# Patient Record
Sex: Female | Born: 1955 | Race: White | Hispanic: No | Marital: Married | Smoking: Never smoker
Health system: Southern US, Community
[De-identification: ages and names within clinical notes are randomized; demographics above are authoritative.]

## PROBLEM LIST (undated history)

## (undated) DIAGNOSIS — R51 Headache: Secondary | ICD-10-CM

## (undated) DIAGNOSIS — Z9889 Other specified postprocedural states: Secondary | ICD-10-CM

## (undated) DIAGNOSIS — F329 Major depressive disorder, single episode, unspecified: Secondary | ICD-10-CM

## (undated) DIAGNOSIS — F419 Anxiety disorder, unspecified: Secondary | ICD-10-CM

## (undated) DIAGNOSIS — J45909 Unspecified asthma, uncomplicated: Secondary | ICD-10-CM

## (undated) DIAGNOSIS — R112 Nausea with vomiting, unspecified: Secondary | ICD-10-CM

## (undated) DIAGNOSIS — G43909 Migraine, unspecified, not intractable, without status migrainosus: Secondary | ICD-10-CM

## (undated) DIAGNOSIS — F32A Depression, unspecified: Secondary | ICD-10-CM

## (undated) DIAGNOSIS — T7840XA Allergy, unspecified, initial encounter: Secondary | ICD-10-CM

## (undated) DIAGNOSIS — K219 Gastro-esophageal reflux disease without esophagitis: Secondary | ICD-10-CM

## (undated) DIAGNOSIS — M1990 Unspecified osteoarthritis, unspecified site: Secondary | ICD-10-CM

## (undated) DIAGNOSIS — R519 Headache, unspecified: Secondary | ICD-10-CM

## (undated) DIAGNOSIS — I10 Essential (primary) hypertension: Secondary | ICD-10-CM

## (undated) DIAGNOSIS — L4050 Arthropathic psoriasis, unspecified: Secondary | ICD-10-CM

## (undated) HISTORY — PX: TENDON REPAIR: SHX5111

## (undated) HISTORY — DX: Migraine, unspecified, not intractable, without status migrainosus: G43.909

## (undated) HISTORY — DX: Arthropathic psoriasis, unspecified: L40.50

## (undated) HISTORY — PX: EYE SURGERY: SHX253

## (undated) HISTORY — PX: JOINT REPLACEMENT: SHX530

## (undated) HISTORY — DX: Allergy, unspecified, initial encounter: T78.40XA

## (undated) HISTORY — DX: Headache, unspecified: R51.9

## (undated) HISTORY — DX: Unspecified osteoarthritis, unspecified site: M19.90

## (undated) HISTORY — DX: Anxiety disorder, unspecified: F41.9

## (undated) HISTORY — PX: TUBAL LIGATION: SHX77

## (undated) HISTORY — DX: Depression, unspecified: F32.A

## (undated) HISTORY — DX: Headache: R51

## (undated) HISTORY — DX: Major depressive disorder, single episode, unspecified: F32.9

## (undated) HISTORY — PX: BRAIN SURGERY: SHX531

---

## 1983-05-07 HISTORY — PX: TONSILLECTOMY AND ADENOIDECTOMY: SUR1326

## 1993-05-06 DIAGNOSIS — G919 Hydrocephalus, unspecified: Secondary | ICD-10-CM

## 1993-05-06 HISTORY — PX: VENTRICULAR ATRIAL SHUNT: SHX2657

## 1993-05-06 HISTORY — DX: Hydrocephalus, unspecified: G91.9

## 1996-05-06 HISTORY — PX: TYMPANOSTOMY TUBE PLACEMENT: SHX32

## 1996-05-06 HISTORY — PX: NASAL SINUS SURGERY: SHX719

## 1998-05-06 HISTORY — PX: BREAST SURGERY: SHX581

## 2011-07-01 DIAGNOSIS — Z87891 Personal history of nicotine dependence: Secondary | ICD-10-CM | POA: Diagnosis not present

## 2011-07-01 DIAGNOSIS — M25549 Pain in joints of unspecified hand: Secondary | ICD-10-CM | POA: Diagnosis not present

## 2011-07-01 DIAGNOSIS — R229 Localized swelling, mass and lump, unspecified: Secondary | ICD-10-CM | POA: Diagnosis not present

## 2011-07-01 DIAGNOSIS — M19049 Primary osteoarthritis, unspecified hand: Secondary | ICD-10-CM | POA: Diagnosis not present

## 2011-07-10 DIAGNOSIS — R229 Localized swelling, mass and lump, unspecified: Secondary | ICD-10-CM | POA: Diagnosis not present

## 2011-07-12 DIAGNOSIS — D481 Neoplasm of uncertain behavior of connective and other soft tissue: Secondary | ICD-10-CM | POA: Diagnosis not present

## 2011-07-12 DIAGNOSIS — R229 Localized swelling, mass and lump, unspecified: Secondary | ICD-10-CM | POA: Diagnosis not present

## 2011-07-12 DIAGNOSIS — M659 Synovitis and tenosynovitis, unspecified: Secondary | ICD-10-CM | POA: Diagnosis not present

## 2011-07-12 DIAGNOSIS — M65849 Other synovitis and tenosynovitis, unspecified hand: Secondary | ICD-10-CM | POA: Diagnosis not present

## 2011-07-12 DIAGNOSIS — Z79899 Other long term (current) drug therapy: Secondary | ICD-10-CM | POA: Diagnosis not present

## 2011-07-12 DIAGNOSIS — M65839 Other synovitis and tenosynovitis, unspecified forearm: Secondary | ICD-10-CM | POA: Diagnosis not present

## 2011-09-25 DIAGNOSIS — Z1231 Encounter for screening mammogram for malignant neoplasm of breast: Secondary | ICD-10-CM | POA: Diagnosis not present

## 2011-09-25 DIAGNOSIS — J309 Allergic rhinitis, unspecified: Secondary | ICD-10-CM | POA: Diagnosis not present

## 2011-09-25 DIAGNOSIS — Z79899 Other long term (current) drug therapy: Secondary | ICD-10-CM | POA: Diagnosis not present

## 2011-09-25 DIAGNOSIS — K219 Gastro-esophageal reflux disease without esophagitis: Secondary | ICD-10-CM | POA: Diagnosis not present

## 2011-09-25 DIAGNOSIS — F341 Dysthymic disorder: Secondary | ICD-10-CM | POA: Diagnosis not present

## 2011-09-25 DIAGNOSIS — E785 Hyperlipidemia, unspecified: Secondary | ICD-10-CM | POA: Diagnosis not present

## 2012-05-19 DIAGNOSIS — L4050 Arthropathic psoriasis, unspecified: Secondary | ICD-10-CM | POA: Diagnosis not present

## 2012-05-19 DIAGNOSIS — J328 Other chronic sinusitis: Secondary | ICD-10-CM | POA: Diagnosis not present

## 2012-08-12 DIAGNOSIS — S61209A Unspecified open wound of unspecified finger without damage to nail, initial encounter: Secondary | ICD-10-CM | POA: Diagnosis not present

## 2012-08-26 DIAGNOSIS — C753 Malignant neoplasm of pineal gland: Secondary | ICD-10-CM | POA: Diagnosis not present

## 2012-09-03 DIAGNOSIS — J019 Acute sinusitis, unspecified: Secondary | ICD-10-CM | POA: Diagnosis not present

## 2012-09-03 DIAGNOSIS — H7290 Unspecified perforation of tympanic membrane, unspecified ear: Secondary | ICD-10-CM | POA: Diagnosis not present

## 2012-09-16 DIAGNOSIS — J019 Acute sinusitis, unspecified: Secondary | ICD-10-CM | POA: Diagnosis not present

## 2012-09-16 DIAGNOSIS — H7290 Unspecified perforation of tympanic membrane, unspecified ear: Secondary | ICD-10-CM | POA: Diagnosis not present

## 2012-09-24 DIAGNOSIS — H7290 Unspecified perforation of tympanic membrane, unspecified ear: Secondary | ICD-10-CM | POA: Diagnosis not present

## 2012-09-24 DIAGNOSIS — J019 Acute sinusitis, unspecified: Secondary | ICD-10-CM | POA: Diagnosis not present

## 2012-10-06 DIAGNOSIS — Z0000 Encounter for general adult medical examination without abnormal findings: Secondary | ICD-10-CM | POA: Diagnosis not present

## 2012-10-06 DIAGNOSIS — J322 Chronic ethmoidal sinusitis: Secondary | ICD-10-CM | POA: Diagnosis not present

## 2012-10-06 DIAGNOSIS — H7200 Central perforation of tympanic membrane, unspecified ear: Secondary | ICD-10-CM | POA: Diagnosis not present

## 2012-10-12 DIAGNOSIS — F341 Dysthymic disorder: Secondary | ICD-10-CM | POA: Diagnosis not present

## 2012-11-05 DIAGNOSIS — S8010XA Contusion of unspecified lower leg, initial encounter: Secondary | ICD-10-CM | POA: Diagnosis not present

## 2012-11-05 DIAGNOSIS — W2203XA Walked into furniture, initial encounter: Secondary | ICD-10-CM | POA: Diagnosis not present

## 2012-11-30 DIAGNOSIS — L821 Other seborrheic keratosis: Secondary | ICD-10-CM | POA: Diagnosis not present

## 2012-11-30 DIAGNOSIS — L819 Disorder of pigmentation, unspecified: Secondary | ICD-10-CM | POA: Diagnosis not present

## 2013-02-11 ENCOUNTER — Ambulatory Visit (INDEPENDENT_AMBULATORY_CARE_PROVIDER_SITE_OTHER): Payer: Medicare Other | Admitting: Adult Health

## 2013-02-11 ENCOUNTER — Encounter (INDEPENDENT_AMBULATORY_CARE_PROVIDER_SITE_OTHER): Payer: Self-pay

## 2013-02-11 ENCOUNTER — Encounter: Payer: Self-pay | Admitting: Adult Health

## 2013-02-11 VITALS — BP 120/72 | HR 78 | Temp 98.4°F | Resp 12 | Ht 65.5 in | Wt 179.5 lb

## 2013-02-11 DIAGNOSIS — J329 Chronic sinusitis, unspecified: Secondary | ICD-10-CM | POA: Diagnosis not present

## 2013-02-11 DIAGNOSIS — R635 Abnormal weight gain: Secondary | ICD-10-CM | POA: Insufficient documentation

## 2013-02-11 DIAGNOSIS — Z1239 Encounter for other screening for malignant neoplasm of breast: Secondary | ICD-10-CM | POA: Insufficient documentation

## 2013-02-11 MED ORDER — PHENTERMINE HCL 37.5 MG PO CAPS: 37.5000 mg | ORAL_CAPSULE | ORAL | Status: DC

## 2013-02-11 MED ORDER — VALACYCLOVIR HCL 1 G PO TABS: 1000.0000 mg | ORAL_TABLET | Freq: Three times a day (TID) | ORAL | Status: DC | PRN

## 2013-02-11 NOTE — Assessment & Plan Note (Signed)
Mammogram ordered

## 2013-02-11 NOTE — Assessment & Plan Note (Signed)
Reports seasonal allergy. Not currently taking any antihistamines. She will try an over-the-counter medication such as Claritin, Allegra or Zyrtec.

## 2013-02-11 NOTE — Patient Instructions (Signed)
   Thank you for choosing [place] at [facility] for your health care needs.  I will request your medical records from your previous provider.  For weight loss assistance I have prescribed phentermine 37.5 mg tablet. Take this daily in the morning.  Return in 1 month prior to running out of medication.

## 2013-02-11 NOTE — Assessment & Plan Note (Signed)
Patient will like assistance with weight loss. Will start phentermine 37.5 mg. Return to clinic in one month for followup.

## 2013-02-11 NOTE — Progress Notes (Signed)
Subjective:    Patient ID: Michele Newton, female    DOB: [birth year], 57 y.o.   MRN: [id number]  HPI  Patient is a pleasant 57 y/o female who presents to clinic to establish care. Previously followed by Lazarus Gowda, PA. Will request records from previous provider. She reports recent weight gain and would like to start working towards getting it off. She also reports having problems with her sinuses. Feels that some of her symptoms are triggered by allergies. She is feeling well today.     Past Medical History  Diagnosis Date  . Arthritis   . Depression   . Frequent headaches   . Migraine      Past Surgical History  Procedure Laterality Date  . Breast surgery  2000    biopsy  . Tonsillectomy and adenoidectomy  1985  . Ventricular atrial shunt Right 1995  . Tympanostomy tube placement  1998  . Nasal sinus surgery  1998     Family History  Problem Relation Age of Onset  . Arthritis Mother   . Hyperlipidemia Mother   . Heart disease Mother 106    Died of heart failure  . Hypertension Mother   . Diabetes Mother   . Arthritis Father   . Cancer Father     prostate cancer  . Diabetes Brother      History   Social History  . Marital Status: Married    Spouse Name: Michele Newton    Number of Children: 2  . Years of Education: 14   Occupational History  .  Other   Social History Main Topics  . Smoking status: Never Smoker   . Smokeless tobacco: Never Used  . Alcohol Use: No  . Drug Use: No  . Sexual Activity: Yes   Other Topics Concern  . Not on file   Social History Narrative   Cristie lives at home with her husband, Michele Newton, of 5 months and a cat named, Michele Newton. She has 2 sons from a previous marriage. She has 4 grandchildren.      Review of Systems  Constitutional: Negative.   HENT: Positive for postnasal drip, rhinorrhea and sinus pressure.   Eyes: Negative.   Respiratory: Negative.   Cardiovascular: Negative.   Gastrointestinal: Negative.   Endocrine: Negative.    Genitourinary: Negative.   Musculoskeletal: Negative.   Skin: Negative.   Allergic/Immunologic: Negative.   Neurological: Positive for headaches.  Hematological: Negative.   Psychiatric/Behavioral: Negative.        Objective:   Physical Exam  Constitutional: She is oriented to person, place, and time. She appears well-developed and well-nourished. No distress.  HENT:  Head: Normocephalic and atraumatic.  Right Ear: External ear normal.  Left Ear: External ear normal.  Nose: Nose normal.  Mouth/Throat: Oropharynx is clear and moist.  Eyes: Conjunctivae and EOM are normal. Pupils are equal, round, and reactive to light.  Neck: Normal range of motion. Neck supple. No tracheal deviation present. No thyromegaly present.  Cardiovascular: Normal rate, regular rhythm, normal heart sounds and intact distal pulses.  Exam reveals no gallop and no friction rub.   No murmur heard. Pulmonary/Chest: Effort normal and breath sounds normal. No respiratory distress. She has no wheezes. She has no rales.  Abdominal: Soft. Bowel sounds are normal. She exhibits no distension and no mass. There is no tenderness. There is no rebound and no guarding.  Musculoskeletal: Normal range of motion. She exhibits no edema and no tenderness.  Lymphadenopathy:    She has no  cervical adenopathy.  Neurological: She is alert and oriented to person, place, and time. She has normal reflexes. No cranial nerve deficit. Coordination normal.  Skin: Skin is warm and dry.  Psychiatric: She has a normal mood and affect. Her behavior is normal. Judgment and thought content normal.    BP 120/72  Pulse 78  Temp(Src) 98.4 F (36.9 C) (Oral)  Resp 12  Ht 5' 5.5" (1.664 m)  Wt 179 lb 8 oz (81.421 kg)  BMI 29.41 kg/m2  SpO2 96%       Assessment & Plan:

## 2013-03-11 ENCOUNTER — Other Ambulatory Visit: Payer: Self-pay

## 2013-03-12 ENCOUNTER — Other Ambulatory Visit: Payer: Self-pay | Admitting: *Deleted

## 2013-03-12 MED ORDER — DESVENLAFAXINE SUCCINATE ER 100 MG PO TB24: 100.0000 mg | ORAL_TABLET | Freq: Every day | ORAL | Status: DC

## 2013-03-16 ENCOUNTER — Ambulatory Visit: Payer: Medicare Other | Admitting: Adult Health

## 2013-04-16 ENCOUNTER — Ambulatory Visit (INDEPENDENT_AMBULATORY_CARE_PROVIDER_SITE_OTHER): Payer: Medicare Other | Admitting: Internal Medicine

## 2013-04-16 ENCOUNTER — Encounter: Payer: Self-pay | Admitting: Internal Medicine

## 2013-04-16 VITALS — BP 126/80 | HR 112 | Temp 98.4°F | Wt 182.0 lb

## 2013-04-16 DIAGNOSIS — H6691 Otitis media, unspecified, right ear: Secondary | ICD-10-CM | POA: Insufficient documentation

## 2013-04-16 DIAGNOSIS — H6690 Otitis media, unspecified, unspecified ear: Secondary | ICD-10-CM

## 2013-04-16 DIAGNOSIS — J069 Acute upper respiratory infection, unspecified: Secondary | ICD-10-CM | POA: Diagnosis not present

## 2013-04-16 MED ORDER — BENZONATATE 200 MG PO CAPS: 200.0000 mg | ORAL_CAPSULE | Freq: Two times a day (BID) | ORAL | Status: DC | PRN

## 2013-04-16 MED ORDER — AMOXICILLIN-POT CLAVULANATE 875-125 MG PO TABS: 1.0000 | ORAL_TABLET | Freq: Two times a day (BID) | ORAL | Status: DC

## 2013-04-16 NOTE — Assessment & Plan Note (Signed)
Symptoms consistent with viral upper respiratory infection with cough. Encouraged rest and adequate fluid intake. Encouraged use Tessalon as needed for cough. Followup if symptoms are not improving.

## 2013-04-16 NOTE — Assessment & Plan Note (Signed)
Exam consistent with right otitis media with a small anterior perforation. Will treat with Augmentin. Patient will call if persistent symptoms of ear pain or fever.

## 2013-04-16 NOTE — Progress Notes (Signed)
Pre visit review using our clinic review tool, if applicable. No additional management support is needed unless otherwise documented below in the visit note. 

## 2013-04-16 NOTE — Progress Notes (Signed)
Subjective:    Patient ID: Michele Newton, female    DOB: January 04, 1956, 57 y.o.   MRN: [id number]  HPI 57 year old female presents for acute visit complaining of one-week history of cough, nasal congestion. Symptoms began last week with clear nasal drainage, general malaise, subjective fever and chills, and nonproductive cough. Symptoms have gradually improved over the last week however she does have some residual dry cough. She denies shortness of breath or recurrent fever. She denies chest pain. She notes some right ear pain. In the past, she was told she has a perforation in her right eardrum. She denies drainage from her ear. She is not currently taking any medications for symptoms.  Outpatient Encounter Prescriptions as of 04/16/2013  Medication Sig  . desvenlafaxine (PRISTIQ) 100 MG 24 hr tablet Take 1 tablet (100 mg total) by mouth daily.  . Multiple Vitamins-Minerals (WOMENS MULTIVITAMIN PLUS PO) Take by mouth.  . phentermine 37.5 MG capsule Take 1 capsule (37.5 mg total) by mouth every morning.  Marland Kitchen QUEtiapine (SEROQUEL) 200 MG tablet Take 200 mg by mouth at bedtime.  . valACYclovir (VALTREX) 1000 MG tablet Take 1 tablet (1,000 mg total) by mouth 3 (three) times daily as needed. X 7 days prn   BP 126/80  Pulse 112  Temp(Src) 98.4 F (36.9 C) (Oral)  Wt 182 lb (82.555 kg)  SpO2 97%  Review of Systems  Constitutional: Positive for fatigue. Negative for fever, chills, appetite change and unexpected weight change.  HENT: Positive for congestion, ear pain, postnasal drip and rhinorrhea. Negative for ear discharge, sinus pressure, sore throat, trouble swallowing and voice change.   Eyes: Negative for visual disturbance.  Respiratory: Positive for cough. Negative for shortness of breath, wheezing and stridor.   Cardiovascular: Negative for chest pain, palpitations and leg swelling.  Gastrointestinal: Negative for nausea, vomiting, abdominal pain, diarrhea, constipation, blood in stool,  abdominal distention and anal bleeding.  Genitourinary: Negative for dysuria and flank pain.  Musculoskeletal: Negative for arthralgias, gait problem, myalgias and neck pain.  Skin: Negative for color change and rash.  Neurological: Negative for dizziness and headaches.  Hematological: Negative for adenopathy. Does not bruise/bleed easily.  Psychiatric/Behavioral: Negative for suicidal ideas, sleep disturbance and dysphoric mood. The patient is not nervous/anxious.        Objective:   Physical Exam  Constitutional: She is oriented to person, place, and time. She appears well-developed and well-nourished. No distress.  HENT:  Head: Normocephalic and atraumatic.  Right Ear: External ear normal. A middle ear effusion is present.  Left Ear: Tympanic membrane, external ear and ear canal normal.  Ears:  Nose: Nose normal.  Mouth/Throat: Oropharynx is clear and moist. No oropharyngeal exudate.  Eyes: Conjunctivae are normal. Pupils are equal, round, and reactive to light. Right eye exhibits no discharge. Left eye exhibits no discharge. No scleral icterus.  Neck: Normal range of motion. Neck supple. No tracheal deviation present. No thyromegaly present.  Cardiovascular: Normal rate, regular rhythm, normal heart sounds and intact distal pulses.  Exam reveals no gallop and no friction rub.   No murmur heard. Pulmonary/Chest: Effort normal and breath sounds normal. No accessory muscle usage. Not tachypneic. No respiratory distress. She has no decreased breath sounds. She has no wheezes. She has no rhonchi. She has no rales. She exhibits no tenderness.  Musculoskeletal: Normal range of motion. She exhibits no edema and no tenderness.  Lymphadenopathy:    She has no cervical adenopathy.  Neurological: She is alert and oriented to person, place,  and time. No cranial nerve deficit. She exhibits normal muscle tone. Coordination normal.  Skin: Skin is warm and dry. No rash noted. She is not diaphoretic.  No erythema. No pallor.  Psychiatric: She has a normal mood and affect. Her behavior is normal. Judgment and thought content normal.          Assessment & Plan:

## 2013-04-22 ENCOUNTER — Telehealth: Payer: Self-pay | Admitting: Adult Health

## 2013-04-22 NOTE — Telephone Encounter (Signed)
QUEtiapine (SEROQUEL) 200 MG tablet  The patient is needing the prescription before the weekend.

## 2013-04-23 NOTE — Telephone Encounter (Signed)
Patient should contact her pharmacy

## 2013-04-26 MED ORDER — QUETIAPINE FUMARATE 200 MG PO TABS: 200.0000 mg | ORAL_TABLET | Freq: Every day | ORAL | Status: DC

## 2013-04-26 NOTE — Telephone Encounter (Signed)
Rx sent to the pharmacy.

## 2013-04-27 ENCOUNTER — Ambulatory Visit (INDEPENDENT_AMBULATORY_CARE_PROVIDER_SITE_OTHER): Payer: Medicare Other | Admitting: Internal Medicine

## 2013-04-27 ENCOUNTER — Encounter: Payer: Self-pay | Admitting: Internal Medicine

## 2013-04-27 VITALS — BP 120/86 | HR 97 | Temp 98.4°F | Wt 181.0 lb

## 2013-04-27 DIAGNOSIS — H6691 Otitis media, unspecified, right ear: Secondary | ICD-10-CM

## 2013-04-27 DIAGNOSIS — H6690 Otitis media, unspecified, unspecified ear: Secondary | ICD-10-CM

## 2013-04-27 DIAGNOSIS — J209 Acute bronchitis, unspecified: Secondary | ICD-10-CM | POA: Diagnosis not present

## 2013-04-27 DIAGNOSIS — J312 Chronic pharyngitis: Secondary | ICD-10-CM

## 2013-04-27 MED ORDER — DOXYCYCLINE HYCLATE 100 MG PO TABS: 100.0000 mg | ORAL_TABLET | Freq: Two times a day (BID) | ORAL | Status: DC

## 2013-04-27 MED ORDER — PREDNISONE (PAK) 10 MG PO TABS: ORAL_TABLET | ORAL | Status: DC

## 2013-04-27 NOTE — Assessment & Plan Note (Addendum)
Symptoms and exam are consistent with bronchitis. No improvement after recent course of augmentin. Will start Doxycycline. Start prednisone taper. Follow up for recheck in 2 weeks or sooner if needed. Continue Tessalon as needed for cough.

## 2013-04-27 NOTE — Assessment & Plan Note (Signed)
Right ear exam slightly improved after course of Augmentin, however some persistent erythema/inflammation. Will start course of prednisone and doxycycine. If no improvement, would favor ENT evaluation.

## 2013-04-27 NOTE — Progress Notes (Signed)
Pre-visit discussion using our clinic review tool. No additional management support is needed unless otherwise documented below in the visit note.  

## 2013-04-27 NOTE — Progress Notes (Signed)
Subjective:    Patient ID: Michele Newton, female    DOB: 1955/08/13, 57 y.o.   MRN: [id number]  HPI 57YO female with h/o sinusitis presents for acute visit. Seen initially 12/12 diagnosed with right OM. Treated with augmentin. Symptoms of ear pain improved, however over last few days, she has developed recurrent ear pain, sinus congestion with purulent drainage, sinus pressure and cough. Occasional mild dyspnea. No chest pain. Chills, but no fever noted.  Outpatient Encounter Prescriptions as of 04/27/2013  Medication Sig  . benzonatate (TESSALON) 200 MG capsule Take 1 capsule (200 mg total) by mouth 2 (two) times daily as needed for cough.  . desvenlafaxine (PRISTIQ) 100 MG 24 hr tablet Take 1 tablet (100 mg total) by mouth daily.  Marland Kitchen DEXILANT 60 MG capsule   . Multiple Vitamins-Minerals (WOMENS MULTIVITAMIN PLUS PO) Take by mouth.  . QUEtiapine (SEROQUEL) 200 MG tablet Take 1 tablet (200 mg total) by mouth at bedtime.  . valACYclovir (VALTREX) 1000 MG tablet Take 1 tablet (1,000 mg total) by mouth 3 (three) times daily as needed. X 7 days prn  . [DISCONTINUED] amoxicillin-clavulanate (AUGMENTIN) 875-125 MG per tablet Take 1 tablet by mouth 2 (two) times daily.  . phentermine 37.5 MG capsule Take 1 capsule (37.5 mg total) by mouth every morning.   BP 120/86  Pulse 97  Temp(Src) 98.4 F (36.9 C) (Oral)  Wt 181 lb (82.101 kg)  SpO2 97%  Review of Systems  Constitutional: Positive for chills and fatigue. Negative for fever and unexpected weight change.  HENT: Positive for congestion, ear pain, postnasal drip, rhinorrhea and sinus pressure. Negative for ear discharge, facial swelling, hearing loss, mouth sores, nosebleeds, sneezing, sore throat, tinnitus, trouble swallowing and voice change.   Eyes: Negative for pain, discharge, redness and visual disturbance.  Respiratory: Positive for cough and shortness of breath. Negative for chest tightness, wheezing and stridor.   Cardiovascular:  Negative for chest pain, palpitations and leg swelling.  Musculoskeletal: Negative for arthralgias, myalgias, neck pain and neck stiffness.  Skin: Negative for color change and rash.  Neurological: Negative for dizziness, weakness, light-headedness and headaches.  Hematological: Negative for adenopathy.       Objective:   Physical Exam  Constitutional: She is oriented to person, place, and time. She appears well-developed and well-nourished. No distress.  HENT:  Head: Normocephalic and atraumatic.  Right Ear: External ear normal. Tympanic membrane is perforated and erythematous.  Left Ear: External ear normal. Tympanic membrane is erythematous and bulging. A middle ear effusion is present.  Nose: Nose normal.  Mouth/Throat: Oropharynx is clear and moist. No oropharyngeal exudate.  Eyes: Conjunctivae are normal. Pupils are equal, round, and reactive to light. Right eye exhibits no discharge. Left eye exhibits no discharge. No scleral icterus.  Neck: Normal range of motion. Neck supple. No tracheal deviation present. No thyromegaly present.  Cardiovascular: Normal rate, regular rhythm, normal heart sounds and intact distal pulses.  Exam reveals no gallop and no friction rub.   No murmur heard. Pulmonary/Chest: Effort normal. No accessory muscle usage. Not tachypneic. No respiratory distress. She has no decreased breath sounds. She has no wheezes. She has rhonchi (scattered). She has no rales. She exhibits no tenderness.  Musculoskeletal: Normal range of motion. She exhibits no edema and no tenderness.  Lymphadenopathy:    She has no cervical adenopathy.  Neurological: She is alert and oriented to person, place, and time. No cranial nerve deficit. She exhibits normal muscle tone. Coordination normal.  Skin: Skin is  warm and dry. No rash noted. She is not diaphoretic. No erythema. No pallor.  Psychiatric: She has a normal mood and affect. Her behavior is normal. Judgment and thought content  normal.          Assessment & Plan:

## 2013-05-05 ENCOUNTER — Ambulatory Visit (INDEPENDENT_AMBULATORY_CARE_PROVIDER_SITE_OTHER): Payer: Medicare Other | Admitting: Adult Health

## 2013-05-05 ENCOUNTER — Encounter: Payer: Self-pay | Admitting: Adult Health

## 2013-05-05 ENCOUNTER — Ambulatory Visit (INDEPENDENT_AMBULATORY_CARE_PROVIDER_SITE_OTHER)
Admission: RE | Admit: 2013-05-05 | Discharge: 2013-05-05 | Disposition: A | Discharge status: Home / Self Care | Payer: Medicare Other | Source: Ambulatory Visit | Attending: Adult Health | Admitting: Adult Health

## 2013-05-05 VITALS — BP 110/80 | HR 99 | Temp 98.3°F | Resp 12 | Wt 183.0 lb

## 2013-05-05 DIAGNOSIS — J209 Acute bronchitis, unspecified: Secondary | ICD-10-CM

## 2013-05-05 DIAGNOSIS — R05 Cough: Secondary | ICD-10-CM | POA: Diagnosis not present

## 2013-05-05 DIAGNOSIS — R0602 Shortness of breath: Secondary | ICD-10-CM

## 2013-05-05 MED ORDER — MOMETASONE FUROATE 50 MCG/ACT NA SUSP: 2.0000 | Freq: Every day | NASAL | Status: DC

## 2013-05-05 NOTE — Patient Instructions (Signed)
  Please go to our [facility] office for your xray.  Continue Doxycycline.  I will call you once results of xray are available.

## 2013-05-05 NOTE — Progress Notes (Signed)
Pre visit review using our clinic review tool, if applicable. No additional management support is needed unless otherwise documented below in the visit note. 

## 2013-05-05 NOTE — Assessment & Plan Note (Signed)
Some improvement in cough. Concerned about her reports of shortness of breath and night sweats. Send for chest xray. Continue doxy.

## 2013-05-05 NOTE — Progress Notes (Signed)
   Subjective:    Patient ID: Michele Newton, female    DOB: Nov 23, 1955, 57 y.o.   MRN: [id number]  HPI  Patient presents to clinic with cough, fatigue, yellow mucus from nose, ear pressure, night sweats, chills. She was last seen on 04/27/13 and started on doxy and prednisone taper. Completed prednisone taper. Still taking antibiotic. She reports shortness of breath, wheezing.   Current Outpatient Prescriptions on File Prior to Visit  Medication Sig Dispense Refill  . benzonatate (TESSALON) 200 MG capsule Take 1 capsule (200 mg total) by mouth 2 (two) times daily as needed for cough.  40 capsule  0  . desvenlafaxine (PRISTIQ) 100 MG 24 hr tablet Take 1 tablet (100 mg total) by mouth daily.  30 tablet  2  . DEXILANT 60 MG capsule       . doxycycline (VIBRA-TABS) 100 MG tablet Take 1 tablet (100 mg total) by mouth 2 (two) times daily.  20 tablet  0  . Multiple Vitamins-Minerals (WOMENS MULTIVITAMIN PLUS PO) Take by mouth.      . phentermine 37.5 MG capsule Take 1 capsule (37.5 mg total) by mouth every morning.  30 capsule  0  . QUEtiapine (SEROQUEL) 200 MG tablet Take 1 tablet (200 mg total) by mouth at bedtime.  30 tablet  2  . valACYclovir (VALTREX) 1000 MG tablet Take 1 tablet (1,000 mg total) by mouth 3 (three) times daily as needed. X 7 days prn  30 tablet  6   No current facility-administered medications on file prior to visit.     Review of Systems  Constitutional: Positive for chills and fatigue.  HENT: Positive for congestion, postnasal drip and sinus pressure.        Ear pressure, sinus drainage  Respiratory: Positive for cough, shortness of breath and wheezing.   Cardiovascular: Positive for palpitations.       Objective:   Physical Exam  Constitutional: She is oriented to person, place, and time. She appears well-developed and well-nourished. No distress.  HENT:  Head: Normocephalic and atraumatic.  Left Ear: External ear normal.  Mouth/Throat: No oropharyngeal exudate.    Right ear TM erythematous. Scar noted from previous perforation  Cardiovascular: Normal rate, regular rhythm and normal heart sounds.  Exam reveals no gallop and no friction rub.   No murmur heard. Pulmonary/Chest: Effort normal and breath sounds normal. No respiratory distress. She has no wheezes. She has no rales.  Lymphadenopathy:    She has no cervical adenopathy.  Neurological: She is alert and oriented to person, place, and time.  Skin: Skin is warm and dry.  Psychiatric: She has a normal mood and affect. Her behavior is normal. Judgment and thought content normal.    BP 110/80  Pulse 99  Temp(Src) 98.3 F (36.8 C) (Oral)  Resp 12  Wt 183 lb (83.008 kg)  SpO2 97%       Assessment & Plan:

## 2013-05-07 NOTE — Telephone Encounter (Signed)
Mailed unread message to pt  

## 2013-05-10 ENCOUNTER — Ambulatory Visit: Payer: Medicare Other | Admitting: Internal Medicine

## 2013-05-13 ENCOUNTER — Other Ambulatory Visit: Payer: Self-pay | Admitting: *Deleted

## 2013-05-13 MED ORDER — DESVENLAFAXINE SUCCINATE ER 100 MG PO TB24: 100.0000 mg | ORAL_TABLET | Freq: Every day | ORAL | Status: DC

## 2013-06-11 ENCOUNTER — Telehealth: Payer: Self-pay | Admitting: Adult Health

## 2013-06-11 ENCOUNTER — Encounter: Payer: Self-pay | Admitting: Adult Health

## 2013-06-11 ENCOUNTER — Ambulatory Visit (INDEPENDENT_AMBULATORY_CARE_PROVIDER_SITE_OTHER): Payer: Medicare Other | Admitting: Adult Health

## 2013-06-11 VITALS — BP 122/76 | HR 84 | Temp 98.1°F | Resp 12 | Wt 184.5 lb

## 2013-06-11 DIAGNOSIS — H6690 Otitis media, unspecified, unspecified ear: Secondary | ICD-10-CM

## 2013-06-11 DIAGNOSIS — H6691 Otitis media, unspecified, right ear: Secondary | ICD-10-CM

## 2013-06-11 DIAGNOSIS — F32A Depression, unspecified: Secondary | ICD-10-CM | POA: Insufficient documentation

## 2013-06-11 DIAGNOSIS — F411 Generalized anxiety disorder: Secondary | ICD-10-CM

## 2013-06-11 DIAGNOSIS — F329 Major depressive disorder, single episode, unspecified: Secondary | ICD-10-CM | POA: Insufficient documentation

## 2013-06-11 DIAGNOSIS — F419 Anxiety disorder, unspecified: Secondary | ICD-10-CM | POA: Insufficient documentation

## 2013-06-11 MED ORDER — QUETIAPINE FUMARATE 200 MG PO TABS: 200.0000 mg | ORAL_TABLET | Freq: Every day | ORAL | Status: DC

## 2013-06-11 MED ORDER — AMOXICILLIN-POT CLAVULANATE 875-125 MG PO TABS: 1.0000 | ORAL_TABLET | Freq: Two times a day (BID) | ORAL | Status: DC

## 2013-06-11 MED ORDER — DESVENLAFAXINE SUCCINATE ER 100 MG PO TB24: 100.0000 mg | ORAL_TABLET | Freq: Every day | ORAL | Status: DC

## 2013-06-11 MED ORDER — MOMETASONE FUROATE 50 MCG/ACT NA SUSP: 2.0000 | Freq: Every day | NASAL | Status: DC

## 2013-06-11 MED ORDER — BUSPIRONE HCL 10 MG PO TABS: 10.0000 mg | ORAL_TABLET | Freq: Two times a day (BID) | ORAL | Status: DC

## 2013-06-11 MED ORDER — PAROXETINE HCL 10 MG PO TABS: 10.0000 mg | ORAL_TABLET | Freq: Every day | ORAL | Status: DC

## 2013-06-11 NOTE — Telephone Encounter (Signed)
The patient called to see if she is to continue to take the desvenlafaxine (PRISTIQ) 100 MG 24 hr tablet   or should she discontinue the medication . If she is to continue this medication she will need a prescription called to the pharmacy. She is wanting an answer today.

## 2013-06-11 NOTE — Progress Notes (Signed)
Pre visit review using our clinic review tool, if applicable. No additional management support is needed unless otherwise documented below in the visit note. 

## 2013-06-11 NOTE — Progress Notes (Signed)
Patient ID: Michele Newton, female   DOB: Nov 23, 1955, 58 y.o.   MRN: [id number]    Subjective:    Patient ID: Michele Newton, female    DOB: [birth year], 58 y.o.   MRN: [id number]  HPI  Pt is a pleasant 58 year old female who presents to clinic with ongoing sinus issues and right ear pain. She has been on antibiotics previously but her symptoms never fully resolved. She reports ear pain switching between the left and the right ear. She also feels pressure within her ears and sometimes a sensation of fluid. She has not been using her Nasonex. Reports postnasal drip which causes irritation and a cough. Denies fever, shortness of breath.  In addition, patient reports feeling constant anxiety. She describes herself as "always being on the edge". She finds herself getting very irritated and lashing out at people, mainly her family. She has been on Seroquel and Pristic for years. She does not feel depressed.    Past Medical History  Diagnosis Date  . Arthritis   . Depression   . Frequent headaches   . Migraine     Current Outpatient Prescriptions on File Prior to Visit  Medication Sig Dispense Refill  . benzonatate (TESSALON) 200 MG capsule Take 1 capsule (200 mg total) by mouth 2 (two) times daily as needed for cough.  40 capsule  0  . desvenlafaxine (PRISTIQ) 100 MG 24 hr tablet Take 1 tablet (100 mg total) by mouth daily.  30 tablet  2  . DEXILANT 60 MG capsule       . mometasone (NASONEX) 50 MCG/ACT nasal spray Place 2 sprays into the nose daily.  17 g  0  . Multiple Vitamins-Minerals (WOMENS MULTIVITAMIN PLUS PO) Take by mouth.      . QUEtiapine (SEROQUEL) 200 MG tablet Take 1 tablet (200 mg total) by mouth at bedtime.  30 tablet  2  . valACYclovir (VALTREX) 1000 MG tablet Take 1 tablet (1,000 mg total) by mouth 3 (three) times daily as needed. X 7 days prn  30 tablet  6   No current facility-administered medications on file prior to visit.     Review of Systems  Constitutional: Negative  for fever and chills.  HENT: Positive for congestion and postnasal drip. Negative for sore throat.        Ear pain changes from left to right   Respiratory: Positive for cough.   Musculoskeletal:       Swollen and painful right hand s/p injury  Psychiatric/Behavioral: Positive for behavioral problems and agitation. Negative for hallucinations, confusion, sleep disturbance, self-injury, dysphoric mood and decreased concentration. The patient is nervous/anxious.   All other systems reviewed and are negative.       Objective:  BP 122/76  Pulse 84  Temp(Src) 98.1 F (36.7 C) (Oral)  Resp 12  Wt 184 lb 8 oz (83.689 kg)  SpO2 98%   Physical Exam  Constitutional: She is oriented to person, place, and time. She appears well-developed and well-nourished. No distress.  HENT:  Head: Normocephalic and atraumatic.  Left Ear: External ear normal.  Right ear with perforated eardrum. Erythema. No drainage. Pharyngeal erythema  Cardiovascular: Normal rate, regular rhythm and normal heart sounds.   Pulmonary/Chest: Effort normal and breath sounds normal. No respiratory distress. She has no wheezes. She has no rales.  Musculoskeletal: Normal range of motion.  Neurological: She is alert and oriented to person, place, and time.  Skin: Skin is warm and dry.  Psychiatric: She has a normal  mood and affect. Her behavior is normal. Judgment and thought content normal.        Assessment & Plan:   1. Anxiety Start Buspar. Will change pristiq to SSRI that will help with her anxiety. Contacted pharmacist, Arbie Cookey with [facility], for weaning and crossover with medication. We will start Paxil 10 mg while weaning Pristiq QOD, then Q3days, Q 4days, etc. See below  Pristiq 100 ER QOD x 2 Pristiq 100 ER Q3D x 2 Pristiq 100 ER Q4D x 2 Then 1 tablet in 5 days and increase Paxil to 20 mg daily Stop Pristiq Spoke with patient and explained details. She verbalized understanding   F/U 1 month.  2. Right  otitis media Augmentin bid x 14 days. Continue with nasonex for her sinus pressure.

## 2013-06-11 NOTE — Patient Instructions (Signed)
  Start Augmentin twice a day for 14 days.  Please use the nasonex daily - 2 sprays into each nostril.  I am also sending in a prescription for buspar to help with anxiety.

## 2013-06-29 ENCOUNTER — Ambulatory Visit: Payer: Medicare Other | Admitting: Adult Health

## 2013-07-01 ENCOUNTER — Ambulatory Visit: Payer: Medicare Other | Admitting: Adult Health

## 2013-07-05 ENCOUNTER — Ambulatory Visit: Payer: Medicare Other | Admitting: Adult Health

## 2013-07-07 ENCOUNTER — Ambulatory Visit: Payer: Medicare Other | Admitting: Adult Health

## 2013-07-09 ENCOUNTER — Ambulatory Visit: Payer: Medicare Other | Admitting: Adult Health

## 2013-07-26 ENCOUNTER — Ambulatory Visit: Payer: Medicare Other | Admitting: Adult Health

## 2013-07-27 ENCOUNTER — Ambulatory Visit (INDEPENDENT_AMBULATORY_CARE_PROVIDER_SITE_OTHER): Payer: Medicare Other | Admitting: Adult Health

## 2013-07-27 ENCOUNTER — Encounter: Payer: Self-pay | Admitting: Adult Health

## 2013-07-27 ENCOUNTER — Ambulatory Visit (INDEPENDENT_AMBULATORY_CARE_PROVIDER_SITE_OTHER)
Admission: RE | Admit: 2013-07-27 | Discharge: 2013-07-27 | Disposition: A | Discharge status: Home / Self Care | Payer: Medicare Other | Source: Ambulatory Visit | Attending: Adult Health | Admitting: Adult Health

## 2013-07-27 ENCOUNTER — Other Ambulatory Visit: Payer: Self-pay | Admitting: Adult Health

## 2013-07-27 ENCOUNTER — Telehealth: Payer: Self-pay | Admitting: Adult Health

## 2013-07-27 VITALS — BP 106/78 | HR 75 | Temp 98.2°F | Resp 14 | Wt 185.0 lb

## 2013-07-27 DIAGNOSIS — M25569 Pain in unspecified knee: Secondary | ICD-10-CM

## 2013-07-27 DIAGNOSIS — M1710 Unilateral primary osteoarthritis, unspecified knee: Secondary | ICD-10-CM | POA: Diagnosis not present

## 2013-07-27 DIAGNOSIS — L4050 Arthropathic psoriasis, unspecified: Secondary | ICD-10-CM | POA: Diagnosis not present

## 2013-07-27 DIAGNOSIS — M25562 Pain in left knee: Secondary | ICD-10-CM

## 2013-07-27 DIAGNOSIS — M25559 Pain in unspecified hip: Secondary | ICD-10-CM

## 2013-07-27 DIAGNOSIS — M25552 Pain in left hip: Secondary | ICD-10-CM | POA: Insufficient documentation

## 2013-07-27 DIAGNOSIS — M549 Dorsalgia, unspecified: Secondary | ICD-10-CM | POA: Insufficient documentation

## 2013-07-27 DIAGNOSIS — IMO0002 Reserved for concepts with insufficient information to code with codable children: Secondary | ICD-10-CM | POA: Diagnosis not present

## 2013-07-27 MED ORDER — HYDROCODONE-ACETAMINOPHEN 5-325 MG PO TABS: 1.0000 | ORAL_TABLET | Freq: Four times a day (QID) | ORAL | Status: DC | PRN

## 2013-07-27 MED ORDER — DEXLANSOPRAZOLE 60 MG PO CPDR: 60.0000 mg | DELAYED_RELEASE_CAPSULE | Freq: Every day | ORAL | Status: DC

## 2013-07-27 NOTE — Progress Notes (Signed)
Patient ID: Michele Newton, female   DOB: Sep 13, 1955, 58 y.o.   MRN: [id number]    Subjective:    Patient ID: Michele Newton, female    DOB: Dec 14, 1955, 58 y.o.   MRN: [id number]  HPI  Patient is a pleasant 58 year old female who presents to clinic with left hip pain and left knee pain. She has a history of psoriatic arthritis as well as osteoarthritis. She reports that she has not seen a rheumatologist in several years. She was previously followed in [facility]. She is having trouble ambulating. Uncertain if her left knee is causing her left hip pain or vice versa. She is having trouble sleeping secondary to pain.   Past Medical History  Diagnosis Date  . Arthritis   . Depression   . Frequent headaches   . Migraine     Current Outpatient Prescriptions on File Prior to Visit  Medication Sig Dispense Refill  . busPIRone (BUSPAR) 10 MG tablet Take 1 tablet (10 mg total) by mouth 2 (two) times daily.  60 tablet  3  . DEXILANT 60 MG capsule       . mometasone (NASONEX) 50 MCG/ACT nasal spray Place 2 sprays into the nose daily.  17 g  6  . Multiple Vitamins-Minerals (WOMENS MULTIVITAMIN PLUS PO) Take by mouth.      Marland Kitchen PARoxetine (PAXIL) 10 MG tablet Take 1 tablet (10 mg total) by mouth daily.  30 tablet  1  . QUEtiapine (SEROQUEL) 200 MG tablet Take 1 tablet (200 mg total) by mouth at bedtime.  30 tablet  5  . valACYclovir (VALTREX) 1000 MG tablet Take 1 tablet (1,000 mg total) by mouth 3 (three) times daily as needed. X 7 days prn  30 tablet  6   No current facility-administered medications on file prior to visit.     Review of Systems  Musculoskeletal: Positive for arthralgias, back pain and joint swelling.       Left hip pain  Psychiatric/Behavioral: Positive for sleep disturbance (due to pain).  All other systems reviewed and are negative.       Objective:  BP 106/78  Pulse 75  Temp(Src) 98.2 F (36.8 C) (Oral)  Resp 14  Wt 185 lb (83.915 kg)  SpO2 96%   Physical  Exam  Constitutional: She is oriented to person, place, and time.  Appears uncomfortable  Cardiovascular: Normal rate and regular rhythm.   Pulmonary/Chest: Effort normal. No respiratory distress.  Musculoskeletal: She exhibits edema and tenderness.  Limping  Neurological: She is alert and oriented to person, place, and time.  Skin: Skin is warm and dry.  Psychiatric: She has a normal mood and affect. Her behavior is normal. Judgment and thought content normal.       Assessment & Plan:   1. Hip pain, left Will have xray to r/o bony abnormality. Norco for pain. Refer as indicated. - DG Hip Complete Left; Future  2. Left knee pain Uncertain if knee pain is referred from hip or vice versa. Will obtain plain films of knee. Refer as indicated. - DG Knee Complete 4 Views Left; Future  3. Psoriatic arthritis H/O psoriatic arthritis. She was followed by Rheumatologist in [place]. Has not established care with local Rheumatologist. Refer.

## 2013-07-27 NOTE — Telephone Encounter (Signed)
Pt left message she was returning your call

## 2013-07-27 NOTE — Progress Notes (Signed)
Pre visit review using our clinic review tool, if applicable. No additional management support is needed unless otherwise documented below in the visit note. 

## 2013-07-28 ENCOUNTER — Telehealth: Payer: Self-pay | Admitting: Emergency Medicine

## 2013-07-28 ENCOUNTER — Other Ambulatory Visit: Payer: Self-pay | Admitting: Adult Health

## 2013-07-28 MED ORDER — OXYCODONE-ACETAMINOPHEN 5-325 MG PO TABS: 1.0000 | ORAL_TABLET | Freq: Three times a day (TID) | ORAL | Status: DC | PRN

## 2013-07-28 NOTE — Telephone Encounter (Signed)
I will write a prescription for percocet. Cannot call in medication

## 2013-07-28 NOTE — Telephone Encounter (Signed)
Pt aware of apt scheduled.

## 2013-07-28 NOTE — Telephone Encounter (Signed)
Notified pt that Rx is ready to pick up

## 2013-07-28 NOTE — Telephone Encounter (Signed)
Pt wanted to make [place] aware that the Vicodin makes her itch. She didn't realize that it was hydrocodone. She is wondering if we can call in percocet instead. Please advise

## 2013-07-30 DIAGNOSIS — L4050 Arthropathic psoriasis, unspecified: Secondary | ICD-10-CM | POA: Diagnosis not present

## 2013-07-30 DIAGNOSIS — IMO0002 Reserved for concepts with insufficient information to code with codable children: Secondary | ICD-10-CM | POA: Diagnosis not present

## 2013-07-30 DIAGNOSIS — M1710 Unilateral primary osteoarthritis, unspecified knee: Secondary | ICD-10-CM | POA: Diagnosis not present

## 2013-07-30 DIAGNOSIS — M25559 Pain in unspecified hip: Secondary | ICD-10-CM | POA: Diagnosis not present

## 2013-07-30 DIAGNOSIS — M159 Polyosteoarthritis, unspecified: Secondary | ICD-10-CM | POA: Diagnosis not present

## 2013-07-30 DIAGNOSIS — M25569 Pain in unspecified knee: Secondary | ICD-10-CM | POA: Diagnosis not present

## 2013-08-03 ENCOUNTER — Encounter: Payer: Self-pay | Admitting: Adult Health

## 2013-08-06 ENCOUNTER — Encounter: Payer: Self-pay | Admitting: Adult Health

## 2013-08-09 ENCOUNTER — Other Ambulatory Visit: Payer: Self-pay | Admitting: *Deleted

## 2013-08-09 ENCOUNTER — Other Ambulatory Visit: Payer: Self-pay | Admitting: Adult Health

## 2013-08-09 MED ORDER — PAROXETINE HCL 10 MG PO TABS: 10.0000 mg | ORAL_TABLET | Freq: Every day | ORAL | Status: DC

## 2013-08-09 NOTE — Addendum Note (Signed)
Addended by: Vernetta Honey on: 08/09/2013 04:16 PM   Modules accepted: Orders

## 2013-08-09 NOTE — Telephone Encounter (Signed)
Rx refill sent to pharmacy. 

## 2013-08-09 NOTE — Telephone Encounter (Signed)
Ok to refill 

## 2013-09-07 DIAGNOSIS — M79609 Pain in unspecified limb: Secondary | ICD-10-CM | POA: Diagnosis not present

## 2013-09-07 DIAGNOSIS — M545 Low back pain, unspecified: Secondary | ICD-10-CM | POA: Diagnosis not present

## 2013-09-13 ENCOUNTER — Other Ambulatory Visit: Payer: Self-pay | Admitting: *Deleted

## 2013-09-13 MED ORDER — PAROXETINE HCL 10 MG PO TABS: 10.0000 mg | ORAL_TABLET | Freq: Every day | ORAL | Status: DC

## 2013-10-21 ENCOUNTER — Encounter: Payer: Self-pay | Admitting: Adult Health

## 2013-10-21 ENCOUNTER — Ambulatory Visit (INDEPENDENT_AMBULATORY_CARE_PROVIDER_SITE_OTHER): Payer: Medicare Other | Admitting: Adult Health

## 2013-10-21 VITALS — BP 124/82 | HR 79 | Temp 98.3°F | Resp 14 | Wt 183.8 lb

## 2013-10-21 DIAGNOSIS — J069 Acute upper respiratory infection, unspecified: Secondary | ICD-10-CM

## 2013-10-21 MED ORDER — AMOXICILLIN-POT CLAVULANATE 875-125 MG PO TABS: 1.0000 | ORAL_TABLET | Freq: Two times a day (BID) | ORAL | Status: DC

## 2013-10-21 NOTE — Patient Instructions (Addendum)
  TREATMENT  1. Decongestant: Sudafed (NOT IF HIGH BLOOD PRESSURE)  2. Nose Sprays: Saline nasal spray, Can use Afrin or Neosynephrine, but no more than 3 days  3. Cough Suppressants: DM portion of cough med, or Strong prescription  4. Expectorant: Liquify Secretions (Guaifenesin)  5. Example: Mucinex (expectorant) or Mucinex-D (expectorant and decongestant)  6. Take all prescribed meds  7. Breathe moist air: Humidifier, Vaporizer, or steam from shower   I am providing you with prescription for Augmentin. Start antibiotic only if your symptoms do not improve within 7-10 days of when they began, you develop a fever of 101 or greater, you begin to have bloody nasal drainage.

## 2013-10-21 NOTE — Progress Notes (Signed)
Pre visit review using our clinic review tool, if applicable. No additional management support is needed unless otherwise documented below in the visit note. 

## 2013-10-21 NOTE — Progress Notes (Signed)
   Subjective:    Patient ID: Michele Newton, female    DOB: 03-24-1956, 58 y.o.   MRN: [id number]  Sinus Problem Associated symptoms include congestion, coughing, ear pain, sinus pressure and a sore throat.    58 yo caucasian female presents today for signs of allergies/sinus problems. Has had a cough at night with some head stuffiness. Was sneezing yesterday. Has been going on for a couple of days. Took Allegra-D last night with no improvements. States feeling a little better today but has not taken anything. Denies fever, chills, body aches, nausea/vomiting.  Past Medical History  Diagnosis Date  . Arthritis   . Depression   . Frequent headaches   . Migraine   . Psoriatic arthritis     Current Outpatient Prescriptions on File Prior to Visit  Medication Sig Dispense Refill  . busPIRone (BUSPAR) 10 MG tablet Take 1 tablet (10 mg total) by mouth 2 (two) times daily.  60 tablet  3  . dexlansoprazole (DEXILANT) 60 MG capsule Take 1 capsule (60 mg total) by mouth daily.  30 capsule  11  . mometasone (NASONEX) 50 MCG/ACT nasal spray Place 2 sprays into the nose daily.  17 g  6  . Multiple Vitamins-Minerals (WOMENS MULTIVITAMIN PLUS PO) Take by mouth.      Marland Kitchen PARoxetine (PAXIL) 10 MG tablet Take 1 tablet (10 mg total) by mouth daily.  30 tablet  2  . QUEtiapine (SEROQUEL) 200 MG tablet Take 1 tablet (200 mg total) by mouth at bedtime.  30 tablet  5  . valACYclovir (VALTREX) 1000 MG tablet Take 1 tablet (1,000 mg total) by mouth 3 (three) times daily as needed. X 7 days prn  30 tablet  6   No current facility-administered medications on file prior to visit.     Review of Systems  Constitutional: Negative.   HENT: Positive for congestion, ear pain, sinus pressure and sore throat.   Eyes: Negative.   Respiratory: Positive for cough.   All other systems reviewed and are negative.      Objective:  BP 124/82  Pulse 79  Temp(Src) 98.3 F (36.8 C) (Oral)  Resp 14  Wt 183 lb 12 oz  (83.348 kg)  SpO2 98%   Physical Exam  Constitutional: No distress.  HENT:  Head: Normocephalic.  Right Ear: Hearing normal. No drainage. 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 Tympanic membrane is not erythematous.  Ears:  Mouth/Throat: Posterior oropharyngeal erythema present.  Neck: Neck supple.  Cardiovascular: Normal rate, regular rhythm and normal heart sounds.   Pulmonary/Chest: Effort normal and breath sounds normal.  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          Assessment & Plan:   1. URI (upper respiratory infection) Most likely viral infection - given antibiotic with instructions to take only if worsening symptoms (fever >101, or drainage) - amoxicillin-clavulanate (AUGMENTIN) 875-125 MG per tablet; Take 1 tablet by mouth 2 (two) times daily.  Dispense: 14 tablet; Refill: 0

## 2013-10-23 NOTE — Progress Notes (Signed)
Patient ID: Michele Newton, female   DOB: [birth year], 58 y.o.   MRN: [id number]   Subjective:    Patient ID: Michele Newton, female    DOB: 1956-01-14, 58 y.o.   MRN: [id number]  Sinus Problem Associated symptoms include congestion, coughing, sinus pressure and sneezing. Pertinent negatives include no chills or sore throat.    Pt presents with sinus symptoms that have been ongoing for 3 days. Clear drainage, post nasal drip, cough. Denies fever, chills. She has taken some over the counter medication. Wanted to be evaluated prior to the weekend. She has been keeping her grandchildren who have been sick.   Past Medical History  Diagnosis Date  . Arthritis   . Depression   . Frequent headaches   . Migraine   . Psoriatic arthritis     Current Outpatient Prescriptions on File Prior to Visit  Medication Sig Dispense Refill  . busPIRone (BUSPAR) 10 MG tablet Take 1 tablet (10 mg total) by mouth 2 (two) times daily.  60 tablet  3  . dexlansoprazole (DEXILANT) 60 MG capsule Take 1 capsule (60 mg total) by mouth daily.  30 capsule  11  . mometasone (NASONEX) 50 MCG/ACT nasal spray Place 2 sprays into the nose daily.  17 g  6  . Multiple Vitamins-Minerals (WOMENS MULTIVITAMIN PLUS PO) Take by mouth.      Marland Kitchen PARoxetine (PAXIL) 10 MG tablet Take 1 tablet (10 mg total) by mouth daily.  30 tablet  2  . QUEtiapine (SEROQUEL) 200 MG tablet Take 1 tablet (200 mg total) by mouth at bedtime.  30 tablet  5  . valACYclovir (VALTREX) 1000 MG tablet Take 1 tablet (1,000 mg total) by mouth 3 (three) times daily as needed. X 7 days prn  30 tablet  6   No current facility-administered medications on file prior to visit.     Review of Systems  Constitutional: Negative for fever and chills.  HENT: Positive for congestion, postnasal drip, rhinorrhea, sinus pressure and sneezing. Negative for sore throat.   Respiratory: Positive for cough and wheezing.   Musculoskeletal:       Swollen and painful right hand s/p  injury  All other systems reviewed and are negative.      Objective:  BP 124/82  Pulse 79  Temp(Src) 98.3 F (36.8 C) (Oral)  Resp 14  Wt 183 lb 12 oz (83.348 kg)  SpO2 98%   Physical Exam  Constitutional: She is oriented to person, place, and time. She appears well-developed and well-nourished. No distress.  HENT:  Head: Normocephalic and atraumatic.  Right Ear: External ear normal.  Left Ear: External ear normal.  Mouth/Throat: No oropharyngeal exudate.  Cardiovascular: Normal rate, regular rhythm and normal heart sounds.  Exam reveals no gallop.   No murmur heard. Pulmonary/Chest: Effort normal and breath sounds normal. No respiratory distress. She has no wheezes. She has no rales.  Lymphadenopathy:    She has cervical adenopathy.  Neurological: She is alert and oriented to person, place, and time.  Psychiatric: She has a normal mood and affect. Her behavior is normal. Judgment and thought content normal.      Assessment & Plan:   1. URI (upper respiratory infection) Continue supportive care with OTC medication. Provided her with prescription for Augmentin and instructed to fill only if she develops a fever of 101 or greater, bloody drainage from sinuses or symptoms do not resolve within 10 days. Pt agreed. - amoxicillin-clavulanate (AUGMENTIN) 875-125 MG per tablet; Take 1 tablet  by mouth 2 (two) times daily.  Dispense: 14 tablet; Refill: 0

## 2013-11-10 ENCOUNTER — Telehealth: Payer: Self-pay | Admitting: *Deleted

## 2013-11-10 NOTE — Telephone Encounter (Signed)
Fax from pharmacy requesting Quetiapine Fumarate 200mg .  please advise refill.

## 2013-11-11 ENCOUNTER — Other Ambulatory Visit: Payer: Self-pay | Admitting: Adult Health

## 2013-11-11 ENCOUNTER — Other Ambulatory Visit: Payer: Self-pay

## 2013-11-11 MED ORDER — QUETIAPINE FUMARATE 200 MG PO TABS: 200.0000 mg | ORAL_TABLET | Freq: Every day | ORAL | Status: DC

## 2013-11-11 NOTE — Telephone Encounter (Signed)
Medication refilled

## 2013-11-11 NOTE — Telephone Encounter (Signed)
See below

## 2013-11-11 NOTE — Telephone Encounter (Signed)
Ok to refill 

## 2013-11-23 ENCOUNTER — Encounter: Payer: Self-pay | Admitting: Adult Health

## 2013-11-23 ENCOUNTER — Ambulatory Visit (INDEPENDENT_AMBULATORY_CARE_PROVIDER_SITE_OTHER): Payer: Medicare Other | Admitting: Adult Health

## 2013-11-23 VITALS — BP 130/82 | HR 80 | Temp 98.0°F | Resp 14 | Wt 181.2 lb

## 2013-11-23 DIAGNOSIS — Z8669 Personal history of other diseases of the nervous system and sense organs: Secondary | ICD-10-CM | POA: Diagnosis not present

## 2013-11-23 DIAGNOSIS — J329 Chronic sinusitis, unspecified: Secondary | ICD-10-CM | POA: Diagnosis not present

## 2013-11-23 MED ORDER — MOMETASONE FUROATE 50 MCG/ACT NA SUSP: 2.0000 | Freq: Every day | NASAL | Status: DC

## 2013-11-23 MED ORDER — QUETIAPINE FUMARATE 200 MG PO TABS: 200.0000 mg | ORAL_TABLET | Freq: Every day | ORAL | Status: DC

## 2013-11-23 MED ORDER — DOXYCYCLINE HYCLATE 100 MG PO TABS: 100.0000 mg | ORAL_TABLET | Freq: Two times a day (BID) | ORAL | Status: DC

## 2013-11-23 NOTE — Progress Notes (Signed)
Pre visit review using our clinic review tool, if applicable. No additional management support is needed unless otherwise documented below in the visit note. 

## 2013-11-23 NOTE — Patient Instructions (Signed)
  Take doxycycline twice a day for 7 days.  I am referring you to ENT for evaluation of your left ear. Please take the picture with you to the appointment. Please see Hoyle Sauer before leaving today to schedule your appointment.  Call if no improvement of symptoms.

## 2013-11-23 NOTE — Progress Notes (Signed)
Subjective:    Patient ID: Michele Newton, female    DOB: 1956-04-17, 58 y.o.   MRN: [id number]  Otalgia  Associated symptoms include coughing, headaches, hearing loss and a sore throat.  Headache  Associated symptoms include coughing, ear pain, a fever (Unsure), hearing loss, sinus pressure and a sore throat.    58 yo female with history of chronic sinusitis presents today for sinus infection. Has been going on for a couple of weeks now. Unsure if she had fever, but she has been coughhing all the time and has developed a sore throat today. Indicates she has sinus pressure. Has attempted Allegra-D, saline nasal sprays, and sinus water pick all with little effect.  Previously see by ENT for moderate to severe hearing loss for which she is picking up her hearing aids Thursday this week. Has not seen ENT in [place] but was previously followed by ENT in [place] where sinus surgery was trialled.   Past Medical History  Diagnosis Date  . Arthritis   . Depression   . Frequent headaches   . Migraine   . Psoriatic arthritis     Current Outpatient Prescriptions on File Prior to Visit  Medication Sig Dispense Refill  . busPIRone (BUSPAR) 10 MG tablet Take 1 tablet (10 mg total) by mouth 2 (two) times daily.  60 tablet  3  . dexlansoprazole (DEXILANT) 60 MG capsule Take 1 capsule (60 mg total) by mouth daily.  30 capsule  11  . mometasone (NASONEX) 50 MCG/ACT nasal spray Place 2 sprays into the nose daily.  17 g  6  . Multiple Vitamins-Minerals (WOMENS MULTIVITAMIN PLUS PO) Take by mouth.      Marland Kitchen PARoxetine (PAXIL) 10 MG tablet Take 1 tablet (10 mg total) by mouth daily.  30 tablet  2  . QUEtiapine (SEROQUEL) 200 MG tablet Take 1 tablet (200 mg total) by mouth at bedtime.  30 tablet  5  . valACYclovir (VALTREX) 1000 MG tablet Take 1 tablet (1,000 mg total) by mouth 3 (three) times daily as needed. X 7 days prn  30 tablet  6   No current facility-administered medications on file prior to  visit.     Review of Systems  Constitutional: Positive for fever (Unsure) and chills.  HENT: Positive for congestion, ear pain, hearing loss, sinus pressure and sore throat.   Eyes: Negative.   Respiratory: Positive for cough.   Cardiovascular: Negative.   Gastrointestinal: Negative.   Neurological: Positive for headaches.  All other systems reviewed and are negative.      Objective:  BP 130/82  Pulse 80  Temp(Src) 98 F (36.7 C) (Oral)  Resp 14  Wt 181 lb 4 oz (82.214 kg)  SpO2 96%   Physical Exam  Constitutional: She is oriented to person, place, and time. She appears well-developed and well-nourished. No distress.  HENT:  Head: Normocephalic.  Right Ear: External ear normal. 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  Left Ear: External ear normal.  Mouth/Throat: No posterior oropharyngeal erythema.  Lymphadenopathy:    She has cervical adenopathy.  Neurological: She is alert and oriented to person, place, and time.  Skin: Skin is warm and dry.  Psychiatric: She has a normal mood and affect. Her behavior is normal. Judgment and thought content normal.      Assessment & Plan:   1. Chronic sinusitis, unspecified location Symptoms consistent with bacterial sinus infection. Start doxycycline as described. Continue sinus care including humidified air, drinking plenty of fluids, and decongestants as needed.  -  doxycycline (VIBRA-TABS) 100 MG tablet; Take 1 tablet (100 mg total) by mouth 2 (two) times daily.  Dispense: 14 tablet; Refill: 0  2. History of recurrent ear infection Refer to ENT for hx of chronic ear infections. - Ambulatory referral to ENT

## 2013-11-25 ENCOUNTER — Telehealth: Payer: Self-pay

## 2013-11-25 NOTE — Telephone Encounter (Signed)
Arbie Cookey from [facility] called stating patient was prescribed paxil and seroquel which has a drug interaction. Arbie Cookey wanted to know is it ok to fill the Rx for seroquel? Spoke to patient and she stated she has taken both drugs together for a very long time and has not had any problems. Please advise.

## 2013-11-25 NOTE — Telephone Encounter (Signed)
Ok to refill. Serotonin syndrome is the potential reaction. She has not had any symptoms.

## 2013-11-26 NOTE — Telephone Encounter (Signed)
Left detailed message on patient's cell voicemail letting her know Per Raquel it is ok for her to continue taking both paxil and seroquel. Pharmacy has been notified as well.

## 2013-12-08 ENCOUNTER — Other Ambulatory Visit: Payer: Self-pay | Admitting: *Deleted

## 2013-12-08 MED ORDER — PAROXETINE HCL 10 MG PO TABS: 10.0000 mg | ORAL_TABLET | Freq: Every day | ORAL | Status: DC

## 2014-01-18 DIAGNOSIS — J310 Chronic rhinitis: Secondary | ICD-10-CM | POA: Diagnosis not present

## 2014-01-18 DIAGNOSIS — H7200 Central perforation of tympanic membrane, unspecified ear: Secondary | ICD-10-CM | POA: Diagnosis not present

## 2014-01-18 DIAGNOSIS — J329 Chronic sinusitis, unspecified: Secondary | ICD-10-CM | POA: Diagnosis not present

## 2014-03-04 ENCOUNTER — Other Ambulatory Visit: Payer: Self-pay | Admitting: *Deleted

## 2014-03-04 MED ORDER — PAROXETINE HCL 10 MG PO TABS: 10.0000 mg | ORAL_TABLET | Freq: Every day | ORAL | Status: DC

## 2014-04-18 ENCOUNTER — Encounter: Payer: Self-pay | Admitting: Internal Medicine

## 2014-04-18 ENCOUNTER — Ambulatory Visit (INDEPENDENT_AMBULATORY_CARE_PROVIDER_SITE_OTHER): Payer: Medicare Other | Admitting: Internal Medicine

## 2014-04-18 VITALS — BP 110/66 | HR 72 | Temp 98.1°F | Wt 181.0 lb

## 2014-04-18 DIAGNOSIS — J0110 Acute frontal sinusitis, unspecified: Secondary | ICD-10-CM

## 2014-04-18 MED ORDER — LEVOFLOXACIN 500 MG PO TABS
500.0000 mg | ORAL_TABLET | Freq: Every day | ORAL | Status: DC
Start: 2014-04-18 — End: 2014-04-25

## 2014-04-18 MED ORDER — CLARITHROMYCIN 500 MG PO TABS: 500.0000 mg | ORAL_TABLET | Freq: Two times a day (BID) | ORAL | Status: DC

## 2014-04-18 NOTE — Progress Notes (Addendum)
HPI  Pt presents to the clinic today with c/o headache, facial pain and pressure, nasal congestion, ear pain and sore throat. She reports her symptoms started 1 month ago. She is blowing thick green mucous out of her nose. She denies fever or chills but has felt fatigued. She has tried Mucinex OTC without much relief. She has also used her Nasonex. She does have a history of chronic sinusitis. She has had sick contacts.  Review of Systems    Past Medical History  Diagnosis Date  . Arthritis   . Depression   . Frequent headaches   . Migraine   . Psoriatic arthritis     Family History  Problem Relation Age of Onset  . Arthritis Mother   . Hyperlipidemia Mother   . Heart disease Mother 67    Died of heart failure  . Hypertension Mother   . Diabetes Mother   . Arthritis Father   . Cancer Father     prostate cancer  . Diabetes Brother     History   Social History  . Marital Status: Married    Spouse Name: Michele Newton    Number of Children: 2  . Years of Education: 14   Occupational History  .  Other   Social History Main Topics  . Smoking status: Never Smoker   . Smokeless tobacco: Never Used  . Alcohol Use: No  . Drug Use: No  . Sexual Activity: Yes   Other Topics Concern  . Not on file   Social History Narrative   Michele Newton lives at home with her husband, Michele Newton, of 5 months and a cat named, Puff. She has 2 sons from a previous marriage. She has 4 grandchildren.     Allergies  Allergen Reactions  . Amitriptyline Other (See Comments)    hallucinations  . Effexor [Venlafaxine] Other (See Comments)    Amnesia, hospitalized x 2 weeks  . Vicodin [Hydrocodone-Acetaminophen] Itching     Constitutional: Positive headache, fatigueDenies  fever or abrupt weight changes.  HEENT:  Positive  facial pain, nasal congestion, ear pain and sore throat. Denies eye redness, ringing in the ears, wax buildup, runny nose or bloody nose. Respiratory: Positive cough and shortness of breath.  Denies difficulty breathing.  Cardiovascular: Denies chest pain, chest tightness, palpitations or swelling in the hands or feet.   No other specific complaints in a complete review of systems (except as listed in HPI above).  Objective:  BP 110/66 mmHg  Pulse 72  Temp(Src) 98.1 F (36.7 C) (Oral)  Wt 181 lb (82.101 kg)  SpO2 99%   General: Appears her stated age, ill appearing in NAD. HEENT: Head: normal shape and size,frontal sinus tenderness noted;  Ears: Tm's gray and intact, normal light reflex, + perforation on the right; Nose: mucosa pink and moist, septum midline; Throat/Mouth: + PND. Teeth present, mucosa erythematous and moist, no exudate noted, no lesions or ulcerations noted.  Neck: Cervical adenopathy noted. Cardiovascular: Normal rate and rhythm. S1,S2 noted.  No murmur, rubs or gallops noted.  Pulmonary/Chest: Normal effort and positive vesicular breath sounds. No respiratory distress. No wheezes, rales or ronchi noted.      Assessment & Plan:   Acute bacterial sinusitis  Can use a Neti Pot which can be purchased from your local drug store. Continue Nasonex Biaxin BID for 10 days (pharmacist called concerned about drug interaction with seroquel and paxil. She suggest Levaquin x 7 days) New RX provided for Levaquin  RTC as needed or if  symptoms persist.

## 2014-04-18 NOTE — Addendum Note (Signed)
Addended by: Jearld Fenton on: 04/18/2014 03:44 PM   Modules accepted: Miquel Dunn

## 2014-04-18 NOTE — Progress Notes (Signed)
Pre visit review using our clinic review tool, if applicable. No additional management support is needed unless otherwise documented below in the visit note. 

## 2014-04-18 NOTE — Patient Instructions (Signed)

## 2014-04-18 NOTE — Addendum Note (Signed)
Addended by: Jearld Fenton on: 04/18/2014 03:51 PM   Modules accepted: Orders, Medications, SmartSet

## 2014-04-25 ENCOUNTER — Encounter: Payer: Self-pay | Admitting: Family Medicine

## 2014-04-25 ENCOUNTER — Ambulatory Visit (INDEPENDENT_AMBULATORY_CARE_PROVIDER_SITE_OTHER): Payer: Medicare Other | Admitting: Family Medicine

## 2014-04-25 VITALS — BP 126/74 | HR 85 | Temp 98.1°F | Ht 65.5 in | Wt 183.2 lb

## 2014-04-25 DIAGNOSIS — J329 Chronic sinusitis, unspecified: Secondary | ICD-10-CM

## 2014-04-25 MED ORDER — AMOXICILLIN-POT CLAVULANATE 875-125 MG PO TABS: 1.0000 | ORAL_TABLET | Freq: Two times a day (BID) | ORAL | Status: DC

## 2014-04-25 NOTE — Assessment & Plan Note (Signed)
Acute on chronic sinusitis- in pt with complex hx  No imp on levaquin tx with augmentin  Also viral component no doubt Disc symptomatic care - see instructions on AVS   Recommend mucinex to loosen phlegm Inc fluids Update if not starting to improve in a week or if worsening   May need to f/u with ENT if not improving

## 2014-04-25 NOTE — Progress Notes (Signed)
Subjective:    Patient ID: Michele Newton, female    DOB: 06-15-1955, 58 y.o.   MRN: [id number]  HPI Here for uri symptoms   Was dx with sinusitis -and tx with levaquin (just finishing it) ---never improved  Not getting better  A lot of congestion  Croupy cough - getting up mucous yellow in color to clear  Thick mucous from her nose - the same color  R ear bothersome and leaking  Head hurts/ ears hurt achey   Had a fever yesterday- 101   Did not get a flu shot this year   She gets frequent sinus infections and has had sinus surg/ tubes in ears  Has pemanent perf in R ear-that drains  She sees ENT  May consider allergy testing    Patient Active Problem List   Diagnosis Date Noted  . URI (upper respiratory infection) 10/21/2013  . Back pain 07/27/2013  . Hip pain, left 07/27/2013  . Left knee pain 07/27/2013  . Psoriatic arthritis 07/27/2013  . Anxiety 06/11/2013  . Acute bronchitis 04/27/2013  . Right otitis media 04/16/2013  . Sinusitis, chronic 02/11/2013  . Weight gain 02/11/2013  . Screening for breast cancer 02/11/2013   Past Medical History  Diagnosis Date  . Arthritis   . Depression   . Frequent headaches   . Migraine   . Psoriatic arthritis    Past Surgical History  Procedure Laterality Date  . Breast surgery  2000    biopsy  . Tonsillectomy and adenoidectomy  1985  . Ventricular atrial shunt Right 1995  . Tympanostomy tube placement  1998  . Nasal sinus surgery  1998   History  Substance Use Topics  . Smoking status: Never Smoker   . Smokeless tobacco: Never Used  . Alcohol Use: No   Family History  Problem Relation Age of Onset  . Arthritis Mother   . Hyperlipidemia Mother   . Heart disease Mother 54    Died of heart failure  . Hypertension Mother   . Diabetes Mother   . Arthritis Father   . Cancer Father     prostate cancer  . Diabetes Brother    Allergies  Allergen Reactions  . Amitriptyline Other (See Comments)   hallucinations  . Effexor [Venlafaxine] Other (See Comments)    Amnesia, hospitalized x 2 weeks  . Vicodin [Hydrocodone-Acetaminophen] Itching   Current Outpatient Prescriptions on File Prior to Visit  Medication Sig Dispense Refill  . dexlansoprazole (DEXILANT) 60 MG capsule Take 1 capsule (60 mg total) by mouth daily. 30 capsule 11  . mometasone (NASONEX) 50 MCG/ACT nasal spray Place 2 sprays into the nose daily. 17 g 6  . Multiple Vitamins-Minerals (WOMENS MULTIVITAMIN PLUS PO) Take by mouth.    Marland Kitchen PARoxetine (PAXIL) 10 MG tablet Take 1 tablet (10 mg total) by mouth daily. 30 tablet 2  . QUEtiapine (SEROQUEL) 200 MG tablet Take 1 tablet (200 mg total) by mouth at bedtime. 30 tablet 5  . valACYclovir (VALTREX) 1000 MG tablet Take 1 tablet (1,000 mg total) by mouth 3 (three) times daily as needed. X 7 days prn 30 tablet 6   No current facility-administered medications on file prior to visit.      Review of Systems Review of Systems  Constitutional: Negative for , appetite change,  and unexpected weight change. pos for malaise ENT pos for sinus pain / ear pain and congestion  Eyes: Negative for pain and visual disturbance.  Respiratory: Negative for wheeze  and shortness of breath.   Cardiovascular: Negative for cp or palpitations    Gastrointestinal: Negative for nausea, diarrhea and constipation.  Genitourinary: Negative for urgency and frequency.  Skin: Negative for pallor or rash   Neurological: Negative for weakness, light-headedness, numbness and headaches.  Hematological: Negative for adenopathy. Does not bruise/bleed easily.  Psychiatric/Behavioral: Negative for dysphoric mood. The patient is not nervous/anxious.         Objective:   Physical Exam  Constitutional: She appears well-developed and well-nourished. No distress.  obese and well appearing   HENT:  Head: Normocephalic and atraumatic.  Left Ear: External ear normal.  Mouth/Throat: Oropharynx is clear and moist.  No oropharyngeal exudate.  Chronic perf in R TM Nares are injected and congested  Marked tenderness bilat maxillary sinuses   Throat clear with post nasal drip   Eyes: Conjunctivae and EOM are normal. Pupils are equal, round, and reactive to light. Right eye exhibits no discharge. Left eye exhibits no discharge.  Neck: Normal range of motion. Neck supple.  Cardiovascular: Normal rate and regular rhythm.   Pulmonary/Chest: Effort normal and breath sounds normal. No respiratory distress. She has no wheezes. She has no rales.  Lymphadenopathy:    She has no cervical adenopathy.  Neurological: She is alert. No cranial nerve deficit.  Skin: Skin is warm and dry. No rash noted. No erythema. No pallor.  Psychiatric: She has a normal mood and affect.          Assessment & Plan:   Problem List Items Addressed This Visit      Respiratory   Sinusitis, chronic - Primary    Acute on chronic sinusitis- in pt with complex hx  No imp on levaquin tx with augmentin  Also viral component no doubt Disc symptomatic care - see instructions on AVS   Recommend mucinex to loosen phlegm Inc fluids Update if not starting to improve in a week or if worsening   May need to f/u with ENT if not improving     Relevant Medications      amoxicillin-clavulanate (AUGMENTIN) tablet 875-125 mg

## 2014-04-25 NOTE — Progress Notes (Signed)
Pre visit review using our clinic review tool, if applicable. No additional management support is needed unless otherwise documented below in the visit note. 

## 2014-04-25 NOTE — Patient Instructions (Signed)
Drink lots of fluids Rest  Take augmentin as directed  Try mucinex otc   (plain)  Ibuprofen over the counter for pain /swelling/fever   Warm compresses on face help too   Update if not starting to improve in a week or if worsening

## 2014-04-28 ENCOUNTER — Other Ambulatory Visit: Payer: Self-pay | Admitting: *Deleted

## 2014-04-28 MED ORDER — QUETIAPINE FUMARATE 200 MG PO TABS: 200.0000 mg | ORAL_TABLET | Freq: Every day | ORAL | Status: DC

## 2014-05-25 DIAGNOSIS — H2513 Age-related nuclear cataract, bilateral: Secondary | ICD-10-CM | POA: Diagnosis not present

## 2014-05-25 DIAGNOSIS — H524 Presbyopia: Secondary | ICD-10-CM | POA: Diagnosis not present

## 2014-05-26 DIAGNOSIS — S0502XA Injury of conjunctiva and corneal abrasion without foreign body, left eye, initial encounter: Secondary | ICD-10-CM | POA: Diagnosis not present

## 2014-06-07 ENCOUNTER — Other Ambulatory Visit: Payer: Self-pay | Admitting: Internal Medicine

## 2014-07-06 ENCOUNTER — Encounter: Payer: Self-pay | Admitting: Nurse Practitioner

## 2014-07-06 ENCOUNTER — Ambulatory Visit (INDEPENDENT_AMBULATORY_CARE_PROVIDER_SITE_OTHER): Payer: Medicare Other | Admitting: Nurse Practitioner

## 2014-07-06 VITALS — BP 126/72 | HR 83 | Temp 98.1°F | Resp 14 | Ht 65.5 in | Wt 186.0 lb

## 2014-07-06 DIAGNOSIS — G479 Sleep disorder, unspecified: Secondary | ICD-10-CM

## 2014-07-06 DIAGNOSIS — K219 Gastro-esophageal reflux disease without esophagitis: Secondary | ICD-10-CM | POA: Diagnosis not present

## 2014-07-06 MED ORDER — MOMETASONE FUROATE 50 MCG/ACT NA SUSP: 2.0000 | Freq: Every day | NASAL | Status: DC

## 2014-07-06 MED ORDER — PAROXETINE HCL 10 MG PO TABS: 10.0000 mg | ORAL_TABLET | Freq: Every day | ORAL | Status: DC

## 2014-07-06 MED ORDER — VALACYCLOVIR HCL 1 G PO TABS: 1000.0000 mg | ORAL_TABLET | Freq: Three times a day (TID) | ORAL | Status: DC | PRN

## 2014-07-06 MED ORDER — OMEPRAZOLE 40 MG PO CPDR: 40.0000 mg | DELAYED_RELEASE_CAPSULE | Freq: Every day | ORAL | Status: DC

## 2014-07-06 MED ORDER — QUETIAPINE FUMARATE 200 MG PO TABS: 200.0000 mg | ORAL_TABLET | Freq: Every day | ORAL | Status: DC

## 2014-07-06 NOTE — Progress Notes (Signed)
Pre visit review using our clinic review tool, if applicable. No additional management support is needed unless otherwise documented below in the visit note. 

## 2014-07-06 NOTE — Progress Notes (Signed)
   Subjective:    Patient ID: Michele Newton, female    DOB: 29-Dec-1955, 59 y.o.   MRN: [id number]  HPI  Michele Newton is a 59 yo female with a CC of medication refills.   1) Dexilant too expensive- acid reflux worsening, been off for 2 weeks  2) Trouble falling asleep. Stays active during the day.   Review of Systems  Constitutional: Negative for fever, chills, diaphoresis and fatigue.  HENT: Negative for sore throat and trouble swallowing.   Respiratory: Negative for chest tightness, shortness of breath and wheezing.   Cardiovascular: Negative for chest pain, palpitations and leg swelling.  Gastrointestinal: Negative for nausea, vomiting, abdominal pain, diarrhea, constipation and abdominal distention.  Skin: Negative for rash.  Neurological: Negative for dizziness, weakness, numbness and headaches.  Psychiatric/Behavioral: Positive for sleep disturbance. The patient is not nervous/anxious.        Objective:   Physical Exam  Constitutional: She is oriented to person, place, and time. She appears well-developed and well-nourished. No distress.  BP 126/72 mmHg  Pulse 83  Temp(Src) 98.1 F (36.7 C)  Resp 14  Ht 5' 5.5" (1.664 m)  Wt 186 lb (84.369 kg)  BMI 30.47 kg/m2  SpO2 97%   HENT:  Head: Normocephalic and atraumatic.  Right Ear: External ear normal.  Left Ear: External ear normal.  Cardiovascular: Normal rate, regular rhythm, normal heart sounds and intact distal pulses.  Exam reveals no gallop and no friction rub.   No murmur heard. Pulmonary/Chest: Effort normal and breath sounds normal. No respiratory distress. She has no wheezes. She has no rales. She exhibits no tenderness.  Neurological: She is alert and oriented to person, place, and time. No cranial nerve deficit. She exhibits normal muscle tone. Coordination normal.  Skin: Skin is warm and dry. No rash noted. She is not diaphoretic.  Psychiatric: She has a normal mood and affect. Her behavior is normal. Judgment and  thought content normal.         Assessment & Plan:

## 2014-07-06 NOTE — Patient Instructions (Signed)
Insomnia Insomnia is frequent trouble falling and/or staying asleep. Insomnia can be a long term problem or a short term problem. Both are common. Insomnia can be a short term problem when the wakefulness is related to a certain stress or worry. Long term insomnia is often related to ongoing stress during waking hours and/or poor sleeping habits. Overtime, sleep deprivation itself can make the problem worse. Every little thing feels more severe because you are overtired and your ability to cope is decreased. CAUSES   Stress, anxiety, and depression.  Poor sleeping habits.  Distractions such as TV in the bedroom.  Naps close to bedtime.  Engaging in emotionally charged conversations before bed.  Technical reading before sleep.  Alcohol and other sedatives. They may make the problem worse. They can hurt normal sleep patterns and normal dream activity.  Stimulants such as caffeine for several hours prior to bedtime.  Pain syndromes and shortness of breath can cause insomnia.  Exercise late at night.  Changing time zones may cause sleeping problems (jet lag). It is sometimes helpful to have someone observe your sleeping patterns. They should look for periods of not breathing during the night (sleep apnea). They should also look to see how long those periods last. If you live alone or observers are uncertain, you can also be observed at a sleep clinic where your sleep patterns will be professionally monitored. Sleep apnea requires a checkup and treatment. Give your caregivers your medical history. Give your caregivers observations your family has made about your sleep.  SYMPTOMS   Not feeling rested in the morning.  Anxiety and restlessness at bedtime.  Difficulty falling and staying asleep. TREATMENT   Your caregiver may prescribe treatment for an underlying medical disorders. Your caregiver can give advice or help if you are using alcohol or other drugs for self-medication. Treatment  of underlying problems will usually eliminate insomnia problems.  Medications can be prescribed for short time use. They are generally not recommended for lengthy use.  Over-the-counter sleep medicines are not recommended for lengthy use. They can be habit forming.  You can promote easier sleeping by making lifestyle changes such as:  Using relaxation techniques that help with breathing and reduce muscle tension.  Exercising earlier in the day.  Changing your diet and the time of your last meal. No night time snacks.  Establish a regular time to go to bed.  Counseling can help with stressful problems and worry.  Soothing music and white noise may be helpful if there are background noises you cannot remove.  Stop tedious detailed work at least one hour before bedtime. HOME CARE INSTRUCTIONS   Keep a diary. Inform your caregiver about your progress. This includes any medication side effects. See your caregiver regularly. Take note of:  Times when you are asleep.  Times when you are awake during the night.  The quality of your sleep.  How you feel the next day. This information will help your caregiver care for you.  Get out of bed if you are still awake after 15 minutes. Read or do some quiet activity. Keep the lights down. Wait until you feel sleepy and go back to bed.  Keep regular sleeping and waking hours. Avoid naps.  Exercise regularly.  Avoid distractions at bedtime. Distractions include watching television or engaging in any intense or detailed activity like attempting to balance the household checkbook.  Develop a bedtime ritual. Keep a familiar routine of bathing, brushing your teeth, climbing into bed at the same   time each night, listening to soothing music. Routines increase the success of falling to sleep faster.  Use relaxation techniques. This can be using breathing and muscle tension release routines. It can also include visualizing peaceful scenes. You can  also help control troubling or intruding thoughts by keeping your mind occupied with boring or repetitive thoughts like the old concept of counting sheep. You can make it more creative like imagining planting one beautiful flower after another in your backyard garden.  During your day, work to eliminate stress. When this is not possible use some of the previous suggestions to help reduce the anxiety that accompanies stressful situations. MAKE SURE YOU:   Understand these instructions.  Will watch your condition.  Will get help right away if you are not doing well or get worse. Document Released: 04/19/2000 Document Revised: 07/15/2011 Document Reviewed: 05/20/2007 [employer] Patient Information 2015 [employer]. This information is not intended to replace advice given to you by your health care provider. Make sure you discuss any questions you have with your health care provider.  

## 2014-07-09 DIAGNOSIS — G479 Sleep disorder, unspecified: Secondary | ICD-10-CM | POA: Insufficient documentation

## 2014-07-09 DIAGNOSIS — K219 Gastro-esophageal reflux disease without esophagitis: Secondary | ICD-10-CM | POA: Insufficient documentation

## 2014-07-09 NOTE — Assessment & Plan Note (Signed)
Refilled seroquel and asked her to read about insomnia and come up with a bed time routine to better help with sleep. Will follow.

## 2014-07-09 NOTE — Assessment & Plan Note (Signed)
D/c'd Dexilant due to cost. Omeprazole 40 mg daily for GERD. Will follow.

## 2014-07-28 ENCOUNTER — Ambulatory Visit (INDEPENDENT_AMBULATORY_CARE_PROVIDER_SITE_OTHER): Payer: Medicare Other | Admitting: Family Medicine

## 2014-07-28 ENCOUNTER — Encounter: Payer: Self-pay | Admitting: Family Medicine

## 2014-07-28 ENCOUNTER — Ambulatory Visit (INDEPENDENT_AMBULATORY_CARE_PROVIDER_SITE_OTHER)
Admission: RE | Admit: 2014-07-28 | Discharge: 2014-07-28 | Disposition: A | Discharge status: Home / Self Care | Payer: Medicare Other | Source: Ambulatory Visit | Attending: Family Medicine | Admitting: Family Medicine

## 2014-07-28 VITALS — BP 116/72 | HR 71 | Temp 98.0°F | Wt 184.0 lb

## 2014-07-28 DIAGNOSIS — S99921A Unspecified injury of right foot, initial encounter: Secondary | ICD-10-CM | POA: Diagnosis not present

## 2014-07-28 DIAGNOSIS — M79671 Pain in right foot: Secondary | ICD-10-CM

## 2014-07-28 NOTE — Assessment & Plan Note (Signed)
New- Tender over the toes but also the entire forefoot. Xray today to rule out fracture. Advised to wear more supportive shoes (she is wearing flip flops today). The patient indicates understanding of these issues and agrees with the plan.

## 2014-07-28 NOTE — Patient Instructions (Signed)
Good to see you. We will call you with your xray results.

## 2014-07-28 NOTE — Progress Notes (Signed)
Subjective:   Patient ID: Michele Newton, female    DOB: April 22, 1956, 59 y.o.   MRN: [id number]  Michele Newton is a pleasant 59 y.o. year old female pt of [name], new to me, who presents to clinic today with Foot Pain  on 07/28/2014  HPI:  Right foot injury- 1 week ago, dropped a can of beans on her toes of her right foot.  A day or two later, she also developed pain on the top of the rest of her foot, not just where the can landed, mainly with weight bearing. No swelling.  She does have bruising over her toes. Never had anything like this before.  Toenails have remained in tact.   Current Outpatient Prescriptions on File Prior to Visit  Medication Sig Dispense Refill  . mometasone (NASONEX) 50 MCG/ACT nasal spray Place 2 sprays into the nose daily. 17 g 6  . Multiple Vitamins-Minerals (WOMENS MULTIVITAMIN PLUS PO) Take by mouth.    Marland Kitchen omeprazole (PRILOSEC) 40 MG capsule Take 1 capsule (40 mg total) by mouth daily. 30 capsule 3  . PARoxetine (PAXIL) 10 MG tablet Take 1 tablet (10 mg total) by mouth daily. 30 tablet 2  . QUEtiapine (SEROQUEL) 200 MG tablet Take 1 tablet (200 mg total) by mouth at bedtime. 30 tablet 2  . valACYclovir (VALTREX) 1000 MG tablet Take 1 tablet (1,000 mg total) by mouth 3 (three) times daily as needed. X 7 days prn 30 tablet 6   No current facility-administered medications on file prior to visit.    Allergies  Allergen Reactions  . Amitriptyline Other (See Comments)    hallucinations  . Effexor [Venlafaxine] Other (See Comments)    Amnesia, hospitalized x 2 weeks  . Vicodin [Hydrocodone-Acetaminophen] Itching    Past Medical History  Diagnosis Date  . Arthritis   . Depression   . Frequent headaches   . Migraine   . Psoriatic arthritis     Past Surgical History  Procedure Laterality Date  . Breast surgery  2000    biopsy  . Tonsillectomy and adenoidectomy  1985  . Ventricular atrial shunt Right 1995  . Tympanostomy tube placement  1998  .  Nasal sinus surgery  1998    Family History  Problem Relation Age of Onset  . Arthritis Mother   . Hyperlipidemia Mother   . Heart disease Mother 46    Died of heart failure  . Hypertension Mother   . Diabetes Mother   . Arthritis Father   . Cancer Father     prostate cancer  . Diabetes Brother     History   Social History  . Marital Status: Married    Spouse Name: [name]  . Number of Children: 2  . Years of Education: 14   Occupational History  .  Other   Social History Main Topics  . Smoking status: Never Smoker   . Smokeless tobacco: Never Used  . Alcohol Use: No  . Drug Use: No  . Sexual Activity: Yes   Other Topics Concern  . Not on file   Social History Narrative   Tabithia lives at home with her husband, [name], of 5 months and a cat named, Puff. She has 2 sons from a previous marriage. She has 4 grandchildren.    The PMH, PSH, Social History, Family History, Medications, and allergies have been reviewed in [facility], and have been updated if relevant.   Review of Systems  Constitutional: Negative.   HENT: Negative.  Musculoskeletal: Negative for gait problem.  Skin: Negative.   Neurological: Negative.   All other systems reviewed and are negative.      Objective:    BP 116/72 mmHg  Pulse 71  Temp(Src) 98 F (36.7 C) (Oral)  Wt 184 lb (83.462 kg)  SpO2 97%   Physical Exam  Constitutional: She is oriented to person, place, and time. She appears well-developed and well-nourished. No distress.  HENT:  Head: Normocephalic.  Eyes: Conjunctivae are normal.  Cardiovascular: Normal rate.   Pulmonary/Chest: Effort normal.  Musculoskeletal:       Left foot: There is tenderness and bony tenderness. There is no swelling, no deformity and no laceration.  Neurological: She is alert and oriented to person, place, and time. No cranial nerve deficit.  Skin: Skin is warm and dry.  Psychiatric: She has a normal mood and affect. Her behavior is normal. Judgment and  thought content normal.  Nursing note and vitals reviewed.         Assessment & Plan:   Right foot pain No Follow-up on file.

## 2014-07-28 NOTE — Progress Notes (Signed)
Pre visit review using our clinic review tool, if applicable. No additional management support is needed unless otherwise documented below in the visit note. 

## 2014-08-15 ENCOUNTER — Ambulatory Visit (INDEPENDENT_AMBULATORY_CARE_PROVIDER_SITE_OTHER): Payer: Medicare Other | Admitting: Internal Medicine

## 2014-08-15 ENCOUNTER — Encounter: Payer: Self-pay | Admitting: Internal Medicine

## 2014-08-15 VITALS — BP 126/78 | HR 78 | Temp 97.9°F | Resp 16 | Ht 65.5 in | Wt 186.5 lb

## 2014-08-15 DIAGNOSIS — H65199 Other acute nonsuppurative otitis media, unspecified ear: Secondary | ICD-10-CM | POA: Diagnosis not present

## 2014-08-15 DIAGNOSIS — H6590 Unspecified nonsuppurative otitis media, unspecified ear: Secondary | ICD-10-CM | POA: Insufficient documentation

## 2014-08-15 MED ORDER — FLUCONAZOLE 150 MG PO TABS: 150.0000 mg | ORAL_TABLET | Freq: Every day | ORAL | Status: DC

## 2014-08-15 MED ORDER — BENZONATATE 200 MG PO CAPS: 200.0000 mg | ORAL_CAPSULE | Freq: Three times a day (TID) | ORAL | Status: DC | PRN

## 2014-08-15 MED ORDER — LEVOFLOXACIN 500 MG PO TABS: 500.0000 mg | ORAL_TABLET | Freq: Every day | ORAL | Status: DC

## 2014-08-15 MED ORDER — PREDNISONE 10 MG PO TABS: ORAL_TABLET | ORAL | Status: DC

## 2014-08-15 NOTE — Progress Notes (Signed)
Patient ID: Michele Newton, female   DOB: Dec 04, 1955, 59 y.o.   MRN: [id number]  Patient Active Problem List   Diagnosis Date Noted  . Non-suppurative otitis media 08/15/2014  . Right foot pain 07/28/2014  . GERD (gastroesophageal reflux disease) 07/09/2014  . Sleep disorder 07/09/2014  . Back pain 07/27/2013  . Hip pain, left 07/27/2013  . Left knee pain 07/27/2013  . Psoriatic arthritis 07/27/2013  . Anxiety 06/11/2013  . Acute bronchitis 04/27/2013  . Weight gain 02/11/2013  . Screening for breast cancer 02/11/2013    Subjective:  CC:   Chief Complaint  Patient presents with  . Sinusitis    Cough, sinus drainage, clear in color  . Otalgia    Bilateral    HPI:   Michele Newton is a 59 y.o. female who presents for  One week of runny nose and persistent cough, accompanied by chills and frontal headache along with bilateral ear pain   History of multiple sinus surgeries including tube myringotomy.  Last surgery was 8 years ago,  Perforated ear drum history.  Last ENT visit was in [place].  Has seasonal allergies,  Was taking  allegra avoiding "D"   due to nerves .  Last night coughed all night along, a dry hacking but cough ,  Had an allergic reaction to tussionex with rash .       Past Medical History  Diagnosis Date  . Arthritis   . Depression   . Frequent headaches   . Migraine   . Psoriatic arthritis     Past Surgical History  Procedure Laterality Date  . Breast surgery  2000    biopsy  . Tonsillectomy and adenoidectomy  1985  . Ventricular atrial shunt Right 1995  . Tympanostomy tube placement  1998  . Nasal sinus surgery  1998       The following portions of the patient's history were reviewed and updated as appropriate: Allergies, current medications, and problem list.    Review of Systems:   Patient denies headache, fevers, malaise, unintentional weight loss, skin rash, eye pain, sinus congestion and sinus pain, sore throat, dysphagia,   hemoptysis , cough, dyspnea, wheezing, chest pain, palpitations, orthopnea, edema, abdominal pain, nausea, melena, diarrhea, constipation, flank pain, dysuria, hematuria, urinary  Frequency, nocturia, numbness, tingling, seizures,  Focal weakness, Loss of consciousness,  Tremor, insomnia, depression, anxiety, and suicidal ideation.     History   Social History  . Marital Status: Married    Spouse Name: [name]  . Number of Children: 2  . Years of Education: 14   Occupational History  .  Other   Social History Main Topics  . Smoking status: Never Smoker   . Smokeless tobacco: Never Used  . Alcohol Use: No  . Drug Use: No  . Sexual Activity: Yes   Other Topics Concern  . Not on file   Social History Narrative   [name] lives at home with her husband, [name], of 5 months and a cat named, Puff. She has 2 sons from a previous marriage. She has 4 grandchildren.     Objective:  Filed Vitals:   08/15/14 1631  BP: 126/78  Pulse: 78  Temp: 97.9 F (36.6 C)  Resp: 16     General appearance: alert, cooperative and appears stated age Ears: normal TM's and external ear canals both ears Throat: lips, mucosa, and tongue normal; teeth and gums normal Neck: no adenopathy, no carotid bruit, supple, symmetrical, trachea midline and thyroid not enlarged, symmetric,  no tenderness/mass/nodules Back: symmetric, no curvature. ROM normal. No CVA tenderness. Lungs: clear to auscultation bilaterally Heart: regular rate and rhythm, S1, S2 normal, no murmur, click, rub or gallop Abdomen: soft, non-tender; bowel sounds normal; no masses,  no organomegaly Pulses: 2+ and symmetric Skin: Skin color, texture, turgor normal. No rashes or lesions Lymph nodes: Cervical, supraclavicular, and axillary nodes normal.  Assessment and Plan:  Non-suppurative otitis media Given chronicity of symptoms, development of facial pain and exam consistent with bacterial URI,  Will treat with empiric antibiotics,  prednisone taper, decongestants, and saline lavage.  Advised to take a probiotic for 3 weeks     Updated Medication List Outpatient Encounter Prescriptions as of 08/15/2014  Medication Sig  . mometasone (NASONEX) 50 MCG/ACT nasal spray Place 2 sprays into the nose daily.  . Multiple Vitamins-Minerals (WOMENS MULTIVITAMIN PLUS PO) Take by mouth.  Marland Kitchen omeprazole (PRILOSEC) 40 MG capsule Take 1 capsule (40 mg total) by mouth daily.  Marland Kitchen PARoxetine (PAXIL) 10 MG tablet Take 1 tablet (10 mg total) by mouth daily.  . QUEtiapine (SEROQUEL) 200 MG tablet Take 1 tablet (200 mg total) by mouth at bedtime.  . valACYclovir (VALTREX) 1000 MG tablet Take 1 tablet (1,000 mg total) by mouth 3 (three) times daily as needed. X 7 days prn  . benzonatate (TESSALON) 200 MG capsule Take 1 capsule (200 mg total) by mouth 3 (three) times daily as needed for cough.  . fluconazole (DIFLUCAN) 150 MG tablet Take 1 tablet (150 mg total) by mouth daily.  Marland Kitchen levofloxacin (LEVAQUIN) 500 MG tablet Take 1 tablet (500 mg total) by mouth daily.  . predniSONE (DELTASONE) 10 MG tablet 6 tablets on Day 1 , then reduce by 1 tablet daily until gone     No orders of the defined types were placed in this encounter.    No Follow-up on file.

## 2014-08-15 NOTE — Patient Instructions (Signed)
I am treating you for sinusitis/otitis which is a complication from your viral infection due to  persistent sinus congestion.   I am prescribing an antibiotic (levaquin) and a prednisone taper  To manage the infection and the inflammation in your ear/sinuses.   I also advise use of the following OTC meds to help with your other symptoms.   Take generic OTC benadryl 25 mg in the evening for drainage,  Sudafed PE  10 to 30 mg every 8 hours IF NEEDED for the congestion, you may substitute Afrin nasal spray for the nighttime dose of sudafed PE  If needed to prevent insomnia.  Continue to flush your sinuses twice daily   Use benzonatate capsules  Every 8 hours for COUGH.   Please take a probiotic ( Align, Floraque or Culturelle) while you are on the antibiotic to prevent  the  serious antibiotic associated diarrhea  Called clostridium dificile colitis and a vaginal yeast infection

## 2014-08-16 ENCOUNTER — Telehealth: Payer: Self-pay | Admitting: *Deleted

## 2014-08-16 MED ORDER — AMOXICILLIN-POT CLAVULANATE 875-125 MG PO TABS: 1.0000 | ORAL_TABLET | Freq: Two times a day (BID) | ORAL | Status: DC

## 2014-08-16 NOTE — Telephone Encounter (Signed)
augmentin sent,  Dc levaquin

## 2014-08-16 NOTE — Telephone Encounter (Signed)
Pt and pharmacy notified.

## 2014-08-16 NOTE — Assessment & Plan Note (Signed)
Given chronicity of symptoms, development of facial pain and exam consistent with bacterial URI,  Will treat with empiric antibiotics, prednisone taper, decongestants, and saline lavage.  Advised to take a probiotic for 3 weeks

## 2014-08-16 NOTE — Telephone Encounter (Signed)
[facility] called, states Levaquin has drug interactions with her Seroquel and PAroxetine. Please advise

## 2014-08-16 NOTE — Telephone Encounter (Signed)
Pt called to check on status. Advised Dr. Derrel Nip out of office this am, a note has been sent. Requests call when medication ready

## 2014-10-07 ENCOUNTER — Telehealth: Payer: Self-pay

## 2014-10-07 NOTE — Telephone Encounter (Signed)
The pt called and is hoping for a refill of her paxil medication.  She states she is completely out.  Pharmacy - [facility]  [place] - [phone]

## 2014-10-10 ENCOUNTER — Other Ambulatory Visit: Payer: Self-pay | Admitting: Nurse Practitioner

## 2014-10-10 MED ORDER — PAROXETINE HCL 10 MG PO TABS: 10.0000 mg | ORAL_TABLET | Freq: Every day | ORAL | Status: DC

## 2014-10-20 ENCOUNTER — Telehealth: Payer: Self-pay

## 2014-10-20 ENCOUNTER — Other Ambulatory Visit: Payer: Self-pay | Admitting: Nurse Practitioner

## 2014-10-20 MED ORDER — QUETIAPINE FUMARATE 200 MG PO TABS: 200.0000 mg | ORAL_TABLET | Freq: Every day | ORAL | Status: DC

## 2014-10-20 NOTE — Telephone Encounter (Signed)
The pharmacy called asking for a refill of the patient's seroquel rx [facility]  Pharmacy phone - [phone]

## 2014-10-20 NOTE — Telephone Encounter (Signed)
Filled

## 2014-11-03 ENCOUNTER — Ambulatory Visit (INDEPENDENT_AMBULATORY_CARE_PROVIDER_SITE_OTHER): Payer: Medicare Other | Admitting: Nurse Practitioner

## 2014-11-03 VITALS — BP 124/82 | HR 78 | Temp 98.2°F | Resp 16 | Ht 65.5 in | Wt 181.8 lb

## 2014-11-03 DIAGNOSIS — Z1329 Encounter for screening for other suspected endocrine disorder: Secondary | ICD-10-CM | POA: Diagnosis not present

## 2014-11-03 DIAGNOSIS — R300 Dysuria: Secondary | ICD-10-CM | POA: Diagnosis not present

## 2014-11-03 DIAGNOSIS — R5381 Other malaise: Secondary | ICD-10-CM | POA: Diagnosis not present

## 2014-11-03 DIAGNOSIS — R61 Generalized hyperhidrosis: Secondary | ICD-10-CM | POA: Diagnosis not present

## 2014-11-03 LAB — BASIC METABOLIC PANEL WITH GFR
BUN: 18 mg/dL (ref 6–23)
CO2: 30 meq/L (ref 19–32)
Calcium: 9.9 mg/dL (ref 8.4–10.5)
Chloride: 103 meq/L (ref 96–112)
Creatinine, Ser: 0.78 mg/dL (ref 0.40–1.20)
GFR: 80.19 mL/min
Glucose, Bld: 84 mg/dL (ref 70–99)
Potassium: 4.9 meq/L (ref 3.5–5.1)
Sodium: 140 meq/L (ref 135–145)

## 2014-11-03 LAB — CBC WITH DIFFERENTIAL/PLATELET
Basophils Absolute: 0 K/uL (ref 0.0–0.1)
Basophils Relative: 0.4 % (ref 0.0–3.0)
Eosinophils Absolute: 0.1 K/uL (ref 0.0–0.7)
Eosinophils Relative: 1.4 % (ref 0.0–5.0)
HCT: 45.1 % (ref 36.0–46.0)
Hemoglobin: 15.2 g/dL — ABNORMAL HIGH (ref 12.0–15.0)
Lymphocytes Relative: 37 % (ref 12.0–46.0)
Lymphs Abs: 2.6 K/uL (ref 0.7–4.0)
MCHC: 33.8 g/dL (ref 30.0–36.0)
MCV: 84.1 fl (ref 78.0–100.0)
Monocytes Absolute: 0.7 K/uL (ref 0.1–1.0)
Monocytes Relative: 10.1 % (ref 3.0–12.0)
Neutro Abs: 3.5 K/uL (ref 1.4–7.7)
Neutrophils Relative %: 51.1 % (ref 43.0–77.0)
Platelets: 411 K/uL — ABNORMAL HIGH (ref 150.0–400.0)
RBC: 5.36 Mil/uL — ABNORMAL HIGH (ref 3.87–5.11)
RDW: 13.1 % (ref 11.5–15.5)
WBC: 6.9 K/uL (ref 4.0–10.5)

## 2014-11-03 LAB — POCT URINALYSIS DIPSTICK
Bilirubin, UA: NEGATIVE
Blood, UA: NEGATIVE
Glucose, UA: NEGATIVE
Ketones, UA: NEGATIVE
LEUKOCYTES UA: NEGATIVE
NITRITE UA: NEGATIVE
Protein, UA: NEGATIVE
SPEC GRAV UA: 1.025
Urobilinogen, UA: 0.2
pH, UA: 5.5

## 2014-11-03 LAB — TSH: TSH: 0.87 u[IU]/mL (ref 0.35–4.50)

## 2014-11-03 MED ORDER — GABAPENTIN 300 MG PO CAPS: 300.0000 mg | ORAL_CAPSULE | Freq: Every day | ORAL | Status: DC

## 2014-11-03 NOTE — Progress Notes (Signed)
   Subjective:    Patient ID: Michele Newton, female    DOB: 08-Apr-1956, 59 y.o.   MRN: [id number]  HPI  Michele Newton is a 59 yo female with a CC of hot flashes, UTI symptoms, and refills.   1) Hot flashes for a few years, having the more often at work, and break out in a sweat, has one daily and having 3-4 x a day, lasts about 5 min. Mood swings, fatigued  Worsening over the last few months.  Premarin- years prior  Estrogen like products in the past Paxil daily   2) Low back pain-  Urinating often 3 days, had a low grade fever she reports 99 and headaches including today.   3) Refills- Already has refills at the pharmacy of medications requested.   Review of Systems  Constitutional: Positive for diaphoresis. Negative for fever, chills and fatigue.  Respiratory: Negative for chest tightness, shortness of breath and wheezing.   Cardiovascular: Negative for chest pain, palpitations and leg swelling.  Gastrointestinal: Negative for nausea, vomiting and diarrhea.  Genitourinary: Positive for frequency.  Musculoskeletal: Positive for back pain.  Skin: Negative for rash.  Neurological: Negative for dizziness, weakness, numbness and headaches.  Psychiatric/Behavioral: The patient is not nervous/anxious.       Objective:   Physical Exam  Constitutional: She is oriented to person, place, and time. She appears well-developed and well-nourished. No distress.  BP 124/82 mmHg  Pulse 78  Temp(Src) 98.2 F (36.8 C)  Resp 16  Ht 5' 5.5" (1.664 m)  Wt 181 lb 12.8 oz (82.464 kg)  BMI 29.78 kg/m2  SpO2 98%   HENT:  Head: Normocephalic and atraumatic.  Right Ear: External ear normal.  Left Ear: External ear normal.  Cardiovascular: Normal rate, regular rhythm, normal heart sounds and intact distal pulses.  Exam reveals no gallop and no friction rub.   No murmur heard. Pulmonary/Chest: Effort normal and breath sounds normal. No respiratory distress. She has no wheezes. She has no rales. She  exhibits no tenderness.  Abdominal: There is no CVA tenderness.  Neurological: She is alert and oriented to person, place, and time. No cranial nerve deficit. She exhibits normal muscle tone. Coordination normal.  Skin: Skin is warm and dry. No rash noted. She is not diaphoretic.  Psychiatric: She has a normal mood and affect. Her behavior is normal. Judgment and thought content normal.      Assessment & Plan:   Dysuria   1) POCT urine negative 2) FU prn worsening/failure to improve.   Excessive sweating/Malaise/Screen for Thyroid Disorder  1) Will obtain CBC w/ diff, BMET, and TSH  2) Will try gabapentin for low back pain and for hot flashes 3) Follow up in 1 month

## 2014-11-03 NOTE — Patient Instructions (Signed)
Please visit the lab before leaving today.   Try Gabapentin 1 capsule at night time- this was found to be helpful for sleep and hot flashes.   Follow up in 1 month.

## 2014-11-03 NOTE — Progress Notes (Signed)
Pre visit review using our clinic review tool, if applicable. No additional management support is needed unless otherwise documented below in the visit note. 

## 2014-11-16 ENCOUNTER — Encounter: Payer: Self-pay | Admitting: Nurse Practitioner

## 2014-12-02 ENCOUNTER — Ambulatory Visit: Payer: Medicare Other | Admitting: Nurse Practitioner

## 2014-12-18 DIAGNOSIS — R5383 Other fatigue: Secondary | ICD-10-CM | POA: Diagnosis not present

## 2014-12-18 DIAGNOSIS — R51 Headache: Secondary | ICD-10-CM | POA: Diagnosis not present

## 2014-12-19 ENCOUNTER — Other Ambulatory Visit: Payer: Self-pay | Admitting: Neurology

## 2014-12-19 DIAGNOSIS — R51 Headache: Secondary | ICD-10-CM | POA: Diagnosis not present

## 2014-12-19 DIAGNOSIS — Z982 Presence of cerebrospinal fluid drainage device: Secondary | ICD-10-CM | POA: Insufficient documentation

## 2014-12-19 DIAGNOSIS — R519 Headache, unspecified: Secondary | ICD-10-CM

## 2014-12-20 ENCOUNTER — Other Ambulatory Visit: Payer: Self-pay | Admitting: Neurology

## 2014-12-20 ENCOUNTER — Other Ambulatory Visit: Payer: Self-pay | Admitting: Nurse Practitioner

## 2014-12-21 ENCOUNTER — Ambulatory Visit
Admission: RE | Admit: 2014-12-21 | Discharge: 2014-12-21 | Disposition: A | Discharge status: Home / Self Care | Payer: Medicare Other | Source: Ambulatory Visit | Attending: Neurology | Admitting: Neurology

## 2014-12-21 ENCOUNTER — Other Ambulatory Visit: Payer: Self-pay | Admitting: Neurology

## 2014-12-21 DIAGNOSIS — R51 Headache: Secondary | ICD-10-CM | POA: Diagnosis not present

## 2014-12-21 DIAGNOSIS — R42 Dizziness and giddiness: Secondary | ICD-10-CM | POA: Diagnosis not present

## 2014-12-21 DIAGNOSIS — Z982 Presence of cerebrospinal fluid drainage device: Secondary | ICD-10-CM | POA: Diagnosis not present

## 2014-12-21 DIAGNOSIS — G44001 Cluster headache syndrome, unspecified, intractable: Secondary | ICD-10-CM

## 2014-12-21 DIAGNOSIS — R519 Headache, unspecified: Secondary | ICD-10-CM

## 2014-12-21 MED ORDER — GADOBENATE DIMEGLUMINE 529 MG/ML IV SOLN
20.0000 mL | Freq: Once | INTRAVENOUS | Status: AC | PRN
  Administered 2014-12-21: 17 mL via INTRAVENOUS

## 2014-12-29 ENCOUNTER — Other Ambulatory Visit: Payer: Self-pay | Admitting: Neurosurgery

## 2014-12-29 DIAGNOSIS — M47812 Spondylosis without myelopathy or radiculopathy, cervical region: Secondary | ICD-10-CM

## 2014-12-29 DIAGNOSIS — G919 Hydrocephalus, unspecified: Secondary | ICD-10-CM | POA: Diagnosis not present

## 2014-12-29 DIAGNOSIS — Z6829 Body mass index (BMI) 29.0-29.9, adult: Secondary | ICD-10-CM | POA: Diagnosis not present

## 2015-01-05 ENCOUNTER — Ambulatory Visit
Admission: RE | Admit: 2015-01-05 | Discharge: 2015-01-05 | Disposition: A | Discharge status: Home / Self Care | Payer: Medicare Other | Source: Ambulatory Visit | Attending: Neurosurgery | Admitting: Neurosurgery

## 2015-01-05 ENCOUNTER — Other Ambulatory Visit: Payer: Self-pay | Admitting: Nurse Practitioner

## 2015-01-05 DIAGNOSIS — M4802 Spinal stenosis, cervical region: Secondary | ICD-10-CM | POA: Diagnosis not present

## 2015-01-05 DIAGNOSIS — M5030 Other cervical disc degeneration, unspecified cervical region: Secondary | ICD-10-CM | POA: Diagnosis not present

## 2015-01-05 DIAGNOSIS — M47812 Spondylosis without myelopathy or radiculopathy, cervical region: Secondary | ICD-10-CM | POA: Diagnosis not present

## 2015-01-05 DIAGNOSIS — M2578 Osteophyte, vertebrae: Secondary | ICD-10-CM | POA: Diagnosis not present

## 2015-01-13 DIAGNOSIS — Z6829 Body mass index (BMI) 29.0-29.9, adult: Secondary | ICD-10-CM | POA: Diagnosis not present

## 2015-01-13 DIAGNOSIS — M47812 Spondylosis without myelopathy or radiculopathy, cervical region: Secondary | ICD-10-CM | POA: Diagnosis not present

## 2015-02-15 DIAGNOSIS — H7201 Central perforation of tympanic membrane, right ear: Secondary | ICD-10-CM | POA: Diagnosis not present

## 2015-02-15 DIAGNOSIS — H6691 Otitis media, unspecified, right ear: Secondary | ICD-10-CM | POA: Diagnosis not present

## 2015-02-15 DIAGNOSIS — J301 Allergic rhinitis due to pollen: Secondary | ICD-10-CM | POA: Diagnosis not present

## 2015-03-18 DIAGNOSIS — J0100 Acute maxillary sinusitis, unspecified: Secondary | ICD-10-CM | POA: Diagnosis not present

## 2015-04-05 ENCOUNTER — Other Ambulatory Visit: Payer: Self-pay | Admitting: Nurse Practitioner

## 2015-04-13 ENCOUNTER — Other Ambulatory Visit: Payer: Self-pay | Admitting: Nurse Practitioner

## 2015-05-04 ENCOUNTER — Other Ambulatory Visit: Payer: Self-pay | Admitting: Internal Medicine

## 2015-05-04 NOTE — Telephone Encounter (Signed)
Refilled

## 2015-06-22 ENCOUNTER — Encounter: Payer: Self-pay | Admitting: Nurse Practitioner

## 2015-06-22 ENCOUNTER — Ambulatory Visit (INDEPENDENT_AMBULATORY_CARE_PROVIDER_SITE_OTHER): Payer: Medicare Other | Admitting: Nurse Practitioner

## 2015-06-22 VITALS — BP 130/78 | HR 75 | Temp 98.2°F | Resp 12 | Ht 66.0 in | Wt 188.2 lb

## 2015-06-22 DIAGNOSIS — J0101 Acute recurrent maxillary sinusitis: Secondary | ICD-10-CM | POA: Diagnosis not present

## 2015-06-22 DIAGNOSIS — M79671 Pain in right foot: Secondary | ICD-10-CM

## 2015-06-22 MED ORDER — DOXYCYCLINE HYCLATE 100 MG PO TABS: 100.0000 mg | ORAL_TABLET | Freq: Two times a day (BID) | ORAL | Status: DC

## 2015-06-22 MED ORDER — ACYCLOVIR 5 % EX OINT: 1.0000 "application " | TOPICAL_OINTMENT | CUTANEOUS | Status: DC

## 2015-06-22 NOTE — Patient Instructions (Addendum)
Try the doxycyline twice daily for 7 days.  Please take a probiotic ( Align, Floraque or Culturelle) while you are on the antibiotic to prevent a serious antibiotic associated diarrhea  Called clostirudium dificile colitis and a vaginal yeast infection.   Continue your current over the counter regimen   We will call about your referral to podiatry

## 2015-06-22 NOTE — Progress Notes (Signed)
Pre-visit discussion using our clinic review tool. No additional management support is needed unless otherwise documented below in the visit note.  

## 2015-06-22 NOTE — Progress Notes (Signed)
Patient ID: Michele Newton, female    DOB: 1956-01-17  Age: 60 y.o. MRN: [id number]  CC: Acute Visit and Cough   HPI Leaha Rodell presents for CC of URI and right foot swelling.  1) Right Foot- swelling with standing for awhile  Right foot x-ray done last year by Dr. Marjory Lies 07/28/14 X-ray shows no concern   Onset- Since Dec worsening (has been over 1 year)  Location- top of foot  Duration - lasts for 1-2 hrs Characteristics- aching, burning, swelling  Aggravating factors-worse with standing for a long period of time, bad at night  Relieving factors- Pressing on it  Severity- Moderate   Grandson dropped a big flashlight on it  Bruised  Treatment:  Ice pack for awhile  Compression hose  Arch support  2) ENT referral in 2015 by R Rey for recurrent ear infections  03/18/15 at a [facility] facility for Acute Maxillary Sinusitis  Has not been back to see them  2 rounds of abx   Augmentin x 2 and prednisone x 2 (Urgent care and [facility]?)  Bad taste, coughing- yellow, thick white   Treatment to date:  Allergy OTC meds Saline rinse  Nasonex   History Kaira has a past medical history of Arthritis; Depression; Frequent headaches; Migraine; and Psoriatic arthritis ([place]).   She has past surgical history that includes Breast surgery (2000); Tonsillectomy and adenoidectomy (1985); Ventricular atrial shunt (Right, 1995); Tympanostomy tube placement (1998); and Nasal sinus surgery (1998).   Her family history includes Arthritis in her father and mother; Cancer in her father; Diabetes in her brother and mother; Heart disease (age of onset: 87) in her mother; Hyperlipidemia in her mother; Hypertension in her mother.She reports that she has never smoked. She has never used smokeless tobacco. She reports that she does not drink alcohol or use illicit drugs.  Outpatient Prescriptions Prior to Visit  Medication Sig Dispense Refill  . mometasone (NASONEX) 50 MCG/ACT  nasal spray Place 2 sprays into the nose daily. 17 g 6  . Multiple Vitamins-Minerals (WOMENS MULTIVITAMIN PLUS PO) Take by mouth.    Marland Kitchen omeprazole (PRILOSEC) 40 MG capsule Take 1 capsule (40 mg total) by mouth daily. 30 capsule 3  . PARoxetine (PAXIL) 10 MG tablet TAKE ONE TABLET BY MOUTH ONCE DAILY 30 tablet 3  . QUEtiapine (SEROQUEL) 200 MG tablet TAKE 1 TABLET BY MOUTH AT BEDTIME 30 tablet 3  . valACYclovir (VALTREX) 1000 MG tablet Take 1 tablet (1,000 mg total) by mouth 3 (three) times daily as needed. X 7 days prn 30 tablet 6  . levofloxacin (LEVAQUIN) 500 MG tablet Take 1 tablet (500 mg total) by mouth daily. 7 tablet 0  . benzonatate (TESSALON) 200 MG capsule Take 1 capsule (200 mg total) by mouth 3 (three) times daily as needed for cough. (Patient not taking: Reported on 06/22/2015) 30 capsule 0  . fluconazole (DIFLUCAN) 150 MG tablet Take 1 tablet (150 mg total) by mouth daily. (Patient not taking: Reported on 06/22/2015) 2 tablet 0  . amoxicillin-clavulanate (AUGMENTIN) 875-125 MG per tablet Take 1 tablet by mouth 2 (two) times daily. (Patient not taking: Reported on 06/22/2015) 14 tablet 0  . gabapentin (NEURONTIN) 300 MG capsule Take 1 capsule (300 mg total) by mouth at bedtime. (Patient not taking: Reported on 06/22/2015) 30 capsule 1  . predniSONE (DELTASONE) 10 MG tablet 6 tablets on Day 1 , then reduce by 1 tablet daily until gone (Patient not taking: Reported on 11/03/2014) 21  tablet 0   No facility-administered medications prior to visit.    ROS Review of Systems  Constitutional: Negative for fever, chills, diaphoresis and fatigue.  HENT: Positive for congestion, postnasal drip, rhinorrhea and sinus pressure. Negative for sneezing and sore throat.   Respiratory: Positive for cough. Negative for chest tightness, shortness of breath and wheezing.   Cardiovascular: Negative for chest pain, palpitations and leg swelling.  Gastrointestinal: Negative for nausea, vomiting and diarrhea.   Musculoskeletal: Positive for arthralgias.       Right foot pain   Skin: Negative for rash.  Neurological: Negative for dizziness, numbness and headaches.  Psychiatric/Behavioral: The patient is not nervous/anxious.    Objective:  BP 130/78 mmHg  Pulse 75  Temp(Src) 98.2 F (36.8 C) (Oral)  Resp 12  Ht 5\' 6"  (1.676 m)  Wt 188 lb 4 oz (85.39 kg)  BMI 30.40 kg/m2  SpO2 97%  Physical Exam  Constitutional: She is oriented to person, place, and time. She appears well-developed and well-nourished. No distress.  HENT:  Head: Normocephalic and atraumatic.  Right Ear: External ear normal.  Left Ear: External ear normal.  Mouth/Throat: No oropharyngeal exudate.  TMs clear bilaterally Nares boggy bilaterally Exquisite tenderness to maxillary sinuses  Eyes: EOM are normal. Pupils are equal, round, and reactive to light. Right eye exhibits no discharge. Left eye exhibits no discharge. No scleral icterus.  Neck: Normal range of motion. Neck supple.  Cardiovascular: Normal rate, regular rhythm, normal heart sounds and intact distal pulses.  Exam reveals no gallop and no friction rub.   No murmur heard. Pedal pulses 2+   Pulmonary/Chest: Effort normal and breath sounds normal. No respiratory distress. She has no wheezes. She has no rales. She exhibits no tenderness.  Musculoskeletal: Normal range of motion. She exhibits no edema or tenderness.  Right foot- Tender dorsal metatarsals, no tenderness of arch, toes, or heel. No discoloration or edema  Lymphadenopathy:    She has no cervical adenopathy.  Neurological: She is alert and oriented to person, place, and time. No cranial nerve deficit. She exhibits normal muscle tone. Coordination normal.  Skin: Skin is warm and dry. No rash noted. She is not diaphoretic.  Psychiatric: She has a normal mood and affect. Her behavior is normal. Judgment and thought content normal.   Assessment & Plan:   Levon was seen today for acute visit and  cough.  Diagnoses and all orders for this visit:  Right foot pain -     Ambulatory referral to Podiatry  Acute recurrent maxillary sinusitis  Other orders -     doxycycline (VIBRA-TABS) 100 MG tablet; Take 1 tablet (100 mg total) by mouth 2 (two) times daily. -     acyclovir ointment (ZOVIRAX) 5 %; Apply 1 application topically every 3 (three) hours.  I have discontinued Ms. Scarlett's levofloxacin, predniSONE, amoxicillin-clavulanate, and gabapentin. I am also having her start on doxycycline and acyclovir ointment. Additionally, I am having her maintain her Multiple Vitamins-Minerals (WOMENS MULTIVITAMIN PLUS PO), mometasone, valACYclovir, omeprazole, benzonatate, fluconazole, QUEtiapine, and PARoxetine.  Meds ordered this encounter  Medications  . doxycycline (VIBRA-TABS) 100 MG tablet    Sig: Take 1 tablet (100 mg total) by mouth 2 (two) times daily.    Dispense:  14 tablet    Refill:  0    Order Specific Question:  Supervising Provider    Answer:  Deborra Medina L [[id number]]  . acyclovir ointment (ZOVIRAX) 5 %    Sig: Apply 1 application topically every 3 (  three) hours.    Dispense:  30 g    Refill:  1    Order Specific Question:  Supervising Provider    Answer:  Crecencio Mc [[id number]]     Follow-up: Return if symptoms worsen or fail to improve.

## 2015-06-22 NOTE — Assessment & Plan Note (Addendum)
New problem to me After trauma to foot last year  X-ray last year was negative for fractures or subluxation Patient is agreeable to referral for podiatry to look at foot Patient was advised to use compression hose

## 2015-06-23 DIAGNOSIS — J0100 Acute maxillary sinusitis, unspecified: Secondary | ICD-10-CM | POA: Insufficient documentation

## 2015-06-23 NOTE — Assessment & Plan Note (Signed)
New problem to me Patient is artery seen in 2 different urgent cares and has been through 2 rounds of antibiotics and prednisone tapers. Patient has not tried doxycycline at this point this was sent to pharmacy advised continued use of Nettie pot Follow-up if worsening or failure to improve with ENT

## 2015-06-26 ENCOUNTER — Encounter: Payer: Self-pay | Admitting: Nurse Practitioner

## 2015-07-18 ENCOUNTER — Ambulatory Visit (INDEPENDENT_AMBULATORY_CARE_PROVIDER_SITE_OTHER): Payer: Medicare Other

## 2015-07-18 ENCOUNTER — Encounter: Payer: Self-pay | Admitting: Podiatry

## 2015-07-18 ENCOUNTER — Ambulatory Visit (INDEPENDENT_AMBULATORY_CARE_PROVIDER_SITE_OTHER): Payer: Medicare Other | Admitting: Podiatry

## 2015-07-18 VITALS — BP 135/82 | HR 74 | Resp 18

## 2015-07-18 DIAGNOSIS — M8430XA Stress fracture, unspecified site, initial encounter for fracture: Secondary | ICD-10-CM

## 2015-07-18 DIAGNOSIS — M216X9 Other acquired deformities of unspecified foot: Secondary | ICD-10-CM

## 2015-07-18 DIAGNOSIS — M722 Plantar fascial fibromatosis: Secondary | ICD-10-CM

## 2015-07-18 DIAGNOSIS — R52 Pain, unspecified: Secondary | ICD-10-CM | POA: Diagnosis not present

## 2015-07-18 NOTE — Progress Notes (Signed)
   Subjective:    Patient ID: Michele Newton, female    DOB: November 17, 1955, 60 y.o.   MRN: [id number]  HPI  60 year old female presents the office with concerns her right foot pain which been ongoing for the last year but has worsened over the last several weeks. She states that he gets intermittent swelling to the right foot as well as pain. She gets pain for which she points to the first and second metatarsals as well as the medial arch of the foot. She states that she is a high foot type. She denies any recent injury or trauma. She said no previous treatment. She last had an x-ray over a year ago by her primary care physician. No other complaints.  Review of Systems  All other systems reviewed and are negative.      Objective:   Physical Exam General: AAO x3, NAD  Dermatological: Skin is warm, dry and supple bilateral. Nails x 10 are well manicured; remaining integument appears unremarkable at this time. There are no open sores, no preulcerative lesions, no rash or signs of infection present.  Vascular: Dorsalis Pedis artery and Posterior Tibial artery pedal pulses are 2/4 bilateral with immedate capillary fill time. Pedal hair growth present. No varicosities and no lower extremity edema present bilateral. There is no pain with calf compression, swelling, warmth, erythema.   Neruologic: Grossly intact via light touch bilateral. Vibratory intact via tuning fork bilateral. Protective threshold with Semmes Wienstein monofilament intact to all pedal sites bilateral. Patellar and Achilles deep tendon reflexes 2+ bilateral. No Babinski or clonus noted bilateral.   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 There is tenderness on the right second metatarsal midshaft and is also pain the vibratory sensation overlying this area. There is trace edema overlying the area without any erythema or warmth. There is also mild discomfort on the medial band of the plantar fascia within the arch of the  foot on the right side. There is no areas of tenderness to bilateral lower extremities. MMT 5/5.  Gait: Unassisted, Nonantalgic.      Assessment & Plan:  60 year old female right second metatarsal stress fracture, cavus foot type -Treatment options discussed including all alternatives, risks, and complications -Etiology of symptoms were discussed -X-rays were obtained and reviewed with the patient. This concern for spiral fracture of the second metatarsal shafts. On the oblique view there is also indistinctness of the cortex consistent with a possible early stress fracture. Cavus foot type present. -At this time recommend immobilization in a cam boot which dispensed today. Limited activity. -Long-term discussed custom orthotics. -Follow-up in 2-3 weeks or sooner if any issues are to arise.  Celesta Gentile, DPM

## 2015-07-19 ENCOUNTER — Telehealth: Payer: Self-pay | Admitting: *Deleted

## 2015-07-19 MED ORDER — MELOXICAM 7.5 MG PO TABS: 7.5000 mg | ORAL_TABLET | Freq: Every day | ORAL | Status: DC

## 2015-07-19 NOTE — Addendum Note (Signed)
Addended by: Celesta Gentile R on: 07/19/2015 01:18 PM   Modules accepted: Orders

## 2015-07-19 NOTE — Telephone Encounter (Signed)
Patient called and stated that the boot may be a little too big and that patient was to get anti-inflammatory, and i stated to come tomorrow morning to exchange the boot due to I will be in our [place] office today. Michele Newton

## 2015-07-20 ENCOUNTER — Telehealth: Payer: Self-pay | Admitting: *Deleted

## 2015-07-20 NOTE — Telephone Encounter (Signed)
Patient came by the office today and stated that the boot that was dispensed was too big and wanted to see if we could go to a smaller size and when I tried the small boot on the patient it fit better. Lattie Haw

## 2015-08-08 ENCOUNTER — Ambulatory Visit (INDEPENDENT_AMBULATORY_CARE_PROVIDER_SITE_OTHER): Payer: Medicare Other

## 2015-08-08 ENCOUNTER — Encounter: Payer: Self-pay | Admitting: Podiatry

## 2015-08-08 ENCOUNTER — Ambulatory Visit (INDEPENDENT_AMBULATORY_CARE_PROVIDER_SITE_OTHER): Payer: Medicare Other | Admitting: Podiatry

## 2015-08-08 VITALS — BP 120/83 | HR 89 | Resp 18

## 2015-08-08 DIAGNOSIS — M722 Plantar fascial fibromatosis: Secondary | ICD-10-CM | POA: Diagnosis not present

## 2015-08-08 DIAGNOSIS — M216X9 Other acquired deformities of unspecified foot: Secondary | ICD-10-CM | POA: Diagnosis not present

## 2015-08-08 DIAGNOSIS — M8430XA Stress fracture, unspecified site, initial encounter for fracture: Secondary | ICD-10-CM

## 2015-08-08 DIAGNOSIS — M8430XD Stress fracture, unspecified site, subsequent encounter for fracture with routine healing: Secondary | ICD-10-CM

## 2015-08-08 NOTE — Progress Notes (Signed)
Patient ID: Michele Newton, female   DOB: 02/20/1956, 60 y.o.   MRN: [id number]  Subjective: 60 year old female presents the office today for Evaluation of right foot pain. Cefazolin he should remain in the cam boot. She states the pain has improved since last appointment although she still gets some pain. She also states that she was given some pain in the arch the femoral left side last week but that quickly resolved. Denies any recent injury or trauma. She states the swelling has resolved the right foot last appointment. Denies any systemic complaints such as fevers, chills, nausea, vomiting. No acute changes since last appointment, and no other complaints at this time.   Objective: AAO x3, NAD DP/PT pulses palpable bilaterally, CRT less than 3 seconds Protective sensation intact with Derrel Nip monofilament There is an increase in calcaneal inclination angle bilaterally. There is tenderness on the medial been the plantar fashion the arch of the foot bilaterally. There is no tenderness to the first metatarsal how there is some tenderness on the second metatarsal distally. There is also pain in the first interspace. There is no overlying edema, erythema, increase in warmth today. No other areas of pinpoint bony tenderness or pain with vibratory sensation. MMT 5/5, ROM WNL. No edema, erythema, increase in warmth to bilateral lower extremities.  No open lesions or pre-ulcerative lesions.  No pain with calf compression, swelling, warmth, erythema  Assessment: 60 year old female with resolving right foot pain  Plan: -All treatment options discussed with the patient including all alternatives, risks, complications.  -X-rays were obtained and reviewed today. No definitive evidence of acute fracture or stress fracture identified. -She can start to transition to regular shoe as tolerated. However there is any increase in pain, swelling or redness of the foot that she should return back to the cam boot  and call the office. -She is tried numerous over-the-counter inserts without any relief. Because of this I recommend custom inserts. She was scanned for orthotics and they were sent to [facility] labs. -Patient encouraged to call the office with any questions, concerns, change in symptoms.  -Follow up in 3 weeks to pick up orthotics and recheck of the foot. *If she is still having pain in the right foot x-ray next appointment.  Celesta Gentile, DPM

## 2015-08-11 ENCOUNTER — Other Ambulatory Visit: Payer: Self-pay | Admitting: Nurse Practitioner

## 2015-08-29 ENCOUNTER — Ambulatory Visit (INDEPENDENT_AMBULATORY_CARE_PROVIDER_SITE_OTHER): Payer: Medicare Other

## 2015-08-29 ENCOUNTER — Ambulatory Visit (INDEPENDENT_AMBULATORY_CARE_PROVIDER_SITE_OTHER): Payer: Medicare Other | Admitting: Podiatry

## 2015-08-29 VITALS — BP 150/90 | HR 73 | Resp 18

## 2015-08-29 DIAGNOSIS — M216X9 Other acquired deformities of unspecified foot: Secondary | ICD-10-CM | POA: Diagnosis not present

## 2015-08-29 DIAGNOSIS — M722 Plantar fascial fibromatosis: Secondary | ICD-10-CM

## 2015-08-29 DIAGNOSIS — M79671 Pain in right foot: Secondary | ICD-10-CM | POA: Diagnosis not present

## 2015-08-29 DIAGNOSIS — R52 Pain, unspecified: Secondary | ICD-10-CM

## 2015-08-29 NOTE — Patient Instructions (Signed)

## 2015-08-29 NOTE — Progress Notes (Signed)
Patient ID: Michele Newton, female   DOB: 02-16-1956, 60 y.o.   MRN: [id number]  Subjective: 60 year old female presents the office today for follow-up evaluation of right foot pain. She states that the pain she was getting is much better. She gets some burning in the arch of her right foot if she does a lot of walking or standing out of the boot. Otherwise, she states that she is improving. No recent injury or trauma. No other complaints.   Objective: AAO x3, NAD DP/PT pulses palpable bilaterally, CRT less than 3 seconds Protective sensation intact with Derrel Nip monofilament There is an increase in calcaneal inclination angle bilaterally. There is currently no tenderness on the medial band of the plantar fashion the arch of the foot bilaterally. There is mild tenderness to the first interspace. No area of pinpoint tenderness. No swelling or redness. No erythema or increase in warmth.  No other areas of pinpoint bony tenderness or pain with vibratory sensation. MMT 5/5, ROM WNL. No edema, erythema, increase in warmth to bilateral lower extremities.  No open lesions or pre-ulcerative lesions.  No pain with calf compression, swelling, warmth, erythema  Assessment: 60 year old female with resolving right foot pain  Plan: -All treatment options discussed with the patient including all alternatives, risks, complications.  -X-rays were obtained and reviewed with the patient. No evidence of acute fracture or stress fracture.  -Orthotics dispensed. Oral and written instructions were discussed. -Mobic prn -Ice -Follow-up in 4 weeks or sooner if any problems arise. In the meantime, encouraged to call the office with any questions, concerns, change in symptoms.   Celesta Gentile, DPM

## 2015-09-05 ENCOUNTER — Other Ambulatory Visit: Payer: Self-pay | Admitting: Nurse Practitioner

## 2015-09-28 ENCOUNTER — Ambulatory Visit: Payer: Medicare Other | Admitting: Podiatry

## 2015-09-29 ENCOUNTER — Ambulatory Visit (INDEPENDENT_AMBULATORY_CARE_PROVIDER_SITE_OTHER): Payer: Medicare Other | Admitting: Family Medicine

## 2015-09-29 VITALS — BP 110/74 | HR 74 | Temp 98.4°F | Wt 188.2 lb

## 2015-09-29 DIAGNOSIS — F32A Depression, unspecified: Secondary | ICD-10-CM

## 2015-09-29 DIAGNOSIS — F419 Anxiety disorder, unspecified: Secondary | ICD-10-CM

## 2015-09-29 DIAGNOSIS — Z13220 Encounter for screening for lipoid disorders: Secondary | ICD-10-CM | POA: Diagnosis not present

## 2015-09-29 DIAGNOSIS — Z5181 Encounter for therapeutic drug level monitoring: Secondary | ICD-10-CM | POA: Diagnosis not present

## 2015-09-29 DIAGNOSIS — F329 Major depressive disorder, single episode, unspecified: Secondary | ICD-10-CM | POA: Diagnosis not present

## 2015-09-29 DIAGNOSIS — Z8619 Personal history of other infectious and parasitic diseases: Secondary | ICD-10-CM

## 2015-09-29 DIAGNOSIS — J0101 Acute recurrent maxillary sinusitis: Secondary | ICD-10-CM

## 2015-09-29 DIAGNOSIS — F418 Other specified anxiety disorders: Secondary | ICD-10-CM

## 2015-09-29 DIAGNOSIS — E669 Obesity, unspecified: Secondary | ICD-10-CM

## 2015-09-29 MED ORDER — MELOXICAM 7.5 MG PO TABS: 7.5000 mg | ORAL_TABLET | Freq: Every day | ORAL | Status: DC

## 2015-09-29 MED ORDER — PAROXETINE HCL 20 MG PO TABS: 20.0000 mg | ORAL_TABLET | Freq: Every day | ORAL | Status: DC

## 2015-09-29 MED ORDER — QUETIAPINE FUMARATE 200 MG PO TABS: 200.0000 mg | ORAL_TABLET | Freq: Every day | ORAL | Status: DC

## 2015-09-29 MED ORDER — MOMETASONE FUROATE 50 MCG/ACT NA SUSP: 2.0000 | Freq: Every day | NASAL | Status: DC

## 2015-09-29 MED ORDER — PAROXETINE HCL 10 MG PO TABS: 10.0000 mg | ORAL_TABLET | Freq: Every day | ORAL | Status: DC

## 2015-09-29 MED ORDER — VALACYCLOVIR HCL 1 G PO TABS: 1000.0000 mg | ORAL_TABLET | Freq: Three times a day (TID) | ORAL | Status: DC | PRN

## 2015-09-29 MED ORDER — AMOXICILLIN-POT CLAVULANATE 875-125 MG PO TABS: 1.0000 | ORAL_TABLET | Freq: Two times a day (BID) | ORAL | Status: DC

## 2015-09-29 NOTE — Patient Instructions (Signed)
Nice to meet you. I have refilled her medications. We will obtain some lab work at a future date when your fasting to monitor given that you're on Seroquel. We will treat your sinus infection with Augmentin. If you develop worsening depression or anxiety, thoughts of harming herself or others, fevers, cough, trouble breathing, or any new or changing symptoms please seek medical attention.

## 2015-10-01 ENCOUNTER — Encounter: Payer: Self-pay | Admitting: Family Medicine

## 2015-10-01 DIAGNOSIS — Z8619 Personal history of other infectious and parasitic diseases: Secondary | ICD-10-CM | POA: Insufficient documentation

## 2015-10-01 NOTE — Assessment & Plan Note (Signed)
Provided with a refill of Valtrex.

## 2015-10-01 NOTE — Assessment & Plan Note (Addendum)
Patient with uncontrolled anxiety and depression. No SI or HI. Discussed options for further treatment. Seroquel refilled. Paxil will be increased to 20 mg daily. Offered therapy she declined this. She'll monitor. If not improving in the next month she'll follow-up. Patient would prefer if improving to space follow-up out. Medication monitoring lab work ordered per below given that she is on Seroquel. Given return precautions.

## 2015-10-01 NOTE — Progress Notes (Signed)
Patient ID: Michele Newton, female   DOB: 11/29/1955, 59 y.o.   MRN: [id number]  Eric Sonnenberg, MD Phone: [phone]  Michele Newton is a 59 y.o. female who presents today for follow-up.  Sinus infection: Patient notes history of chronic intermittent sinus infections. Notes significant sinus pressure in her frontal sinuses. Both ears feel full. Some postnasal drip and cough. Has been going on for 2 weeks and getting worse. Some chills and sweats though no fevers. Has been using Nasonex intermittently and Tylenol Sinus. Has had multiple sinus surgeries in the past. 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 Followed by ENT.  Depression/anxiety: Patient notes lots of anxiety. Notes this has not improved much recently. Has been on Seroquel for greater than 10 years. Paxil for the last 3-4 years. Has seen a therapist in the past that did not feel this was helpful. Seen a psychiatrist in the past though was unsure if this is helpful either. No SI or HI.  GAD 7 : Generalized Anxiety Score 10/01/2015  Nervous, Anxious, on Edge 3  Control/stop worrying 2  Worry too much - different things 2  Trouble relaxing 2  Restless 0  Easily annoyed or irritable 3  Afraid - awful might happen 1  Total GAD 7 Score 13  Anxiety Difficulty Somewhat difficult   Depression screen PHQ 2/9 10/01/2015 02/11/2013  Decreased Interest 1 3  Down, Depressed, Hopeless 1 3  PHQ - 2 Score 2 6  Altered sleeping 1 0  Tired, decreased energy 2 3  Change in appetite 2 0  Feeling bad or failure about yourself  1 0  Trouble concentrating 1 0  Moving slowly or fidgety/restless 0 0  Suicidal thoughts 0 0  PHQ-9 Score 9 9   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 Has had a prescription for Valtrex to have on hand for some time. Has not used in a while in her prescription has become out of date. No symptoms at this time.  PMH: nonsmoker.   ROS see history of present illness  Objective  Physical  Exam Filed Vitals:   09/29/15 1103  BP: 110/74  Pulse: 74  Temp: 98.4 F (36.9 C)    BP Readings from Last 3 Encounters:  09/29/15 110/74  08/29/15 150/90  08/08/15 120/83   Wt Readings from Last 3 Encounters:  09/29/15 188 lb 3.2 oz (85.367 kg)  06/22/15 188 lb 4 oz (85.39 kg)  11/03/14 181 lb 12.8 oz (82.464 kg)    Physical Exam  Constitutional: She is well-developed, well-nourished, and in no distress.  HENT:  Head: Normocephalic and atraumatic.  Right Ear: External ear normal.  Left Ear: External ear normal.  Mouth/Throat: Oropharynx is clear and moist. No oropharyngeal exudate.  Right TM with known perforation per patient followed by ENT, left TM normal, bilateral frontal sinuses tender to percussion  Eyes: Conjunctivae are normal. Pupils are equal, round, and reactive to light.  Neck: Neck supple.  Cardiovascular: Normal rate, regular rhythm and normal heart sounds.   Pulmonary/Chest: Effort normal and breath sounds normal.  Lymphadenopathy:    She has no cervical adenopathy.  Neurological: She is alert. Gait normal.  Skin: Skin is warm and dry. She is not diaphoretic.  Psychiatric:  Mood anxious affect anxious     Assessment/Plan: Please see individual problem list.  Anxiety and depression Patient with uncontrolled anxiety and depression. No SI or HI. Discussed options for further treatment. Seroquel refilled. Paxil will be increased to 20 mg daily. Offered   therapy she declined this. She'll monitor. If not improving in the next month she'll follow-up. Patient would prefer if improving to space follow-up out. Medication monitoring lab work ordered per below given that she is on Seroquel. Given return precautions.  Maxillary sinusitis, acute Symptoms induration most consistent with bacterial sinusitis. We will treat with Augmentin. Yogurt or probiotics while taking Augmentin. She is given return precautions.  H/O cold sores Provided with a refill of  Valtrex.    Orders Placed This Encounter  Procedures  . CBC    Standing Status: Future     Number of Occurrences:      Standing Expiration Date: 09/28/2016  . Comp Met (CMET)    Standing Status: Future     Number of Occurrences:      Standing Expiration Date: 09/28/2016  . TSH    Standing Status: Future     Number of Occurrences:      Standing Expiration Date: 09/28/2016  . Lipid Profile    Standing Status: Future     Number of Occurrences:      Standing Expiration Date: 09/28/2016    Meds ordered this encounter  Medications  . amoxicillin-clavulanate (AUGMENTIN) 875-125 MG tablet    Sig: Take 1 tablet by mouth 2 (two) times daily.    Dispense:  14 tablet    Refill:  0  . valACYclovir (VALTREX) 1000 MG tablet    Sig: Take 1 tablet (1,000 mg total) by mouth 3 (three) times daily as needed. X 7 days prn    Dispense:  30 tablet    Refill:  6  . QUEtiapine (SEROQUEL) 200 MG tablet    Sig: Take 1 tablet (200 mg total) by mouth at bedtime.    Dispense:  30 tablet    Refill:  3  . DISCONTD: PARoxetine (PAXIL) 10 MG tablet    Sig: Take 1 tablet (10 mg total) by mouth daily.    Dispense:  30 tablet    Refill:  3  . mometasone (NASONEX) 50 MCG/ACT nasal spray    Sig: Place 2 sprays into the nose daily.    Dispense:  17 g    Refill:  6  . meloxicam (MOBIC) 7.5 MG tablet    Sig: Take 1 tablet (7.5 mg total) by mouth daily.    Dispense:  30 tablet    Refill:  0  . PARoxetine (PAXIL) 20 MG tablet    Sig: Take 1 tablet (20 mg total) by mouth daily.    Dispense:  30 tablet    Refill:  3    Eric Sonnenberg, MD [facility] [place]   

## 2015-10-01 NOTE — Assessment & Plan Note (Signed)
Symptoms induration most consistent with bacterial sinusitis. We will treat with Augmentin. Yogurt or probiotics while taking Augmentin. She is given return precautions.

## 2015-10-03 ENCOUNTER — Telehealth: Payer: Self-pay

## 2015-10-03 NOTE — Telephone Encounter (Signed)
PA for nasonex completed on Cover my meds.

## 2015-10-05 ENCOUNTER — Other Ambulatory Visit: Payer: Medicare Other

## 2015-10-12 ENCOUNTER — Ambulatory Visit (INDEPENDENT_AMBULATORY_CARE_PROVIDER_SITE_OTHER): Payer: Medicare Other | Admitting: Podiatry

## 2015-10-12 ENCOUNTER — Encounter: Payer: Self-pay | Admitting: Podiatry

## 2015-10-12 ENCOUNTER — Ambulatory Visit (INDEPENDENT_AMBULATORY_CARE_PROVIDER_SITE_OTHER): Payer: Medicare Other

## 2015-10-12 DIAGNOSIS — M722 Plantar fascial fibromatosis: Secondary | ICD-10-CM | POA: Diagnosis not present

## 2015-10-12 DIAGNOSIS — M1990 Unspecified osteoarthritis, unspecified site: Secondary | ICD-10-CM

## 2015-10-12 DIAGNOSIS — M779 Enthesopathy, unspecified: Secondary | ICD-10-CM | POA: Diagnosis not present

## 2015-10-12 DIAGNOSIS — R52 Pain, unspecified: Secondary | ICD-10-CM

## 2015-10-18 NOTE — Progress Notes (Signed)
Patient ID: Michele Newton, female   DOB: 30-Dec-1955, 60 y.o.   MRN: [id number]  Subjective: 60 year old female presents the office today for follow-up evaluation of right foot pain. She states the pain has improved over she does continue to get pain. The arch pain is resolving however she gets pain on the midfoot. Pain is worse with a lot of walking and standing. Otherwise, she states that she is improving. No recent injury or trauma. No other complaints.   Objective: AAO x3, NAD DP/PT pulses palpable bilaterally, CRT less than 3 seconds Protective sensation intact with Derrel Nip monofilament There is an increase in calcaneal inclination angle bilaterally. There is currently no tenderness on the medial band of the plantar fascia in the arch of the foot bilaterally. There is tenderness on the first metatarsal cuneiform joint on the medial aspect of the foot of the right side. There is no edema, erythema, increase in warmth. No other areas of tenderness bilaterally No edema, erythema, increase in warmth to bilateral lower extremities.  No open lesions or pre-ulcerative lesions.  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  -X-rays were obtained and reviewed with the patient. No evidence of acute fracture. There is operative changes present with first metatarsal cuneiform joint. -I discussed steroid injection to the area of maximal tenderness for which she wishes to proceed with. Under sterile conditions a mixture of local anesthetic and Kenalog was infiltrated into the area of maximal tenderness without complications. Post injection care was discussed. -Continue orthotics -Meloxicam as needed -Follow-up with her rheumatologist for psoriatic arthritis. She has been off the medication for some time but she  is having other joint pain. -Follow-up  if any problems arise or symptoms continue over the next couple weeks.. In the meantime, encouraged to call the office with any questions, concerns, change in symptoms.   Celesta Gentile, DPM

## 2015-11-13 ENCOUNTER — Ambulatory Visit (INDEPENDENT_AMBULATORY_CARE_PROVIDER_SITE_OTHER): Payer: Medicare Other | Admitting: Family Medicine

## 2015-11-13 DIAGNOSIS — J0101 Acute recurrent maxillary sinusitis: Secondary | ICD-10-CM

## 2015-11-13 MED ORDER — AMOXICILLIN-POT CLAVULANATE 875-125 MG PO TABS: 1.0000 | ORAL_TABLET | Freq: Two times a day (BID) | ORAL | Status: DC

## 2015-11-13 MED ORDER — MONTELUKAST SODIUM 10 MG PO TABS: 10.0000 mg | ORAL_TABLET | Freq: Every day | ORAL | Status: DC

## 2015-11-13 NOTE — Assessment & Plan Note (Signed)
Patient with recurrent maxillary sinusitis. Has had issues with this persistently for a number of years. Has followed with ENT previously and had surgery for sinuses and ears. Current symptoms lasting 7 days and given severity we'll treat with Augmentin. Start on Singulair for possible allergic component. If Singulair is not beneficial could consider seeing an allergist. If not improving over the next 3 days she will follow-up. Given return precautions.

## 2015-11-13 NOTE — Progress Notes (Signed)
Patient ID: Michele Newton, female   DOB: [birth year], 60 y.o.   MRN: [id number]  Tommi Rumps, MD Phone: [phone]  Michele Newton is a 60 y.o. female who presents today for same-day visit.  Recurrent sinusitis: Patient notes history of recurrent sinusitis. Notes for the last 7 days her ears have been feeling full. Notes frontal and maxillary sinus pressure headache. Coughing up some mild phlegm. Rhinorrhea and nasal congestion. Some chills though no fevers. No numbness or weakness or vision changes. No shortness of breath. Has been using Flonase though feels this doesn't work well. Has tried nonsedating antihistamines in the past with little benefit. Has had sinus surgeries and ear surgeries. Currently has a perforated tympanic membrane for which she has seen ENT for.   ROS see history of present illness  Objective  Physical Exam Filed Vitals:   11/13/15 1009  BP: 112/72  Pulse: 83  Temp: 98.2 F (36.8 C)    BP Readings from Last 3 Encounters:  11/13/15 112/72  09/29/15 110/74  08/29/15 150/90   Wt Readings from Last 3 Encounters:  11/13/15 187 lb 6.4 oz (85.004 kg)  09/29/15 188 lb 3.2 oz (85.367 kg)  06/22/15 188 lb 4 oz (85.39 kg)    Physical Exam  Constitutional: No distress.  HENT:  Head: Normocephalic and atraumatic.  Right Ear: External ear normal.  Left Ear: External ear normal.  Mouth/Throat: Oropharynx is clear and moist. No oropharyngeal exudate.  Left TM normal, right TM with perforation noted with no purulent drainage, bilateral frontal and maxillary sinuses tender to percussion  Eyes: Conjunctivae are normal. Pupils are equal, round, and reactive to light.  Cardiovascular: Normal rate, regular rhythm and normal heart sounds.   Pulmonary/Chest: Effort normal and breath sounds normal.  Neurological: She is alert.  CN 2-12 intact, 5/5 strength in bilateral biceps, triceps, grip, quads, hamstrings, plantar and dorsiflexion, sensation to light touch intact in  bilateral UE and LE, normal gait  Skin: Skin is warm and dry. She is not diaphoretic.     Assessment/Plan: Please see individual problem list.  Maxillary sinusitis, acute Patient with recurrent maxillary sinusitis. Has had issues with this persistently for a number of years. Has followed with ENT previously and had surgery for sinuses and ears. Current symptoms lasting 7 days and given severity we'll treat with Augmentin. Start on Singulair for possible allergic component. If Singulair is not beneficial could consider seeing an allergist. If not improving over the next 3 days she will follow-up. Given return precautions.    No orders of the defined types were placed in this encounter.    Meds ordered this encounter  Medications  . amoxicillin-clavulanate (AUGMENTIN) 875-125 MG tablet    Sig: Take 1 tablet by mouth 2 (two) times daily.    Dispense:  14 tablet    Refill:  0  . montelukast (SINGULAIR) 10 MG tablet    Sig: Take 1 tablet (10 mg total) by mouth at bedtime.    Dispense:  30 tablet    Refill:  2    Tommi Rumps, MD [place]

## 2015-11-13 NOTE — Progress Notes (Signed)
Pre visit review using our clinic review tool, if applicable. No additional management support is needed unless otherwise documented below in the visit note. 

## 2015-11-13 NOTE — Patient Instructions (Signed)
Nice to see you. You have a sinus infection. We are going to start you on Augmentin for this. We will also start you on Singulair to see if this will be helpful for your likely allergic component as well. If you develop numbness, weakness, vision changes, cough productive of blood, shortness of breath, or any new or changing symptoms please seek medical attention.

## 2015-11-16 ENCOUNTER — Ambulatory Visit: Payer: Medicare Other | Admitting: Podiatry

## 2015-12-21 ENCOUNTER — Encounter: Payer: Self-pay | Admitting: Family Medicine

## 2015-12-21 ENCOUNTER — Ambulatory Visit (INDEPENDENT_AMBULATORY_CARE_PROVIDER_SITE_OTHER): Payer: Medicare Other | Admitting: Family Medicine

## 2015-12-21 VITALS — BP 120/72 | HR 77 | Temp 97.8°F | Wt 190.0 lb

## 2015-12-21 DIAGNOSIS — M545 Low back pain, unspecified: Secondary | ICD-10-CM

## 2015-12-21 DIAGNOSIS — R3915 Urgency of urination: Secondary | ICD-10-CM

## 2015-12-21 DIAGNOSIS — J309 Allergic rhinitis, unspecified: Secondary | ICD-10-CM | POA: Diagnosis not present

## 2015-12-21 LAB — POCT URINALYSIS DIPSTICK
BILIRUBIN UA: NEGATIVE
Blood, UA: NEGATIVE
GLUCOSE UA: NEGATIVE
Ketones, UA: NEGATIVE
LEUKOCYTES UA: NEGATIVE
NITRITE UA: NEGATIVE
PH UA: 5.5
Protein, UA: NEGATIVE
Spec Grav, UA: >=1.03
UROBILINOGEN UA: 0.2

## 2015-12-21 MED ORDER — NAPROXEN 500 MG PO TABS: 500.0000 mg | ORAL_TABLET | Freq: Two times a day (BID) | ORAL | 0 refills | Status: DC

## 2015-12-21 NOTE — Assessment & Plan Note (Addendum)
New issue. No significant injury. Neurologically intact. No red flags. Suspect muscular strain. Doubt UTI. UA normal. We'll send urine for culture. Discussed heat and Naprosyn prescription strength. She'll take this with food. If he develops stomach upset or reflux she will stop and let us know. She is given return precautions.

## 2015-12-21 NOTE — Progress Notes (Signed)
  Tommi Rumps, MD Phone: [phone]  Michele Newton is a 60 y.o. female who presents today for same-day visit.  Low back pain: Patient notes this started bothering her on Monday. She mostly yard in a riding mower in [employer] maybe this bothered her back. Notes it is a dull ache across her lower back. No radiation. No numbness or weakness. No loss of bowel or bladder function, saddle anesthesia, or fevers. Some mild urinary urgency at times but nothing consistent and no other urinary complaints. Wonders if she may have a UTI.  Allergic rhinitis: Patient notes persistent sinus drainage and intermittent congestion. Occasionally her ears feel stopped up. Does cough some with some clear mucus. Has seen ENT and has been treated with multiple antibiotics throughout the years with no benefit. Is on Nasonex and Singulair. Wants to see an allergist.  PMH: nonsmoker.   ROS see history of present illness  Objective  Physical Exam Vitals:   12/21/15 1108  BP: 120/72  Pulse: 77  Temp: 97.8 F (36.6 C)    BP Readings from Last 3 Encounters:  12/21/15 120/72  11/13/15 112/72  09/29/15 110/74   Wt Readings from Last 3 Encounters:  12/21/15 190 lb (86.2 kg)  11/13/15 187 lb 6.4 oz (85 kg)  09/29/15 188 lb 3.2 oz (85.4 kg)    Physical Exam  Constitutional: No distress.  HENT:  Head: Normocephalic and atraumatic.  Mouth/Throat: Oropharynx is clear and moist. No oropharyngeal exudate.  Right TM with chronic perforation, no drainage or erythema, left TM normal  Eyes: Conjunctivae are normal. Pupils are equal, round, and reactive to light.  Cardiovascular: Normal rate, regular rhythm and normal heart sounds.   Pulmonary/Chest: Effort normal and breath sounds normal.  Musculoskeletal:  No midline spine tenderness, no midline spine step-off, mild muscular lumbar and sacral discomfort on palpation, no overlying skin changes  Neurological: She is alert. Gait normal.  5 out of 5 strength  bilateral quads, hamstrings, plantar flexion, and dorsiflexion, sensation to light touch intact in bilateral lower extremities  Skin: Skin is warm and dry. She is not diaphoretic.     Assessment/Plan: Please see individual problem list.  Back pain New issue. No significant injury. Neurologically intact. No red flags. Suspect muscular strain. Doubt UTI. UA normal. We'll send urine for culture. Discussed heat and Naprosyn prescription strength. She'll take this with food. If he develops stomach upset or reflux she will stop and let us know. She is given return precautions.  Rhinitis, allergic Chronic rhinitis issues with postnasal drip. Previously treated with multiple courses of antibiotics with little benefit. Once to proceed with allergy testing. We will refer to an allergist.   Orders Placed This Encounter  Procedures  . Urine Culture  . Ambulatory referral to Allergy    Referral Priority:   Routine    Referral Type:   Allergy Testing    Referral Reason:   Specialty Services Required    Requested Specialty:   Allergy    Number of Visits Requested:   1  . POCT Urinalysis Dipstick    Meds ordered this encounter  Medications  . naproxen (NAPROSYN) 500 MG tablet    Sig: Take 1 tablet (500 mg total) by mouth 2 (two) times daily with a meal.    Dispense:  30 tablet    Refill:  0   Tommi Rumps, MD [place]

## 2015-12-21 NOTE — Assessment & Plan Note (Signed)
Chronic rhinitis issues with postnasal drip. Previously treated with multiple courses of antibiotics with little benefit. Once to proceed with allergy testing. We will refer to an allergist.

## 2015-12-21 NOTE — Progress Notes (Signed)
Pre visit review using our clinic review tool, if applicable. No additional management support is needed unless otherwise documented below in the visit note. 

## 2015-12-21 NOTE — Patient Instructions (Signed)
Nice to see you. Your back discomfort is likely related to muscular strain. You should use heat on this and you can take the naproxen twice daily with food. We'll refer you to an allergist. If you develop worsening back pain, numbness, weakness, fevers, numbness in her legs, loss of bowel or bladder function, or any new or changing symptoms please seek medical attention.

## 2015-12-23 LAB — URINE CULTURE: ORGANISM ID, BACTERIA: NO GROWTH

## 2016-01-01 ENCOUNTER — Telehealth: Payer: Self-pay | Admitting: Family Medicine

## 2016-01-01 ENCOUNTER — Ambulatory Visit: Payer: Medicare Other | Admitting: Family Medicine

## 2016-01-01 NOTE — Telephone Encounter (Signed)
FYI/ Pt called stating she will not be able to come in for her appt today/ Pt stated that she forgot she had a appt and has another appt scheduled for today. Let me know if appt needs to be cancelled off the schedule. Thank you!

## 2016-01-01 NOTE — Telephone Encounter (Signed)
Appointment canceled on schedule

## 2016-02-02 ENCOUNTER — Other Ambulatory Visit: Payer: Self-pay | Admitting: Surgical

## 2016-02-02 MED ORDER — PAROXETINE HCL 20 MG PO TABS: 20.0000 mg | ORAL_TABLET | Freq: Every day | ORAL | 3 refills | Status: DC

## 2016-02-02 NOTE — Progress Notes (Signed)
Can we refill this? Patient last seen 09/29/15 for depression and last acute visit 12/21/15.

## 2016-02-02 NOTE — Progress Notes (Signed)
Refill sent to pharmacy.   

## 2016-02-26 ENCOUNTER — Ambulatory Visit (INDEPENDENT_AMBULATORY_CARE_PROVIDER_SITE_OTHER): Payer: Medicare Other | Admitting: Family Medicine

## 2016-02-26 ENCOUNTER — Encounter: Payer: Self-pay | Admitting: Family Medicine

## 2016-02-26 DIAGNOSIS — J0101 Acute recurrent maxillary sinusitis: Secondary | ICD-10-CM

## 2016-02-26 MED ORDER — FLUTICASONE PROPIONATE 50 MCG/ACT NA SUSP: 2.0000 | Freq: Every day | NASAL | 6 refills | Status: DC

## 2016-02-26 MED ORDER — AMOXICILLIN-POT CLAVULANATE 875-125 MG PO TABS: 1.0000 | ORAL_TABLET | Freq: Two times a day (BID) | ORAL | 0 refills | Status: DC

## 2016-02-26 NOTE — Patient Instructions (Signed)
Nice to see you. You likely have a sinus infection. We will treat you with Augmentin. You can also start on Flonase. If you develop fevers, cough productive of blood, shortness of breath, or any new or changing symptoms please seek medical attention immediately.

## 2016-02-26 NOTE — Progress Notes (Signed)
  Tommi Rumps, MD Phone: [phone]  Michele Newton is a 60 y.o. female who presents today for follow-up.  Sinusitis: Patient notes symptoms started 7 days ago. Have not been getting any better. Notes ear discomfort, sore throat, bilateral maxillary and frontal sinus congestion, cough productive of yellow mucus, and blowing yellow mucus out of her nose. She notes no fevers or shortness of breath. She's been taking over-the-counter Tylenol Sinus and Aleve with little benefit. She has a long history of sinus infections.   ROS see history of present illness  Objective  Physical Exam Vitals:   02/26/16 1022  BP: 126/66  Pulse: 85  Temp: 98.3 F (36.8 C)    BP Readings from Last 3 Encounters:  02/26/16 126/66  12/21/15 120/72  11/13/15 112/72   Wt Readings from Last 3 Encounters:  02/26/16 191 lb (86.6 kg)  12/21/15 190 lb (86.2 kg)  11/13/15 187 lb 6.4 oz (85 kg)    Physical Exam  Constitutional: She is well-developed, well-nourished, and in no distress.  HENT:  Head: Normocephalic and atraumatic.  Mouth/Throat: Oropharynx is clear and moist. No oropharyngeal exudate.  Right TM with chronic perforation with no drainage, left TM normal  Eyes: Conjunctivae are normal. Pupils are equal, round, and reactive to light.  Neck: Neck supple.  Cardiovascular: Normal rate, regular rhythm and normal heart sounds.   Pulmonary/Chest: Effort normal and breath sounds normal.  Lymphadenopathy:    She has no cervical adenopathy.  Neurological: She is alert. Gait normal.  Skin: Skin is warm and dry.     Assessment/Plan: Please see individual problem list.  Maxillary sinusitis, acute Patient's symptoms are consistent with sinusitis. Likely bacterial given duration of symptoms without improvement. We will start her on Augmentin. She'll also be switched to Flonase as Nasonex seems to not be working. She is given return precautions.   No orders of the defined types were placed in this  encounter.   Meds ordered this encounter  Medications  . amoxicillin-clavulanate (AUGMENTIN) 875-125 MG tablet    Sig: Take 1 tablet by mouth 2 (two) times daily.    Dispense:  14 tablet    Refill:  0  . fluticasone (FLONASE) 50 MCG/ACT nasal spray    Sig: Place 2 sprays into both nostrils daily.    Dispense:  16 g    Refill:  [place], MD [place]

## 2016-02-26 NOTE — Assessment & Plan Note (Signed)
Patient's symptoms are consistent with sinusitis. Likely bacterial given duration of symptoms without improvement. We will start her on Augmentin. She'll also be switched to Flonase as Nasonex seems to not be working. She is given return precautions.

## 2016-03-05 ENCOUNTER — Telehealth: Payer: Self-pay | Admitting: *Deleted

## 2016-03-05 NOTE — Telephone Encounter (Signed)
Pt was advised to call the office if her sinus infection did not get any better, she has completed her antibiotic. She continues with congestion , cough and sinus drainage. Pt contact [phone]

## 2016-03-05 NOTE — Telephone Encounter (Signed)
Please advise, thanks.

## 2016-03-06 NOTE — Telephone Encounter (Signed)
Please see if she's had any fevers or shortness of breath or cough productive of blood. If she has had these symptoms she'll need to be reevaluated. If her symptoms are the same as previously we could consider a different antibiotic such as doxycycline or Levaquin.

## 2016-03-06 NOTE — Telephone Encounter (Signed)
Spoke with patient,   No SOB, no fevers, same symptoms as last visit with congestion cough and sinus drainage.  Told her that a new antibiotic could be considered and she said either she would try.  Please send to [facility]. Thanks

## 2016-03-07 MED ORDER — DOXYCYCLINE HYCLATE 100 MG PO TABS: 100.0000 mg | ORAL_TABLET | Freq: Two times a day (BID) | ORAL | 0 refills | Status: DC

## 2016-03-07 NOTE — Telephone Encounter (Signed)
Pt called and stated that the new antibiotic was not at her pharmacy. Pt is getting sicker, has a sore throat this morning.  Please advise, thank you!  Call [place], [place].

## 2016-03-07 NOTE — Telephone Encounter (Signed)
Please advise 

## 2016-03-07 NOTE — Telephone Encounter (Signed)
Notified patient that RX is at pharmacy

## 2016-03-07 NOTE — Telephone Encounter (Signed)
I apologize. I have sent this to the pharmacy today.

## 2016-03-11 ENCOUNTER — Ambulatory Visit (INDEPENDENT_AMBULATORY_CARE_PROVIDER_SITE_OTHER): Payer: Medicare Other | Admitting: Internal Medicine

## 2016-03-11 ENCOUNTER — Telehealth: Payer: Self-pay | Admitting: Family Medicine

## 2016-03-11 VITALS — BP 140/82 | HR 102 | Temp 98.8°F | Resp 15 | Wt 192.2 lb

## 2016-03-11 DIAGNOSIS — R112 Nausea with vomiting, unspecified: Secondary | ICD-10-CM

## 2016-03-11 DIAGNOSIS — R1032 Left lower quadrant pain: Secondary | ICD-10-CM

## 2016-03-11 DIAGNOSIS — R197 Diarrhea, unspecified: Secondary | ICD-10-CM

## 2016-03-11 LAB — POCT URINALYSIS DIPSTICK
Glucose, UA: NEGATIVE
KETONES UA: NEGATIVE
Nitrite, UA: NEGATIVE
PH UA: 5.5
Urobilinogen, UA: 2

## 2016-03-11 MED ORDER — PROMETHAZINE HCL 12.5 MG PO TABS: 12.5000 mg | ORAL_TABLET | Freq: Three times a day (TID) | ORAL | 0 refills | Status: DC | PRN

## 2016-03-11 NOTE — Progress Notes (Signed)
Subjective:  Patient ID: Michele Newton, female    DOB: 1956/02/13  Age: 60 y.o. MRN: [id number]  CC: The primary encounter diagnosis was Non-intractable vomiting with nausea, unspecified vomiting type. Diagnoses of Abdominal pain, left lower quadrant and Diarrhea, unspecified type were also pertinent to this visit.  HPI Michele Newton presents for EVALUATION and treatment of abdominal pain accompanied by nausea, vomiting and diarrhea.  Symptoms started 3 days ago after her antibiotic regimen for sinusitis was changed from augmentin to doxycycline due to persistent symptoms of congestion  Sinus drainage and cough, despite nearly 7 days of antibiotic use.  Patient was not having any documented fevers or facial pain but has had recurrent sinusitis .  Started having nausea  With vomiting about 24 hours after taking first doxycycline dose and developed multiple loose stools and lower abdominal pain on Sunday.  Has not modified diet so has been unable to eat biscuits, chicken etc brought home by husband,  Not drinking very much water,  But keeping liquids down. Has been feeling dizzy with position change.   Has history of some form of colitis remotely. Has a cerebral shunt .  Describes the abdominal pain as severe ,  Bilateral lower quadrants.    Outpatient Medications Prior to Visit  Medication Sig Dispense Refill  . doxycycline (VIBRA-TABS) 100 MG tablet Take 1 tablet (100 mg total) by mouth 2 (two) times daily. 14 tablet 0  . fluticasone (FLONASE) 50 MCG/ACT nasal spray Place 2 sprays into both nostrils daily. 16 g 6  . meloxicam (MOBIC) 7.5 MG tablet Take 1 tablet (7.5 mg total) by mouth daily. (Patient not taking: Reported on 02/26/2016) 30 tablet 0  . montelukast (SINGULAIR) 10 MG tablet Take 1 tablet (10 mg total) by mouth at bedtime. (Patient not taking: Reported on 02/26/2016) 30 tablet 2  . Multiple Vitamins-Minerals (WOMENS MULTIVITAMIN PLUS PO) Take by mouth.    . naproxen (NAPROSYN) 500 MG tablet  Take 1 tablet (500 mg total) by mouth 2 (two) times daily with a meal. 30 tablet 0  . PARoxetine (PAXIL) 20 MG tablet Take 1 tablet (20 mg total) by mouth daily. 30 tablet 3  . QUEtiapine (SEROQUEL) 200 MG tablet Take 1 tablet (200 mg total) by mouth at bedtime. 30 tablet 3  . valACYclovir (VALTREX) 1000 MG tablet Take 1 tablet (1,000 mg total) by mouth 3 (three) times daily as needed. X 7 days prn 30 tablet 6   No facility-administered medications prior to visit.     Review of Systems;  Patient denies headache, fevers,, unintentional weight loss, skin rash, eye pain, sinus congestion and sinus pain, sore throat, dysphagia,  hemoptysis , cough, dyspnea, wheezing, chest pain, palpitations, orthopnea, edema, , melena,  constipation, flank pain, dysuria, hematuria, urinary  Frequency, nocturia, numbness, tingling, seizures,  Focal weakness, Loss of consciousness,  Tremor, insomnia, depression, anxiety, and suicidal ideation.      Objective:  BP 140/82 (BP Location: Left Arm, Cuff Size: Large)   Pulse (!) 102   Temp 98.8 F (37.1 C) (Oral)   Resp 15   Wt 192 lb 3.2 oz (87.2 kg)   SpO2 98%   BMI 31.02 kg/m   BP Readings from Last 3 Encounters:  03/11/16 140/82  02/26/16 126/66  12/21/15 120/72    Wt Readings from Last 3 Encounters:  03/11/16 192 lb 3.2 oz (87.2 kg)  02/26/16 191 lb (86.6 kg)  12/21/15 190 lb (86.2 kg)    General appearance: alert, cooperative and  appears stated age. Not toxic appearing , does not appear to be in distress.  Back: symmetric, no curvature. ROM normal. No CVA tenderness. Lungs: clear to auscultation bilaterally Heart: regular rate and rhythm, S1, S2 normal, no murmur, click, rub or gallop Abdomen: soft, tender in LLQ >RLQ,  bowel sounds normal; no masses,  no organomegaly Pulses: 2+ and symmetric Skin: Skin color, texture, turgor normal. Not diaphoretic, no  rashes or lesions Lymph nodes: Cervical, supraclavicular, and axillary nodes normal.  No  results found for: HGBA1C  Lab Results  Component Value Date   CREATININE 0.78 11/03/2014    Lab Results  Component Value Date   WBC 6.9 11/03/2014   HGB 15.2 (H) 11/03/2014   HCT 45.1 11/03/2014   PLT 411.0 (H) 11/03/2014   GLUCOSE 84 11/03/2014   NA 140 11/03/2014   K 4.9 11/03/2014   CL 103 11/03/2014   CREATININE 0.78 11/03/2014   BUN 18 11/03/2014   CO2 30 11/03/2014   TSH 0.87 11/03/2014    Mr Cervical Spine Wo Contrast  Result Date: 01/05/2015 CLINICAL DATA:  60 year old female with posterior headaches. Numbness and tingling left arm to hand. No weakness. Symptoms for 1 month. No known injury. No prior surgery. No history of cancer. Subsequent encounter. EXAM: MRI CERVICAL SPINE WITHOUT CONTRAST TECHNIQUE: Multiplanar, multisequence MR imaging of the cervical spine was performed. No intravenous contrast was administered. COMPARISON:  12/21/2014 brain MR. No comparison cervical spine MR. FINDINGS: Cervical medullary junction and visualized intracranial structures unremarkable. No focal cervical cord signal abnormality. Slightly prominent lingual tonsillar tissue more notable on the right without discrete mass identified. Scattered normal/top-normal size lymph nodes. Mild degenerative changes C1-2 articulation. C2-3:  Minimal right facet joint degenerative changes. C3-4:  Minimal bulge.  Minimal right foraminal narrowing. C4-5: Shallow disc osteophyte complex with slight narrowing ventral aspect of the thecal sac. Minimal to mild narrowing origin of the neural foramen greater on the right. C5-6: Mild broad-based disc osteophyte complex greatest left paracentral position. Minimal cord contact. Mild narrowing ventral aspect of the thecal sac. Mild foraminal narrowing greater on the right. C6-7: Shallow broad-based disc osteophyte complex slightly greater to the right. Minimal cord contact. Mild narrowing ventral aspect of the thecal sac. C7-T1:  Minimal bulge. 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 Summary of pertinent findings includes: C4-5 shallow disc osteophyte complex with slight narrowing ventral aspect of the thecal sac. Minimal to mild narrowing origin of the neural foramen greater on the right. C5-6 mild broad-based disc osteophyte complex greatest left paracentral position. Minimal cord contact. Mild narrowing ventral aspect of the thecal sac. Mild foraminal narrowing greater on the right. C6-7 shallow broad-based disc osteophyte complex slightly greater to the right. Minimal cord contact. Mild narrowing ventral aspect of the thecal sac. Slightly prominent lingual tonsillar tissue more notable on the right without discrete mass identified. Scattered normal/top-normal size lymph nodes. Electronically Signed   By: Genia Del M.D.   On: 01/05/2015 15:00    Assessment & Plan:   Problem List Items Addressed This Visit    Abdominal pain, left lower quadrant    Accompanied by RLQ pain, nausea ,recurrent vomiting and diarrhea  Of over 48 hours duration.  Patient is NOT ORTHOSTATIC or febrile and nontoxic appearing.  DDX includes intolerance of doxycycline, viral gastroenteritis,  Infectious colitis vs diverticulitis,  Advised to sotp doxyc,  Start clear liquid diet,  phenergran prn, and CT abd pelvis tomorrow if no improvement overnight.  Will go to  ER tonight if she develops fever, severe pain or inability to maintain adequate hydration.       Relevant Orders   POCT urinalysis dipstick (Completed)   Urinalysis, Routine w reflex microscopic (not at [facility])   Urine culture   Comprehensive metabolic panel   CT Abdomen Pelvis W Contrast    Other Visit Diagnoses    Non-intractable vomiting with nausea, unspecified vomiting type    -  Primary   Relevant Orders   CT Abdomen Pelvis W Contrast   Diarrhea, unspecified type       Relevant Orders   CBC with Differential/Platelet   Sedimentation rate   Comprehensive metabolic panel   Clostridium  difficile EIA   CT Abdomen Pelvis W Contrast      I am having Ms. Camuso start on promethazine. I am also having her maintain her Multiple Vitamins-Minerals (WOMENS MULTIVITAMIN PLUS PO), valACYclovir, QUEtiapine, meloxicam, montelukast, naproxen, PARoxetine, fluticasone, and doxycycline.  Meds ordered this encounter  Medications  . promethazine (PHENERGAN) 12.5 MG tablet    Sig: Take 1 tablet (12.5 mg total) by mouth every 8 (eight) hours as needed for nausea or vomiting.    Dispense:  30 tablet    Refill:  0    There are no discontinued medications.  Follow-up: No Follow-up on file.   Crecencio Mc, MD

## 2016-03-11 NOTE — Telephone Encounter (Signed)
Spoke with patient and she stated that since she started taking the doxycycline on Friday can not hold food down. She is having abdominal pain. She took one tablet this morning. I advised her not to take anymore until she heard from [place].

## 2016-03-11 NOTE — Telephone Encounter (Signed)
Pt called stating that she is having some issues with doxycycline (VIBRA-TABS) 100 MG tablet. She is having severe stomach pains, sharp pains and having issues eating, having loose stool and vomiting. Please advise, Thank you!  Call pt @ [phone]

## 2016-03-11 NOTE — Patient Instructions (Addendum)
Your symptoms may be due to a stomach virus ,  colitis from various causes, or a reaction to doxycyclline  STOP THE DOXYCYCLINE AND THE ALEVE/ADVIL   You can use tylenol 1000 mg every 12 hours   I have prescribed promethazine (phenergan) every 8 hours as needed for nausea  CLEAR LIQUID DIET UNTIL THE NAUSEA AND VOMITING RESOLVES.  (Jello, sprite/ginger ale, chicken broth,  gatorade)  BRAT DIET ONCE THE NAUSEA AND VOMITING RESOLVES (bananas, rice,  Apple sauce and toast)  IF YOU SPIKE A FEVER (ANYTHING OVER 100.5), OR ARE UNABLE TO KEEP DOWN LIQUIDS,  OR IF YOUr PAIN BECOMES PERSISTENT,  YOU WILL NEED TO GO TO THE ER TONIGHT'  I HAVE ORDERED A CT OF THE ABDOMEN AND PELVIS TO BE DONE TOMORROW.Marland Kitchen

## 2016-03-11 NOTE — Telephone Encounter (Signed)
I have scheduled patient to see Dr. Derrel Nip today at 6:30 PM

## 2016-03-11 NOTE — Telephone Encounter (Signed)
Patient should stop the doxycycline. Given her issues with the antibiotic I would suggest follow up in the office for evaluation. If she has continued to have abdominal pain she should be evaluated sooner than we can get her in to the office.

## 2016-03-11 NOTE — Progress Notes (Signed)
Pre visit review using our clinic review tool, if applicable. No additional management support is needed unless otherwise documented below in the visit note. 

## 2016-03-12 ENCOUNTER — Ambulatory Visit: Admission: RE | Admit: 2016-03-12 | Payer: Medicare Other | Source: Ambulatory Visit

## 2016-03-12 ENCOUNTER — Ambulatory Visit: Payer: Medicare Other

## 2016-03-12 ENCOUNTER — Ambulatory Visit: Payer: Medicare Other | Admitting: Family

## 2016-03-12 ENCOUNTER — Telehealth: Payer: Self-pay | Admitting: Internal Medicine

## 2016-03-12 DIAGNOSIS — R1032 Left lower quadrant pain: Secondary | ICD-10-CM | POA: Insufficient documentation

## 2016-03-12 LAB — CBC WITH DIFFERENTIAL/PLATELET
BASOS ABS: 0.1 10*3/uL (ref 0.0–0.1)
BASOS PCT: 0.4 % (ref 0.0–3.0)
EOS PCT: 0.8 % (ref 0.0–5.0)
Eosinophils Absolute: 0.1 10*3/uL (ref 0.0–0.7)
HEMATOCRIT: 42.1 % (ref 36.0–46.0)
Hemoglobin: 14.2 g/dL (ref 12.0–15.0)
LYMPHS PCT: 16.6 % (ref 12.0–46.0)
Lymphs Abs: 2.4 10*3/uL (ref 0.7–4.0)
MCHC: 33.7 g/dL (ref 30.0–36.0)
MCV: 84.9 fl (ref 78.0–100.0)
MONOS PCT: 6.5 % (ref 3.0–12.0)
Monocytes Absolute: 0.9 10*3/uL (ref 0.1–1.0)
NEUTROS ABS: 10.7 10*3/uL — AB (ref 1.4–7.7)
Neutrophils Relative %: 75.7 % (ref 43.0–77.0)
PLATELETS: 466 10*3/uL — AB (ref 150.0–400.0)
RBC: 4.95 Mil/uL (ref 3.87–5.11)
RDW: 13.3 % (ref 11.5–15.5)
WBC: 14.1 10*3/uL — ABNORMAL HIGH (ref 4.0–10.5)

## 2016-03-12 LAB — COMPREHENSIVE METABOLIC PANEL
ALBUMIN: 4.1 g/dL (ref 3.5–5.2)
ALT: 38 U/L — ABNORMAL HIGH (ref 0–35)
AST: 32 U/L (ref 0–37)
Alkaline Phosphatase: 113 U/L (ref 39–117)
BUN: 15 mg/dL (ref 6–23)
CALCIUM: 9.3 mg/dL (ref 8.4–10.5)
CHLORIDE: 105 meq/L (ref 96–112)
CO2: 26 mEq/L (ref 19–32)
CREATININE: 0.91 mg/dL (ref 0.40–1.20)
GFR: 66.82 mL/min (ref >60.00)
Glucose, Bld: 120 mg/dL — ABNORMAL HIGH (ref 70–99)
Potassium: 4 mEq/L (ref 3.5–5.1)
Sodium: 139 mEq/L (ref 135–145)
Total Bilirubin: 0.5 mg/dL (ref 0.2–1.2)
Total Protein: 7.4 g/dL (ref 6.0–8.3)

## 2016-03-12 LAB — SEDIMENTATION RATE: Sed Rate: 32 mm/hr — ABNORMAL HIGH (ref 0–30)

## 2016-03-12 NOTE — Assessment & Plan Note (Signed)
Accompanied by RLQ pain, nausea ,recurrent vomiting and diarrhea  Of over 48 hours duration.  Patient is NOT ORTHOSTATIC or febrile and nontoxic appearing.  DDX includes intolerance of doxycycline, viral gastroenteritis,  Infectious colitis vs diverticulitis,  Advised to sotp doxyc,  Start clear liquid diet,  phenergran prn, and CT abd pelvis tomorrow if no improvement overnight.  Will go to ER tonight if she develops fever, severe pain or inability to maintain adequate hydration.

## 2016-03-12 NOTE — Telephone Encounter (Signed)
Patient stated she is feeling better and would like to cancel CT.

## 2016-03-12 NOTE — Telephone Encounter (Signed)
I did not receive her labs from Monday night  ,  I had to go hunt for them .  They suggest infection ,  But I see that she cancelled the CT scan . Can you find out why?

## 2016-03-13 LAB — URINE CULTURE

## 2016-04-02 ENCOUNTER — Telehealth: Payer: Self-pay | Admitting: Family Medicine

## 2016-04-02 NOTE — Telephone Encounter (Signed)
Pt declined to get a AWV. Thank You!

## 2016-04-25 ENCOUNTER — Other Ambulatory Visit: Payer: Self-pay | Admitting: *Deleted

## 2016-04-25 MED ORDER — PAROXETINE HCL 20 MG PO TABS: 20.0000 mg | ORAL_TABLET | Freq: Every day | ORAL | 3 refills | Status: DC

## 2016-04-25 MED ORDER — QUETIAPINE FUMARATE 200 MG PO TABS: 200.0000 mg | ORAL_TABLET | Freq: Every day | ORAL | 3 refills | Status: DC

## 2016-04-25 NOTE — Telephone Encounter (Signed)
Sent to pharmacy. Please confirm the patient has been taking these on a daily basis. Thanks.

## 2016-04-25 NOTE — Telephone Encounter (Signed)
Last seen 02/26/16 last filled  paxil 02/02/16 30 3rf seroquel 09/29/15 30 3rf

## 2016-04-25 NOTE — Telephone Encounter (Signed)
Left message to return call 

## 2016-04-25 NOTE — Telephone Encounter (Signed)
Patient has requested a medication refill for Paxil and Seroquel  Patient requested to update her pharmacy to [facility] in [place]

## 2016-04-26 NOTE — Telephone Encounter (Signed)
Left message to return call 

## 2016-05-02 NOTE — Telephone Encounter (Signed)
Unable to reach patient.

## 2016-07-02 ENCOUNTER — Encounter: Payer: Self-pay | Admitting: Family

## 2016-07-02 ENCOUNTER — Ambulatory Visit (INDEPENDENT_AMBULATORY_CARE_PROVIDER_SITE_OTHER): Payer: Medicare Other | Admitting: Family

## 2016-07-02 VITALS — BP 126/84 | HR 82 | Temp 98.3°F | Ht 66.0 in | Wt 193.8 lb

## 2016-07-02 DIAGNOSIS — J029 Acute pharyngitis, unspecified: Secondary | ICD-10-CM

## 2016-07-02 DIAGNOSIS — R0981 Nasal congestion: Secondary | ICD-10-CM

## 2016-07-02 LAB — POCT RAPID STREP A (OFFICE): Rapid Strep A Screen: NEGATIVE

## 2016-07-02 MED ORDER — CEPHALEXIN 500 MG PO CAPS: 500.0000 mg | ORAL_CAPSULE | Freq: Two times a day (BID) | ORAL | 0 refills | Status: DC

## 2016-07-02 NOTE — Progress Notes (Signed)
Pre visit review using our clinic review tool, if applicable. No additional management support is needed unless otherwise documented below in the visit note. 

## 2016-07-02 NOTE — Progress Notes (Signed)
Subjective:    Patient ID: Michele Newton, female    DOB: February 05, 1956, 61 y.o.   MRN: [id number]  CC: Libbi Rathore is a 61 y.o. female who presents today for an acute visit.    HPI: CC: cough , sore throat x one week, unchanged. Endorses bilateral ear ache, nasal congestion, sinus pressure, chills ( since resolved). Tried tyleonol cold, amoxicillin ( 3 tabs one per day) with no relief.   No fever, sob, wheezing.   No lung disease. Non smoker      HISTORY:  Past Medical History:  Diagnosis Date  . Arthritis   . Depression   . Frequent headaches   . Migraine   . Psoriatic arthritis [facility])    Past Surgical History:  Procedure Laterality Date  . BREAST SURGERY  2000   biopsy  . NASAL SINUS SURGERY  1998  . TONSILLECTOMY AND ADENOIDECTOMY  1985  . TYMPANOSTOMY TUBE PLACEMENT  1998  . VENTRICULAR ATRIAL SHUNT Right 1995   Family History  Problem Relation Age of Onset  . Arthritis Mother   . Hyperlipidemia Mother   . Heart disease Mother 24    Died of heart failure  . Hypertension Mother   . Diabetes Mother   . Arthritis Father   . Cancer Father     prostate cancer  . Diabetes Brother     Allergies: Amitriptyline; Effexor [venlafaxine]; Haldol [haloperidol]; and Vicodin [hydrocodone-acetaminophen] Current Outpatient Prescriptions on File Prior to Visit  Medication Sig Dispense Refill  . fluticasone (FLONASE) 50 MCG/ACT nasal spray Place 2 sprays into both nostrils daily. 16 g 6  . montelukast (SINGULAIR) 10 MG tablet Take 1 tablet (10 mg total) by mouth at bedtime. 30 tablet 2  . naproxen (NAPROSYN) 500 MG tablet Take 1 tablet (500 mg total) by mouth 2 (two) times daily with a meal. 30 tablet 0  . PARoxetine (PAXIL) 20 MG tablet Take 1 tablet (20 mg total) by mouth daily. 30 tablet 3  . QUEtiapine (SEROQUEL) 200 MG tablet Take 1 tablet (200 mg total) by mouth at bedtime. 30 tablet 3  . valACYclovir (VALTREX) 1000 MG tablet Take 1 tablet (1,000 mg total) by mouth 3 (three)  times daily as needed. X 7 days prn 30 tablet 6   No current facility-administered medications on file prior to visit.     Social History  Substance Use Topics  . Smoking status: Never Smoker  . Smokeless tobacco: Never Used  . Alcohol use No    Review of Systems  Constitutional: Positive for chills (resolved). Negative for fever.  HENT: Positive for ear pain, sinus pressure and sore throat. Negative for sinus pain.   Respiratory: Positive for cough. Negative for shortness of breath and wheezing.   Cardiovascular: Negative for chest pain and palpitations.  Gastrointestinal: Negative for nausea and vomiting.      Objective:    BP 126/84   Pulse 82   Temp 98.3 F (36.8 C) (Oral)   Ht 5\' 6"  (1.676 m)   Wt 193 lb 12.8 oz (87.9 kg)   SpO2 96%   BMI 31.28 kg/m    Physical Exam  Constitutional: She appears well-developed and well-nourished.  HENT:  Head: Normocephalic and atraumatic.  Right Ear: Hearing, tympanic membrane, external ear and ear canal normal. No drainage, swelling or tenderness. No foreign bodies. Tympanic membrane is not erythematous and not bulging. No middle ear effusion. No decreased hearing is noted.  Left Ear: Hearing, tympanic membrane, external ear and ear  canal normal. No drainage, swelling or tenderness. No foreign bodies. Tympanic membrane is not erythematous and not bulging.  No middle ear effusion. No decreased hearing is noted.  Nose: Nose normal. No rhinorrhea. Right sinus exhibits no maxillary sinus tenderness and no frontal sinus tenderness. Left sinus exhibits no maxillary sinus tenderness and no frontal sinus tenderness.  Mouth/Throat: Uvula is midline, oropharynx is clear and moist and mucous membranes are normal. No oropharyngeal exudate, posterior oropharyngeal edema, posterior oropharyngeal erythema or tonsillar abscesses.  Eyes: Conjunctivae are normal.  Cardiovascular: Regular rhythm, normal heart sounds and normal pulses.   Pulmonary/Chest:  Effort normal and breath sounds normal. She has no wheezes. She has no rhonchi. She has no rales.  Lymphadenopathy:       Head (right side): No submental, no submandibular, no tonsillar, no preauricular, no posterior auricular and no occipital adenopathy present.       Head (left side): No submental, no submandibular, no tonsillar, no preauricular, no posterior auricular and no occipital adenopathy present.    She has no cervical adenopathy.  Neurological: She is alert.  Skin: Skin is warm and dry.  Psychiatric: She has a normal mood and affect. Her speech is normal and behavior is normal. Thought content normal.  Vitals reviewed.      Assessment & Plan:    1. Sore throat Negative strep. - POCT rapid strep A  2. Sinus congestion Afebrile. Symptoms unchanged. Patient and I discussed starting conservative measures at home, primarily Mucinex, prior to filling antibiotic. Return precautions given.  - cephALEXin (KEFLEX) 500 MG capsule; Take 1 capsule (500 mg total) by mouth 2 (two) times daily.  Dispense: 20 capsule; Refill: 0   I have discontinued Ms. Barlowe's Multiple Vitamins-Minerals (WOMENS MULTIVITAMIN PLUS PO), meloxicam, doxycycline, and promethazine. I am also having her maintain her valACYclovir, montelukast, naproxen, fluticasone, PARoxetine, and QUEtiapine.   No orders of the defined types were placed in this encounter.   Return precautions given.   Risks, benefits, and alternatives of the medications and treatment plan prescribed today were discussed, and patient expressed understanding.   Education regarding symptom management and diagnosis given to patient on AVS.  Continue to follow with Tommi Rumps, MD for routine health maintenance.   [name] and I agreed with plan.   Mable Paris, FNP

## 2016-07-02 NOTE — Patient Instructions (Signed)
I suspect that your infection is viral in nature.  As discussed, I advise that you wait to fill the antibiotic after 1-2 days of symptom management to see if your symptoms improve. If you do not show improvement, you may take the antibiotic as prescribed.  Increase intake of clear fluids. Congestion is best treated by hydration, when mucus is wetter, it is thinner, less sticky, and easier to expel from the body, either through coughing up drainage, or by blowing your nose.   Get plenty of rest.   Use saline nasal drops and blow your nose frequently. Run a humidifier at night and elevate the head of the bed. Vicks Vapor rub will help with congestion and cough. Steam showers and sinus massage for congestion.   Use Acetaminophen or Ibuprofen as needed for fever or pain. Avoid second hand smoke. Even the smallest exposure will worsen symptoms.   Over the counter medications you can try include Delsym for cough, a decongestant for congestion, and Mucinex or Robitussin as an expectorant. Be sure to just get the plain Mucinex or Robitussin that just has one medication (Guaifenesen). We don't recommend the combination products. Note, be sure to drink two glasses of water with each dose of Mucinex as the medication will not work well without adequate hydration.   You can also try a teaspoon of honey to see if this will help reduce cough. Throat lozenges can sometimes be beneficial as well.    This illness will typically last 7 - 10 days.   Please follow up with our clinic if you develop a fever greater than 101 F, symptoms worsen, or do not resolve in the next week.     

## 2016-08-23 ENCOUNTER — Other Ambulatory Visit: Payer: Self-pay | Admitting: Family Medicine

## 2016-09-13 ENCOUNTER — Telehealth: Payer: Self-pay | Admitting: Family Medicine

## 2016-09-13 NOTE — Telephone Encounter (Signed)
Called pt to schedule AWV. Number has been disconnected

## 2016-10-11 NOTE — Telephone Encounter (Signed)
Left pt message asking to call Allison back directly at [phone] to schedule AWV. Thanks! °

## 2016-10-24 ENCOUNTER — Other Ambulatory Visit: Payer: Self-pay | Admitting: Family Medicine

## 2016-11-05 ENCOUNTER — Ambulatory Visit (INDEPENDENT_AMBULATORY_CARE_PROVIDER_SITE_OTHER): Payer: Medicare Other | Admitting: Podiatry

## 2016-11-05 DIAGNOSIS — M778 Other enthesopathies, not elsewhere classified: Secondary | ICD-10-CM

## 2016-11-05 DIAGNOSIS — M19071 Primary osteoarthritis, right ankle and foot: Secondary | ICD-10-CM | POA: Diagnosis not present

## 2016-11-05 DIAGNOSIS — M779 Enthesopathy, unspecified: Secondary | ICD-10-CM

## 2016-11-05 DIAGNOSIS — M7751 Other enthesopathy of right foot: Secondary | ICD-10-CM

## 2016-11-05 DIAGNOSIS — L4050 Arthropathic psoriasis, unspecified: Secondary | ICD-10-CM | POA: Diagnosis not present

## 2016-11-05 MED ORDER — CELECOXIB 200 MG PO CAPS: 200.0000 mg | ORAL_CAPSULE | Freq: Two times a day (BID) | ORAL | 1 refills | Status: DC

## 2016-11-06 NOTE — Progress Notes (Signed)
   HPI: Patient is a 61 year old female presenting today for gradually worsening, burning, prickly pain to the dorsum of the right foot onset 2-3 months ago. Wearing shoes with straps or walking increases the pain. She has not done anything to treat the pain. She is here for further evaluation and treatment.    Physical Exam: General: The patient is alert and oriented x3 in no acute distress.  Dermatology: Skin is warm, dry and supple bilateral lower extremities. Negative for open lesions or macerations.  Vascular: Palpable pedal pulses bilaterally. No edema or erythema noted. Capillary refill within normal limits.  Neurological: Epicritic and protective threshold grossly intact bilaterally.   Musculoskeletal Exam: Range of motion within normal limits to all pedal and ankle joints bilateral. Muscle strength 5/5 in all groups bilateral.      Assessment: 1. H/o psoriatic arthritis  2. DJD/arthritis right midfoot   Plan of Care:  1. Patient was evaluated. 2. Injection of 0.5 mLs Celestone Soluspan injected into the right midfoot. 3. Recommended arch supports. 4. Recommended referral from PCP to rheumatologist.  5. Prescription for Celebrex 200 mg #60 given to patient.  6. Return to clinic when necessary.    Edrick Kins, DPM [facility]  Dr. Edrick Kins, [address]                                        [place], [place] [address]                Office [phone]  Fax [phone]

## 2016-11-09 MED ORDER — BETAMETHASONE SOD PHOS & ACET 6 (3-3) MG/ML IJ SUSP: 3.0000 mg | Freq: Once | INTRAMUSCULAR | Status: DC

## 2016-11-14 ENCOUNTER — Telehealth: Payer: Self-pay | Admitting: Podiatry

## 2016-11-14 MED ORDER — MELOXICAM 15 MG PO TABS: 15.0000 mg | ORAL_TABLET | Freq: Every day | ORAL | 3 refills | Status: DC

## 2016-11-14 NOTE — Telephone Encounter (Signed)
Just discontinue taking. If patient is known to take Mobic 15mg , then just prescribe that. #60 with 1 refill. Thanks, Dr. Amalia Hailey

## 2016-11-14 NOTE — Telephone Encounter (Signed)
Spoke with patient, she states the Celebrex was causing severe headaches, but when she stopped taking it, the headaches went away.  New rx for Mobic 15mg  will be sent to pharmacy per Dr. Amalia Hailey.  Patient is of new medication and if headaches persist with new med, she will call office back

## 2016-11-14 NOTE — Telephone Encounter (Signed)
Dr. Amalia Hailey prescribed Celebrex and I'm having trouble taking these. Every time I take it I get a really bad headache. I just wondered if I should stop taking these or does he want to change my medication.

## 2016-11-25 ENCOUNTER — Telehealth: Payer: Self-pay

## 2016-11-25 NOTE — Telephone Encounter (Signed)
Pharmacy is requesting refill on montelukast 10 mg, it states it was discontinued by error at appmt with Dr.Evans Last filled 11/13/15 30 2rf Last OV 02/26/16

## 2016-11-25 NOTE — Telephone Encounter (Signed)
Please confirm with the patient that she is taking this. If she has I can refill it. Thanks.

## 2016-11-26 MED ORDER — MONTELUKAST SODIUM 10 MG PO TABS: 10.0000 mg | ORAL_TABLET | Freq: Every day | ORAL | 3 refills | Status: DC

## 2016-11-26 NOTE — Telephone Encounter (Signed)
Patient takes this as needed, sent to pharmacy

## 2016-12-03 ENCOUNTER — Encounter: Payer: Self-pay | Admitting: Family Medicine

## 2016-12-03 ENCOUNTER — Ambulatory Visit (INDEPENDENT_AMBULATORY_CARE_PROVIDER_SITE_OTHER): Payer: Medicare Other | Admitting: Family Medicine

## 2016-12-03 DIAGNOSIS — G44211 Episodic tension-type headache, intractable: Secondary | ICD-10-CM | POA: Diagnosis not present

## 2016-12-03 DIAGNOSIS — R51 Headache: Secondary | ICD-10-CM

## 2016-12-03 DIAGNOSIS — R519 Headache, unspecified: Secondary | ICD-10-CM | POA: Insufficient documentation

## 2016-12-03 MED ORDER — KETOROLAC TROMETHAMINE 30 MG/ML IJ SOLN
30.0000 mg | Freq: Once | INTRAMUSCULAR | Status: AC
  Administered 2016-12-03: 30 mg via INTRAMUSCULAR

## 2016-12-03 NOTE — Assessment & Plan Note (Signed)
History of headaches. Headaches over the last 2 weeks that feels similar to prior headaches. Has not been responsive to meloxicam. Has not tried Tylenol. She is neurologically intact with no neurological symptoms. Prior headache that was similar to this was evaluated given her history of a VP shunt. At that time felt to be related to arthritis in her neck. Headache could be tension related. We will give Toradol 30 mg IM for abortive therapy. She can trial Tylenol 1000 mg every 8 hours as needed. We'll have her see her neurologist office tomorrow. She is given return precautions.

## 2016-12-03 NOTE — Patient Instructions (Signed)
Nice to see you. You were given a Toradol injection to help with your headache. I would suggest following up with neurology. We can place a referral back to them as well. If you develop worsening headache or you develop numbness, weakness, balance issues, vision changes, or any new or changing symptoms please be evaluated in the emergency room.

## 2016-12-03 NOTE — Progress Notes (Signed)
  Tommi Rumps, MD Phone: [phone]  Michele Newton is a 61 y.o. female who presents today for same-day visit.  Patient notes over the last 2 weeks she has had headaches. Notes posterior and top of her head headaches. Notes it hurts most of the time. It's a pressure sensation. She does have a history of headaches similar to this. 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 This was found to be unremarkable. No changes on MRI. 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 She had MRI of her neck as well. She notes these headaches are similar to the headache she had then. No sudden onset worst headache of life. No numbness or weakness. No vision changes. No aura. No nausea or vomiting.  PMH: nonsmoker.   ROS see history of present illness  Objective  Physical Exam Vitals:   12/03/16 1458  BP: 140/90  Pulse: 85  Temp: 98.1 F (36.7 C)    BP Readings from Last 3 Encounters:  12/03/16 140/90  07/02/16 126/84  03/11/16 140/82   Wt Readings from Last 3 Encounters:  12/03/16 194 lb (88 kg)  07/02/16 193 lb 12.8 oz (87.9 kg)  03/11/16 192 lb 3.2 oz (87.2 kg)    Physical Exam  Constitutional: No distress.  HENT:  Mouth/Throat: Oropharynx is clear and moist.  Eyes: Pupils are equal, round, and reactive to light. Conjunctivae are normal.  Fundus exam with no apparent papilledema  Cardiovascular: Normal rate, regular rhythm and normal heart sounds.   Pulmonary/Chest: Effort normal and breath sounds normal.  Neurological: She is alert.  CN 2-12 intact, 5/5 strength in bilateral biceps, triceps, grip, quads, hamstrings, plantar and dorsiflexion, sensation to light touch intact in bilateral UE and LE, normal gait, 2+ patellar reflexes  Skin: Skin is warm and dry. She is not diaphoretic.     Assessment/Plan: Please see individual problem  list.  Headache History of headaches. Headaches over the last 2 weeks that feels similar to prior headaches. Has not been responsive to meloxicam. Has not tried Tylenol. She is neurologically intact with no neurological symptoms. Prior headache that was similar to this was evaluated given her history of a VP shunt. At that time felt to be related to arthritis in her neck. Headache could be tension related. We will give Toradol 30 mg IM for abortive therapy. She can trial Tylenol 1000 mg every 8 hours as needed. We'll have her see her neurologist office tomorrow. She is given return precautions.   Orders Placed This Encounter  Procedures  . Ambulatory referral to Neurology    Referral Priority:   Routine    Referral Type:   Consultation    Referral Reason:   Specialty Services Required    Requested Specialty:   Neurology    Number of Visits Requested:   1    Meds ordered this encounter  Medications  . ketorolac (TORADOL) 30 MG/ML injection 30 mg    Tommi Rumps, MD [place]

## 2016-12-04 DIAGNOSIS — R51 Headache: Secondary | ICD-10-CM | POA: Diagnosis not present

## 2016-12-21 ENCOUNTER — Other Ambulatory Visit: Payer: Self-pay | Admitting: Family Medicine

## 2016-12-23 NOTE — Telephone Encounter (Signed)
Refill sent to pharmacy. Patient needs follow-up scheduled for her chronic medical issues. Thanks.

## 2016-12-23 NOTE — Telephone Encounter (Signed)
Last OV 12/03/16 last filled 08/23/16 30 3rf

## 2016-12-23 NOTE — Telephone Encounter (Signed)
Patient will call back and schedule once she looks at her calender

## 2017-02-05 ENCOUNTER — Ambulatory Visit (INDEPENDENT_AMBULATORY_CARE_PROVIDER_SITE_OTHER): Payer: Medicare Other | Admitting: Family Medicine

## 2017-02-05 ENCOUNTER — Encounter: Payer: Self-pay | Admitting: Family Medicine

## 2017-02-05 DIAGNOSIS — R0981 Nasal congestion: Secondary | ICD-10-CM | POA: Diagnosis not present

## 2017-02-05 MED ORDER — CEPHALEXIN 500 MG PO CAPS: 500.0000 mg | ORAL_CAPSULE | Freq: Two times a day (BID) | ORAL | 0 refills | Status: DC

## 2017-02-05 NOTE — Progress Notes (Signed)
SUBJECTIVE:  Michele Newton is a 61 y.o. female who complains of coryza, congestion, productive cough and bilateral sinus pain for 14 days. She denies a history of anorexia and chest pain and denies a history of asthma. Patient denies smoke cigarettes.   Current Outpatient Prescriptions on File Prior to Visit  Medication Sig Dispense Refill  . fluticasone (FLONASE) 50 MCG/ACT nasal spray Place 2 sprays into both nostrils daily. 16 g 6  . montelukast (SINGULAIR) 10 MG tablet Take 1 tablet (10 mg total) by mouth at bedtime. 30 tablet 3  . naproxen (NAPROSYN) 500 MG tablet Take 1 tablet (500 mg total) by mouth 2 (two) times daily with a meal. 30 tablet 0  . PARoxetine (PAXIL) 20 MG tablet take 1 tablet by mouth once daily 30 tablet 3  . QUEtiapine (SEROQUEL) 200 MG tablet take 1 tablet by mouth at bedtime 30 tablet 1   Current Facility-Administered Medications on File Prior to Visit  Medication Dose Route Frequency Provider Last Rate Last Dose  . betamethasone acetate-betamethasone sodium phosphate (CELESTONE) injection 3 mg  3 mg Intramuscular Once Edrick Kins, DPM        Allergies  Allergen Reactions  . Amitriptyline Other (See Comments)    hallucinations  . Effexor [Venlafaxine] Other (See Comments)    Amnesia, hospitalized x 2 weeks  . Haldol [Haloperidol]     Paralyzed vocal cords   . Vicodin [Hydrocodone-Acetaminophen] Itching    Past Medical History:  Diagnosis Date  . Arthritis   . Depression   . Frequent headaches   . Migraine   . Psoriatic arthritis [facility])     Past Surgical History:  Procedure Laterality Date  . BREAST SURGERY  2000   biopsy  . NASAL SINUS SURGERY  1998  . TONSILLECTOMY AND ADENOIDECTOMY  1985  . TYMPANOSTOMY TUBE PLACEMENT  1998  . VENTRICULAR ATRIAL SHUNT Right 1995    Family History  Problem Relation Age of Onset  . Arthritis Mother   . Hyperlipidemia Mother   . Heart disease Mother 50       Died of heart failure  . Hypertension Mother    . Diabetes Mother   . Arthritis Father   . Cancer Father        prostate cancer  . Diabetes Brother     Social History   Social History  . Marital status: Married    Spouse name: [name]  . Number of children: 2  . Years of education: 14   Occupational History  .  Other   Social History Main Topics  . Smoking status: Never Smoker  . Smokeless tobacco: Never Used  . Alcohol use No  . Drug use: No  . Sexual activity: Yes   Other Topics Concern  . Not on file   Social History Narrative   Davi lives at home with her husband, [name], of 5 months and a cat named, Puff. She has 2 sons from a previous marriage. She has 4 grandchildren.    The PMH, PSH, Social History, Family History, Medications, and allergies have been reviewed in [facility], and have been updated if relevant.  OBJECTIVE:  BP 138/78 (BP Location: Left Arm, Patient Position: Sitting, Cuff Size: Normal)   Pulse 72   Temp 98.1 F (36.7 C) (Oral)   Resp 16   Wt 195 lb 4 oz (88.6 kg)   SpO2 95%   BMI 31.51 kg/m   She appears well, vital signs are as noted. Ears normal.  Throat and pharynx normal.  Neck supple. No adenopathy in the neck. Nose is congested. Sinuses  tender. The chest is clear, without wheezes or rales.  ASSESSMENT:  sinusitis  PLAN: Given duration and progression of symptoms, will treat for bacterial sinusitis.  Symptomatic therapy suggested: push fluids, rest and return office visit prn if symptoms persist or worsen.Call or return to clinic prn if these symptoms worsen or fail to improve as anticipated.

## 2017-02-12 ENCOUNTER — Telehealth: Payer: Self-pay

## 2017-02-12 MED ORDER — DOXYCYCLINE HYCLATE 100 MG PO TABS: 100.0000 mg | ORAL_TABLET | Freq: Two times a day (BID) | ORAL | 0 refills | Status: AC

## 2017-02-12 NOTE — Telephone Encounter (Signed)
Pt left v/m; pt was seen 02/05/17 for sinus infection; pt has been taking keflex and pt is not any better; keflex is irritating pt stomach with pain, diarrhea and nausea. Pt wants to know if can get different abx. Pt request cb. [facility] [address] [place].Please advise.

## 2017-02-12 NOTE — Telephone Encounter (Signed)
Unfortunately all abx have that potential of causing GI upset.  She is not my patient, we started keflex since she had tolerated this in the past.  Zpack could interact with her other medications.  We could certainly try doxycycline which she has also had before.  [place] sent for doxycycline to pharmacy on file.Marland Kitchen

## 2017-02-14 ENCOUNTER — Other Ambulatory Visit: Payer: Self-pay | Admitting: Family Medicine

## 2017-02-14 NOTE — Telephone Encounter (Signed)
Refill given. Patient needs medication monitoring lab work given that she is on the Seroquel. Thanks.

## 2017-02-14 NOTE — Telephone Encounter (Signed)
Patient aware of Michele Newton/C of Keflex and Start Doxy/updated meds/thx dmf

## 2017-02-14 NOTE — Telephone Encounter (Signed)
Last OV 12/03/16 last filled 12/23/16 30 1rf

## 2017-02-17 ENCOUNTER — Other Ambulatory Visit: Payer: Self-pay | Admitting: Family Medicine

## 2017-02-17 NOTE — Telephone Encounter (Signed)
Patient notified and is scheduled for lab, please place order

## 2017-02-18 ENCOUNTER — Telehealth: Payer: Self-pay | Admitting: Radiology

## 2017-02-18 DIAGNOSIS — Z79899 Other long term (current) drug therapy: Secondary | ICD-10-CM

## 2017-02-18 DIAGNOSIS — Z5181 Encounter for therapeutic drug level monitoring: Secondary | ICD-10-CM

## 2017-02-18 NOTE — Telephone Encounter (Addendum)
Patient needs in office follow up scheduled as well. Patients labs need to be fasting. Please see if she can be fasting at her scheduled time of 2 pm. If not reschedule her. Thanks.

## 2017-02-18 NOTE — Telephone Encounter (Signed)
Orders placed.

## 2017-02-18 NOTE — Addendum Note (Signed)
Addended by: Leone Haven on: 02/18/2017 06:58 PM   Modules accepted: Orders

## 2017-02-18 NOTE — Telephone Encounter (Signed)
Pt coming in for labs tomorrow, please place future orders. Thank you.  

## 2017-02-19 ENCOUNTER — Other Ambulatory Visit: Payer: Medicare Other

## 2017-02-19 NOTE — Telephone Encounter (Signed)
Patient scheduled for lab 

## 2017-02-20 ENCOUNTER — Other Ambulatory Visit (INDEPENDENT_AMBULATORY_CARE_PROVIDER_SITE_OTHER): Payer: Medicare Other

## 2017-02-20 DIAGNOSIS — Z79899 Other long term (current) drug therapy: Secondary | ICD-10-CM | POA: Diagnosis not present

## 2017-02-20 DIAGNOSIS — Z5181 Encounter for therapeutic drug level monitoring: Secondary | ICD-10-CM | POA: Diagnosis not present

## 2017-02-20 LAB — COMPREHENSIVE METABOLIC PANEL
ALT: 39 U/L — ABNORMAL HIGH (ref 0–35)
AST: 23 U/L (ref 0–37)
Albumin: 4.2 g/dL (ref 3.5–5.2)
Alkaline Phosphatase: 88 U/L (ref 39–117)
BUN: 16 mg/dL (ref 6–23)
CALCIUM: 9.5 mg/dL (ref 8.4–10.5)
CHLORIDE: 106 meq/L (ref 96–112)
CO2: 27 meq/L (ref 19–32)
CREATININE: 0.81 mg/dL (ref 0.40–1.20)
GFR: 76.18 mL/min (ref >60.00)
Glucose, Bld: 106 mg/dL — ABNORMAL HIGH (ref 70–99)
POTASSIUM: 4.4 meq/L (ref 3.5–5.1)
SODIUM: 141 meq/L (ref 135–145)
Total Bilirubin: 0.4 mg/dL (ref 0.2–1.2)
Total Protein: 6.6 g/dL (ref 6.0–8.3)

## 2017-02-20 LAB — LIPID PANEL
CHOL/HDL RATIO: 8
Cholesterol: 289 mg/dL — ABNORMAL HIGH (ref 0–200)
HDL: 38.4 mg/dL — AB (ref >39.00)
LDL CALC: 218 mg/dL — AB (ref 0–99)
NONHDL: 250.69
Triglycerides: 165 mg/dL — ABNORMAL HIGH (ref 0.0–149.0)
VLDL: 33 mg/dL (ref 0.0–40.0)

## 2017-02-20 LAB — CBC
HEMATOCRIT: 45.2 % (ref 36.0–46.0)
Hemoglobin: 15.1 g/dL — ABNORMAL HIGH (ref 12.0–15.0)
MCHC: 33.5 g/dL (ref 30.0–36.0)
MCV: 86.4 fl (ref 78.0–100.0)
Platelets: 409 10*3/uL — ABNORMAL HIGH (ref 150.0–400.0)
RBC: 5.23 Mil/uL — ABNORMAL HIGH (ref 3.87–5.11)
RDW: 13.5 % (ref 11.5–15.5)
WBC: 6 10*3/uL (ref 4.0–10.5)

## 2017-02-20 LAB — TSH: TSH: 1.07 u[IU]/mL (ref 0.35–4.50)

## 2017-02-20 LAB — T4, FREE: FREE T4: 0.76 ng/dL (ref 0.60–1.60)

## 2017-02-20 LAB — HEMOGLOBIN A1C: Hgb A1c MFr Bld: 5.8 % (ref 4.6–6.5)

## 2017-02-26 ENCOUNTER — Other Ambulatory Visit: Payer: Self-pay | Admitting: Family Medicine

## 2017-02-26 ENCOUNTER — Telehealth: Payer: Self-pay | Admitting: Family Medicine

## 2017-02-26 DIAGNOSIS — L4050 Arthropathic psoriasis, unspecified: Secondary | ICD-10-CM

## 2017-02-26 DIAGNOSIS — D75839 Thrombocytosis, unspecified: Secondary | ICD-10-CM

## 2017-02-26 DIAGNOSIS — D473 Essential (hemorrhagic) thrombocythemia: Secondary | ICD-10-CM

## 2017-02-26 MED ORDER — QUETIAPINE FUMARATE 50 MG PO TABS: ORAL_TABLET | ORAL | 0 refills | Status: DC

## 2017-02-26 NOTE — Telephone Encounter (Signed)
Please advise 

## 2017-02-26 NOTE — Telephone Encounter (Signed)
Pt called and is requesting a referral to Rheumatology. Pt was seeing Dr. Jefm Bryant but it has been several years and they stated that she needs a new referral. Pt would now like to see Dr. Meda Coffee. Please advise, thank you!

## 2017-02-28 ENCOUNTER — Encounter: Payer: Self-pay | Admitting: Family Medicine

## 2017-03-01 NOTE — Telephone Encounter (Signed)
Referral placed.

## 2017-03-01 NOTE — Addendum Note (Signed)
Addended by: Leone Haven on: 03/01/2017 04:05 PM   Modules accepted: Orders

## 2017-03-05 ENCOUNTER — Other Ambulatory Visit: Payer: Self-pay | Admitting: Family Medicine

## 2017-03-06 ENCOUNTER — Encounter: Payer: Self-pay | Admitting: Oncology

## 2017-03-06 ENCOUNTER — Inpatient Hospital Stay: Payer: Medicare Other | Attending: Oncology | Admitting: Oncology

## 2017-03-06 ENCOUNTER — Inpatient Hospital Stay: Payer: Medicare Other

## 2017-03-06 VITALS — BP 154/90 | HR 80 | Temp 98.4°F | Wt 194.4 lb

## 2017-03-06 DIAGNOSIS — M1990 Unspecified osteoarthritis, unspecified site: Secondary | ICD-10-CM | POA: Diagnosis not present

## 2017-03-06 DIAGNOSIS — Z79899 Other long term (current) drug therapy: Secondary | ICD-10-CM | POA: Diagnosis not present

## 2017-03-06 DIAGNOSIS — Z8042 Family history of malignant neoplasm of prostate: Secondary | ICD-10-CM

## 2017-03-06 DIAGNOSIS — D473 Essential (hemorrhagic) thrombocythemia: Secondary | ICD-10-CM | POA: Diagnosis not present

## 2017-03-06 DIAGNOSIS — F418 Other specified anxiety disorders: Secondary | ICD-10-CM | POA: Diagnosis not present

## 2017-03-06 DIAGNOSIS — D75839 Thrombocytosis, unspecified: Secondary | ICD-10-CM

## 2017-03-06 DIAGNOSIS — D509 Iron deficiency anemia, unspecified: Secondary | ICD-10-CM | POA: Insufficient documentation

## 2017-03-06 DIAGNOSIS — L4050 Arthropathic psoriasis, unspecified: Secondary | ICD-10-CM

## 2017-03-06 LAB — CBC WITH DIFFERENTIAL/PLATELET
BASOS PCT: 0 %
Basophils Absolute: 0 10*3/uL (ref 0–0.1)
Eosinophils Absolute: 0.1 10*3/uL (ref 0–0.7)
Eosinophils Relative: 2 %
HEMATOCRIT: 43.1 % (ref 35.0–47.0)
HEMOGLOBIN: 14.7 g/dL (ref 12.0–16.0)
LYMPHS ABS: 2.2 10*3/uL (ref 1.0–3.6)
LYMPHS PCT: 30 %
MCH: 28.6 pg (ref 26.0–34.0)
MCHC: 34.1 g/dL (ref 32.0–36.0)
MCV: 83.8 fL (ref 80.0–100.0)
MONO ABS: 0.7 10*3/uL (ref 0.2–0.9)
MONOS PCT: 10 %
NEUTROS ABS: 4.2 10*3/uL (ref 1.4–6.5)
NEUTROS PCT: 58 %
Platelets: 400 10*3/uL (ref 150–440)
RBC: 5.15 MIL/uL (ref 3.80–5.20)
RDW: 13.5 % (ref 11.5–14.5)
WBC: 7.3 10*3/uL (ref 3.6–11.0)

## 2017-03-06 LAB — LACTATE DEHYDROGENASE: LDH: 178 U/L (ref 98–192)

## 2017-03-06 LAB — COMPREHENSIVE METABOLIC PANEL
ALBUMIN: 4.1 g/dL (ref 3.5–5.0)
ALK PHOS: 95 U/L (ref 38–126)
ALT: 47 U/L (ref 14–54)
ANION GAP: 9 (ref 5–15)
AST: 32 U/L (ref 15–41)
BILIRUBIN TOTAL: 0.5 mg/dL (ref 0.3–1.2)
BUN: 18 mg/dL (ref 6–20)
CALCIUM: 8.7 mg/dL — AB (ref 8.9–10.3)
CO2: 24 mmol/L (ref 22–32)
Chloride: 104 mmol/L (ref 101–111)
Creatinine, Ser: 0.81 mg/dL (ref 0.44–1.00)
GFR calc Af Amer: >60 mL/min (ref >60)
GLUCOSE: 103 mg/dL — AB (ref 65–99)
Potassium: 4 mmol/L (ref 3.5–5.1)
Sodium: 137 mmol/L (ref 135–145)
TOTAL PROTEIN: 7.3 g/dL (ref 6.5–8.1)

## 2017-03-06 LAB — IRON AND TIBC
IRON: 76 ug/dL (ref 28–170)
SATURATION RATIOS: 22 % (ref 10.4–31.8)
TIBC: 353 ug/dL (ref 250–450)
UIBC: 277 ug/dL

## 2017-03-06 LAB — FERRITIN: Ferritin: 57 ng/mL (ref 11–307)

## 2017-03-06 NOTE — Progress Notes (Signed)
Hematology/Oncology Consult note [facility] Telephone:([phone] [phone]   Patient Care Team: Leone Haven, MD as PCP - General (Family Medicine)  REFERRING PROVIDER: Leone Haven, MD CHIEF COMPLAINTS/PURPOSE OF CONSULTATION:  Evaluation of thrombocytosis  HISTORY OF PRESENTING ILLNESS:  Michele Newton is a  61 y.o.  female with PMH listed below who was referred to me for evaluation of thrombocytosis. Patient had routine labs done on 02/20/2017 which shows WBC of 6, hemoglobin 15.1, platelet 409,000. Reviewing her previous CBC showed that her platelet has been chronically elevated with the 400,000 range. Her hemoglobin was elevated at 15.2 in 2016, slightly lower in 2017 at 14.2.  Patient denies any itchiness, history of clots, blood in the stool, fever or chills, weight loss, night sweats. She feels well. She has chronic multiple joint arthritis and feels stiff after sitting for long time.she denies much of morning stiffness. She takes naproxen as needed. She has chronic depression/anxiety. Denies smoking or drinking alcohol. Denies any family history of cancer.  REVIEW OF SYSTEM Constitutional: Negative for fever, night sweats,change in appetite. unintentional weight loss,  HENT: Negative for ear pain, hearing loss, nasal bleeding Eyes: Negative for eye pain, double vision   Respiratory: Negative for wheezing, shortness of breath, cough Cardiovascular: Negative for chest pain, palpitation.   Gastrointestinal: Negative abdominal pain, diarrhea, nausea vomiting Endocrine: Negative  Genitourinary: Negative for dysuria, hematuria, frequency Skin: Negative for rash, iching, bruising Neurological: Negative for headache, dizziness, seizure Hematological: Negative for easy bruising/bleeding, lymph node enlargement Psychiatric/Behavioral: Negative for depression, anxiety, suicidality  MEDICAL HISTORY:  Past Medical History:  Diagnosis Date  .  Anxiety   . Arthritis   . Depression   . Frequent headaches   . Migraine   . Psoriatic arthritis ([place])     SURGICAL HISTORY: Past Surgical History:  Procedure Laterality Date  . BREAST SURGERY  2000   biopsy  . NASAL SINUS SURGERY  1998  . TONSILLECTOMY AND ADENOIDECTOMY  1985  . TUBAL LIGATION    . TYMPANOSTOMY TUBE PLACEMENT  1998  . VENTRICULAR ATRIAL SHUNT Right 1995    SOCIAL HISTORY: Social History   Social History  . Marital status: Married    Spouse name: [name]  . Number of children: 2  . Years of education: 14   Occupational History  .  Other   Social History Main Topics  . Smoking status: Never Smoker  . Smokeless tobacco: Never Used  . Alcohol use No  . Drug use: No  . Sexual activity: No   Other Topics Concern  . Not on file   Social History Narrative   Mikeria lives at home with her husband, [name], of 5 months and a cat named, Puff. She has 2 sons from a previous marriage. She has 4 grandchildren.     FAMILY HISTORY: Family History  Problem Relation Age of Onset  . Arthritis Mother   . Hyperlipidemia Mother   . Heart disease Mother 5       Died of heart failure  . Hypertension Mother   . Diabetes Mother   . Arthritis Father   . Cancer Father        prostate cancer  . Diabetes Brother     ALLERGIES:  is allergic to amitriptyline; effexor [venlafaxine]; haldol [haloperidol]; and vicodin [hydrocodone-acetaminophen].  MEDICATIONS:  Current Outpatient Prescriptions  Medication Sig Dispense Refill  . fluticasone (FLONASE) 50 MCG/ACT nasal spray Place 2 sprays into both nostrils daily. 16 g 6  .  naproxen (NAPROSYN) 500 MG tablet Take 1 tablet (500 mg total) by mouth 2 (two) times daily with a meal. 30 tablet 0  . PARoxetine (PAXIL) 20 MG tablet take 1 tablet by mouth once daily 30 tablet 3  . QUEtiapine (SEROQUEL) 50 MG tablet Take 3 tablets (150 mg) by mouth daily for 2 weeks, then take 2 tablets (100 mg) by mouth daily for 2 weeks, then take  one tablet (50 mg) by mouth daily for 2 weeks, then discontinue 84 tablet 0   Current Facility-Administered Medications  Medication Dose Route Frequency Provider Last Rate Last Dose  . betamethasone acetate-betamethasone sodium phosphate (CELESTONE) injection 3 mg  3 mg Intramuscular Once Edrick Kins, DPM          .  PHYSICAL EXAMINATION: ECOG PERFORMANCE STATUS: 0 - Asymptomatic Vitals:   03/06/17 0857  BP: (!) 154/90  Pulse: 80  Temp: 98.4 F (36.9 C)   Filed Weights   03/06/17 0857  Weight: 194 lb 7.1 oz (88.2 kg)   GENERAL: No distress, well nourished.  SKIN:  No rashes or significant lesions  HEAD: Normocephalic, No masses, lesions, tenderness or abnormalities  EYES: Conjunctiva are pink, non icteric ENT: External ears normal ,lips , buccal mucosa, and tongue normal and mucous membranes are moist  LYMPH: No palpable cervical and axillary lymphadenopathy  NECK: (+) ventricular atrial shunt at right side of neck.  LUNGS: Clear to auscultation, no crackles or wheezes HEART: Regular rate & rhythm, no murmurs, no gallops, S1 normal and S2 normal  ABDOMEN: Abdomen soft, non-tender, normal bowel sounds, I did not appreciate any  masses or organomegaly  MUSCULOSKELETAL: No CVA tenderness and no tenderness on percussion of the back or rib cage.  EXTREMITIES: No edema, no skin discoloration or tenderness NEURO: Alert & oriented, no focal motor/sensory deficits.   LABORATORY DATA:  I have reviewed the data as listed Lab Results  Component Value Date   WBC 6.0 02/20/2017   HGB 15.1 (H) 02/20/2017   HCT 45.2 02/20/2017   MCV 86.4 02/20/2017   PLT 409.0 (H) 02/20/2017    Recent Labs  03/11/16 1854 02/20/17 0841  NA 139 141  K 4.0 4.4  CL 105 106  CO2 26 27  GLUCOSE 120* 106*  BUN 15 16  CREATININE 0.91 0.81  CALCIUM 9.3 9.5  PROT 7.4 6.6  ALBUMIN 4.1 4.2  AST 32 23  ALT 38* 39*  ALKPHOS 113 88  BILITOT 0.5 0.4     ASSESSMENT & PLAN:  1. Thrombocytosis  ([place])    I discussed with patient that the differential diagnosis of the thrombosis is broad, including benign etiology such as reactive to surgery, trauma, infection, nutrition deficiency, etc, as well as malignant etiology including underlying bone marrow disorders.   For the work up of patient's thrombocytosis, I recommend checking CBC;CMP, LDH, pathology smear review, JAK 2 mutation,  BCR ABL; MPL; CALR mutation.  Also discussed possible bone marrow biopsy if above workup is inconclusive; however I would prefer not to do a bone marrow unless absolutely needed.    All questions were answered. The patient knows to call the clinic with any problems questions or concerns.  Return of visit: 2 weeks to discuss results.  Thank you for this kind referral and the opportunity to participate in the care of this patient. A copy of today's note is routed to referring provider    Earlie Server, MD, PhD Hematology Oncology [place] at [facility] Pager-  [phone] 03/06/2017

## 2017-03-06 NOTE — Progress Notes (Signed)
Patient here today as a new patient  

## 2017-03-07 LAB — PATHOLOGIST SMEAR REVIEW

## 2017-03-15 LAB — BCR-ABL1 FISH
CELLS ANALYZED: 200
CELLS COUNTED: 200
PDF LCA: 0

## 2017-03-20 ENCOUNTER — Other Ambulatory Visit: Payer: Self-pay

## 2017-03-20 ENCOUNTER — Inpatient Hospital Stay (HOSPITAL_BASED_OUTPATIENT_CLINIC_OR_DEPARTMENT_OTHER): Payer: Medicare Other | Admitting: Oncology

## 2017-03-20 ENCOUNTER — Encounter: Payer: Self-pay | Admitting: Oncology

## 2017-03-20 VITALS — BP 145/87 | HR 78 | Temp 97.5°F | Wt 195.0 lb

## 2017-03-20 DIAGNOSIS — D473 Essential (hemorrhagic) thrombocythemia: Secondary | ICD-10-CM

## 2017-03-20 DIAGNOSIS — Z79899 Other long term (current) drug therapy: Secondary | ICD-10-CM | POA: Diagnosis not present

## 2017-03-20 DIAGNOSIS — F418 Other specified anxiety disorders: Secondary | ICD-10-CM

## 2017-03-20 DIAGNOSIS — D509 Iron deficiency anemia, unspecified: Secondary | ICD-10-CM | POA: Diagnosis not present

## 2017-03-20 DIAGNOSIS — Z8042 Family history of malignant neoplasm of prostate: Secondary | ICD-10-CM

## 2017-03-20 DIAGNOSIS — L4050 Arthropathic psoriasis, unspecified: Secondary | ICD-10-CM

## 2017-03-20 DIAGNOSIS — D75839 Thrombocytosis, unspecified: Secondary | ICD-10-CM

## 2017-03-20 DIAGNOSIS — M1990 Unspecified osteoarthritis, unspecified site: Secondary | ICD-10-CM | POA: Diagnosis not present

## 2017-03-20 LAB — CALR + JAK2 E12-15 + MPL (REFLEXED)

## 2017-03-20 LAB — JAK2 V617F, W REFLEX TO CALR/E12/MPL

## 2017-03-20 MED ORDER — FERROUS SULFATE 325 (65 FE) MG PO TBEC: 325.0000 mg | DELAYED_RELEASE_TABLET | Freq: Two times a day (BID) | ORAL | 3 refills | Status: DC

## 2017-03-20 NOTE — Progress Notes (Signed)
Patient here today for follow up.  Patient states no new concerns today  

## 2017-03-20 NOTE — Progress Notes (Signed)
Hematology/Oncology Consult note [facility] [phone] [phone]   Patient Care Team: Leone Haven, MD as PCP - General (Family Medicine)  REFERRING PROVIDER: Leone Haven, MD CHIEF COMPLAINTS/PURPOSE OF CONSULTATION:  Evaluation of thrombocytosis  HISTORY OF PRESENTING ILLNESS:  Michele Newton is a  61 y.o.  female with PMH listed below who was referred to me for evaluation of thrombocytosis. Patient had routine labs done on 02/20/2017 which shows WBC of 6, hemoglobin 15.1, platelet 409,000. Reviewing her previous CBC showed that her platelet has been chronically elevated with the 400,000 range. Her hemoglobin was elevated at 15.2 in 2016, slightly lower in 2017 at 14.2.  Patient denies any itchiness, history of clots, blood in the stool, fever or chills, weight loss, night sweats. She feels well. She has chronic multiple joint arthritis and feels stiff after sitting for long time.she denies much of morning stiffness. She takes naproxen as needed. She has chronic depression/anxiety. Denies smoking or drinking alcohol. Denies any family history of cancer.  INTERVAL HISTORY Patient comes for follow up of her thrombocytosis.  She does not have any new complaints.   REVIEW OF SYSTEM Constitutional: Negative for fever, night sweats,change in appetite. unintentional weight loss,  HENT: Negative for ear pain, hearing loss, nasal bleeding Eyes: Negative for eye pain, double vision   Respiratory: Negative for wheezing, shortness of breath, cough Cardiovascular: Negative for chest pain, palpitation.   Gastrointestinal: Negative abdominal pain, diarrhea, nausea vomiting Endocrine: Negative  Genitourinary: Negative for dysuria, hematuria, frequency Skin: Negative for rash, iching, bruising Neurological: Negative for headache, dizziness, seizure Hematological: Negative for easy bruising/bleeding, lymph node  enlargement Psychiatric/Behavioral: Negative for depression, anxiety, suicidality MEDICAL HISTORY:  Past Medical History:  Diagnosis Date  . Anxiety   . Arthritis   . Depression   . Frequent headaches   . Migraine   . Psoriatic arthritis ([place])     SURGICAL HISTORY: Past Surgical History:  Procedure Laterality Date  . BREAST SURGERY  2000   biopsy  . NASAL SINUS SURGERY  1998  . TONSILLECTOMY AND ADENOIDECTOMY  1985  . TUBAL LIGATION    . TYMPANOSTOMY TUBE PLACEMENT  1998  . VENTRICULAR ATRIAL SHUNT Right 1995    SOCIAL HISTORY: Social History   Socioeconomic History  . Marital status: Married    Spouse name: [name]  . Number of children: 2  . Years of education: 38  . Highest education level: Not on file  Social Needs  . Financial resource strain: Not on file  . Food insecurity - worry: Not on file  . Food insecurity - inability: Not on file  . Transportation needs - medical: Not on file  . Transportation needs - non-medical: Not on file  Occupational History    Employer: OTHER  Tobacco Use  . Smoking status: Never Smoker  . Smokeless tobacco: Never Used  Substance and Sexual Activity  . Alcohol use: No  . Drug use: No  . Sexual activity: No    Birth control/protection: Surgical  Other Topics Concern  . Not on file  Social History Narrative   [name] lives at home with her husband, [name], of 5 months and a cat named, Puff. She has 2 sons from a previous marriage. She has 4 grandchildren.     FAMILY HISTORY: Family History  Problem Relation Age of Onset  . Arthritis Mother   . Hyperlipidemia Mother   . Heart disease Mother 63       Died of heart  failure  . Hypertension Mother   . Diabetes Mother   . Arthritis Father   . Cancer Father        prostate cancer  . Diabetes Brother     ALLERGIES:  is allergic to amitriptyline; effexor [venlafaxine]; haldol [haloperidol]; and vicodin [hydrocodone-acetaminophen].  MEDICATIONS:  Current Outpatient  Medications  Medication Sig Dispense Refill  . fluticasone (FLONASE) 50 MCG/ACT nasal spray Place 2 sprays into both nostrils daily. 16 g 6  . naproxen (NAPROSYN) 500 MG tablet Take 1 tablet (500 mg total) by mouth 2 (two) times daily with a meal. 30 tablet 0  . PARoxetine (PAXIL) 20 MG tablet take 1 tablet by mouth once daily 30 tablet 1  . QUEtiapine (SEROQUEL) 50 MG tablet Take 3 tablets (150 mg) by mouth daily for 2 weeks, then take 2 tablets (100 mg) by mouth daily for 2 weeks, then take one tablet (50 mg) by mouth daily for 2 weeks, then discontinue 84 tablet 0   Current Facility-Administered Medications  Medication Dose Route Frequency Provider Last Rate Last Dose  . betamethasone acetate-betamethasone sodium phosphate (CELESTONE) injection 3 mg  3 mg Intramuscular Once Edrick Kins, DPM          .  PHYSICAL EXAMINATION: ECOG PERFORMANCE STATUS: 0 - Asymptomatic Vitals:   03/20/17 0957  BP: (!) 145/87  Pulse: 78  Temp: (!) 97.5 F (36.4 C)   Filed Weights   03/20/17 0957  Weight: 194 lb 16 oz (88.5 kg)   GENERAL: No distress, well nourished.  SKIN:  No rashes or significant lesions  HEAD: Normocephalic, No masses, lesions, tenderness or abnormalities  EYES: Conjunctiva are pink, non icteric ENT: External ears normal ,lips , buccal mucosa, and tongue normal and mucous membranes are moist  LYMPH: No palpable cervical and axillary lymphadenopathy  NECK:  (+) ventricular atrial shunt at right side of neck.  LUNGS: Clear to auscultation, no crackles or wheezes HEART: Regular rate & rhythm, no murmurs, no gallops, S1 normal and S2 normal  ABDOMEN: Abdomen soft, non-tender, normal bowel sounds, I did not appreciate any  masses or organomegaly  MUSCULOSKELETAL: No CVA tenderness and no tenderness on percussion of the back or rib cage.  EXTREMITIES: No edema, no skin discoloration or tenderness NEURO: Alert & oriented, no focal motor/sensory deficits.  LABORATORY DATA:  I  have reviewed the data as listed Lab Results  Component Value Date   WBC 7.3 03/06/2017   HGB 14.7 03/06/2017   HCT 43.1 03/06/2017   MCV 83.8 03/06/2017   PLT 400 03/06/2017   Recent Labs    02/20/17 0841 03/06/17 0918  NA 141 137  K 4.4 4.0  CL 106 104  CO2 27 24  GLUCOSE 106* 103*  BUN 16 18  CREATININE 0.81 0.81  CALCIUM 9.5 8.7*  GFRNONAA  --  >60  GFRAA  --  >60  PROT 6.6 7.3  ALBUMIN 4.2 4.1  AST 23 32  ALT 39* 47  ALKPHOS 88 95  BILITOT 0.4 0.5     ASSESSMENT & PLAN:  1. Thrombocytosis ([place])   2. Iron deficiency anemia, unspecified iron deficiency anemia type    #Repeat CBC recently showed normal but upper normal limit platelet counts. JAK 2 mutation with reflex pending. Negative BCR ABL Hold additional work up for now and continue monitor.  # Sent rx for ferrous sulfate 325mg  BID.  # check UA to see if any blood loss in urine  Stool occult.   All  questions were answered. The patient knows to call the clinic with any problems questions or concerns.  Return of visit: 6 weeks with labs 1-2 days prior to visit. .  Thank you for this kind referral and the opportunity to participate in the care of this patient. A copy of today's note is routed to referring provider    Earlie Server, MD, PhD Hematology Oncology [facility] Pager- [phone] 03/20/2017

## 2017-03-31 ENCOUNTER — Encounter: Payer: Self-pay | Admitting: *Deleted

## 2017-03-31 ENCOUNTER — Ambulatory Visit: Payer: Medicare Other | Admitting: Gastroenterology

## 2017-04-08 DIAGNOSIS — M545 Low back pain, unspecified: Secondary | ICD-10-CM | POA: Insufficient documentation

## 2017-04-08 DIAGNOSIS — M159 Polyosteoarthritis, unspecified: Secondary | ICD-10-CM | POA: Diagnosis not present

## 2017-04-08 DIAGNOSIS — M5136 Other intervertebral disc degeneration, lumbar region: Secondary | ICD-10-CM | POA: Diagnosis not present

## 2017-04-08 DIAGNOSIS — L409 Psoriasis, unspecified: Secondary | ICD-10-CM | POA: Diagnosis not present

## 2017-04-08 DIAGNOSIS — G8929 Other chronic pain: Secondary | ICD-10-CM | POA: Insufficient documentation

## 2017-04-08 DIAGNOSIS — M1990 Unspecified osteoarthritis, unspecified site: Secondary | ICD-10-CM | POA: Insufficient documentation

## 2017-04-25 ENCOUNTER — Inpatient Hospital Stay: Payer: Medicare Other

## 2017-05-01 ENCOUNTER — Ambulatory Visit: Payer: Medicare Other | Admitting: Oncology

## 2017-05-02 ENCOUNTER — Other Ambulatory Visit: Payer: Self-pay | Admitting: Family Medicine

## 2017-05-15 ENCOUNTER — Other Ambulatory Visit: Payer: Medicare Other

## 2017-05-15 ENCOUNTER — Ambulatory Visit: Payer: Medicare Other | Admitting: Oncology

## 2017-05-20 DIAGNOSIS — G8929 Other chronic pain: Secondary | ICD-10-CM | POA: Diagnosis not present

## 2017-05-20 DIAGNOSIS — M159 Polyosteoarthritis, unspecified: Secondary | ICD-10-CM | POA: Diagnosis not present

## 2017-05-20 DIAGNOSIS — M25512 Pain in left shoulder: Secondary | ICD-10-CM | POA: Diagnosis not present

## 2017-05-20 DIAGNOSIS — M7552 Bursitis of left shoulder: Secondary | ICD-10-CM | POA: Insufficient documentation

## 2017-05-20 DIAGNOSIS — L409 Psoriasis, unspecified: Secondary | ICD-10-CM | POA: Diagnosis not present

## 2017-05-20 DIAGNOSIS — L4050 Arthropathic psoriasis, unspecified: Secondary | ICD-10-CM | POA: Diagnosis not present

## 2017-05-22 ENCOUNTER — Ambulatory Visit (INDEPENDENT_AMBULATORY_CARE_PROVIDER_SITE_OTHER): Payer: Medicare Other | Admitting: Primary Care

## 2017-05-22 ENCOUNTER — Encounter: Payer: Self-pay | Admitting: Primary Care

## 2017-05-22 VITALS — BP 124/74 | HR 65 | Temp 98.0°F | Wt 191.0 lb

## 2017-05-22 DIAGNOSIS — F329 Major depressive disorder, single episode, unspecified: Secondary | ICD-10-CM | POA: Diagnosis not present

## 2017-05-22 DIAGNOSIS — F419 Anxiety disorder, unspecified: Secondary | ICD-10-CM | POA: Diagnosis not present

## 2017-05-22 DIAGNOSIS — F32A Depression, unspecified: Secondary | ICD-10-CM

## 2017-05-22 DIAGNOSIS — J0191 Acute recurrent sinusitis, unspecified: Secondary | ICD-10-CM

## 2017-05-22 MED ORDER — PAROXETINE HCL 30 MG PO TABS: 30.0000 mg | ORAL_TABLET | Freq: Every day | ORAL | 1 refills | Status: DC

## 2017-05-22 MED ORDER — AZITHROMYCIN 250 MG PO TABS: ORAL_TABLET | ORAL | 0 refills | Status: DC

## 2017-05-22 NOTE — Patient Instructions (Signed)
Start Azithromycin antibiotics for infection. Take 2 tablets by mouth today, then 1 tablet daily for 4 additional days.  Continue Nasonex. Ensure you are staying hydrated with water.  We've increased your paroxetine (Paxil) from 20 mg to 30 mg. Take 1 tablet by mouth once daily.   Schedule a follow up visit with Dr. Caryl Bis in 4 weeks.  It was a pleasure meeting you!

## 2017-05-22 NOTE — Progress Notes (Signed)
Subjective:    Patient ID: Nishtha Raider, female    DOB: 1956/02/12, 62 y.o.   MRN: [id number]  HPI  Ms. Stoneberg is a 62 year old female with a history of allergic rhinitis, recurrent sinusitis, GERD who presents today with a chief complaint of sinus pressure. She also reports depression and would like to discuss.   1) Sinus Pressure: She also reports nasal congestion, cough, congestion, fevers, post nasal drip. She's been using Nasonex, tylenol, and OTC antihistamine without much improvement. She's expelling thick yellow mucous from her nasal cavity. Her fevers are running 100 at night usually. She was last treated for sinusitis several months ago.  She does follow with ENT and has undergone CT sinus scans.  Her symptoms began 3 weeks ago, overall no improvement.  2) Anxiety and Depression: Symptoms include little motivation to do anything, feeling depressed, doesn't want to leave the house, difficulty sleeping. Currently managed on paroxetine 20 mg once daily. She was once taking Seroquel 50 mg for which she's not taken in 2 weeks as she didn't like the way it made her feel. PHQ 9 score of 15 today. She denies SI/HI. She's tried trazodone in the past which didn't help with sleep. She has tried Effexor in the past which caused amnesia, she had hallucinations with amitriptyline    Review of Systems  Constitutional: Positive for fatigue and fever.  HENT: Positive for congestion, sinus pressure and sinus pain. Negative for ear pain and sore throat.   Respiratory: Positive for cough. Negative for shortness of breath.   Cardiovascular: Negative for chest pain.       Past Medical History:  Diagnosis Date  . Anxiety   . Arthritis   . Depression   . Frequent headaches   . Migraine   . Psoriatic arthritis ([place])      Social History   Socioeconomic History  . Marital status: Married    Spouse name: [name]  . Number of children: 2  . Years of education: 67  . Highest education level: Not on  file  Social Needs  . Financial resource strain: Not on file  . Food insecurity - worry: Not on file  . Food insecurity - inability: Not on file  . Transportation needs - medical: Not on file  . Transportation needs - non-medical: Not on file  Occupational History    Employer: OTHER  Tobacco Use  . Smoking status: Never Smoker  . Smokeless tobacco: Never Used  Substance and Sexual Activity  . Alcohol use: No  . Drug use: No  . Sexual activity: No    Birth control/protection: Surgical  Other Topics Concern  . Not on file  Social History Narrative   [place] lives at home with her husband, [name], of 5 months and a cat named, Puff. She has 2 sons from a previous marriage. She has 4 grandchildren.     Past Surgical History:  Procedure Laterality Date  . BREAST SURGERY  2000   biopsy  . NASAL SINUS SURGERY  1998  . TONSILLECTOMY AND ADENOIDECTOMY  1985  . TUBAL LIGATION    . TYMPANOSTOMY TUBE PLACEMENT  1998  . VENTRICULAR ATRIAL SHUNT Right 1995    Family History  Problem Relation Age of Onset  . Arthritis Mother   . Hyperlipidemia Mother   . Heart disease Mother 79       Died of heart failure  . Hypertension Mother   . Diabetes Mother   . Arthritis Father   .  Cancer Father        prostate cancer  . Diabetes Brother     Allergies  Allergen Reactions  . Amitriptyline Other (See Comments)    hallucinations  . Effexor [Venlafaxine] Other (See Comments)    Amnesia, hospitalized x 2 weeks  . Haldol [Haloperidol]     Paralyzed vocal cords   . Vicodin [Hydrocodone-Acetaminophen] Itching    Current Outpatient Medications on File Prior to Visit  Medication Sig Dispense Refill  . ferrous sulfate 325 (65 FE) MG EC tablet Take 1 tablet (325 mg total) 2 (two) times daily by mouth. 60 tablet 3  . fluticasone (FLONASE) 50 MCG/ACT nasal spray Place 2 sprays into both nostrils daily. 16 g 6  . naproxen (NAPROSYN) 500 MG tablet Take 1 tablet (500 mg total) by mouth 2 (two) times  daily with a meal. 30 tablet 0  . PARoxetine (PAXIL) 20 MG tablet take 1 tablet by mouth once daily 30 tablet 1   Current Facility-Administered Medications on File Prior to Visit  Medication Dose Route Frequency Provider Last Rate Last Dose  . betamethasone acetate-betamethasone sodium phosphate (CELESTONE) injection 3 mg  3 mg Intramuscular Once Evans, Brent M, DPM        BP 124/74   Pulse 65   Temp 98 F (36.7 C) (Oral)   Wt 191 lb (86.6 kg)   SpO2 98%   BMI 30.83 kg/m    Objective:   Physical Exam  Constitutional: She appears well-nourished. She does not appear ill.  HENT:  Right Ear: Tympanic membrane and ear canal normal.  Left Ear: Tympanic membrane and ear canal normal.  Nose: Mucosal edema present. Right sinus exhibits frontal sinus tenderness. Right sinus exhibits no maxillary sinus tenderness. Left sinus exhibits frontal sinus tenderness. Left sinus exhibits no maxillary sinus tenderness.  Mouth/Throat: Oropharynx is clear and moist.  Eyes: Conjunctivae are normal.  Neck: Neck supple.  Cardiovascular: Normal rate and regular rhythm.  Pulmonary/Chest: Effort normal and breath sounds normal. She has no wheezes. She has no rales.  Lymphadenopathy:    She has no cervical adenopathy.  Skin: Skin is warm and dry.  Psychiatric: She has a normal mood and affect.          Assessment & Plan:  Acute sinusitis:  Sinus pressure, nasal congestion, low-grade fevers times 3 weeks. Exam today most likely consistent for allergic rhinitis, potential bacterial sinusitis given moderate maxillary sinus tenderness with nasal mucosa edema. Discussed potential problems with recurrent antibiotic use from current infection, recommended she follow-up with her ENT for her next sinus infection. Prescription for azithromycin sent to pharmacy, she experiences GI upset with Augmentin and amoxicillin. Continue Flonase, stay hydrated with water.  Sheral Flow, NP

## 2017-05-22 NOTE — Assessment & Plan Note (Signed)
Seems deteriorated, PHQ 9 score of 15 today. Discussed options and given sensitivity to other medications in the past, will start with an increase of Paxil from 20 mg to 30 mg. Discussed for her to follow-up with her PCP in 4 weeks for reevaluation.  She denies SI/HI.

## 2017-06-04 ENCOUNTER — Encounter: Payer: Self-pay | Admitting: Family Medicine

## 2017-06-04 MED ORDER — QUETIAPINE FUMARATE 100 MG PO TABS: 100.0000 mg | ORAL_TABLET | Freq: Every day | ORAL | 3 refills | Status: DC

## 2017-06-12 ENCOUNTER — Encounter: Payer: Self-pay | Admitting: Family Medicine

## 2017-06-12 DIAGNOSIS — K0889 Other specified disorders of teeth and supporting structures: Secondary | ICD-10-CM

## 2017-06-12 NOTE — Telephone Encounter (Signed)
I have placed a referral. Can you arrange this for the patient? Thanks.

## 2017-06-19 ENCOUNTER — Ambulatory Visit: Payer: Medicare Other | Admitting: Primary Care

## 2017-07-31 ENCOUNTER — Other Ambulatory Visit: Payer: Self-pay | Admitting: Primary Care

## 2017-07-31 DIAGNOSIS — F32A Depression, unspecified: Secondary | ICD-10-CM

## 2017-07-31 DIAGNOSIS — F419 Anxiety disorder, unspecified: Principal | ICD-10-CM

## 2017-07-31 DIAGNOSIS — F329 Major depressive disorder, single episode, unspecified: Secondary | ICD-10-CM

## 2017-07-31 NOTE — Telephone Encounter (Signed)
Please get her scheduled for an appointment.  Refill sent to pharmacy.  She needs to follow-up with me in the office in the next month or so.  Thanks.

## 2017-07-31 NOTE — Telephone Encounter (Signed)
Ok to refill? Electronically refill request. Patient saw Allie Bossier on 05/22/2017. This is Dr Ellen Henri patient. Please advise.

## 2017-08-25 ENCOUNTER — Other Ambulatory Visit: Payer: Self-pay | Admitting: Family Medicine

## 2017-08-25 DIAGNOSIS — F419 Anxiety disorder, unspecified: Principal | ICD-10-CM

## 2017-08-25 DIAGNOSIS — F329 Major depressive disorder, single episode, unspecified: Secondary | ICD-10-CM

## 2017-08-25 DIAGNOSIS — F32A Depression, unspecified: Secondary | ICD-10-CM

## 2017-09-01 ENCOUNTER — Other Ambulatory Visit: Payer: Self-pay | Admitting: Family Medicine

## 2017-09-01 ENCOUNTER — Encounter: Payer: Self-pay | Admitting: Family Medicine

## 2017-09-01 DIAGNOSIS — F329 Major depressive disorder, single episode, unspecified: Secondary | ICD-10-CM

## 2017-09-01 DIAGNOSIS — F419 Anxiety disorder, unspecified: Principal | ICD-10-CM

## 2017-09-22 ENCOUNTER — Other Ambulatory Visit: Payer: Self-pay | Admitting: Family Medicine

## 2017-09-23 NOTE — Telephone Encounter (Signed)
The patient should be taking 100 mg daily of seroquel.  Please confirm her dosing with her.  I will be happy to provide a 30-day supply to cover until she establishes with a new provider.

## 2017-09-23 NOTE — Telephone Encounter (Signed)
Last OV 12/03/16 last filled 06/04/17 30 3rf. Patient is scheduled with Cassell Smiles at [facility] for a new patient appointment on 10/09/17

## 2017-09-25 ENCOUNTER — Ambulatory Visit (INDEPENDENT_AMBULATORY_CARE_PROVIDER_SITE_OTHER): Payer: Medicare Other | Admitting: Family

## 2017-09-25 ENCOUNTER — Encounter: Payer: Self-pay | Admitting: Family

## 2017-09-25 VITALS — BP 138/92 | HR 82 | Temp 98.0°F | Wt 196.1 lb

## 2017-09-25 DIAGNOSIS — F329 Major depressive disorder, single episode, unspecified: Secondary | ICD-10-CM | POA: Diagnosis not present

## 2017-09-25 DIAGNOSIS — H6691 Otitis media, unspecified, right ear: Secondary | ICD-10-CM | POA: Diagnosis not present

## 2017-09-25 DIAGNOSIS — F419 Anxiety disorder, unspecified: Secondary | ICD-10-CM | POA: Diagnosis not present

## 2017-09-25 MED ORDER — AMOXICILLIN-POT CLAVULANATE 875-125 MG PO TABS: 1.0000 | ORAL_TABLET | Freq: Two times a day (BID) | ORAL | 0 refills | Status: DC

## 2017-09-25 MED ORDER — PAROXETINE HCL 30 MG PO TABS: 30.0000 mg | ORAL_TABLET | Freq: Every day | ORAL | 0 refills | Status: DC

## 2017-09-25 NOTE — Telephone Encounter (Signed)
Patient states she is taking 100 mg daily

## 2017-09-25 NOTE — Patient Instructions (Signed)
Please begin augmentin for your right sided ear infection.  You may use tylenol or motrin as needed for pain. Call if symptoms worsen, if you develop fever >101 or if symptoms are not improved in 2-3 days.

## 2017-09-25 NOTE — Progress Notes (Signed)
Subjective:    Patient ID: Michele Newton, female    DOB: 1955-11-09, 62 y.o.   MRN: [id number]  HPI  Michele Newton is a 62 yr old female who presents today with c/o Headache, ear pressure x 4-5 days. Also reports +neck stiffness and "hot sweats."  She reports some nasal congestion.  Reports previous hx of sinus sugery and ear tubs.  Notes pain in the back of her had and neck.  Reports feeling clammy then cold/clammy.  She has tried sinus medication, aleve, tylenol and ibuprofen without much improvement.  Had "fluttering" in her left ear last night.   Anxiety/depression- maintained on paxil. Reports symptoms are fairly stable. Has appointment with PCP on 6/17 and is about to run out medication. Requesting refill to get her to her upcoming appointment.      Review of Systems See HPI  Past Medical History:  Diagnosis Date  . Anxiety   . Arthritis   . Depression   . Frequent headaches   . Migraine   . Psoriatic arthritis ([place])      Social History   Socioeconomic History  . Marital status: Married    Spouse name: Michele Newton  . Number of children: 2  . Years of education: 61  . Highest education level: Not on file  Occupational History    Employer: OTHER  Social Needs  . Financial resource strain: Not on file  . Food insecurity:    Worry: Not on file    Inability: Not on file  . Transportation needs:    Medical: Not on file    Non-medical: Not on file  Tobacco Use  . Smoking status: Never Smoker  . Smokeless tobacco: Never Used  Substance and Sexual Activity  . Alcohol use: No  . Drug use: No  . Sexual activity: Never    Birth control/protection: Surgical  Lifestyle  . Physical activity:    Days per week: Not on file    Minutes per session: Not on file  . Stress: Not on file  Relationships  . Social connections:    Talks on phone: Not on file    Gets together: Not on file    Attends religious service: Not on file    Active member of club or organization: Not on file   Attends meetings of clubs or organizations: Not on file    Relationship status: Not on file  . Intimate partner violence:    Fear of current or ex partner: Not on file    Emotionally abused: Not on file    Physically abused: Not on file    Forced sexual activity: Not on file  Other Topics Concern  . Not on file  Social History Narrative   Michele Newton lives at home with her husband, Michele Newton, of 5 months and a cat named, Michele Newton. She has 2 sons from a previous marriage. She has 4 grandchildren.     Past Surgical History:  Procedure Laterality Date  . BREAST SURGERY  2000   biopsy  . NASAL SINUS SURGERY  1998  . TONSILLECTOMY AND ADENOIDECTOMY  1985  . TUBAL LIGATION    . TYMPANOSTOMY TUBE PLACEMENT  1998  . VENTRICULAR ATRIAL SHUNT Right 1995    Family History  Problem Relation Age of Onset  . Arthritis Mother   . Hyperlipidemia Mother   . Heart disease Mother 22       Died of heart failure  . Hypertension Mother   . Diabetes Mother   .  Arthritis Father   . Cancer Father        prostate cancer  . Diabetes Brother     Allergies  Allergen Reactions  . Amitriptyline Other (See Comments)    hallucinations  . Effexor [Venlafaxine] Other (See Comments)    Amnesia, hospitalized x 2 weeks  . Haldol [Haloperidol]     Paralyzed vocal cords   . Vicodin [Hydrocodone-Acetaminophen] Itching    Current Outpatient Medications on File Prior to Visit  Medication Sig Dispense Refill  . ferrous sulfate 325 (65 FE) MG EC tablet Take 1 tablet (325 mg total) 2 (two) times daily by mouth. 60 tablet 3  . fluticasone (FLONASE) 50 MCG/ACT nasal spray Place 2 sprays into both nostrils daily. 16 g 6  . naproxen (NAPROSYN) 500 MG tablet Take 1 tablet (500 mg total) by mouth 2 (two) times daily with a meal. 30 tablet 0  . PARoxetine (PAXIL) 30 MG tablet TAKE 1 TABLET BY MOUTH EVERY DAY 30 tablet 0  . QUEtiapine (SEROQUEL) 100 MG tablet Take 1 tablet (100 mg total) by mouth at bedtime. 30 tablet 3    Current Facility-Administered Medications on File Prior to Visit  Medication Dose Route Frequency Provider Last Rate Last Dose  . betamethasone acetate-betamethasone sodium phosphate (CELESTONE) injection 3 mg  3 mg Intramuscular Once Evans, Brent M, DPM        BP (!) 138/92 (BP Location: Left Arm, Patient Position: Sitting, Cuff Size: Large)   Pulse 82   Temp 98 F (36.7 C) (Oral)   Wt 196 lb 2 oz (89 kg)   SpO2 99%   BMI 31.66 kg/m       Objective:   Physical Exam  Constitutional: She appears well-developed and well-nourished.  HENT:  Mouth/Throat: No oropharyngeal exudate or posterior oropharyngeal edema.  R TM is erythematous with central small perforation  L TM is dull, no erythema or bulging.   Mild posterior oropharyngeal erythema    Cardiovascular: Normal rate, regular rhythm and normal heart sounds.  No murmur heard. Pulmonary/Chest: Effort normal and breath sounds normal. No respiratory distress. She has no wheezes.  Psychiatric: She has a normal mood and affect. Her behavior is normal. Judgment and thought content normal.          Assessment & Plan:  Right otitis media- has perforated ear  (this is chronic per patient and ENT has chosen not to repair). Will rx with Augmentin. She is advised as follows:   You may use tylenol or motrin as needed for pain. Call if symptoms worsen, if you develop fever >101 or if symptoms are not improved in 2-3 days.   Anxiety/Depression- fair control. Refill provided. Pt advised to keep upcoming appointment with PCP.

## 2017-10-03 DIAGNOSIS — Z982 Presence of cerebrospinal fluid drainage device: Secondary | ICD-10-CM | POA: Diagnosis not present

## 2017-10-03 DIAGNOSIS — R51 Headache: Secondary | ICD-10-CM | POA: Diagnosis not present

## 2017-10-07 DIAGNOSIS — R51 Headache: Secondary | ICD-10-CM | POA: Diagnosis not present

## 2017-10-09 ENCOUNTER — Ambulatory Visit (INDEPENDENT_AMBULATORY_CARE_PROVIDER_SITE_OTHER): Payer: Medicare Other | Admitting: Nurse Practitioner

## 2017-10-09 ENCOUNTER — Encounter: Payer: Self-pay | Admitting: Nurse Practitioner

## 2017-10-09 ENCOUNTER — Telehealth: Payer: Self-pay

## 2017-10-09 VITALS — BP 132/82 | HR 82 | Temp 98.6°F | Ht 66.0 in | Wt 196.4 lb

## 2017-10-09 DIAGNOSIS — Z1231 Encounter for screening mammogram for malignant neoplasm of breast: Secondary | ICD-10-CM

## 2017-10-09 DIAGNOSIS — I10 Essential (primary) hypertension: Secondary | ICD-10-CM | POA: Diagnosis not present

## 2017-10-09 DIAGNOSIS — Z7689 Persons encountering health services in other specified circumstances: Secondary | ICD-10-CM

## 2017-10-09 DIAGNOSIS — Z1239 Encounter for other screening for malignant neoplasm of breast: Secondary | ICD-10-CM

## 2017-10-09 DIAGNOSIS — G441 Vascular headache, not elsewhere classified: Secondary | ICD-10-CM | POA: Diagnosis not present

## 2017-10-09 DIAGNOSIS — R51 Headache: Secondary | ICD-10-CM | POA: Diagnosis not present

## 2017-10-09 DIAGNOSIS — R519 Headache, unspecified: Secondary | ICD-10-CM

## 2017-10-09 MED ORDER — LISINOPRIL 10 MG PO TABS: 10.0000 mg | ORAL_TABLET | Freq: Every day | ORAL | 1 refills | Status: DC

## 2017-10-09 NOTE — Progress Notes (Signed)
Subjective:    Patient ID: Michele Newton, female    DOB: 01-Oct-1955, 61 y.o.   MRN: [id number]  Michele Newton is a 62 y.o. female presenting on 10/09/2017 for Establish Care (severe headache x2 weeks,pt been followed by Neurologist and put on bp medication )   HPI [place] Provider Pt last seen by PCP [facility] approximately 1 month ago.  Obtain records from [facility] for Neurology Chipper Herb, NP).    Frequent Headache / Hypertension Pt has history of IIH and VP shunt.  Has had recent shunt series and states shunt is functioning well. - Labile BP with readings in 130s then up to 160s.   - Patient has had some vision changes, pain with brushing her hair on the right side of her head, pressure behind eyes, pain with putting on make-up. 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   - She was treated for migraines by her neurologist without any relief from Fioricet or prednisone to avoid OTC use of Naproxen.   - She was also treated with Augmentin recently by Alma Friendly (last week per pt, although last record in [facility] available today is from January).  Pt states she stopped the Augmentin 2 days ago.  Health Maintenance: Last mammo approx 2006.  Pt reports she was heavily bruised and sore after and does not desire to repeat.  She does have history of breast Ca in MGM, early death age for mother.    Last pap reported 2006 Last Td reported 2008 Last colonoscopy 2000  Past Medical History:  Diagnosis Date  . Anxiety   . Arthritis   . Depression   . Frequent headaches   . Migraine   . Psoriatic arthritis [facility])    Past Surgical History:  Procedure Laterality Date  . BREAST SURGERY  2000   biopsy  . NASAL SINUS SURGERY  1998  . TONSILLECTOMY AND ADENOIDECTOMY  1985  . TUBAL LIGATION    . TYMPANOSTOMY TUBE PLACEMENT  1998  . VENTRICULAR ATRIAL SHUNT Right 1995   Social History   Socioeconomic History  . Marital status: Married   Spouse name: [name]  . Number of children: 2  . Years of education: 52  . Highest education level: Not on file  Occupational History    Employer: OTHER  Social Needs  . Financial resource strain: Not on file  . Food insecurity:    Worry: Not on file    Inability: Not on file  . Transportation needs:    Medical: Not on file    Non-medical: Not on file  Tobacco Use  . Smoking status: Never Smoker  . Smokeless tobacco: Never Used  Substance and Sexual Activity  . Alcohol use: No  . Drug use: No  . Sexual activity: Never    Birth control/protection: Surgical  Lifestyle  . Physical activity:    Days per week: Not on file    Minutes per session: Not on file  . Stress: Not on file  Relationships  . Social connections:    Talks on phone: Not on file    Gets together: Not on file    Attends religious service: Not on file    Active member of club or organization: Not on file    Attends meetings of clubs or organizations: Not on file    Relationship status: Not on file  . Intimate partner violence:    Fear of current or ex partner: Not  on file    Emotionally abused: Not on file    Physically abused: Not on file    Forced sexual activity: Not on file  Other Topics Concern  . Not on file  Social History Narrative   [place] lives at home with her husband, [name], of 5 months and a cat named, Puff. She has 2 sons from a previous marriage. She has 4 grandchildren.    Family History  Problem Relation Age of Onset  . Arthritis Mother   . Hyperlipidemia Mother   . Heart disease Mother 68       Died of heart failure  . Hypertension Mother   . Diabetes Mother   . Depression Mother   . Arthritis Father   . Cancer Father        prostate cancer  . Diabetes Brother    Current Outpatient Medications on File Prior to Visit  Medication Sig  . amoxicillin-clavulanate (AUGMENTIN) 875-125 MG tablet Take 1 tablet by mouth 2 (two) times daily.  . Butalbital-APAP-Caffeine 50-325-40 MG capsule  Take 1 tab at headache onset can repeat once in 4 hours if needed, no more than 2 doses in 24 hours  . fluticasone (FLONASE) 50 MCG/ACT nasal spray Place 2 sprays into both nostrils daily.  . montelukast (SINGULAIR) 10 MG tablet TK 1 T PO HS  . PARoxetine (PAXIL) 30 MG tablet Take 1 tablet (30 mg total) by mouth daily.  . QUEtiapine (SEROQUEL) 100 MG tablet Take 1 tablet (100 mg total) by mouth at bedtime.  . verapamil (CALAN-SR) 120 MG CR tablet Take by mouth.   Current Facility-Administered Medications on File Prior to Visit  Medication  . betamethasone acetate-betamethasone sodium phosphate (CELESTONE) injection 3 mg    Review of Systems Per HPI unless specifically indicated above     Objective:    BP 132/82 (BP Location: Right Arm, Patient Position: Sitting, Cuff Size: Normal)   Pulse 82   Temp 98.6 F (37 C) (Oral)   Ht 5' 6"  (1.676 m)   Wt 196 lb 6.4 oz (89.1 kg)   BMI 31.70 kg/m    Wt Readings from Last 3 Encounters:  10/09/17 196 lb 6.4 oz (89.1 kg)  09/25/17 196 lb 2 oz (89 kg)  05/22/17 191 lb (86.6 kg)    Physical Exam  Constitutional: She is oriented to person, place, and time. She appears well-developed and well-nourished. No distress.  HENT:  Head: Normocephalic and atraumatic.  Eyes: Pupils are equal, round, and reactive to light. EOM are normal.  Fundoscopic exam:      The right eye shows no AV nicking, no exudate, no hemorrhage and no papilledema. The right eye shows red reflex.       The left eye shows no AV nicking, no exudate, no hemorrhage and no papilledema. The left eye shows red reflex.  Neck: Normal range of motion. Neck supple. Carotid bruit is not present.  Cardiovascular: Normal rate, regular rhythm, S1 normal, S2 normal, normal heart sounds and intact distal pulses.  Pulmonary/Chest: Effort normal and breath sounds normal. No respiratory distress.  Musculoskeletal: She exhibits no edema (pedal).  Neurological: She is alert and oriented to  person, place, and time. She has normal strength and normal reflexes. No cranial nerve deficit or sensory deficit. She displays a negative Romberg sign. Gait normal.  Skin: Skin is warm and dry. Capillary refill takes less than 2 seconds.  Psychiatric: She has a normal mood and affect. Her behavior is normal. Thought content  normal.  Vitals reviewed.   Results for orders placed or performed in visit on 03/06/17  CBC with Differential/Platelet  Result Value Ref Range   WBC 7.3 3.6 - 11.0 K/uL   RBC 5.15 3.80 - 5.20 MIL/uL   Hemoglobin 14.7 12.0 - 16.0 g/dL   HCT 43.1 35.0 - 47.0 %   MCV 83.8 80.0 - 100.0 fL   MCH 28.6 26.0 - 34.0 pg   MCHC 34.1 32.0 - 36.0 g/dL   RDW 13.5 11.5 - 14.5 %   Platelets 400 150 - 440 K/uL   Neutrophils Relative % 58 %   Neutro Abs 4.2 1.4 - 6.5 K/uL   Lymphocytes Relative 30 %   Lymphs Abs 2.2 1.0 - 3.6 K/uL   Monocytes Relative 10 %   Monocytes Absolute 0.7 0.2 - 0.9 K/uL   Eosinophils Relative 2 %   Eosinophils Absolute 0.1 0 - 0.7 K/uL   Basophils Relative 0 %   Basophils Absolute 0.0 0 - 0.1 K/uL  Comprehensive metabolic panel  Result Value Ref Range   Sodium 137 135 - 145 mmol/L   Potassium 4.0 3.5 - 5.1 mmol/L   Chloride 104 101 - 111 mmol/L   CO2 24 22 - 32 mmol/L   Glucose, Bld 103 (H) 65 - 99 mg/dL   BUN 18 6 - 20 mg/dL   Creatinine, Ser 0.81 0.44 - 1.00 mg/dL   Calcium 8.7 (L) 8.9 - 10.3 mg/dL   Total Protein 7.3 6.5 - 8.1 g/dL   Albumin 4.1 3.5 - 5.0 g/dL   AST 32 15 - 41 U/L   ALT 47 14 - 54 U/L   Alkaline Phosphatase 95 38 - 126 U/L   Total Bilirubin 0.5 0.3 - 1.2 mg/dL   GFR calc non Af Amer >60 >60 mL/min   GFR calc Af Amer >60 >60 mL/min   Anion gap 9 5 - 15  Iron and TIBC  Result Value Ref Range   Iron 76 28 - 170 ug/dL   TIBC 353 250 - 450 ug/dL   Saturation Ratios 22 10.4 - 31.8 %   UIBC 277 ug/dL  Lactate dehydrogenase  Result Value Ref Range   LDH 178 98 - 192 U/L  JAK2 V617F, w Reflex to CALR/E12/MPL  Result  Value Ref Range   JAK2 GenotypR Comment    BACKGROUND: Comment    Director Review, JAK2 Comment    REFLEX: Comment    Extraction Completed   BCR-ABL1 FISH  Result Value Ref Range   Specimen Type BLOOD    Cells Counted 200    Cells Analyzed 200    FISH Result Comment:    Interpretation Comment:    Director Review: Comment:    PDF .   Ferritin  Result Value Ref Range   Ferritin 57 11 - 307 ng/mL  CALR + JAK2 E12-15 + MPL (reflexed)  Result Value Ref Range   CALR Mutation Detection Result Comment    Background: Comment    Methodology: Comment    References: Comment    Director Review Comment    JAK2 Exons 12-15 Mut Det PCR: Comment    Indications NISPEC    Specimen Type Comment    BACKGROUND: Comment    Method Comment    References Comment    DIRECTOR REVIEW: Comment    MPL MUTATION ANALYSIS RESULT: Comment    BACKGROUND: Comment    METHODOLOGY: Comment    REFERENCES: Comment    DIRECTOR REVIEW: Comment  Extraction Comment       Assessment & Plan:   Problem List Items Addressed This Visit      Other   Headache 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  Cannot exclude temporal arteritis as cause of headache. - Send labs today. - After labs, may need to send for biopsy at vascular if positive for inflammation - followup prn with me and continue with neurology appointments as scheduled   Relevant Orders   COMPLETE METABOLIC PANEL WITH GFR (Completed)   Sed Rate (ESR) (Completed)   C-reactive protein (Completed)    Other Visit Diagnoses    Essential hypertension    -  Primary Uncontrolled hypertension.  BP goal < 130/80.  Pt is not working on lifestyle modifications.  Taking medications tolerating well without side effects. Present complications: IIH  Plan: 1. Continue taking verapamil - START lisinopril 10 mg once daily 2. Obtain labs today  3. Encouraged heart  healthy diet and increasing exercise to 30 minutes most days of the week. 4. Check BP 1-2 x per week at home, keep log, and bring to clinic at next appointment. 5. Follow up 1 month.     Relevant Orders   COMPLETE METABOLIC PANEL WITH GFR (Completed)   Encounter to establish care   Previous PCP was at [facility].  Records are reviewed in [facility].  Past medical, family, and surgical history reviewed w/ patient in clinic today.       Breast cancer screening     Pt last mammogram negative, > 1 year ago.  Plan: 1. Screening mammogram order placed.  Pt will call to schedule appointment.  Information given.   Relevant Orders   MM DIGITAL SCREENING BILATERAL      Meds ordered this encounter  Medications  . lisinopril (PRINIVIL,ZESTRIL) 10 MG tablet    Sig: Take 1 tablet (10 mg total) by mouth daily.    Dispense:  90 tablet    Refill:  1    Order Specific Question:   Supervising Provider    Answer:   Olin Hauser [[id number]]    Follow up plan: Return in about 1 month (around 11/06/2017) for headache and hypertension.  Cassell Smiles, DNP, AGPCNP-BC Adult Gerontology Primary Care Nurse Practitioner [facility] 10/09/2017, 9:49 AM

## 2017-10-09 NOTE — Patient Instructions (Addendum)
Michele Newton,   Thank you for coming in to clinic today.  1. We are looking for temporal arteritis - Labs today and Ophthalmology visit Monday.  We will call if we can get one sooner.  2. Hypertension: - Continue verapamil - START lisinopril 10 mg once daily.  Please schedule a follow-up appointment with Cassell Smiles, AGNP. Return in about 1 month (around 11/06/2017) for headache and hypertension.  If you have any other questions or concerns, please feel free to call the clinic or send a message through [place]. You may also schedule an earlier appointment if necessary.  You will receive a survey after today's visit either digitally by e-mail or paper by [employer]. Your experiences and feedback matter to [place].  Please respond so we know how we are doing as we provide care for you.   Cassell Smiles, DNP, AGNP-BC Adult Gerontology Nurse Practitioner [facility], [facility]    Temporal Arteritis Temporal arteritis, also called giant cell arteritis, is a condition that causes arteries to become swollen (inflamed). It usually affects arteries in your head and face, but arteries in any part of the body can become inflamed. Temporal arteritis can cause serious problems, such as bone loss, diabetes, and blindness. What are the causes? The cause is unknown. What increases the risk?  Being older than 67.  Being a woman.  Being Caucasian.  Being of [place], [place], [place], [place] or [place] ancestry.  Having polymyalgia rheumatica (PMR). What are the signs or symptoms? Some people with temporal arteritis have just one symptom, while other have several symptoms. Most signs and symptoms are related to the head and face. Signs and symptoms may include:  Hard or swollen temples (common). Your temples are the flattened area on either side of your forehead. If your temples are swollen, it may hurt to touch them.  Pain when combing your hair or when laying your head  down.  Pain in the jaw when chewing.  Pain in the throat or tongue.  Problems with your vision, such as sudden loss of vision in one eye, or seeing double.  Fever.  Fatigue.  A dry cough.  Pain in the hips and shoulders.  Pain in the arms during exercise.  Depression.  Weight loss.  How is this diagnosed? Your health care provider will ask about your symptoms and do a physical exam. He or she may also perform an eye exam and tests, such as:  A complete blood count.  An erythrocyte sedimentation rate test, also called the sed rate test.  A C-reactive protein (CRP) test.  A tissue sample (biopsy) test.  How is this treated? Temporal arteritis is treated with a type of medicine called a corticosteroid. Vision problems may be treated with additional medicines. You will need to see your health care provider while you are being treated. During follow-up visits, your health care provider will check for problems by:  Performing blood tests and bone density tests.  Checking your blood pressure and blood sugar.  Follow these instructions at home:  Take medicines only as directed by your health care provider.  Take any vitamins or supplements that your health care provider suggests. These may include vitamin D and calcium, which help keep your bones from becoming weak.  Exercise. Talk with your health care provider about what exercises are okay for you to do. Usually exercises that increase your heart rate (aerobic exercise), such as walking, are recommended. Aerobic exercise helps control your blood pressure and prevent  bone loss.  Follow a healthy diet. Include healthy sources of protein, fruits, vegetables, and whole grains in your diet. Following a healthy diet helps prevent bone damage and diabetes. Contact a health care provider if:  Your symptoms get worse.  Your fever, fatigue, headache, weight loss, or pain in your jaw gets worse.  You develop signs of infection,  such as fever, swelling, redness, warmth, and tenderness. Get help right away if:  Your vision gets worse.  Your pain does not go away, even after you take pain medicine.  You have chest pain.  You have trouble breathing.  One side of your face or body suddenly becomes weak or numb. This information is not intended to replace advice given to you by your health care provider. Make sure you discuss any questions you have with your health care provider. Document Released: 02/17/2009 Document Revised: 12/21/2015 Document Reviewed: 06/16/2013 Elsevier Interactive Patient Education  [employer].

## 2017-10-09 NOTE — Telephone Encounter (Signed)
I call and spoke w/ Whitney from [facility] about moving up the patient appt. She informed me that they currently do not have any sooner appointment, but she will put a message into the nursing staff to review the patient reason for visit. To see if they can override the schedule and add her only sooner.

## 2017-10-10 LAB — COMPLETE METABOLIC PANEL WITH GFR
AG Ratio: 1.9 (calc) (ref 1.0–2.5)
ALT: 26 U/L (ref 6–29)
AST: 19 U/L (ref 10–35)
Albumin: 4.4 g/dL (ref 3.6–5.1)
Alkaline phosphatase (APISO): 95 U/L (ref 33–130)
BUN: 13 mg/dL (ref 7–25)
CO2: 24 mmol/L (ref 20–32)
Calcium: 9.7 mg/dL (ref 8.6–10.4)
Chloride: 104 mmol/L (ref 98–110)
Creat: 0.9 mg/dL (ref 0.50–0.99)
GFR, Est African American: 79 mL/min/{1.73_m2} (ref >=60)
GFR, Est Non African American: 69 mL/min/{1.73_m2} (ref >=60)
Globulin: 2.3 g/dL (calc) (ref 1.9–3.7)
Glucose, Bld: 87 mg/dL (ref 65–99)
Potassium: 4.4 mmol/L (ref 3.5–5.3)
Sodium: 139 mmol/L (ref 135–146)
Total Bilirubin: 0.4 mg/dL (ref 0.2–1.2)
Total Protein: 6.7 g/dL (ref 6.1–8.1)

## 2017-10-10 LAB — C-REACTIVE PROTEIN: CRP: 7.8 mg/L (ref <8.0)

## 2017-10-10 LAB — SEDIMENTATION RATE: Sed Rate: 6 mm/h (ref 0–30)

## 2017-10-13 ENCOUNTER — Encounter: Payer: Self-pay | Admitting: Nurse Practitioner

## 2017-10-13 DIAGNOSIS — R51 Headache: Secondary | ICD-10-CM | POA: Diagnosis not present

## 2017-10-13 NOTE — Telephone Encounter (Signed)
Pt has taken lisinopril 10 mg for at least 3 days. Was 120/70 first thing in am.   Dr. Thomasene Ripple - great eye report today with normal eye pressure.  Pt also reports significant life stressors as possible impact to BP and headaches.  Headaches are resolving.  Have requested phone call from Chipper Herb, NP neurology for collaborative consult about next steps.  Can consider stress management, renal US, cardiology referral for labile BPs.

## 2017-10-16 ENCOUNTER — Other Ambulatory Visit: Payer: Self-pay | Admitting: Family Medicine

## 2017-10-20 ENCOUNTER — Ambulatory Visit: Payer: Medicare Other | Admitting: Family Medicine

## 2017-10-23 ENCOUNTER — Other Ambulatory Visit: Payer: Medicare Other

## 2017-10-24 ENCOUNTER — Other Ambulatory Visit: Payer: Self-pay | Admitting: Family Medicine

## 2017-10-24 ENCOUNTER — Other Ambulatory Visit: Payer: Self-pay | Admitting: Nurse Practitioner

## 2017-10-24 ENCOUNTER — Other Ambulatory Visit: Payer: Self-pay | Admitting: Family

## 2017-10-24 DIAGNOSIS — F32A Depression, unspecified: Secondary | ICD-10-CM

## 2017-10-24 DIAGNOSIS — F419 Anxiety disorder, unspecified: Principal | ICD-10-CM

## 2017-10-24 DIAGNOSIS — F329 Major depressive disorder, single episode, unspecified: Secondary | ICD-10-CM

## 2017-10-27 DIAGNOSIS — R51 Headache: Secondary | ICD-10-CM | POA: Diagnosis not present

## 2017-10-31 DIAGNOSIS — R51 Headache: Secondary | ICD-10-CM | POA: Diagnosis not present

## 2017-11-07 ENCOUNTER — Ambulatory Visit (INDEPENDENT_AMBULATORY_CARE_PROVIDER_SITE_OTHER): Payer: Medicare Other | Admitting: Nurse Practitioner

## 2017-11-07 ENCOUNTER — Encounter: Payer: Self-pay | Admitting: Nurse Practitioner

## 2017-11-07 ENCOUNTER — Telehealth: Payer: Self-pay

## 2017-11-07 VITALS — BP 137/68 | HR 78 | Resp 16 | Ht 66.0 in | Wt 191.6 lb

## 2017-11-07 DIAGNOSIS — J4520 Mild intermittent asthma, uncomplicated: Secondary | ICD-10-CM

## 2017-11-07 DIAGNOSIS — F419 Anxiety disorder, unspecified: Secondary | ICD-10-CM

## 2017-11-07 DIAGNOSIS — F32A Depression, unspecified: Secondary | ICD-10-CM

## 2017-11-07 DIAGNOSIS — G44211 Episodic tension-type headache, intractable: Secondary | ICD-10-CM | POA: Diagnosis not present

## 2017-11-07 DIAGNOSIS — I10 Essential (primary) hypertension: Secondary | ICD-10-CM | POA: Diagnosis not present

## 2017-11-07 DIAGNOSIS — F329 Major depressive disorder, single episode, unspecified: Secondary | ICD-10-CM

## 2017-11-07 MED ORDER — FLUTICASONE-SALMETEROL 100-50 MCG/DOSE IN AEPB: 1.0000 | INHALATION_SPRAY | Freq: Two times a day (BID) | RESPIRATORY_TRACT | 5 refills | Status: DC

## 2017-11-07 MED ORDER — ALBUTEROL SULFATE HFA 108 (90 BASE) MCG/ACT IN AERS: 1.0000 | INHALATION_SPRAY | Freq: Four times a day (QID) | RESPIRATORY_TRACT | 2 refills | Status: DC | PRN

## 2017-11-07 MED ORDER — VALSARTAN 80 MG PO TABS: 80.0000 mg | ORAL_TABLET | Freq: Every day | ORAL | 6 refills | Status: DC

## 2017-11-07 MED ORDER — BUDESONIDE-FORMOTEROL FUMARATE 80-4.5 MCG/ACT IN AERO: 2.0000 | INHALATION_SPRAY | Freq: Two times a day (BID) | RESPIRATORY_TRACT | 12 refills | Status: DC

## 2017-11-07 MED ORDER — PAROXETINE HCL 40 MG PO TABS: 40.0000 mg | ORAL_TABLET | Freq: Every day | ORAL | 6 refills | Status: DC

## 2017-11-07 NOTE — Progress Notes (Signed)
Subjective:    Patient ID: Michele Newton, female    DOB: 05/20/1955, 62 y.o.   MRN: [id number]  Michele Newton is a 62 y.o. female presenting on 11/07/2017 for Hypertension and Headache (small headache today)   HPI Hypertension - She is checking BP at home or outside of clinic. IN am is usually 130/70s, but is always 150-160/88 at home for BP readings by evening.  - Current medications: verapamil 120 mg once daily, lisinopril 10 mg once daily, tolerating well without side effects - She is symptomatic with headache.  Asthma More difficulty breathing with more hot days.  shortness of breath is worse with any activity.  Is still taking singulair. Is not currently taking any inhalers.  Anxiety Patient notes worsening anxiety and depression, would like to discuss changes.  Depression screen [facility] 2/9 02/06/2018 10/09/2017 05/22/2017 07/02/2016 10/01/2015  Decreased Interest 3 0 3 0 1  Down, Depressed, Hopeless 3 3 3  0 1  PHQ - 2 Score 6 3 6  0 2  Altered sleeping 3 0 3 - 1  Tired, decreased energy 3 3 3  - 2  Change in appetite 0 2 0 - 2  Feeling bad or failure about yourself  0 0 0 - 1  Trouble concentrating 2 0 1 - 1  Moving slowly or fidgety/restless 0 0 2 - 0  Suicidal thoughts 0 0 0 - 0  PHQ-9 Score 14 8 15  - 9  Difficult doing work/chores Very difficult Very difficult Somewhat difficult - -    GAD 7 : Generalized Anxiety Score 10/01/2015  Nervous, Anxious, on Edge 3  Control/stop worrying 2  Worry too much - different things 2  Trouble relaxing 2  Restless 0  Easily annoyed or irritable 3  Afraid - awful might happen 1  Total GAD 7 Score 13  Anxiety Difficulty Somewhat difficult    Social History   Tobacco Use  . Smoking status: Never Smoker  . Smokeless tobacco: Never Used  Substance Use Topics  . Alcohol use: No  . Drug use: No    Review of Systems Per HPI unless specifically indicated above     Objective:    BP 137/68   Pulse 78   Resp 16   Ht 5' 6"  (1.676 m)   Wt  191 lb 9.6 oz (86.9 kg)   SpO2 98%   BMI 30.93 kg/m   Wt Readings from Last 3 Encounters:  11/07/17 191 lb 9.6 oz (86.9 kg)  10/09/17 196 lb 6.4 oz (89.1 kg)  09/25/17 196 lb 2 oz (89 kg)    Physical Exam  Constitutional: She is oriented to person, place, and time. She appears well-developed and well-nourished. No distress.  HENT:  Head: Normocephalic and atraumatic.  Cardiovascular: Normal rate, regular rhythm, S1 normal, S2 normal, normal heart sounds and intact distal pulses.  Pulmonary/Chest: Effort normal. No respiratory distress.  Neurological: She is alert and oriented to person, place, and time.  Skin: Skin is warm and dry.  Psychiatric: Her speech is normal and behavior is normal. Her mood appears anxious.  Vitals reviewed.    Results for orders placed or performed in visit on 10/09/17  COMPLETE METABOLIC PANEL WITH GFR  Result Value Ref Range   Glucose, Bld 87 65 - 99 mg/dL   BUN 13 7 - 25 mg/dL   Creat 0.90 0.50 - 0.99 mg/dL   GFR, Est Non African American 69 > OR = 60 mL/min/1.64m   GFR, Est African American 79 >  OR = 60 mL/min/1.65m   BUN/Creatinine Ratio NOT APPLICABLE 6 - 22 (calc)   Sodium 139 135 - 146 mmol/L   Potassium 4.4 3.5 - 5.3 mmol/L   Chloride 104 98 - 110 mmol/L   CO2 24 20 - 32 mmol/L   Calcium 9.7 8.6 - 10.4 mg/dL   Total Protein 6.7 6.1 - 8.1 g/dL   Albumin 4.4 3.6 - 5.1 g/dL   Globulin 2.3 1.9 - 3.7 g/dL (calc)   AG Ratio 1.9 1.0 - 2.5 (calc)   Total Bilirubin 0.4 0.2 - 1.2 mg/dL   Alkaline phosphatase (APISO) 95 33 - 130 U/L   AST 19 10 - 35 U/L   ALT 26 6 - 29 U/L  Sed Rate (ESR)  Result Value Ref Range   Sed Rate 6 0 - 30 mm/h  C-reactive protein  Result Value Ref Range   CRP 7.8 <8.0 mg/L      Assessment & Plan:   Problem List Items Addressed This Visit      Cardiovascular and Mediastinum   Essential hypertension Uncontrolled hypertension.  BP goal < 130/80.   Plan: 1. Continue taking Verapamil - Change lisinopril to  valsartan.  START valsartan 80 mg once daily (net increase in medication action) 2. Encouraged heart healthy diet and increasing exercise to 30 minutes most days of the week. 3. Check BP 1-2 x per week at home, keep log, and bring to clinic at next appointment. 4. Follow up 3 months.       Respiratory   Mild intermittent asthma without complication Uncontrolled, but without exacerbation or other complication.  Continues Montelukast.   Resume symbicort 2 puffs bid during warm weather Resume albuterol rescue 1-2 puffs q 6 hr prn shortness of breath/wheezing     Other   Anxiety and depression Worsening and not at goals.  Increase paroxetine to 40 mg once daily.  Followup 3 months.    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  Need to optimize hypertension control.  See AP above.  Continue verapamil.  Follow-up prn.      Meds ordered this encounter  Medications  .  valsartan (DIOVAN) 80 MG tablet    Sig: Take 1 tablet (80 mg total) by mouth daily.    Dispense:  30 tablet    Refill:  6    Order Specific Question:   Supervising Provider    Answer:   KOlin Hauser[[id number]]  .  budesonide-formoterol (SYMBICORT) 80-4.5 MCG/ACT inhaler    Sig: Inhale 2 puffs into the lungs 2 (two) times daily.    Dispense:  1 Inhaler    Refill:  12    Order Specific Question:   Supervising Provider    Answer:   KOlin Hauser[[id number]]  .  albuterol (PROVENTIL HFA;VENTOLIN HFA) 108 (90 Base) MCG/ACT inhaler    Sig: Inhale 1-2 puffs into the lungs every 6 (six) hours as needed for shortness of breath.    Dispense:  1 Inhaler    Refill:  2    Product selection permitted for insurance/patient brand preference    Order Specific Question:   Supervising Provider    Answer:   KOlin Hauser[[id number]]  .  PARoxetine (PAXIL) 40 MG tablet    Sig: Take 1 tablet (40 mg total) by mouth daily.    Dispense:  30 tablet    Refill:  6    Can transition to  90-day fill after first 30-day  fill    Order Specific Question:   Supervising Provider    Answer:   Olin Hauser [[id number]]    Follow up plan: Return in about 3 months (around 02/07/2018) for hypertension.  Cassell Smiles, DNP, AGPCNP-BC Adult Gerontology Primary Care Nurse Practitioner [facility] 11/07/2017, 10:28 AM

## 2017-11-07 NOTE — Telephone Encounter (Signed)
Covers Proair and Advair or Ellipita ONLY please update to your choice to help with insurance covering. Send to [facility]. Will not need call back will only need you to fix and she will call pharmacy later.

## 2017-11-07 NOTE — Telephone Encounter (Signed)
Sent Rx as insurance prefers for [employer] and [employer]

## 2017-11-07 NOTE — Patient Instructions (Addendum)
Michele Newton,   Thank you for coming in to clinic today.  1. Increase paroxetine to 40 mg once daily for anxiety and depression  2. CHANGE lisinopril to valsartan.  Increase as well.  Take valsartan 80 mg once daily.  3. For asthma: - Resume Symbicort 2 puffs twice daily during summer months.  May try stopping in September to October. - Resume albuterol 1-2 puffs every 6 hours as needed for shortness of breath or wheezing.   Please schedule a follow-up appointment with Cassell Smiles, AGNP. Return in about 3 months (around 02/07/2018) for hypertension.  If you have any other questions or concerns, please feel free to call the clinic or send a message through [place]. You may also schedule an earlier appointment if necessary.  You will receive a survey after today's visit either digitally by e-mail or paper by [employer]. Your experiences and feedback matter to [place].  Please respond so we know how we are doing as we provide care for you.   Cassell Smiles, DNP, AGNP-BC Adult Gerontology Nurse Practitioner [place]

## 2017-11-11 ENCOUNTER — Telehealth: Payer: Self-pay

## 2017-11-11 DIAGNOSIS — J4520 Mild intermittent asthma, uncomplicated: Secondary | ICD-10-CM

## 2017-11-11 MED ORDER — IPRATROPIUM-ALBUTEROL 0.5-2.5 (3) MG/3ML IN SOLN: 3.0000 mL | Freq: Four times a day (QID) | RESPIRATORY_TRACT | 1 refills | Status: DC | PRN

## 2017-11-11 NOTE — Telephone Encounter (Signed)
Patient has nebulizer at home.  Advised patient to change out all filters.  Can provide new nebulizer supplies as needed with DME.   - START Duoneb up to 4 times daily for wheezing and shortness of breath. - No maintenance inhalers at this time 2/2 cost.

## 2017-11-11 NOTE — Addendum Note (Signed)
Addended by: Cleaster Corin on: 11/11/2017 05:54 PM   Modules accepted: Orders

## 2017-11-11 NOTE — Telephone Encounter (Signed)
I spoke with the patient about her prior auth for Symbicort. I called to f/u with the pt to see if she tired and failed Advair and Breo. The pt informed me that Lauren sent a new script for Advair, but she could not afford it. The cost all though its a covered medication on her insurance is still over 200.00. The pt states she is fine and only need the inhaler temporary when she have some SOB.

## 2017-11-18 ENCOUNTER — Encounter: Payer: Self-pay | Admitting: Nurse Practitioner

## 2017-11-23 ENCOUNTER — Other Ambulatory Visit: Payer: Self-pay | Admitting: Family Medicine

## 2017-11-24 ENCOUNTER — Other Ambulatory Visit: Payer: Self-pay | Admitting: Nurse Practitioner

## 2017-12-31 ENCOUNTER — Encounter: Payer: Self-pay | Admitting: Nurse Practitioner

## 2018-01-20 ENCOUNTER — Other Ambulatory Visit: Payer: Self-pay | Admitting: Family Medicine

## 2018-01-20 DIAGNOSIS — F329 Major depressive disorder, single episode, unspecified: Secondary | ICD-10-CM

## 2018-01-20 DIAGNOSIS — F419 Anxiety disorder, unspecified: Principal | ICD-10-CM

## 2018-01-20 MED ORDER — PAROXETINE HCL 40 MG PO TABS: 40.0000 mg | ORAL_TABLET | Freq: Every day | ORAL | 1 refills | Status: DC

## 2018-02-06 ENCOUNTER — Other Ambulatory Visit: Payer: Self-pay

## 2018-02-06 ENCOUNTER — Encounter: Payer: Self-pay | Admitting: Nurse Practitioner

## 2018-02-06 ENCOUNTER — Ambulatory Visit (INDEPENDENT_AMBULATORY_CARE_PROVIDER_SITE_OTHER): Payer: Medicare Other | Admitting: Nurse Practitioner

## 2018-02-06 VITALS — BP 142/75 | HR 82 | Temp 98.4°F | Ht 66.0 in | Wt 195.0 lb

## 2018-02-06 DIAGNOSIS — Z23 Encounter for immunization: Secondary | ICD-10-CM

## 2018-02-06 DIAGNOSIS — J45909 Unspecified asthma, uncomplicated: Secondary | ICD-10-CM | POA: Insufficient documentation

## 2018-02-06 DIAGNOSIS — I10 Essential (primary) hypertension: Secondary | ICD-10-CM | POA: Diagnosis not present

## 2018-02-06 DIAGNOSIS — G4709 Other insomnia: Secondary | ICD-10-CM | POA: Diagnosis not present

## 2018-02-06 DIAGNOSIS — J4520 Mild intermittent asthma, uncomplicated: Secondary | ICD-10-CM | POA: Diagnosis not present

## 2018-02-06 MED ORDER — MONTELUKAST SODIUM 10 MG PO TABS: ORAL_TABLET | ORAL | 3 refills | Status: DC

## 2018-02-06 MED ORDER — QUETIAPINE FUMARATE 200 MG PO TABS: 200.0000 mg | ORAL_TABLET | Freq: Every day | ORAL | 1 refills | Status: DC

## 2018-02-06 MED ORDER — VALSARTAN 160 MG PO TABS: 160.0000 mg | ORAL_TABLET | Freq: Every day | ORAL | 1 refills | Status: DC

## 2018-02-06 MED ORDER — ALBUTEROL SULFATE HFA 108 (90 BASE) MCG/ACT IN AERS: 1.0000 | INHALATION_SPRAY | Freq: Four times a day (QID) | RESPIRATORY_TRACT | 2 refills | Status: DC | PRN

## 2018-02-06 MED ORDER — BUDESONIDE-FORMOTEROL FUMARATE 80-4.5 MCG/ACT IN AERO: 2.0000 | INHALATION_SPRAY | Freq: Two times a day (BID) | RESPIRATORY_TRACT | 3 refills | Status: DC

## 2018-02-06 NOTE — Assessment & Plan Note (Signed)
Uncontrolled, patient not currently satisfied with level of sleep quality. Has not had desired effect even with self-increase of Seroquel to 200 mg daily.  Combined psychophysiologic insomnia and primary insomnia.  Plan: 1. Encourage patient to consider 5-7 day drug holiday.  Reluctant as she states she doesn't sleep at all without Seroquel.  Consider dose reduction to 50 mg on those days if needed.  Then, resume with regular dose. 2. INCREASE seroquel to 200 mg daily at bedtime. 3. Consider medication change in future.  May consider hydroxyzine for sleep/anxiety prn. 4. Followup 6 weeks - 3 months.

## 2018-02-06 NOTE — Assessment & Plan Note (Signed)
Uncontrolled hypertension.  BP goal < 130/80.  Pt is not working on lifestyle modifications.  Taking medications tolerating well without side effects. Complications: IIH, Obesity, headache, anxiety  Plan: 1. Continue taking VerapamilCR 120 mg once daily - INCREASE valsartan to 160 mg once daily - Consider adding HCTZ at next visit if remains elevated. 2. Obtain labs next visit in 3 mos  3. Encouraged heart healthy diet and increasing exercise to 30 minutes most days of the week. 4. Check BP 1-2 x per week at home, keep log, and bring to clinic at next appointment. 5. Follow up 3 months.

## 2018-02-06 NOTE — Assessment & Plan Note (Signed)
Stable, but symptoms prevent regular physical activity. Currently managed only with albuterol.  No adventitious breath sounds today, no current signs of active exacerbation. - No recent PFT  Plan: 1. Continue albuterol prn 2. STOP duoneb 3. START Symbicort 2 puffs twice daily.  Use during spring-fall months.  Re-evaluate winter use. 4. Recommend flu vaccine, educated patient on benefits even if low vaccine efficacy. 5. Followup 3-6 months prn.

## 2018-02-06 NOTE — Patient Instructions (Addendum)
Michele Newton,   Thank you for coming in to clinic today.  1. Increase Valsartan to 160 mg once daily for blood pressure  2. STOP DuoNeb - START Symbicort 2 puffs twice daily.  May stop once weather gets cooler as long as symptoms stay controlled.  3. Continue all other medications without changes.  Please schedule a follow-up appointment with Cassell Smiles, AGNP. Return in about 3 months (around 05/09/2018) for hypertension and in about 6 weeks if needed for anxiety.  If you have any other questions or concerns, please feel free to call the clinic or send a message through [place]. You may also schedule an earlier appointment if necessary.  You will receive a survey after today's visit either digitally by e-mail or paper by [employer]. Your experiences and feedback matter to [place].  Please respond so we know how we are doing as we provide care for you.   Cassell Smiles, DNP, AGNP-BC Adult Gerontology Nurse Practitioner [place]

## 2018-02-06 NOTE — Progress Notes (Signed)
Subjective:    Patient ID: Michele Newton, female    DOB: 1955-05-29, 62 y.o.   MRN: [id number]  Michele Newton is a 62 y.o. female presenting on 02/06/2018 for Hypertension (still elevated 150/90's )   HPI Hypertension  - She is checking BP at home or outside of clinic.  Readings SBP 150-170 - Current medications: verapamil CR 120 mg, valsartan 80 mg once daily tolerating well without side effects - She is symptomatic with headache, but pt has complex headache syndrome and history of IIH. - Pt denies lightheadedness, dizziness, changes in vision, chest tightness/pressure, palpitations, leg swelling, sudden loss of speech or loss of consciousness. - She  reports no regular exercise routine.  States is limited in exercise by poor breathing in hot weather. - Her diet is moderate in salt, moderate in fat, and moderate in carbohydrates.   Headaches Still having bad headaches.  "Like a migraine" lied down for 1 hour, then went back to bed.  Has had injections bilateral neck without relief as provided by neurology. Continues on verapamil which has helped moderate headache intensity to date.  Continues to have verapamil management by neurology.  Will not be able to stop this med 2/2 hypertension, may require dose increase in future.  Asthma Patient reports poor breathing in hot weather.  She was not able to get Duoneb nebs covered.  Is not currently on maintenance inhaler.  Is using albuterol intermittently, Singulair daily with some relief.    Insomnia Patient notes ability to fall asleep easily with Seroquel, but is not able to stay asleep.  Also finds that lower dose of Seroquel has caused more difficulty with sleep onset, so she self-increased dose to 200 mg.  Had been on 300 mg in past.  Also finds benefit from Seroquel on anxiety and depression.   Social History   Tobacco Use  . Smoking status: Never Smoker  . Smokeless tobacco: Never Used  Substance Use Topics  . Alcohol use: No  . Drug  use: No    Review of Systems Per HPI unless specifically indicated above     Objective:    BP (!) 142/75 (BP Location: Right Arm, Patient Position: Sitting, Cuff Size: Normal)   Pulse 82   Temp 98.4 F (36.9 C) (Oral)   Ht _0  (1.676 m)   Wt 195 lb (88.5 kg)   BMI 31.47 kg/m   Wt Readings from Last 3 Encounters:  02/06/18 195 lb (88.5 kg)  11/07/17 191 lb 9.6 oz (86.9 kg)  10/09/17 196 lb 6.4 oz (89.1 kg)    Physical Exam  Constitutional: She is oriented to person, place, and time. She appears well-developed and well-nourished. No distress.  HENT:  Head: Normocephalic and atraumatic.  Neck: Normal range of motion. Neck supple. Carotid bruit is not present.  Cardiovascular: Normal rate, regular rhythm, S1 normal, S2 normal, normal heart sounds and intact distal pulses.  Pulmonary/Chest: Effort normal and breath sounds normal. No respiratory distress.  Abdominal: Soft. Bowel sounds are normal. She exhibits no distension. There is no hepatosplenomegaly. There is no tenderness. No hernia.  Musculoskeletal: She exhibits no edema (pedal).  Neurological: She is alert and oriented to person, place, and time. She has normal strength. She displays tremor (mild head shaking/tremor). No cranial nerve deficit or sensory deficit. She displays a negative Romberg sign. GCS eye subscore is 4. GCS verbal subscore is 5. GCS motor subscore is 6.  Skin: Skin is warm and dry. Capillary refill takes less than  2 seconds.  Psychiatric: She has a normal mood and affect. Her behavior is normal. Judgment and thought content normal.  Vitals reviewed.   Results for orders placed or performed in visit on 10/09/17  COMPLETE METABOLIC PANEL WITH GFR  Result Value Ref Range   Glucose, Bld 87 65 - 99 mg/dL   BUN 13 7 - 25 mg/dL   Creat 0.90 0.50 - 0.99 mg/dL   GFR, Est Non African American 69 > OR = 60 mL/min/1.62m   GFR, Est African American 79 > OR = 60 mL/min/1.773m  BUN/Creatinine Ratio NOT  APPLICABLE 6 - 22 (calc)   Sodium 139 135 - 146 mmol/L   Potassium 4.4 3.5 - 5.3 mmol/L   Chloride 104 98 - 110 mmol/L   CO2 24 20 - 32 mmol/L   Calcium 9.7 8.6 - 10.4 mg/dL   Total Protein 6.7 6.1 - 8.1 g/dL   Albumin 4.4 3.6 - 5.1 g/dL   Globulin 2.3 1.9 - 3.7 g/dL (calc)   AG Ratio 1.9 1.0 - 2.5 (calc)   Total Bilirubin 0.4 0.2 - 1.2 mg/dL   Alkaline phosphatase (APISO) 95 33 - 130 U/L   AST 19 10 - 35 U/L   ALT 26 6 - 29 U/L  Sed Rate (ESR)  Result Value Ref Range   Sed Rate 6 0 - 30 mm/h  C-reactive protein  Result Value Ref Range   CRP 7.8 <8.0 mg/L      Assessment & Plan:   Problem List Items Addressed This Visit      Cardiovascular and Mediastinum   Essential hypertension    Uncontrolled hypertension.  BP goal < 130/80.  Pt is not working on lifestyle modifications.  Taking medications tolerating well without side effects. Complications: IIH, Obesity, headache, anxiety  Plan: 1. Continue taking VerapamilCR 120 mg once daily - INCREASE valsartan to 160 mg once daily - Consider adding HCTZ at next visit if remains elevated. 2. Obtain labs next visit in 3 mos  3. Encouraged heart healthy diet and increasing exercise to 30 minutes most days of the week. 4. Check BP 1-2 x per week at home, keep log, and bring to clinic at next appointment. 5. Follow up 3 months.        Relevant Medications   valsartan (DIOVAN) 160 MG tablet     Respiratory   Mild intermittent asthma without complication    Stable, but symptoms prevent regular physical activity. Currently managed only with albuterol.  No adventitious breath sounds today, no current signs of active exacerbation. - No recent PFT  Plan: 1. Continue albuterol prn 2. STOP duoneb 3. START Symbicort 2 puffs twice daily.  Use during spring-fall months.  Re-evaluate winter use. 4. Recommend flu vaccine, educated patient on benefits even if low vaccine efficacy. 5. Followup 3-6 months prn.      Relevant Medications    montelukast (SINGULAIR) 10 MG tablet   budesonide-formoterol (SYMBICORT) 80-4.5 MCG/ACT inhaler   albuterol (PROAIR HFA) 108 (90 Base) MCG/ACT inhaler     Other   Other insomnia - Primary    Uncontrolled, patient not currently satisfied with level of sleep quality. Has not had desired effect even with self-increase of Seroquel to 200 mg daily.  Combined psychophysiologic insomnia and primary insomnia.  Plan: 1. Encourage patient to consider 5-7 day drug holiday.  Reluctant as she states she doesn't sleep at all without Seroquel.  Consider dose reduction to 50 mg on those days if needed.  Then,  resume with regular dose. 2. INCREASE seroquel to 200 mg daily at bedtime. 3. Consider medication change in future.  May consider hydroxyzine for sleep/anxiety prn. 4. Followup 6 weeks - 3 months.      Relevant Medications   QUEtiapine (SEROQUEL) 200 MG tablet    Other Visit Diagnoses    Need for immunization against influenza       Relevant Orders   Flu Vaccine QUAD 6+ mos PF IM (Fluarix Quad PF) (Completed)      Meds ordered this encounter  Medications  . QUEtiapine (SEROQUEL) 200 MG tablet    Sig: Take 1 tablet (200 mg total) by mouth at bedtime.    Dispense:  90 tablet    Refill:  1    Order Specific Question:   Supervising Provider    Answer:   Olin Hauser [[id number]]  . valsartan (DIOVAN) 160 MG tablet    Sig: Take 1 tablet (160 mg total) by mouth daily.    Dispense:  90 tablet    Refill:  1    Order Specific Question:   Supervising Provider    Answer:   Olin Hauser [[id number]]  . montelukast (SINGULAIR) 10 MG tablet    Sig: TK 1 T PO HS    Dispense:  90 tablet    Refill:  3    Order Specific Question:   Supervising Provider    Answer:   Olin Hauser [[id number]]  . budesonide-formoterol (SYMBICORT) 80-4.5 MCG/ACT inhaler    Sig: Inhale 2 puffs into the lungs 2 (two) times daily.    Dispense:  1 Inhaler    Refill:  3    Order Specific Question:    Supervising Provider    Answer:   Olin Hauser [[id number]]  . albuterol (PROAIR HFA) 108 (90 Base) MCG/ACT inhaler    Sig: Inhale 1-2 puffs into the lungs every 6 (six) hours as needed for wheezing or shortness of breath.    Dispense:  1 Inhaler    Refill:  2    Refill when patient requests refill    Order Specific Question:   Supervising Provider    Answer:   Olin Hauser [[id number]]   Follow up plan: Return in about 3 months (around 05/09/2018) for hypertension and in about 6 weeks if needed for anxiety.  Michele Smiles, DNP, AGPCNP-BC Adult Gerontology Primary Care Nurse Practitioner [facility] 02/06/2018, 11:07 AM

## 2018-02-11 ENCOUNTER — Encounter: Payer: Self-pay | Admitting: Nurse Practitioner

## 2018-02-18 ENCOUNTER — Other Ambulatory Visit: Payer: Self-pay | Admitting: Nurse Practitioner

## 2018-02-18 DIAGNOSIS — J4520 Mild intermittent asthma, uncomplicated: Secondary | ICD-10-CM

## 2018-02-18 MED ORDER — FLUTICASONE FUROATE-VILANTEROL 100-25 MCG/INH IN AEPB: 1.0000 | INHALATION_SPRAY | Freq: Every day | RESPIRATORY_TRACT | 2 refills | Status: DC

## 2018-02-26 DIAGNOSIS — M25552 Pain in left hip: Secondary | ICD-10-CM | POA: Diagnosis not present

## 2018-02-26 DIAGNOSIS — G8929 Other chronic pain: Secondary | ICD-10-CM | POA: Diagnosis not present

## 2018-02-26 DIAGNOSIS — M7062 Trochanteric bursitis, left hip: Secondary | ICD-10-CM | POA: Diagnosis not present

## 2018-02-26 DIAGNOSIS — M1612 Unilateral primary osteoarthritis, left hip: Secondary | ICD-10-CM | POA: Diagnosis not present

## 2018-03-04 ENCOUNTER — Telehealth: Payer: Self-pay

## 2018-03-04 ENCOUNTER — Other Ambulatory Visit: Payer: Self-pay | Admitting: Internal Medicine

## 2018-03-04 DIAGNOSIS — G8929 Other chronic pain: Secondary | ICD-10-CM

## 2018-03-04 DIAGNOSIS — M25552 Pain in left hip: Principal | ICD-10-CM

## 2018-03-04 NOTE — Telephone Encounter (Signed)
Attempted to contact the pt to notify her that Symbicort is not a covered inhaler with her insurance. Lauren sent a prescription for Breo Inhaler since it is a preferred inhaler for your insurance.

## 2018-03-20 ENCOUNTER — Ambulatory Visit: Payer: Medicare Other

## 2018-04-08 ENCOUNTER — Ambulatory Visit
Admission: RE | Admit: 2018-04-08 | Discharge: 2018-04-08 | Disposition: A | Discharge status: Home / Self Care | Payer: Medicare Other | Source: Ambulatory Visit | Attending: Internal Medicine | Admitting: Internal Medicine

## 2018-04-08 DIAGNOSIS — G8929 Other chronic pain: Secondary | ICD-10-CM | POA: Diagnosis not present

## 2018-04-08 DIAGNOSIS — M1612 Unilateral primary osteoarthritis, left hip: Secondary | ICD-10-CM | POA: Diagnosis not present

## 2018-04-08 DIAGNOSIS — M25552 Pain in left hip: Secondary | ICD-10-CM | POA: Diagnosis not present

## 2018-05-12 ENCOUNTER — Other Ambulatory Visit: Payer: Self-pay

## 2018-05-12 ENCOUNTER — Encounter: Payer: Self-pay | Admitting: Nurse Practitioner

## 2018-05-12 ENCOUNTER — Ambulatory Visit (INDEPENDENT_AMBULATORY_CARE_PROVIDER_SITE_OTHER): Payer: Medicare Other | Admitting: Nurse Practitioner

## 2018-05-12 VITALS — BP 146/85 | HR 86 | Temp 98.1°F | Ht 66.0 in | Wt 199.6 lb

## 2018-05-12 DIAGNOSIS — I10 Essential (primary) hypertension: Secondary | ICD-10-CM | POA: Diagnosis not present

## 2018-05-12 DIAGNOSIS — F331 Major depressive disorder, recurrent, moderate: Secondary | ICD-10-CM

## 2018-05-12 MED ORDER — HYDROCHLOROTHIAZIDE 12.5 MG PO TABS: 12.5000 mg | ORAL_TABLET | Freq: Every day | ORAL | 3 refills | Status: DC

## 2018-05-12 MED ORDER — ARIPIPRAZOLE 2 MG PO TABS: 2.0000 mg | ORAL_TABLET | Freq: Every day | ORAL | 1 refills | Status: DC

## 2018-05-12 NOTE — Progress Notes (Signed)
Subjective:    Patient ID: Michele Newton, female    DOB: March 06, 1956, 63 y.o.   MRN: [id number]  Michele Newton is a 63 y.o. female presenting on 05/12/2018 for Hypertension   HPI Hypertension - She is checking BP at home or outside of clinic.  Readings 140-150/90 - Current medications: Valsartan 160 mg, tolerating well without side effects.  She stopped verapamil as directed by neurology interval visit.  Has not continued having good control. - She is not currently symptomatic. - Pt denies headache, lightheadedness, dizziness, changes in vision, chest tightness/pressure, palpitations, leg swelling, sudden loss of speech or loss of consciousness. - She  reports no regular exercise routine.  Limited by LEFT hip pain and instability.  Is awaiting orthopedic referral for severe arthritis. - Her diet is moderate in salt, moderate in fat, and moderate in carbohydrates.   Asthma Is using albuterol 2 times per week and continues Singulair.  Has no regular problems with shortness of breath.  Has not been able to get a maintenance inhaler due to cost even with insurance.  Anxiety and Depression Patient is not having very good control currently with paxil and seroquel.  Anxiety is better than depression.  Feels very depressed currently.   Has little interest in doing anything at this point.   Doesn't want to cook/clean.  "Feels blah."  Sits in quiet frequently doing nothing.  Depression screen [facility] 2/9 05/12/2018 02/06/2018 10/09/2017 05/22/2017 07/02/2016  Decreased Interest 3 3 0 3 0  Down, Depressed, Hopeless 3 3 3 3  0  PHQ - 2 Score 6 6 3 6  0  Altered sleeping 3 3 0 3 -  Tired, decreased energy 3 3 3 3  -  Change in appetite 0 0 2 0 -  Feeling bad or failure about yourself  0 0 0 0 -  Trouble concentrating 0 2 0 1 -  Moving slowly or fidgety/restless 0 0 0 2 -  Suicidal thoughts 0 0 0 0 -  PHQ-9 Score 12 14 8 15  -  Difficult doing work/chores Very difficult Very difficult Very difficult Somewhat  difficult -    GAD 7 : Generalized Anxiety Score 05/12/2018 10/01/2015  Nervous, Anxious, on Edge 3 3  Control/stop worrying 3 2  Worry too much - different things 1 2  Trouble relaxing 3 2  Restless 0 0  Easily annoyed or irritable 3 3  Afraid - awful might happen 0 1  Total GAD 7 Score 13 13  Anxiety Difficulty Not difficult at all Somewhat difficult   Social History   Tobacco Use  . Smoking status: Never Smoker  . Smokeless tobacco: Never Used  Substance Use Topics  . Alcohol use: No  . Drug use: No    Review of Systems Per HPI unless specifically indicated above     Objective:    BP (!) 146/85 (BP Location: Right Arm, Patient Position: Sitting, Cuff Size: Normal)   Pulse 86   Temp 98.1 F (36.7 C) (Oral)   Ht 5' 6"  (1.676 m)   Wt 199 lb 9.6 oz (90.5 kg)   BMI 32.22 kg/m    Wt Readings from Last 3 Encounters:  05/12/18 199 lb 9.6 oz (90.5 kg)  02/06/18 195 lb (88.5 kg)  11/07/17 191 lb 9.6 oz (86.9 kg)    Physical Exam Vitals signs reviewed.  Constitutional:      General: She is not in acute distress.    Appearance: She is well-developed.  HENT:  Head: Normocephalic and atraumatic.  Cardiovascular:     Rate and Rhythm: Normal rate and regular rhythm.     Pulses:          Radial pulses are 2+ on the right side and 2+ on the left side.       Posterior tibial pulses are 1+ on the right side and 1+ on the left side.     Heart sounds: Normal heart sounds, S1 normal and S2 normal.  Pulmonary:     Effort: Pulmonary effort is normal. No respiratory distress.     Breath sounds: Normal breath sounds and air entry.  Musculoskeletal:     Right lower leg: No edema.     Left lower leg: No edema.  Skin:    General: Skin is warm and dry.     Capillary Refill: Capillary refill takes less than 2 seconds.  Neurological:     General: No focal deficit present.     Mental Status: She is alert and oriented to person, place, and time. Mental status is at baseline.    Psychiatric:        Attention and Perception: Attention normal.        Mood and Affect: Mood and affect normal.        Behavior: Behavior normal. Behavior is cooperative.        Thought Content: Thought content normal.        Judgment: Judgment normal.     Results for orders placed or performed in visit on 10/09/17  COMPLETE METABOLIC PANEL WITH GFR  Result Value Ref Range   Glucose, Bld 87 65 - 99 mg/dL   BUN 13 7 - 25 mg/dL   Creat 0.90 0.50 - 0.99 mg/dL   GFR, Est Non African American 69 > OR = 60 mL/min/1.65m   GFR, Est African American 79 > OR = 60 mL/min/1.719m  BUN/Creatinine Ratio NOT APPLICABLE 6 - 22 (calc)   Sodium 139 135 - 146 mmol/L   Potassium 4.4 3.5 - 5.3 mmol/L   Chloride 104 98 - 110 mmol/L   CO2 24 20 - 32 mmol/L   Calcium 9.7 8.6 - 10.4 mg/dL   Total Protein 6.7 6.1 - 8.1 g/dL   Albumin 4.4 3.6 - 5.1 g/dL   Globulin 2.3 1.9 - 3.7 g/dL (calc)   AG Ratio 1.9 1.0 - 2.5 (calc)   Total Bilirubin 0.4 0.2 - 1.2 mg/dL   Alkaline phosphatase (APISO) 95 33 - 130 U/L   AST 19 10 - 35 U/L   ALT 26 6 - 29 U/L  Sed Rate (ESR)  Result Value Ref Range   Sed Rate 6 0 - 30 mm/h  C-reactive protein  Result Value Ref Range   CRP 7.8 <8.0 mg/L      Assessment & Plan:   Problem List Items Addressed This Visit      Cardiovascular and Mediastinum   Essential hypertension - Primary Uncontrolled hypertension with stopping verapamil.  Neurology stopped this medication as it was not helping patient headaches, but it was contributing to normotension.  BP goal < 130/80.  Pt is not working on lifestyle modifications.  Taking medications tolerating well without side effects. Complications: IIH  Plan: 1. Continue taking Valsartan 160 mg once daily - START hydrochlorothiazide 12.5 mg once daily.  If not effective will resume CCB for BP management. 2. Obtain labs next visit  3. Encouraged heart healthy diet and increasing exercise to 30 minutes most days of the week.  4.  Check BP 1-2 x per week at home, keep log, and bring to clinic at next appointment. 5. Follow up 2 months.     Relevant Medications   hydrochlorothiazide (HYDRODIURIL) 12.5 MG tablet    Other Visit Diagnoses    Moderate episode of recurrent major depressive disorder ([place])     Currently uncontrolled with moods almost always down, anhedonia.    Plan: 1. Recommend referral psychiatry and therapy. Patient currently refuses psychiatry, declines therapy. 2. Continue Seroquel and Paxil 3. START abilify  2 mg once daily.  Cautioned patient about serotonin syndrome with multiple medications modulating serotonin.  Verbalizes understanding.  Call clinic prn if new symptoms/side effects. 4. Follow-up 2 months.   Relevant Medications   ARIPiprazole (ABILIFY) 2 MG tablet      Meds ordered this encounter  Medications  . hydrochlorothiazide (HYDRODIURIL) 12.5 MG tablet    Sig: Take 1 tablet (12.5 mg total) by mouth daily.    Dispense:  90 tablet    Refill:  3    Order Specific Question:   Supervising Provider    Answer:   Olin Hauser [[id number]]  . ARIPiprazole (ABILIFY) 2 MG tablet    Sig: Take 1 tablet (2 mg total) by mouth daily.    Dispense:  30 tablet    Refill:  1    Order Specific Question:   Supervising Provider    Answer:   Olin Hauser [[id number]]    Follow up plan: Return in about 2 months (around 07/11/2018) for anxiety, depression.  Cassell Smiles, DNP, AGPCNP-BC Adult Gerontology Primary Care Nurse Practitioner [facility] 05/12/2018, 1:30 PM

## 2018-05-12 NOTE — Patient Instructions (Addendum)
Shoshannah Faubert,   Thank you for coming in to clinic today.  1. Continue your Paxil 40 mg once daily - Continue Seroquel - In about 7 days START Abilify 2 mg once daily   2. NOW: START new hydrochlorothiazide 12.5 mg once daily  - take in morning. - Continue Valsartan 160 mg once daily.  Please schedule a follow-up appointment with Cassell Smiles, AGNP. Return in about 2 months (around 07/11/2018) for anxiety, depression.  If you have any other questions or concerns, please feel free to call the clinic or send a message through [place]. You may also schedule an earlier appointment if necessary.  You will receive a survey after today's visit either digitally by e-mail or paper by [employer]. Your experiences and feedback matter to [place].  Please respond so we know how we are doing as we provide care for you.   Cassell Smiles, DNP, AGNP-BC Adult Gerontology Nurse Practitioner [place]

## 2018-05-13 DIAGNOSIS — M1612 Unilateral primary osteoarthritis, left hip: Secondary | ICD-10-CM | POA: Diagnosis not present

## 2018-05-15 ENCOUNTER — Encounter: Payer: Self-pay | Admitting: Nurse Practitioner

## 2018-05-15 DIAGNOSIS — M1612 Unilateral primary osteoarthritis, left hip: Secondary | ICD-10-CM | POA: Diagnosis not present

## 2018-05-15 DIAGNOSIS — M25552 Pain in left hip: Secondary | ICD-10-CM | POA: Diagnosis not present

## 2018-06-19 ENCOUNTER — Ambulatory Visit
Admission: RE | Admit: 2018-06-19 | Discharge: 2018-06-19 | Disposition: A | Discharge status: Home / Self Care | Payer: Medicare Other | Source: Ambulatory Visit | Attending: Nurse Practitioner | Admitting: Nurse Practitioner

## 2018-06-19 ENCOUNTER — Ambulatory Visit (INDEPENDENT_AMBULATORY_CARE_PROVIDER_SITE_OTHER): Payer: Medicare Other | Admitting: Nurse Practitioner

## 2018-06-19 ENCOUNTER — Other Ambulatory Visit: Payer: Self-pay

## 2018-06-19 ENCOUNTER — Encounter: Payer: Self-pay | Admitting: Nurse Practitioner

## 2018-06-19 ENCOUNTER — Other Ambulatory Visit: Payer: Self-pay | Admitting: Nurse Practitioner

## 2018-06-19 VITALS — BP 135/80 | HR 85 | Ht 66.0 in | Wt 200.0 lb

## 2018-06-19 DIAGNOSIS — K219 Gastro-esophageal reflux disease without esophagitis: Secondary | ICD-10-CM

## 2018-06-19 DIAGNOSIS — G8929 Other chronic pain: Secondary | ICD-10-CM | POA: Diagnosis not present

## 2018-06-19 DIAGNOSIS — M545 Low back pain, unspecified: Secondary | ICD-10-CM

## 2018-06-19 MED ORDER — DICLOFENAC SODIUM 75 MG PO TBEC: 75.0000 mg | DELAYED_RELEASE_TABLET | Freq: Two times a day (BID) | ORAL | 1 refills | Status: DC | PRN

## 2018-06-19 MED ORDER — PANTOPRAZOLE SODIUM 20 MG PO TBEC: 40.0000 mg | DELAYED_RELEASE_TABLET | Freq: Two times a day (BID) | ORAL | 1 refills | Status: DC

## 2018-06-19 MED ORDER — CYCLOBENZAPRINE HCL 5 MG PO TABS: 5.0000 mg | ORAL_TABLET | Freq: Every evening | ORAL | 1 refills | Status: DC | PRN

## 2018-06-19 NOTE — Progress Notes (Signed)
Subjective:    Patient ID: Michele Newton, female    DOB: 26-Aug-1955, 63 y.o.   MRN: [id number]  Michele Newton is a 63 y.o. female presenting on 06/19/2018 for Back Pain (constant lower back pain x 3weeks almost at the top of the coccyx area. history of hip issues. The pt was recommended to get a hip replacement. Now shes concern they might be related.) and Gastroesophageal Reflux (x2 weeks, pt have history of GERD)   HPI Back Pain Low back pain new over last 3 weeks.  Patient has had past hip problems and was recommended to have hip replacement in past.  Has had xray guided injection with some relief.  Now does not feel that the pain in hip is as severe.  Continues to have coccyx pain with radiation across hips.   - Patient has ortho/sports med - Occasionally notes squeezing meds. - OTC patient is taking Aleve, Tylenol, ibuprofen - Heating pad helps some, but is not long-term fix.  GERD  Worsening over last 2 weeks. Has had GERD for many years.  - Patient is not currently on meds.  In past the only one that helps significantly. - Prevacid, Prilosec, Nexium have not helped in past.  Was also on Protonix in past.  - Occasionally has globus sensation. - Always has reflux and with occasional stomach contents into throat/mouth.  Social History   Tobacco Use  . Smoking status: Never Smoker  . Smokeless tobacco: Never Used  Substance Use Topics  . Alcohol use: No  . Drug use: No    Review of Systems Per HPI unless specifically indicated above     Objective:    BP 135/80 (BP Location: Right Arm, Patient Position: Sitting, Cuff Size: Normal)   Pulse 85   Ht 5' 6"  (1.676 m)   Wt 200 lb (90.7 kg)   BMI 32.28 kg/m   Wt Readings from Last 3 Encounters:  06/19/18 200 lb (90.7 kg)  05/12/18 199 lb 9.6 oz (90.5 kg)  02/06/18 195 lb (88.5 kg)    Physical Exam Vitals signs reviewed.  Constitutional:      General: She is not in acute distress.    Appearance: She is well-developed.    HENT:     Head: Normocephalic and atraumatic.  Cardiovascular:     Rate and Rhythm: Normal rate and regular rhythm.     Pulses:          Radial pulses are 2+ on the right side and 2+ on the left side.       Posterior tibial pulses are 1+ on the right side and 1+ on the left side.     Heart sounds: Normal heart sounds, S1 normal and S2 normal.  Pulmonary:     Effort: Pulmonary effort is normal. No respiratory distress.     Breath sounds: Normal breath sounds and air entry.  Abdominal:     General: Bowel sounds are normal. There is no distension.     Palpations: Abdomen is soft. There is no mass.     Tenderness: There is no abdominal tenderness. There is no right CVA tenderness, left CVA tenderness, guarding or rebound.     Hernia: No hernia is present.  Musculoskeletal:     Right lower leg: No edema.     Left lower leg: No edema.     Comments: Low Back Inspection: Normal appearance, Large body habitus, no spinal deformity, symmetrical. Palpation: No tenderness over spinous processes. Bilateral lumbar paraspinal muscles tender  and with hypertonicity/spasm up to level of T10. ROM: Limited AROM forward flex / back extension, rotation L/R with mild discomfort Special Testing: Seated SLR with reproduced localized midline back pain but negative for radicular pain. Standing facet load test negative. Strength: Bilateral hip flex/ext 5/5, knee flex/ext 5/5, ankle dorsiflex/plantarflex 5/5 Neurovascular: intact distal sensation to light touch.  Skin:    General: Skin is warm and dry.     Capillary Refill: Capillary refill takes less than 2 seconds.  Neurological:     Mental Status: She is alert and oriented to person, place, and time.  Psychiatric:        Attention and Perception: Attention normal.        Mood and Affect: Mood and affect normal.        Behavior: Behavior normal. Behavior is cooperative.        Thought Content: Thought content normal.        Judgment: Judgment normal.     Results for orders placed or performed in visit on 10/09/17  COMPLETE METABOLIC PANEL WITH GFR  Result Value Ref Range   Glucose, Bld 87 65 - 99 mg/dL   BUN 13 7 - 25 mg/dL   Creat 0.90 0.50 - 0.99 mg/dL   GFR, Est Non African American 69 > OR = 60 mL/min/1.58m   GFR, Est African American 79 > OR = 60 mL/min/1.764m  BUN/Creatinine Ratio NOT APPLICABLE 6 - 22 (calc)   Sodium 139 135 - 146 mmol/L   Potassium 4.4 3.5 - 5.3 mmol/L   Chloride 104 98 - 110 mmol/L   CO2 24 20 - 32 mmol/L   Calcium 9.7 8.6 - 10.4 mg/dL   Total Protein 6.7 6.1 - 8.1 g/dL   Albumin 4.4 3.6 - 5.1 g/dL   Globulin 2.3 1.9 - 3.7 g/dL (calc)   AG Ratio 1.9 1.0 - 2.5 (calc)   Total Bilirubin 0.4 0.2 - 1.2 mg/dL   Alkaline phosphatase (APISO) 95 33 - 130 U/L   AST 19 10 - 35 U/L   ALT 26 6 - 29 U/L  Sed Rate (ESR)  Result Value Ref Range   Sed Rate 6 0 - 30 mm/h  C-reactive protein  Result Value Ref Range   CRP 7.8 <8.0 mg/L      Assessment & Plan:   Problem List Items Addressed This Visit      Digestive   GERD (gastroesophageal reflux disease) Currently poorly controlled over last 2 weeks on no medication. Patient with chronic gerd in past requiring Dexilant use for control.  No current alarm symptoms or bleeding.  Complicated by use of OTC NSAIDs over last 3 weeks.  Plan: 1. START pantoprazole 20 mg bid ac. Side effects discussed. Pt wants to continue med. 2. Avoid diet triggers. Reviewed need to seek care if globus sensation, difficulty swallowing, s/sx of GI bleed. 3. Follow up as needed and in 6 weeks with regular follow-up.   Relevant Medications   pantoprazole (PROTONIX) 20 MG tablet     Other   Chronic midline low back pain without sciatica - Primary Pain likely chronic and could be related to altered gait due to hip degeneration.  Patient concerned about spinal issue with subacute worsening of back pain.    Plan:  1. Treat with OTC acetaminophen prn.  Discussed alternate dosing and  max dosing with diclofenac. - may take diclofenac 75 mg one tab bid prn moderate pain.  Encouraged patient to consider skipping dose if not  needed to help protect stomach. 2. Apply heat and/or ice to affected area. 3. May also apply a muscle rub with lidocaine or lidocaine patch after heat or ice. 4. Take muscle relaxer cyclobenzaprine 5 mg at bedtime only as needed.  Cautioned drowsiness. 5. Follow up 2-4 weeks prn.  Encouraged patient to consider PT.  Currently declines.  May call us or orthopedics in future if desires PT.    Relevant Medications   traMADol (ULTRAM) 50 MG tablet   cyclobenzaprine (FLEXERIL) 5 MG tablet   diclofenac (VOLTAREN) 75 MG EC tablet   Other Relevant Orders   DG Lumbar Spine Complete      Meds ordered this encounter  Medications  . pantoprazole (PROTONIX) 20 MG tablet    Sig: Take 2 tablets (40 mg total) by mouth 2 (two) times daily before a meal.    Dispense:  180 tablet    Refill:  1    Order Specific Question:   Supervising Provider    Answer:   Olin Hauser [[id number]]  . cyclobenzaprine (FLEXERIL) 5 MG tablet    Sig: Take 1 tablet (5 mg total) by mouth at bedtime as needed for muscle spasms.    Dispense:  30 tablet    Refill:  1    Order Specific Question:   Supervising Provider    Answer:   Olin Hauser [[id number]]  . diclofenac (VOLTAREN) 75 MG EC tablet    Sig: Take 1 tablet (75 mg total) by mouth 2 (two) times daily as needed for moderate pain.    Dispense:  60 tablet    Refill:  1    Order Specific Question:   Supervising Provider    Answer:   Olin Hauser [[id number]]   Follow up plan: Return if symptoms worsen or fail to improve.  Cassell Smiles, DNP, AGPCNP-BC Adult Gerontology Primary Care Nurse Practitioner [facility] 06/19/2018, 11:07 AM

## 2018-06-19 NOTE — Patient Instructions (Addendum)
Michele Newton,   Thank you for coming in to clinic today.  1. START Flexeril 5 mg once daily as needed at bedtime.  2. START diclofenac tablet 75 mg one tablet twice daily as needed for moderate pain.  3. START pantoprazole 20 mg twice daily about 30 minutes before meals.  Please schedule a follow-up appointment with Cassell Smiles, AGNP. Return if symptoms worsen or fail to improve.  If you have any other questions or concerns, please feel free to call the clinic or send a message through [facility]. You may also schedule an earlier appointment if necessary.  You will receive a survey after today's visit either digitally by e-mail or paper by [employer]. Your experiences and feedback matter to [place].  Please respond so we know how we are doing as we provide care for you.   Cassell Smiles, DNP, AGNP-BC Adult Gerontology Nurse Practitioner [place]

## 2018-06-23 ENCOUNTER — Other Ambulatory Visit: Payer: Self-pay | Admitting: Nurse Practitioner

## 2018-07-01 DIAGNOSIS — M169 Osteoarthritis of hip, unspecified: Secondary | ICD-10-CM | POA: Diagnosis not present

## 2018-07-04 ENCOUNTER — Other Ambulatory Visit: Payer: Self-pay | Admitting: Nurse Practitioner

## 2018-07-04 DIAGNOSIS — F419 Anxiety disorder, unspecified: Principal | ICD-10-CM

## 2018-07-04 DIAGNOSIS — F329 Major depressive disorder, single episode, unspecified: Secondary | ICD-10-CM

## 2018-07-10 ENCOUNTER — Ambulatory Visit: Payer: Medicare Other | Admitting: Nurse Practitioner

## 2018-07-10 DIAGNOSIS — M1611 Unilateral primary osteoarthritis, right hip: Secondary | ICD-10-CM | POA: Diagnosis not present

## 2018-07-10 DIAGNOSIS — M1612 Unilateral primary osteoarthritis, left hip: Secondary | ICD-10-CM | POA: Diagnosis not present

## 2018-07-14 ENCOUNTER — Other Ambulatory Visit: Payer: Self-pay | Admitting: Nurse Practitioner

## 2018-07-14 DIAGNOSIS — F331 Major depressive disorder, recurrent, moderate: Secondary | ICD-10-CM

## 2018-07-22 ENCOUNTER — Other Ambulatory Visit: Payer: Self-pay

## 2018-07-22 ENCOUNTER — Encounter
Admission: RE | Admit: 2018-07-22 | Discharge: 2018-07-22 | Disposition: A | Discharge status: Home / Self Care | Payer: Medicare Other | Source: Ambulatory Visit | Attending: Orthopedic Surgery | Admitting: Orthopedic Surgery

## 2018-07-22 DIAGNOSIS — Z01812 Encounter for preprocedural laboratory examination: Secondary | ICD-10-CM | POA: Insufficient documentation

## 2018-07-22 LAB — PROTIME-INR
INR: 0.9 (ref 0.8–1.2)
Prothrombin Time: 12.5 seconds (ref 11.4–15.2)

## 2018-07-22 LAB — CBC
HCT: 44.7 % (ref 36.0–46.0)
Hemoglobin: 14.5 g/dL (ref 12.0–15.0)
MCH: 28.6 pg (ref 26.0–34.0)
MCHC: 32.4 g/dL (ref 30.0–36.0)
MCV: 88.2 fL (ref 80.0–100.0)
PLATELETS: 459 10*3/uL — AB (ref 150–400)
RBC: 5.07 MIL/uL (ref 3.87–5.11)
RDW: 13.2 % (ref 11.5–15.5)
WBC: 9.9 10*3/uL (ref 4.0–10.5)
nRBC: 0 % (ref 0.0–0.2)

## 2018-07-22 LAB — BASIC METABOLIC PANEL
Anion gap: 11 (ref 5–15)
BUN: 18 mg/dL (ref 8–23)
CO2: 24 mmol/L (ref 22–32)
Calcium: 9.6 mg/dL (ref 8.9–10.3)
Chloride: 105 mmol/L (ref 98–111)
Creatinine, Ser: 0.82 mg/dL (ref 0.44–1.00)
GFR calc Af Amer: >60 mL/min (ref >60)
Glucose, Bld: 137 mg/dL — ABNORMAL HIGH (ref 70–99)
Potassium: 3.7 mmol/L (ref 3.5–5.1)
Sodium: 140 mmol/L (ref 135–145)

## 2018-07-22 LAB — APTT: aPTT: 31 seconds (ref 24–36)

## 2018-07-22 LAB — SURGICAL PCR SCREEN
MRSA, PCR: NEGATIVE
Staphylococcus aureus: NEGATIVE

## 2018-07-22 LAB — SEDIMENTATION RATE: Sed Rate: 10 mm/hr (ref 0–30)

## 2018-07-22 NOTE — Patient Instructions (Addendum)
Your procedure is scheduled on: Tuesday 08/04/18.  Report to DAY SURGERY DEPARTMENT LOCATED ON 2ND FLOOR MEDICAL MALL ENTRANCE. To find out your arrival time please call [phone] between 1PM - 3PM on Monday 08/03/18.   **IF YOUR SURGERY IS POSTPONED YOUR SURGEON'S OFFICE WILL CALL YOU WITH A NEW SURGERY DATE AND INSTRUCTIONS ABOUT WHEN TO CALL TO FIND OUT YOUR ARRIVAL TIME.  Remember: Instructions that are not followed completely may result in serious medical risk, up to and including death, or upon the discretion of your surgeon and anesthesiologist your surgery may need to be rescheduled.      _X__ 1. Do not eat food after midnight the night before your procedure.                 No gum chewing or hard candies. You may drink clear liquids up to 2 hours                 before you are scheduled to arrive for your surgery- DO NOT drink clear                 liquids within 2 hours of the start of your surgery.                 Clear Liquids include:  water, apple juice without pulp, clear carbohydrate                 drink such as Clearfast or Gatorade, Black Coffee or Tea (Do not add                 milk or creamer to coffee or tea).   __X__2.  On the morning of surgery brush your teeth with toothpaste and water, you may rinse your mouth with mouthwash if you wish.  Do not swallow any toothpaste or mouthwash.      _X__ 3.  No Alcohol for 24 hours before or after surgery.    _X__ 4.  Do Not Smoke or use e-cigarettes For 24 Hours Prior to Your Surgery.                 Do not use any chewable tobacco products for at least 6 hours prior to                 surgery.   __X__6.  Notify your doctor if there is any change in your medical condition      (cold, fever, infections).     Do not wear jewelry, make-up, hairpins, clips or nail polish. Do not wear lotions, powders, or perfumes.  Do not shave 48 hours prior to surgery. Men may shave face and neck. Do not bring valuables to the  hospital.     [facility] is not responsible for any belongings or valuables.   Contacts, dentures/partials or body piercings may not be worn into surgery. Bring a case for your contacts, glasses or hearing aids, a denture cup will be supplied.   Leave your suitcase in the car. After surgery it may be brought to your room.   For patients admitted to the hospital, discharge time is determined by your treatment team.     Please read over the following fact sheets that you were given:   MRSA Information   __X__ Take these medicines the morning of surgery with A SIP OF WATER:      1. fluticasone (FLONASE) 50 MCG/ACT nasal spray  2. pantoprazole (PROTONIX) 20 MG tablet  3. PARoxetine (PAXIL) 40 MG tablet  4. HYDROcodone-acetaminophen (NORCO/VICODIN) 5-325 MG tablet if needed    __X__ Use CHG Soap as directed.   _ X___ Use inhalers on the day of surgery. Also bring the inhaler with you to the hospital on the morning of surgery.   __X__ Stop Anti-inflammatories 7 days before surgery such as Advil, Ibuprofen, Motrin, BC or Goodies Powder, Naprosyn, Naproxen, Aleve, Aspirin, Meloxicam. May take Tylenol if needed for pain or discomfort.    __X__ Please do not start taking any new herbal supplements before your surgery.

## 2018-07-22 NOTE — Pre-Procedure Instructions (Signed)
Patient was unable to provide urine for UA and UC. Will return another day. Orders entered to be released.

## 2018-07-24 ENCOUNTER — Other Ambulatory Visit: Payer: Self-pay

## 2018-07-24 MED ORDER — PANTOPRAZOLE SODIUM 40 MG PO TBEC: 40.0000 mg | DELAYED_RELEASE_TABLET | Freq: Two times a day (BID) | ORAL | 3 refills | Status: DC

## 2018-07-28 ENCOUNTER — Other Ambulatory Visit: Payer: Self-pay | Admitting: Nurse Practitioner

## 2018-07-28 DIAGNOSIS — G4709 Other insomnia: Secondary | ICD-10-CM

## 2018-08-04 ENCOUNTER — Encounter: Admission: RE | Payer: Self-pay | Source: Home / Self Care

## 2018-08-04 ENCOUNTER — Inpatient Hospital Stay: Admission: RE | Admit: 2018-08-04 | Payer: Medicare Other | Source: Home / Self Care | Admitting: Orthopedic Surgery

## 2018-08-04 SURGERY — ARTHROPLASTY, HIP, TOTAL,POSTERIOR APPROACH
Anesthesia: Choice | Laterality: Left

## 2018-08-21 ENCOUNTER — Other Ambulatory Visit: Payer: Self-pay | Admitting: Nurse Practitioner

## 2018-08-21 DIAGNOSIS — F331 Major depressive disorder, recurrent, moderate: Secondary | ICD-10-CM

## 2018-08-21 DIAGNOSIS — M545 Low back pain, unspecified: Secondary | ICD-10-CM

## 2018-08-21 DIAGNOSIS — G8929 Other chronic pain: Secondary | ICD-10-CM

## 2018-08-25 ENCOUNTER — Other Ambulatory Visit: Payer: Self-pay | Admitting: Nurse Practitioner

## 2018-08-25 DIAGNOSIS — G4709 Other insomnia: Secondary | ICD-10-CM

## 2018-08-26 DIAGNOSIS — M545 Low back pain: Secondary | ICD-10-CM | POA: Diagnosis not present

## 2018-08-26 DIAGNOSIS — M1612 Unilateral primary osteoarthritis, left hip: Secondary | ICD-10-CM | POA: Insufficient documentation

## 2018-08-26 DIAGNOSIS — G8929 Other chronic pain: Secondary | ICD-10-CM | POA: Diagnosis not present

## 2018-09-29 ENCOUNTER — Inpatient Hospital Stay: Admit: 2018-09-29 | Payer: Medicare Other | Admitting: Orthopedic Surgery

## 2018-09-29 SURGERY — ARTHROPLASTY, HIP, TOTAL,POSTERIOR APPROACH
Anesthesia: Choice | Laterality: Left

## 2018-10-06 ENCOUNTER — Other Ambulatory Visit: Payer: Self-pay | Admitting: Nurse Practitioner

## 2018-10-06 DIAGNOSIS — G8929 Other chronic pain: Secondary | ICD-10-CM

## 2018-10-06 DIAGNOSIS — K219 Gastro-esophageal reflux disease without esophagitis: Secondary | ICD-10-CM

## 2018-11-05 ENCOUNTER — Other Ambulatory Visit: Payer: Self-pay | Admitting: Nurse Practitioner

## 2018-11-05 ENCOUNTER — Other Ambulatory Visit: Payer: Self-pay | Admitting: Family Medicine

## 2018-11-05 DIAGNOSIS — G4709 Other insomnia: Secondary | ICD-10-CM

## 2018-11-05 DIAGNOSIS — F331 Major depressive disorder, recurrent, moderate: Secondary | ICD-10-CM

## 2018-11-11 ENCOUNTER — Encounter
Admission: RE | Admit: 2018-11-11 | Discharge: 2018-11-11 | Disposition: A | Discharge status: Home / Self Care | Payer: Medicare Other | Source: Ambulatory Visit | Attending: Orthopedic Surgery | Admitting: Orthopedic Surgery

## 2018-11-11 ENCOUNTER — Encounter: Payer: Self-pay | Admitting: Nurse Practitioner

## 2018-11-11 ENCOUNTER — Other Ambulatory Visit: Payer: Self-pay

## 2018-11-11 DIAGNOSIS — Z01810 Encounter for preprocedural cardiovascular examination: Secondary | ICD-10-CM | POA: Diagnosis not present

## 2018-11-11 DIAGNOSIS — Z01818 Encounter for other preprocedural examination: Secondary | ICD-10-CM | POA: Insufficient documentation

## 2018-11-11 DIAGNOSIS — I493 Ventricular premature depolarization: Secondary | ICD-10-CM | POA: Diagnosis not present

## 2018-11-11 DIAGNOSIS — R9431 Abnormal electrocardiogram [ECG] [EKG]: Secondary | ICD-10-CM

## 2018-11-11 DIAGNOSIS — I459 Conduction disorder, unspecified: Secondary | ICD-10-CM

## 2018-11-11 HISTORY — DX: Gastro-esophageal reflux disease without esophagitis: K21.9

## 2018-11-11 HISTORY — DX: Unspecified asthma, uncomplicated: J45.909

## 2018-11-11 LAB — APTT: aPTT: 31 seconds (ref 24–36)

## 2018-11-11 LAB — SURGICAL PCR SCREEN
MRSA, PCR: NEGATIVE
Staphylococcus aureus: NEGATIVE

## 2018-11-11 LAB — TYPE AND SCREEN
ABO/RH(D): O POS
Antibody Screen: NEGATIVE

## 2018-11-11 LAB — CBC
HCT: 47.2 % — ABNORMAL HIGH (ref 36.0–46.0)
Hemoglobin: 15.2 g/dL — ABNORMAL HIGH (ref 12.0–15.0)
MCH: 28.4 pg (ref 26.0–34.0)
MCHC: 32.2 g/dL (ref 30.0–36.0)
MCV: 88.1 fL (ref 80.0–100.0)
Platelets: 451 10*3/uL — ABNORMAL HIGH (ref 150–400)
RBC: 5.36 MIL/uL — ABNORMAL HIGH (ref 3.87–5.11)
RDW: 13 % (ref 11.5–15.5)
WBC: 8.6 10*3/uL (ref 4.0–10.5)
nRBC: 0 % (ref 0.0–0.2)

## 2018-11-11 LAB — PROTIME-INR
INR: 1 (ref 0.8–1.2)
Prothrombin Time: 13 seconds (ref 11.4–15.2)

## 2018-11-11 LAB — URINALYSIS, ROUTINE W REFLEX MICROSCOPIC
Bilirubin Urine: NEGATIVE
Glucose, UA: NEGATIVE mg/dL
Hgb urine dipstick: NEGATIVE
Ketones, ur: NEGATIVE mg/dL
Leukocytes,Ua: NEGATIVE
Nitrite: NEGATIVE
Protein, ur: NEGATIVE mg/dL
Specific Gravity, Urine: 1.011 (ref 1.005–1.030)
pH: 5 (ref 5.0–8.0)

## 2018-11-11 LAB — BASIC METABOLIC PANEL
Anion gap: 10 (ref 5–15)
BUN: 12 mg/dL (ref 8–23)
CO2: 26 mmol/L (ref 22–32)
Calcium: 9.3 mg/dL (ref 8.9–10.3)
Chloride: 104 mmol/L (ref 98–111)
Creatinine, Ser: 0.81 mg/dL (ref 0.44–1.00)
GFR calc Af Amer: >60 mL/min (ref >60)
GFR calc non Af Amer: >60 mL/min (ref >60)
Glucose, Bld: 96 mg/dL (ref 70–99)
Potassium: 3.8 mmol/L (ref 3.5–5.1)
Sodium: 140 mmol/L (ref 135–145)

## 2018-11-11 NOTE — Pre-Procedure Instructions (Signed)
AS INSTRUCTED BY DR Amie Critchley, EKG/REQUEST FOR CLEARANCE CALLED AND FAXED TO DR [facility] OFFICE

## 2018-11-11 NOTE — Patient Instructions (Signed)
Your procedure is scheduled on:11/19/18 Report to Day Surgery.MEDICAL MALL SECOND FLOOR To find out your arrival time please call [phone] between 1PM - 3PM on 11/18/18.  Remember: Instructions that are not followed completely may result in serious medical risk,  up to and including death, or upon the discretion of your surgeon and anesthesiologist your  surgery may need to be rescheduled.     _X__ 1. Do not eat food after midnight the night before your procedure.                 No gum chewing or hard candies. You may drink clear liquids up to 2 hours                 before you are scheduled to arrive for your surgery- DO not drink clear                 liquids within 2 hours of the start of your surgery.                 Clear Liquids include:  water, apple juice without pulp, clear carbohydrate                 drink such as Clearfast of Gatorade, Black Coffee or Tea (Do not add                 anything to coffee or tea).  __X__2.  On the morning of surgery brush your teeth with toothpaste and water, you                may rinse your mouth with mouthwash if you wish.  Do not swallow any toothpaste of mouthwash.     _X__ 3.  No Alcohol for 24 hours before or after surgery.   _X__ 4.  Do Not Smoke or use e-cigarettes For 24 Hours Prior to Your Surgery.                 Do not use any chewable tobacco products for at least 6 hours prior to                 surgery.  ____  5.  Bring all medications with you on the day of surgery if instructed.   _X__  6.  Notify your doctor if there is any change in your medical condition      (cold, fever, infections).     Do not wear jewelry, make-up, hairpins, clips or nail polish. Do not wear lotions, powders, or perfumes. You may wear deodorant. Do not shave 48 hours prior to surgery. Men may shave face and neck. Do not bring valuables to the hospital.    [facility] is not responsible for any belongings or  valuables.  Contacts, dentures or bridgework may not be worn into surgery. Leave your suitcase in the car. After surgery it may be brought to your room. For patients admitted to the hospital, discharge time is determined by your treatment team.   Patients discharged the day of surgery will not be allowed to drive home.   Please read over the following fact sheets that you were given:   Surgical Site Infection Prevention / MRSA/ JOINT BOOKLET         _X__ Take these medicines the morning of surgery with A SIP OF WATER:    1. PANTOPRAZOLE  2.   3.   4.  5.  6.  ____ Fleet Enema (as directed)  __X__ Use CHG Soap as directed  __X__ Use inhalers on the day of surgery AND BRING  ____ Stop metformin 2 days prior to surgery    ____ Take 1/2 of usual insulin dose the night before surgery. No insulin the morning          of surgery.   ____ Stop Coumadin/Plavix/aspirin on  __X__ Stop Anti-inflammatories on  ALEVE 11/11/18   ____ Stop supplements until after surgery.    ____ Bring C-Pap to the hospital.

## 2018-11-12 LAB — URINE CULTURE: Culture: <10000 — AB

## 2018-11-12 NOTE — Addendum Note (Signed)
Addended by: Olin Hauser on: 11/12/2018 12:46 PM   Modules accepted: Orders

## 2018-11-16 ENCOUNTER — Other Ambulatory Visit
Admission: RE | Admit: 2018-11-16 | Discharge: 2018-11-16 | Disposition: A | Discharge status: Home / Self Care | Payer: Medicare Other | Source: Ambulatory Visit | Attending: Orthopedic Surgery | Admitting: Orthopedic Surgery

## 2018-11-16 ENCOUNTER — Other Ambulatory Visit: Payer: Self-pay

## 2018-11-16 ENCOUNTER — Ambulatory Visit (INDEPENDENT_AMBULATORY_CARE_PROVIDER_SITE_OTHER): Payer: Medicare Other | Admitting: Family Medicine

## 2018-11-16 ENCOUNTER — Encounter: Payer: Self-pay | Admitting: Family Medicine

## 2018-11-16 ENCOUNTER — Ambulatory Visit: Payer: Medicare Other | Admitting: Family Medicine

## 2018-11-16 VITALS — BP 111/73 | HR 78 | Temp 98.2°F | Resp 16 | Ht 66.0 in | Wt 199.0 lb

## 2018-11-16 DIAGNOSIS — M1612 Unilateral primary osteoarthritis, left hip: Secondary | ICD-10-CM

## 2018-11-16 DIAGNOSIS — M25552 Pain in left hip: Secondary | ICD-10-CM | POA: Diagnosis not present

## 2018-11-16 DIAGNOSIS — Z1159 Encounter for screening for other viral diseases: Secondary | ICD-10-CM | POA: Insufficient documentation

## 2018-11-16 DIAGNOSIS — Z01818 Encounter for other preprocedural examination: Secondary | ICD-10-CM | POA: Diagnosis not present

## 2018-11-16 LAB — SARS CORONAVIRUS 2 (TAT 6-24 HRS): SARS Coronavirus 2: NEGATIVE

## 2018-11-16 NOTE — Progress Notes (Signed)
Subjective:    Patient ID: Michele Newton, female    DOB: 07-Nov-1955, 63 y.o.   MRN: [id number]  Michele Newton is a 63 y.o. female presenting on 11/16/2018 for Pre-op Exam (need medical clearance), Abnormal ECG, and Arthritis (Left hip pain)  PCP is Cassell Smiles, AGPCNP-BC - I am currently covering during her maternity leave.  HPI   PRE-OPERATIVE MEDICAL CLEARANCE Anticipated upcoming surgery - Left Total Hip Arthroplasty, by Dr [name] (at [facility]) in approximately 3 days, 11/19/18 TBD  Patient is not regularly followed by Cardiology, but was identified to have an ABNORMAL EKG at Pre-OP testing, and now will be required to have Pre-Operative CARDIAC Clearance in near future, and they are here for MEDICAL clearance today.  Today patient reports chronic history of osteoarthritis left hip for long term. Initial surgical consultation with surgery for this back in January 2020 and it was delayed due to Coronavirus Pandemic. - She has good days and bad days with pain in hip. Uses cane as needed to help ambulate at times.  Regarding surgical and anesthesia history: - Known history of several major / minor surgeries in past, and has tolerated general anesthesia well without problem, complication or allergy. No family history of problem with anesthesia - Able to tolerate regular exercise up to >4 METs, walking up flight of stairs - No known history of cardiovascular disease. Never had MI or known CAD. - Abnormal EKG with PVCs and possible Right Ventricular conduction delay, without prior comparison EKG - Known Asthma with pulmonary disease. Seems to be adult onset Asthma. Never smoker. Controlled with Singulair and has rare use of Albuterol PRN. No history of COPD or OSA. - Denies exertional symptoms of chest pain or tightness, dyspnea, coughing, apnea, syncopal episodes, palpitations   Past Surgical History:  Procedure Laterality Date   BREAST SURGERY  2000   biopsy   NASAL SINUS  SURGERY  1998   TONSILLECTOMY AND ADENOIDECTOMY  1985   TUBAL LIGATION     TYMPANOSTOMY [place]   VENTRICULAR ATRIAL SHUNT Right 1995    Depression screen [facility] 2/9 11/16/2018 05/12/2018 02/06/2018  Decreased Interest 1 3 3   Down, Depressed, Hopeless 1 3 3   PHQ - 2 Score 2 6 6   Altered sleeping 3 3 3   Tired, decreased energy 3 3 3   Change in appetite 2 0 0  Feeling bad or failure about yourself  0 0 0  Trouble concentrating 2 0 2  Moving slowly or fidgety/restless 1 0 0  Suicidal thoughts 0 0 0  PHQ-9 Score 13 12 14   Difficult doing work/chores Somewhat difficult Very difficult Very difficult    Social History   Tobacco Use   Smoking status: Never Smoker   Smokeless tobacco: Never Used  Substance Use Topics   Alcohol use: No   Drug use: No    Review of Systems Per HPI unless specifically indicated above     Objective:    BP 111/73    Pulse 78    Temp 98.2 F (36.8 C) (Oral)    Resp 16    Ht 5\' 6"  (1.676 m)    Wt 199 lb (90.3 kg)    BMI 32.12 kg/m   Wt Readings from Last 3 Encounters:  11/16/18 199 lb (90.3 kg)  11/11/18 199 lb 12.8 oz (90.6 kg)  07/22/18 192 lb 12.8 oz (87.5 kg)    Physical Exam Vitals signs and nursing note reviewed.  Constitutional:  General: She is not in acute distress.    Appearance: She is well-developed. She is not diaphoretic.     Comments: Well-appearing, comfortable, cooperative  HENT:     Head: Normocephalic and atraumatic.  Eyes:     General:        Right eye: No discharge.        Left eye: No discharge.     Conjunctiva/sclera: Conjunctivae normal.  Neck:     Musculoskeletal: Normal range of motion and neck supple.     Thyroid: No thyromegaly.  Cardiovascular:     Rate and Rhythm: Normal rate and regular rhythm.     Heart sounds: Normal heart sounds. No murmur.  Pulmonary:     Effort: Pulmonary effort is normal. No respiratory distress.     Breath sounds: Normal breath sounds. No wheezing or rales.    Musculoskeletal: Normal range of motion.  Lymphadenopathy:     Cervical: No cervical adenopathy.  Skin:    General: Skin is warm and dry.     Findings: No erythema or rash.  Neurological:     Mental Status: She is alert and oriented to person, place, and time.  Psychiatric:        Behavior: Behavior normal.     Comments: Well groomed, good eye contact, normal speech and thoughts     EKG Pre-OP done on 11/11/18 - see result in chart. Showed Sinus with PVC, RSR' with possible right conduction delay. No comparison EKG   Results for orders placed or performed during the hospital encounter of 11/11/18  Surgical pcr screen   Specimen: Nasal Mucosa; Nasal Swab  Result Value Ref Range   MRSA, PCR NEGATIVE NEGATIVE   Staphylococcus aureus NEGATIVE NEGATIVE  Urine culture   Specimen: Urine, Clean Catch  Result Value Ref Range   Specimen Description      URINE, CLEAN CATCH Performed at [facility], [address], [place], [place] [address]    Special Requests      NONE Performed at [facility], [place], [place] [address]    Culture (A)     <10,000 COLONIES/mL INSIGNIFICANT GROWTH Performed at [place] [address] [place],  [address]    Report Status 11/12/2018 FINAL   CBC  Result Value Ref Range   WBC 8.6 4.0 - 10.5 K/uL   RBC 5.36 (H) 3.87 - 5.11 MIL/uL   Hemoglobin 15.2 (H) 12.0 - 15.0 g/dL   HCT 47.2 (H) 36.0 - 46.0 %   MCV 88.1 80.0 - 100.0 fL   MCH 28.4 26.0 - 34.0 pg   MCHC 32.2 30.0 - 36.0 g/dL   RDW 13.0 11.5 - 15.5 %   Platelets 451 (H) 150 - 400 K/uL   nRBC 0.0 0.0 - 0.2 %  Basic metabolic panel  Result Value Ref Range   Sodium 140 135 - 145 mmol/L   Potassium 3.8 3.5 - 5.1 mmol/L   Chloride 104 98 - 111 mmol/L   CO2 26 22 - 32 mmol/L   Glucose, Bld 96 70 - 99 mg/dL   BUN 12 8 - 23 mg/dL   Creatinine, Ser 0.81 0.44 - 1.00 mg/dL   Calcium 9.3 8.9 - 10.3 mg/dL   GFR calc non Af Amer >60 >60 mL/min    GFR calc Af Amer >60 >60 mL/min   Anion gap 10 5 - 15  Protime-INR  Result Value Ref Range   Prothrombin Time 13.0 11.4 - 15.2  seconds   INR 1.0 0.8 - 1.2  APTT  Result Value Ref Range   aPTT 31 24 - 36 seconds  Urinalysis, Routine w reflex microscopic  Result Value Ref Range   Color, Urine YELLOW (A) YELLOW   APPearance CLEAR (A) CLEAR   Specific Gravity, Urine 1.011 1.005 - 1.030   pH 5.0 5.0 - 8.0   Glucose, UA NEGATIVE NEGATIVE mg/dL   Hgb urine dipstick NEGATIVE NEGATIVE   Bilirubin Urine NEGATIVE NEGATIVE   Ketones, ur NEGATIVE NEGATIVE mg/dL   Protein, ur NEGATIVE NEGATIVE mg/dL   Nitrite NEGATIVE NEGATIVE   Leukocytes,Ua NEGATIVE NEGATIVE  Type and screen [facility]  Result Value Ref Range   ABO/RH(D) O POS    Antibody Screen NEG    Sample Expiration 11/25/2018,2359    Extend sample reason      NO TRANSFUSIONS OR PREGNANCY IN THE PAST 3 MONTHS Performed at [facility], [address], [place],  [address]       Assessment & Plan:   Problem List Items Addressed This Visit    Hip pain, left    Other Visit Diagnoses    Primary osteoarthritis of left hip    -  Primary   Relevant Medications   diclofenac (VOLTAREN) 75 MG EC tablet   Pre-op examination          No orders of the defined types were placed in this encounter.  Pre-op clearance for non-cardiac surgery today, Left Hip Total Arthroplasty (Intermediate), general anesthesia. - Previously tolerated both major and minor surgeries with general anesthesia in past - No known cardiac hx. Appropriate functional status >4 METs - Never smoker - Known history of asthma well controlled, uncomplicated  Plan: Given recent discovered abnormal EKG with sinus rhythm and PVC with RSR' and possible right conduction delay - patient was referred to Cardiology for cardiac clearance as an extra precaution, given this request for clearance was sent to [place] after patient's pre-op at  hospital.  Patient's regular PCP is on maternity leave and in covering for her, this patient is new to me today, and as part of our discussion, my recommendation is the proceed with the 2nd opinion from cardiology to ensure no further concerns prior to upcoming surgery.  She was scheduled with [facility] Cardiology Dr Fletcher Anon, virtual visit for pre-op clearance to eval recent abnormal EKG tomorrow on Tuesday 11/17/18 in afternoon. Patient is aware of this apt and will proceed.  Anticipate that patient may be able to keep her surgery date of 11/19/18 if also cleared by cardiology.  1. From my standpoint - she is MEDICALLY CLEARED for elective orthopedic surgery. Completed provided pre-op paperwork per [facility] Dr [name] office will fax paperwork to Ortho office today 7/13 2. Already reviewed normal chemistry lab done 11/11/18 in pre-op. Normal kidney function 3. Normal BP. No concerns.  ACC/AHA (reported risk of cardiac death or nonfatal MI generally 1 to 5%). Intermediate Risk (type of surgery = Orthopedic)  Revised Cardiac Risk Index (RCRI) Six independent predictors of major cardiac complications[1] 1. High-risk type of surgery 2. H/o ischemic heart disease (h/o MI, positive exercise test, active CP myocardial ischemia, use of nitrates for CP, EKG w/ pathological Q waves) 3. H/o Heart Failure 4. H/o CVA 5. Diabetes mellitus requiring treatment with insulin 6. Preoperative serum creatinine >2.0 mg/dL (177 micromol/L)  Rate of cardiac death, nonfatal MI, and nonfatal cardiac arrest =  No risk factors - 0.4 percent (95% CI: 0.1-0.8)  Rate of MI, pulmonary edema, v-fib, primary cardiac arrest, and complete heart block = No risk factors - 0.5 percent (95% CI: 0.2-1.1)   Follow up plan: Return if symptoms worsen or fail to improve.   Nobie Putnam, [facility] 11/16/2018, 10:04 AM

## 2018-11-16 NOTE — Patient Instructions (Addendum)
Thank you for coming to the office today.  We will reach out to the [facility] cardiology office today and hopefully they can contact you later and schedule pre-op on their end.  [facility] at [place], [place] [address] Main: [phone]   May be done by phone or in person, goal to get it done before 7/16 for your surgery  You are MEDICALLY CLEARED from my stand point to proceed. Now we just need to get the Cardiac clearance. Again, it was slightly more than just a PVC on the EKG, there was a question about conduction delay in right ventricular. I am not worried about this but would like the 2nd opinion since I do not have a longstanding knowledge of your past history or previous EKGs to compare.   Please schedule a Follow-up Appointment to: Return if symptoms worsen or fail to improve.  If you have any other questions or concerns, please feel free to call the office or send a message through [place]. You may also schedule an earlier appointment if necessary.  Additionally, you may be receiving a survey about your experience at our office within a few days to 1 week by e-mail or mail. We value your feedback.  Nobie Putnam, DO [place]

## 2018-11-17 ENCOUNTER — Telehealth: Payer: Self-pay

## 2018-11-17 ENCOUNTER — Encounter: Payer: Self-pay | Admitting: Cardiovascular Disease

## 2018-11-17 ENCOUNTER — Telehealth (INDEPENDENT_AMBULATORY_CARE_PROVIDER_SITE_OTHER): Payer: Medicare Other | Admitting: Cardiovascular Disease

## 2018-11-17 VITALS — BP 111/72 | HR 78 | Ht 66.0 in | Wt 200.0 lb

## 2018-11-17 DIAGNOSIS — Z01810 Encounter for preprocedural cardiovascular examination: Secondary | ICD-10-CM | POA: Diagnosis not present

## 2018-11-17 DIAGNOSIS — I10 Essential (primary) hypertension: Secondary | ICD-10-CM

## 2018-11-17 NOTE — Patient Instructions (Signed)
Medication Instructions:  No change If you need a refill on your cardiac medications before your next appointment, please call your pharmacy.   Lab work: None If you have labs (blood work) drawn today and your tests are completely normal, you will receive your results only by: Marland Kitchen MyChart Message (if you have MyChart) OR . A paper copy in the mail If you have any lab test that is abnormal or we need to change your treatment, we will call you to review the results.  Testing/Procedures: None  Follow-Up: As needed Any Other Special Instructions Will Be Listed Below (If Applicable). You are at low risk for the upcoming surgery from a cardiac standpoint.

## 2018-11-17 NOTE — Telephone Encounter (Signed)

## 2018-11-17 NOTE — Progress Notes (Signed)
Virtual Visit via Video Note   This visit type was conducted due to national recommendations for restrictions regarding the COVID-19 Pandemic (e.g. social distancing) in an effort to limit this patient's exposure and mitigate transmission in our community.  Due to her co-morbid illnesses, this patient is at least at moderate risk for complications without adequate follow up.  This format is felt to be most appropriate for this patient at this time.  All issues noted in this document were discussed and addressed.  A limited physical exam was performed with this format.  Please refer to the patient's chart for her consent to telehealth for [facility].   Date:  11/17/2018   ID:  Michele Newton, DOB [birth year], MRN [id number]  Patient Location: Home Provider Location: Office  PCP:  Mikey College, NP  Cardiologist:  No primary care provider on file.  Electrophysiologist:  None   Evaluation Performed:  Consultation - Michele Newton was referred by Dr. Parks Ranger for preoperative cardiovascular evaluation and abnormal EKG  Chief Complaint: Left hip pain  History of Present Illness:    Michele Newton is a 63 y.o. female was referred for preoperative cardiovascular evaluation given minor EKG changes.  She has no prior cardiac history.  She is relatively healthy with controlled hypertension, GERD, asthma and multiple previous surgeries listed below.  She never had any issues with any of her previous surgeries. She has severe osteoarthritis and is scheduled for left hip surgery later this week.  She had an EKG done which showed incomplete right bundle branch block and 1 PVC.  Thus, she was referred to [place].  She denies any chest pain, shortness of breath or palpitations.  No syncope or presyncope.  In spite of her limitations related to hip pain, she is able to do activities of daily living.  The patient does not have symptoms concerning for COVID-19 infection (fever, chills, cough, or new shortness  of breath).    Past Medical History:  Diagnosis Date  . Anxiety   . Arthritis   . Asthma   . Depression   . Frequent headaches   . GERD (gastroesophageal reflux disease)   . Migraine   . Psoriatic arthritis [facility])    Past Surgical History:  Procedure Laterality Date  . BREAST SURGERY  2000   biopsy  . NASAL SINUS SURGERY  1998  . TONSILLECTOMY AND ADENOIDECTOMY  1985  . TUBAL LIGATION    . TYMPANOSTOMY TUBE PLACEMENT  1998  . VENTRICULAR ATRIAL SHUNT Right 1995     Current Meds  Medication Sig  . albuterol (VENTOLIN HFA) 108 (90 Base) MCG/ACT inhaler Inhale 2 puffs into the lungs every 6 (six) hours as needed for wheezing or shortness of breath.  . cyclobenzaprine (FLEXERIL) 5 MG tablet TAKE 1 TABLET(5 MG) BY MOUTH AT BEDTIME AS NEEDED FOR MUSCLE SPASMS (Patient taking differently: Take 5 mg by mouth at bedtime as needed for muscle spasms. )  . diclofenac (VOLTAREN) 75 MG EC tablet diclofenac sodium 75 mg tablet,delayed release  . fluticasone (FLONASE) 50 MCG/ACT nasal spray Place 2 sprays into both nostrils daily. (Patient taking differently: Place 2 sprays into both nostrils daily as needed for allergies. )  . HYDROcodone-acetaminophen (NORCO/VICODIN) 5-325 MG tablet Take 1 tablet by mouth every 12 (twelve) hours as needed for moderate pain.   . montelukast (SINGULAIR) 10 MG tablet TK 1 T PO HS (Patient taking differently: Take 10 mg by mouth at bedtime. )  . naproxen sodium (ALEVE) 220 MG  tablet Take 440 mg by mouth at bedtime as needed (for pain or headache).  . pantoprazole (PROTONIX) 40 MG tablet TAKE 1 TABLET(40 MG) BY MOUTH TWICE DAILY (Patient taking differently: Take 40 mg by mouth 2 (two) times daily. )  . PARoxetine (PAXIL) 40 MG tablet TAKE 1 TABLET BY MOUTH DAILY (Patient taking differently: Take 40 mg by mouth at bedtime. )  . QUEtiapine (SEROQUEL) 200 MG tablet TAKE 1 TABLET(200 MG) BY MOUTH AT BEDTIME (Patient taking differently: Take 200 mg by mouth at bedtime. )   . valsartan (DIOVAN) 160 MG tablet valsartan 160 mg tablet     Allergies:   Amitriptyline, Effexor [venlafaxine], and Haldol [haloperidol]   Social History   Tobacco Use  . Smoking status: Never Smoker  . Smokeless tobacco: Never Used  Substance Use Topics  . Alcohol use: No  . Drug use: No     Family Hx: The patient's family history includes Arthritis in her father and mother; Breast cancer in her maternal grandmother; Cancer in her father; Depression in her mother; Diabetes in her brother and mother; Heart disease (age of onset: 28) in her mother; Hyperlipidemia in her mother; Hypertension in her mother.  ROS:   Please see the history of present illness.     All other systems reviewed and are negative.   Prior CV studies:   The following studies were reviewed today:    Labs/Other Tests and Data Reviewed:    EKG:  An ECG dated November 13, 2018 was personally reviewed today and demonstrated:  Normal sinus rhythm with incomplete right bundle branch block and 1 PVC  Recent Labs: 11/11/2018: BUN 12; Creatinine, Ser 0.81; Hemoglobin 15.2; Platelets 451; Potassium 3.8; Sodium 140   Recent Lipid Panel Lab Results  Component Value Date/Time   CHOL 289 (H) 02/20/2017 08:41 AM   TRIG 165.0 (H) 02/20/2017 08:41 AM   HDL 38.40 (L) 02/20/2017 08:41 AM   CHOLHDL 8 02/20/2017 08:41 AM   LDLCALC 218 (H) 02/20/2017 08:41 AM    Wt Readings from Last 3 Encounters:  11/17/18 200 lb (90.7 kg)  11/16/18 199 lb (90.3 kg)  11/11/18 199 lb 12.8 oz (90.6 kg)     Objective:    Vital Signs:  BP 111/72   Pulse 78   Ht 5\' 6"  (1.676 m)   Wt 200 lb (90.7 kg)   BMI 32.28 kg/m    VITAL SIGNS:  reviewed GEN:  no acute distress EYES:  sclerae anicteric, EOMI - Extraocular Movements Intact RESPIRATORY:  normal respiratory effort, symmetric expansion SKIN:  no rash, lesions or ulcers. MUSCULOSKELETAL:  no obvious deformities. NEURO:  alert and oriented x 3, no obvious focal deficit PSYCH:   normal affect  ASSESSMENT & PLAN:    1. Preoperative cardiovascular evaluation for hip surgery: The patient has no cardiac symptoms.  Her functional capacity is above 4 METS.  I reviewed her EKG which is overall benign.  She does have incomplete right bundle branch block which is considered a normal variant.  There is also 1 PVC which seems to be asymptomatic.  Based on this, she is considered at low risk from a cardiovascular standpoint.  No further testing is needed before her surgery which is scheduled in 2 days.  2.  Essential hypertension: Blood pressures controlled.  COVID-19 Education: The signs and symptoms of COVID-19 were discussed with the patient and how to seek care for testing (follow up with PCP or arrange E-visit).  The importance of social distancing  was discussed today.  Time:   Today, I have spent 10 minutes with the patient with telehealth technology discussing the above problems.     Medication Adjustments/Labs and Tests Ordered: Current medicines are reviewed at length with the patient today.  Concerns regarding medicines are outlined above.   Tests Ordered: No orders of the defined types were placed in this encounter.   Medication Changes: No orders of the defined types were placed in this encounter.   Follow Up: As needed  Signed, Kathlyn Sacramento, MD  11/17/2018 2:06 PM    [place]

## 2018-11-19 ENCOUNTER — Inpatient Hospital Stay: Payer: Medicare Other | Admitting: Anesthesiology

## 2018-11-19 ENCOUNTER — Encounter
Admission: RE | Disposition: A | Discharge status: Home Health | Payer: Self-pay | Source: Home / Self Care | Attending: Orthopedic Surgery

## 2018-11-19 ENCOUNTER — Inpatient Hospital Stay: Payer: Medicare Other

## 2018-11-19 ENCOUNTER — Encounter: Payer: Self-pay | Admitting: *Deleted

## 2018-11-19 ENCOUNTER — Other Ambulatory Visit: Payer: Self-pay

## 2018-11-19 ENCOUNTER — Inpatient Hospital Stay
Admission: RE | Admit: 2018-11-19 | Discharge: 2018-11-21 | DRG: 470 | Disposition: A | Discharge status: Home Health | Payer: Medicare Other | Attending: Orthopedic Surgery | Admitting: Orthopedic Surgery

## 2018-11-19 DIAGNOSIS — G8918 Other acute postprocedural pain: Secondary | ICD-10-CM

## 2018-11-19 DIAGNOSIS — J4520 Mild intermittent asthma, uncomplicated: Secondary | ICD-10-CM | POA: Diagnosis present

## 2018-11-19 DIAGNOSIS — Z79899 Other long term (current) drug therapy: Secondary | ICD-10-CM

## 2018-11-19 DIAGNOSIS — Z982 Presence of cerebrospinal fluid drainage device: Secondary | ICD-10-CM | POA: Diagnosis not present

## 2018-11-19 DIAGNOSIS — F419 Anxiety disorder, unspecified: Secondary | ICD-10-CM | POA: Diagnosis present

## 2018-11-19 DIAGNOSIS — Z7951 Long term (current) use of inhaled steroids: Secondary | ICD-10-CM | POA: Diagnosis not present

## 2018-11-19 DIAGNOSIS — G8929 Other chronic pain: Secondary | ICD-10-CM | POA: Diagnosis present

## 2018-11-19 DIAGNOSIS — M160 Bilateral primary osteoarthritis of hip: Principal | ICD-10-CM | POA: Diagnosis present

## 2018-11-19 DIAGNOSIS — F329 Major depressive disorder, single episode, unspecified: Secondary | ICD-10-CM | POA: Diagnosis present

## 2018-11-19 DIAGNOSIS — I10 Essential (primary) hypertension: Secondary | ICD-10-CM | POA: Diagnosis present

## 2018-11-19 DIAGNOSIS — Z96642 Presence of left artificial hip joint: Secondary | ICD-10-CM

## 2018-11-19 DIAGNOSIS — J309 Allergic rhinitis, unspecified: Secondary | ICD-10-CM | POA: Diagnosis present

## 2018-11-19 DIAGNOSIS — Z888 Allergy status to other drugs, medicaments and biological substances status: Secondary | ICD-10-CM

## 2018-11-19 DIAGNOSIS — Z419 Encounter for procedure for purposes other than remedying health state, unspecified: Secondary | ICD-10-CM

## 2018-11-19 DIAGNOSIS — L4050 Arthropathic psoriasis, unspecified: Secondary | ICD-10-CM | POA: Diagnosis present

## 2018-11-19 DIAGNOSIS — M545 Low back pain: Secondary | ICD-10-CM | POA: Diagnosis present

## 2018-11-19 DIAGNOSIS — K219 Gastro-esophageal reflux disease without esophagitis: Secondary | ICD-10-CM | POA: Diagnosis present

## 2018-11-19 DIAGNOSIS — M1612 Unilateral primary osteoarthritis, left hip: Secondary | ICD-10-CM | POA: Diagnosis present

## 2018-11-19 DIAGNOSIS — Z471 Aftercare following joint replacement surgery: Secondary | ICD-10-CM | POA: Diagnosis not present

## 2018-11-19 DIAGNOSIS — Z8261 Family history of arthritis: Secondary | ICD-10-CM

## 2018-11-19 HISTORY — PX: TOTAL HIP ARTHROPLASTY: SHX124

## 2018-11-19 LAB — CBC
HCT: 41.3 % (ref 36.0–46.0)
Hemoglobin: 13.3 g/dL (ref 12.0–15.0)
MCH: 28.2 pg (ref 26.0–34.0)
MCHC: 32.2 g/dL (ref 30.0–36.0)
MCV: 87.7 fL (ref 80.0–100.0)
Platelets: 389 10*3/uL (ref 150–400)
RBC: 4.71 MIL/uL (ref 3.87–5.11)
RDW: 13.1 % (ref 11.5–15.5)
WBC: 12.3 10*3/uL — ABNORMAL HIGH (ref 4.0–10.5)
nRBC: 0 % (ref 0.0–0.2)

## 2018-11-19 LAB — CREATININE, SERUM
Creatinine, Ser: 0.82 mg/dL (ref 0.44–1.00)
GFR calc Af Amer: >60 mL/min (ref >60)
GFR calc non Af Amer: >60 mL/min (ref >60)

## 2018-11-19 LAB — ABO/RH: ABO/RH(D): O POS

## 2018-11-19 SURGERY — ARTHROPLASTY, HIP, TOTAL, ANTERIOR APPROACH
Anesthesia: Spinal | Laterality: Left

## 2018-11-19 MED ORDER — FENTANYL CITRATE (PF) 100 MCG/2ML IJ SOLN
INTRAMUSCULAR | Status: AC
  Filled 2018-11-19: qty 2

## 2018-11-19 MED ORDER — PAROXETINE HCL 20 MG PO TABS
40.0000 mg | ORAL_TABLET | Freq: Every day | ORAL | Status: DC
  Administered 2018-11-19 – 2018-11-20 (×2): 40 mg via ORAL
  Filled 2018-11-19 (×2): qty 2

## 2018-11-19 MED ORDER — METOCLOPRAMIDE HCL 10 MG PO TABS: 5.0000 mg | ORAL_TABLET | Freq: Three times a day (TID) | ORAL | Status: DC | PRN

## 2018-11-19 MED ORDER — SODIUM CHLORIDE 0.9 % IV SOLN
INTRAVENOUS | Status: DC | PRN
  Administered 2018-11-19: 12:00:00 25 ug/min via INTRAVENOUS

## 2018-11-19 MED ORDER — CEFAZOLIN SODIUM-DEXTROSE 2-4 GM/100ML-% IV SOLN
2.0000 g | Freq: Once | INTRAVENOUS | Status: AC
  Administered 2018-11-19: 12:00:00 2 g via INTRAVENOUS

## 2018-11-19 MED ORDER — NEOMYCIN-POLYMYXIN B GU 40-200000 IR SOLN
Status: DC | PRN
  Administered 2018-11-19: 4 mL

## 2018-11-19 MED ORDER — BUPIVACAINE-EPINEPHRINE 0.25% -1:200000 IJ SOLN
INTRAMUSCULAR | Status: DC | PRN
  Administered 2018-11-19: 30 mL

## 2018-11-19 MED ORDER — BUPIVACAINE HCL (PF) 0.5 % IJ SOLN
INTRAMUSCULAR | Status: DC | PRN
  Administered 2018-11-19: 3 mL

## 2018-11-19 MED ORDER — MIDAZOLAM HCL 2 MG/2ML IJ SOLN
INTRAMUSCULAR | Status: AC
  Filled 2018-11-19: qty 2

## 2018-11-19 MED ORDER — SODIUM CHLORIDE FLUSH 0.9 % IV SOLN
INTRAVENOUS | Status: AC
  Filled 2018-11-19: qty 10

## 2018-11-19 MED ORDER — BUPIVACAINE-EPINEPHRINE (PF) 0.25% -1:200000 IJ SOLN
INTRAMUSCULAR | Status: AC
  Filled 2018-11-19: qty 30

## 2018-11-19 MED ORDER — ONDANSETRON HCL 4 MG PO TABS: 4.0000 mg | ORAL_TABLET | Freq: Four times a day (QID) | ORAL | Status: DC | PRN

## 2018-11-19 MED ORDER — ACETAMINOPHEN 325 MG PO TABS: 325.0000 mg | ORAL_TABLET | Freq: Four times a day (QID) | ORAL | Status: DC | PRN

## 2018-11-19 MED ORDER — ALUM & MAG HYDROXIDE-SIMETH 200-200-20 MG/5ML PO SUSP: 30.0000 mL | ORAL | Status: DC | PRN

## 2018-11-19 MED ORDER — MAGNESIUM CITRATE PO SOLN
1.0000 | Freq: Once | ORAL | Status: DC | PRN
  Filled 2018-11-19: qty 296

## 2018-11-19 MED ORDER — BUPIVACAINE LIPOSOME 1.3 % IJ SUSP
INTRAMUSCULAR | Status: AC
  Filled 2018-11-19: qty 20

## 2018-11-19 MED ORDER — ONDANSETRON HCL 4 MG/2ML IJ SOLN: 4.0000 mg | Freq: Four times a day (QID) | INTRAMUSCULAR | Status: DC | PRN

## 2018-11-19 MED ORDER — FENTANYL CITRATE (PF) 100 MCG/2ML IJ SOLN
INTRAMUSCULAR | Status: AC
  Administered 2018-11-19: 25 ug via INTRAVENOUS
  Filled 2018-11-19: qty 2

## 2018-11-19 MED ORDER — CEFAZOLIN SODIUM-DEXTROSE 2-4 GM/100ML-% IV SOLN
2.0000 g | Freq: Four times a day (QID) | INTRAVENOUS | Status: AC
  Administered 2018-11-19 (×2): 2 g via INTRAVENOUS
  Filled 2018-11-19 (×2): qty 100

## 2018-11-19 MED ORDER — SODIUM CHLORIDE 0.9 % IV SOLN
INTRAVENOUS | Status: DC
  Administered 2018-11-19 (×2): via INTRAVENOUS

## 2018-11-19 MED ORDER — LACTATED RINGERS IV SOLN
INTRAVENOUS | Status: DC
  Administered 2018-11-19: 09:00:00 via INTRAVENOUS

## 2018-11-19 MED ORDER — PANTOPRAZOLE SODIUM 40 MG PO TBEC
40.0000 mg | DELAYED_RELEASE_TABLET | Freq: Two times a day (BID) | ORAL | Status: DC
  Administered 2018-11-19 – 2018-11-21 (×4): 40 mg via ORAL
  Filled 2018-11-19 (×4): qty 1

## 2018-11-19 MED ORDER — PROMETHAZINE HCL 25 MG/ML IJ SOLN: 6.2500 mg | INTRAMUSCULAR | Status: DC | PRN

## 2018-11-19 MED ORDER — PROPOFOL 500 MG/50ML IV EMUL
INTRAVENOUS | Status: DC | PRN
  Administered 2018-11-19: 75 ug/kg/min via INTRAVENOUS

## 2018-11-19 MED ORDER — FLUTICASONE PROPIONATE 50 MCG/ACT NA SUSP
2.0000 | Freq: Every day | NASAL | Status: DC | PRN
  Filled 2018-11-19: qty 16

## 2018-11-19 MED ORDER — CEFAZOLIN SODIUM-DEXTROSE 2-4 GM/100ML-% IV SOLN
INTRAVENOUS | Status: AC
  Filled 2018-11-19: qty 100

## 2018-11-19 MED ORDER — BISACODYL 5 MG PO TBEC
5.0000 mg | DELAYED_RELEASE_TABLET | Freq: Every day | ORAL | Status: DC | PRN
  Administered 2018-11-21: 5 mg via ORAL
  Filled 2018-11-19: qty 1

## 2018-11-19 MED ORDER — OXYCODONE HCL 5 MG PO TABS
ORAL_TABLET | ORAL | Status: AC
  Filled 2018-11-19: qty 1

## 2018-11-19 MED ORDER — METHOCARBAMOL 500 MG PO TABS
500.0000 mg | ORAL_TABLET | Freq: Four times a day (QID) | ORAL | Status: DC | PRN
  Administered 2018-11-19 – 2018-11-20 (×3): 500 mg via ORAL
  Filled 2018-11-19 (×3): qty 1

## 2018-11-19 MED ORDER — MENTHOL 3 MG MT LOZG
1.0000 | LOZENGE | OROMUCOSAL | Status: DC | PRN
  Filled 2018-11-19: qty 9

## 2018-11-19 MED ORDER — ENOXAPARIN SODIUM 40 MG/0.4ML ~~LOC~~ SOLN
40.0000 mg | SUBCUTANEOUS | Status: DC
  Administered 2018-11-20 – 2018-11-21 (×2): 40 mg via SUBCUTANEOUS
  Filled 2018-11-19 (×2): qty 0.4

## 2018-11-19 MED ORDER — PROPOFOL 10 MG/ML IV BOLUS
INTRAVENOUS | Status: AC
  Filled 2018-11-19: qty 20

## 2018-11-19 MED ORDER — GLYCOPYRROLATE 0.2 MG/ML IJ SOLN
INTRAMUSCULAR | Status: AC
  Filled 2018-11-19: qty 1

## 2018-11-19 MED ORDER — OXYCODONE HCL 5 MG PO TABS
5.0000 mg | ORAL_TABLET | Freq: Once | ORAL | Status: AC
  Administered 2018-11-19: 5 mg via ORAL

## 2018-11-19 MED ORDER — NEOMYCIN-POLYMYXIN B GU 40-200000 IR SOLN
Status: AC
  Filled 2018-11-19: qty 1

## 2018-11-19 MED ORDER — PHENYLEPHRINE HCL (PRESSORS) 10 MG/ML IV SOLN
INTRAVENOUS | Status: DC | PRN
  Administered 2018-11-19 (×4): 100 ug via INTRAVENOUS

## 2018-11-19 MED ORDER — MIDAZOLAM HCL 5 MG/5ML IJ SOLN
INTRAMUSCULAR | Status: DC | PRN
  Administered 2018-11-19: 2 mg via INTRAVENOUS

## 2018-11-19 MED ORDER — PROPOFOL 500 MG/50ML IV EMUL
INTRAVENOUS | Status: AC
  Filled 2018-11-19: qty 50

## 2018-11-19 MED ORDER — OXYCODONE HCL 5 MG PO TABS
10.0000 mg | ORAL_TABLET | ORAL | Status: DC | PRN
  Administered 2018-11-20 (×3): 10 mg via ORAL
  Filled 2018-11-19 (×2): qty 2

## 2018-11-19 MED ORDER — FENTANYL CITRATE (PF) 100 MCG/2ML IJ SOLN
25.0000 ug | INTRAMUSCULAR | Status: DC | PRN
  Administered 2018-11-19: 25 ug via INTRAVENOUS

## 2018-11-19 MED ORDER — IRBESARTAN 150 MG PO TABS
150.0000 mg | ORAL_TABLET | Freq: Every day | ORAL | Status: DC
  Administered 2018-11-21: 150 mg via ORAL
  Filled 2018-11-19 (×2): qty 1

## 2018-11-19 MED ORDER — TRAMADOL HCL 50 MG PO TABS
50.0000 mg | ORAL_TABLET | Freq: Four times a day (QID) | ORAL | Status: DC
  Administered 2018-11-19 – 2018-11-21 (×8): 50 mg via ORAL
  Filled 2018-11-19 (×8): qty 1

## 2018-11-19 MED ORDER — SODIUM CHLORIDE 0.9 % IV SOLN
INTRAVENOUS | Status: DC | PRN
  Administered 2018-11-19: 12:00:00 80 mL

## 2018-11-19 MED ORDER — DOCUSATE SODIUM 100 MG PO CAPS
100.0000 mg | ORAL_CAPSULE | Freq: Two times a day (BID) | ORAL | Status: DC
  Administered 2018-11-19 – 2018-11-21 (×4): 100 mg via ORAL
  Filled 2018-11-19 (×4): qty 1

## 2018-11-19 MED ORDER — PROPOFOL 10 MG/ML IV BOLUS
INTRAVENOUS | Status: DC | PRN
  Administered 2018-11-19: 30 mg via INTRAVENOUS

## 2018-11-19 MED ORDER — PHENOL 1.4 % MT LIQD
1.0000 | OROMUCOSAL | Status: DC | PRN
  Filled 2018-11-19: qty 177

## 2018-11-19 MED ORDER — QUETIAPINE FUMARATE 200 MG PO TABS
200.0000 mg | ORAL_TABLET | Freq: Every day | ORAL | Status: DC
  Administered 2018-11-19 – 2018-11-20 (×2): 200 mg via ORAL
  Filled 2018-11-19: qty 1
  Filled 2018-11-19 (×2): qty 8
  Filled 2018-11-19: qty 1

## 2018-11-19 MED ORDER — DIPHENHYDRAMINE HCL 12.5 MG/5ML PO ELIX: 12.5000 mg | ORAL_SOLUTION | ORAL | Status: DC | PRN

## 2018-11-19 MED ORDER — ACETAMINOPHEN 500 MG PO TABS
1000.0000 mg | ORAL_TABLET | Freq: Four times a day (QID) | ORAL | Status: AC
  Administered 2018-11-19 – 2018-11-20 (×4): 1000 mg via ORAL
  Filled 2018-11-19 (×4): qty 2

## 2018-11-19 MED ORDER — METHOCARBAMOL 1000 MG/10ML IJ SOLN
500.0000 mg | Freq: Four times a day (QID) | INTRAVENOUS | Status: DC | PRN
  Filled 2018-11-19: qty 5

## 2018-11-19 MED ORDER — FAMOTIDINE 20 MG PO TABS
ORAL_TABLET | ORAL | Status: AC
  Administered 2018-11-19: 20 mg via ORAL
  Filled 2018-11-19: qty 1

## 2018-11-19 MED ORDER — FAMOTIDINE 20 MG PO TABS
20.0000 mg | ORAL_TABLET | Freq: Once | ORAL | Status: AC
  Administered 2018-11-19: 20 mg via ORAL

## 2018-11-19 MED ORDER — FENTANYL CITRATE (PF) 100 MCG/2ML IJ SOLN
INTRAMUSCULAR | Status: DC | PRN
  Administered 2018-11-19 (×2): 50 ug via INTRAVENOUS

## 2018-11-19 MED ORDER — HYDROMORPHONE HCL 1 MG/ML IJ SOLN
0.5000 mg | INTRAMUSCULAR | Status: DC | PRN
  Administered 2018-11-19 (×2): 0.5 mg via INTRAVENOUS
  Filled 2018-11-19 (×2): qty 1

## 2018-11-19 MED ORDER — MAGNESIUM HYDROXIDE 400 MG/5ML PO SUSP
30.0000 mL | Freq: Every day | ORAL | Status: DC | PRN
  Administered 2018-11-20: 30 mL via ORAL
  Filled 2018-11-19: qty 30

## 2018-11-19 MED ORDER — ALBUTEROL SULFATE (2.5 MG/3ML) 0.083% IN NEBU: 3.0000 mL | INHALATION_SOLUTION | Freq: Four times a day (QID) | RESPIRATORY_TRACT | Status: DC | PRN

## 2018-11-19 MED ORDER — SODIUM CHLORIDE (PF) 0.9 % IJ SOLN
INTRAMUSCULAR | Status: AC
  Filled 2018-11-19: qty 50

## 2018-11-19 MED ORDER — ZOLPIDEM TARTRATE 5 MG PO TABS: 5.0000 mg | ORAL_TABLET | Freq: Every evening | ORAL | Status: DC | PRN

## 2018-11-19 MED ORDER — METOCLOPRAMIDE HCL 5 MG/ML IJ SOLN: 5.0000 mg | Freq: Three times a day (TID) | INTRAMUSCULAR | Status: DC | PRN

## 2018-11-19 MED ORDER — MONTELUKAST SODIUM 10 MG PO TABS
10.0000 mg | ORAL_TABLET | Freq: Every day | ORAL | Status: DC
  Administered 2018-11-19 – 2018-11-20 (×2): 10 mg via ORAL
  Filled 2018-11-19 (×2): qty 1

## 2018-11-19 MED ORDER — OXYCODONE HCL 5 MG PO TABS
5.0000 mg | ORAL_TABLET | ORAL | Status: DC | PRN
  Administered 2018-11-19: 10 mg via ORAL
  Administered 2018-11-19: 5 mg via ORAL
  Administered 2018-11-20: 10 mg via ORAL
  Filled 2018-11-19: qty 2
  Filled 2018-11-19: qty 1
  Filled 2018-11-19 (×2): qty 2

## 2018-11-19 SURGICAL SUPPLY — 59 items
BLADE SAGITTAL AGGR TOOTH XLG (BLADE) ×3 IMPLANT
BNDG COHESIVE 6X5 TAN STRL LF (GAUZE/BANDAGES/DRESSINGS) ×9 IMPLANT
CANISTER SUCT 1200ML W/VALVE (MISCELLANEOUS) ×3 IMPLANT
CANISTER WOUND CARE 500ML ATS (WOUND CARE) ×3 IMPLANT
CHLORAPREP W/TINT 26 (MISCELLANEOUS) ×3 IMPLANT
COVER WAND RF STERILE (DRAPES) ×3 IMPLANT
DRAPE 3/4 80X56 (DRAPES) ×9 IMPLANT
DRAPE C-ARM XRAY 36X54 (DRAPES) ×3 IMPLANT
DRAPE INCISE IOBAN 66X60 STRL (DRAPES) IMPLANT
DRAPE POUCH INSTRU U-SHP 10X18 (DRAPES) ×3 IMPLANT
DRAPE TABLE BACK 80X90 (DRAPES) ×3 IMPLANT
DRESSING SURGICEL FIBRLLR 1X2 (HEMOSTASIS) ×2 IMPLANT
DRSG OPSITE POSTOP 4X8 (GAUZE/BANDAGES/DRESSINGS) ×6 IMPLANT
DRSG SURGICEL FIBRILLAR 1X2 (HEMOSTASIS) ×6
ELECT BLADE 6.5 EXT (BLADE) ×3 IMPLANT
ELECT REM PT RETURN 9FT ADLT (ELECTROSURGICAL) ×3
ELECTRODE REM PT RTRN 9FT ADLT (ELECTROSURGICAL) ×1 IMPLANT
GLOVE BIOGEL PI IND STRL 9 (GLOVE) ×1 IMPLANT
GLOVE BIOGEL PI INDICATOR 9 (GLOVE) ×2
GLOVE SURG SYN 9.0  PF PI (GLOVE) ×4
GLOVE SURG SYN 9.0 PF PI (GLOVE) ×2 IMPLANT
GOWN SRG 2XL LVL 4 RGLN SLV (GOWNS) ×1 IMPLANT
GOWN STRL NON-REIN 2XL LVL4 (GOWNS) ×2
GOWN STRL REUS W/ TWL LRG LVL3 (GOWN DISPOSABLE) ×1 IMPLANT
GOWN STRL REUS W/TWL LRG LVL3 (GOWN DISPOSABLE) ×2
HEAD FEMORAL 28MM SZ M (Head) ×3 IMPLANT
HEMOVAC 400CC 10FR (MISCELLANEOUS) IMPLANT
HOLDER FOLEY CATH W/STRAP (MISCELLANEOUS) ×3 IMPLANT
HOOD PEEL AWAY FLYTE STAYCOOL (MISCELLANEOUS) ×3 IMPLANT
KIT PREVENA INCISION MGT 13 (CANNISTER) ×3 IMPLANT
LINER DUAL MOB 50MM (Liner) ×3 IMPLANT
MAT ABSORB  FLUID 56X50 GRAY (MISCELLANEOUS) ×2
MAT ABSORB FLUID 56X50 GRAY (MISCELLANEOUS) ×1 IMPLANT
NDL SAFETY ECLIPSE 18X1.5 (NEEDLE) ×1 IMPLANT
NEEDLE HYPO 18GX1.5 SHARP (NEEDLE) ×2
NEEDLE SPNL 20GX3.5 QUINCKE YW (NEEDLE) ×6 IMPLANT
NS IRRIG 1000ML POUR BTL (IV SOLUTION) ×3 IMPLANT
PACK HIP COMPR (MISCELLANEOUS) ×3 IMPLANT
SCALPEL PROTECTED #10 DISP (BLADE) ×6 IMPLANT
SHELL ACETABULAR SZ0 50 DME (Shell) ×3 IMPLANT
SOL PREP PVP 2OZ (MISCELLANEOUS) ×3
SOLUTION PREP PVP 2OZ (MISCELLANEOUS) ×1 IMPLANT
SPONGE DRAIN TRACH 4X4 STRL 2S (GAUZE/BANDAGES/DRESSINGS) ×3 IMPLANT
STAPLER SKIN PROX 35W (STAPLE) ×3 IMPLANT
STEM FEMORAL SZ2 STD COLLARED (Stem) ×3 IMPLANT
STRAP SAFETY 5IN WIDE (MISCELLANEOUS) ×3 IMPLANT
SUT DVC 2 QUILL PDO  T11 36X36 (SUTURE) ×2
SUT DVC 2 QUILL PDO T11 36X36 (SUTURE) ×1 IMPLANT
SUT SILK 0 (SUTURE) ×2
SUT SILK 0 30XBRD TIE 6 (SUTURE) ×1 IMPLANT
SUT V-LOC 90 ABS DVC 3-0 CL (SUTURE) ×3 IMPLANT
SUT VIC AB 1 CT1 36 (SUTURE) ×3 IMPLANT
SYR 20CC LL (SYRINGE) ×3 IMPLANT
SYR 30ML LL (SYRINGE) ×3 IMPLANT
SYR 50ML LL SCALE MARK (SYRINGE) ×6 IMPLANT
SYR BULB IRRIG 60ML STRL (SYRINGE) ×3 IMPLANT
TAPE MICROFOAM 4IN (TAPE) ×3 IMPLANT
TOWEL OR 17X26 4PK STRL BLUE (TOWEL DISPOSABLE) ×3 IMPLANT
TRAY FOLEY MTR SLVR 16FR STAT (SET/KITS/TRAYS/PACK) ×3 IMPLANT

## 2018-11-19 NOTE — Progress Notes (Signed)
Patient with request for heating pad for her back. Notified Dr. Roland Rack. Order received for Kpad prn

## 2018-11-19 NOTE — Anesthesia Procedure Notes (Signed)
Spinal  Patient location during procedure: OR Start time: 11/19/2018 11:11 AM End time: 11/19/2018 11:27 AM Staffing Anesthesiologist: Martha Clan, MD Resident/CRNA: Kai Railsback, Einar Grad, CRNA Performed: resident/CRNA and anesthesiologist  Preanesthetic Checklist Completed: patient identified, site marked, surgical consent, pre-op evaluation, timeout performed, IV checked, risks and benefits discussed and monitors and equipment checked Spinal Block Patient position: sitting Prep: ChloraPrep Patient monitoring: heart rate, cardiac monitor, continuous pulse ox and blood pressure Approach: midline Location: L4-5 Injection technique: single-shot Needle Needle type: Sprotte  Needle gauge: 24 G Needle length: 9 cm Assessment Sensory level: T10 Additional Notes Patient identified and IV in place prior to procedure start.  Expiration date verified and within date.  Good CSF flow prior to injection of medications.  No paresthesias or other complications noted during placement.  Patient tolerated the procedure well.

## 2018-11-19 NOTE — Anesthesia Post-op Follow-up Note (Signed)
Anesthesia QCDR form completed.        

## 2018-11-19 NOTE — Anesthesia Preprocedure Evaluation (Addendum)
Anesthesia Evaluation  Patient identified by MRN, date of birth, ID band Patient awake    Reviewed: Allergy & Precautions, H&P , NPO status , Patient's Chart, lab work & pertinent test results, reviewed documented beta blocker date and time   History of Anesthesia Complications Negative for: history of anesthetic complications  Airway Mallampati: III  TM Distance: >3 FB Neck ROM: full    Dental  (+) Dental Advidsory Given, Partial Upper, Partial Lower, Missing   Pulmonary neg shortness of breath, asthma , neg recent URI,           Cardiovascular Exercise Tolerance: Good hypertension, (-) angina(-) Past MI (-) dysrhythmias (-) Valvular Problems/Murmurs     Neuro/Psych  Headaches (secondary to caffeine withdrawl), neg Seizures PSYCHIATRIC DISORDERS Anxiety Depression VP shunt in place, no recent symptoms of ICP.    GI/Hepatic Neg liver ROS, GERD  ,  Endo/Other  negative endocrine ROS  Renal/GU negative Renal ROS  negative genitourinary   Musculoskeletal   Abdominal   Peds  Hematology negative hematology ROS (+)   Anesthesia Other Findings Past Medical History: No date: Anxiety No date: Arthritis No date: Asthma No date: Depression No date: Frequent headaches No date: GERD (gastroesophageal reflux disease) No date: Migraine No date: Psoriatic arthritis (HCC)   Reproductive/Obstetrics negative OB ROS                            Anesthesia Physical Anesthesia Plan  ASA: II  Anesthesia Plan: Spinal   Post-op Pain Management:    Induction: Intravenous  PONV Risk Score and Plan: 2 and Propofol infusion and TIVA  Airway Management Planned: Simple Face Mask and Natural Airway  Additional Equipment:   Intra-op Plan:   Post-operative Plan:   Informed Consent: I have reviewed the patients History and Physical, chart, labs and discussed the procedure including the risks, benefits and  alternatives for the proposed anesthesia with the patient or authorized representative who has indicated his/her understanding and acceptance.     Dental Advisory Given  Plan Discussed with: Anesthesiologist, CRNA and Surgeon  Anesthesia Plan Comments:         Anesthesia Quick Evaluation

## 2018-11-19 NOTE — H&P (Signed)
Reviewed paper H+P, will be scanned into chart. No changes noted.  

## 2018-11-19 NOTE — Evaluation (Signed)
Physical Therapy Evaluation Patient Details Name: Michele Newton MRN: [id number] DOB: [birth year] Today's Date: 11/19/2018   History of Present Illness  63 yo female with onset of L hip pain with OA and failed conservative measures was admitted for L THA with direct anterior approach and is WBAT.  PMHx:  CAD, MI, chest pain, myocardial ischemia, CHF, OA, (+) exercise test, CVA, DM,   Clinical Impression  Pt was seen for mobility and strength testing, and was able to do supervised lateral scoot on the bed.  Her plan is to go home when permitted, has a stair goal but does not have to climb stairs.  Will follow acutely BID as able to give her a good basic tolerance and safety for mobility, and manage pain with ROM and ice as tolerated.      Follow Up Recommendations Home health PT;Supervision for mobility/OOB    Equipment Recommendations  None recommended by PT    Recommendations for Other Services       Precautions / Restrictions Precautions Precautions: Fall;Other (comment)(direct anterior approach) Restrictions Weight Bearing Restrictions: (P) No LLE Weight Bearing: (P) Weight bearing as tolerated      Mobility  Bed Mobility Overal bed mobility: Needs Assistance Bed Mobility: Supine to Sit;Sit to Supine     Supine to sit: Min assist Sit to supine: Min assist   General bed mobility comments: pt is demonstrating ability to help with her UE's and needs mainly help with L LE due to pain and surgery today  Transfers Overall transfer level: Needs assistance Equipment used: 1 person hand held assist Transfers: Lateral/Scoot Transfers          Lateral/Scoot Transfers: Min guard General transfer comment: scooted from center of bed to [facility]  Ambulation/Gait             General Gait Details: deferred due to numb LLE  Stairs            Wheelchair Mobility    Modified Rankin (Stroke Patients Only) Modified Rankin (Stroke Patients Only) Pre-Morbid Rankin Score: No  significant disability Modified Rankin: Moderate disability     Balance Overall balance assessment: Needs assistance Sitting-balance support: Feet supported Sitting balance-Leahy Scale: Fair                                       Pertinent Vitals/Pain Pain Assessment: 0-10 Pain Score: 7  Pain Location: L hip and thigh Pain Descriptors / Indicators: Operative site guarding Pain Intervention(s): Limited activity within patient's tolerance;Monitored during session;Premedicated before session;Repositioned;Ice applied    Home Living Family/patient expects to be discharged to:: Private residence Living Arrangements: Alone Available Help at Discharge: Family;Available 24 hours/day Type of Home: House Home Access: Level entry     Home Layout: One level Home Equipment: Walker - 2 wheels;Walker - 4 wheels;Cane - single point;Shower seat;Toilet riser Additional Comments: pt is up to side of bed with LLE still partially numb, and so just worked bedside    Prior Function Level of Independence: Independent               Hand Dominance   Dominant Hand: Right    Extremity/Trunk Assessment   Upper Extremity Assessment Upper Extremity Assessment: Overall WFL for tasks assessed    Lower Extremity Assessment Lower Extremity Assessment: LLE deficits/detail LLE Deficits / Details: hip 3- flexion and knee ext 3+. knee flexion 3-    Cervical / Trunk  Assessment Cervical / Trunk Assessment: Other exceptions(back is hurting as well as neck)  Communication   Communication: No difficulties  Cognition Arousal/Alertness: Awake/alert Behavior During Therapy: WFL for tasks assessed/performed Overall Cognitive Status: Within Functional Limits for tasks assessed                                        General Comments General comments (skin integrity, edema, etc.): pt is demonstrating ability to assist with all but standing which was withheld due to  numbness    Exercises     Assessment/Plan    PT Assessment Patient needs continued PT services  PT Problem List Decreased strength;Decreased range of motion;Decreased activity tolerance;Decreased balance;Decreased mobility;Decreased coordination;Decreased knowledge of use of DME;Decreased safety awareness;Obesity;Decreased skin integrity;Pain       PT Treatment Interventions DME instruction;Gait training;Stair training;Functional mobility training;Therapeutic activities;Therapeutic exercise;Balance training;Neuromuscular re-education;Patient/family education    PT Goals (Current goals can be found in the Care Plan section)  Acute Rehab PT Goals Patient Stated Goal: to get home and get comfortable with pain PT Goal Formulation: With patient Time For Goal Achievement: 12/03/18 Potential to Achieve Goals: Good    Frequency BID   Barriers to discharge Inaccessible home environment stairs to enter house    Co-evaluation               AM-PAC PT "6 Clicks" Mobility  Outcome Measure Help needed turning from your back to your side while in a flat bed without using bedrails?: A Little Help needed moving from lying on your back to sitting on the side of a flat bed without using bedrails?: A Little Help needed moving to and from a bed to a chair (including a wheelchair)?: A Little Help needed standing up from a chair using your arms (e.g., wheelchair or bedside chair)?: A Little Help needed to walk in hospital room?: A Lot Help needed climbing 3-5 steps with a railing? : A Lot 6 Click Score: 16    End of Session   Activity Tolerance: Patient limited by fatigue;Patient limited by pain Patient left: in bed;with call bell/phone within reach;with bed alarm set;with nursing/sitter in room Nurse Communication: Mobility status PT Visit Diagnosis: Unsteadiness on feet (R26.81);Muscle weakness (generalized) (M62.81);Pain Pain - Right/Left: Left Pain - part of body: Hip    Time:  8828-0034 PT Time Calculation (min) (ACUTE ONLY): 31 min   Charges:   PT Evaluation $PT Eval Moderate Complexity: 1 Mod PT Treatments $Therapeutic Activity: 8-22 mins       Ramond Dial 11/19/2018, 5:39 PM   Mee Hives, PT MS Acute Rehab Dept. Number: [place]

## 2018-11-19 NOTE — Op Note (Signed)
11/19/2018  1:12 PM  PATIENT:  [name]  63 y.o. female  PRE-OPERATIVE DIAGNOSIS:  PRIMARY OSTEOARTHRITIS OF LEFT HIP  POST-OPERATIVE DIAGNOSIS:  PRIMARY OSTEOARTHRITIS OF LEFT HIP  PROCEDURE:  Procedure(s): TOTAL HIP ARTHROPLASTY ANTERIOR APPROACH (Left)  SURGEON: Laurene Footman, MD  ASSISTANTS: none  ANESTHESIA:   spinal  EBL:  Total I/O In: -  Out: 50 [Blood:50]  BLOOD ADMINISTERED:none  DRAINS: none   LOCAL MEDICATIONS USED:  MARCAINE    and OTHER Exparel  SPECIMEN:  Source of Specimen:  Left femoral head  DISPOSITION OF SPECIMEN:  PATHOLOGY  COUNTS:  YES  TOURNIQUET:  * No tourniquets in log *  IMPLANTS: Medacta AMIS 2 standard stem, 50 mm Mpact DM cup and liner, 28 mm millimeters ceramic head  DICTATION: .Dragon Dictation   The patient was brought to the operating room and after spinal anesthesia was obtained patient was placed on the operative table with the ipsilateral foot into the Medacta attachment, contralateral leg on a well-padded table. C-arm was brought in and preop template x-ray taken. After prepping and draping in usual sterile fashion appropriate patient identification and timeout procedures were completed. Anterior approach to the hip was obtained and centered over the greater trochanter and TFL muscle. The subcutaneous tissue was incised hemostasis being achieved by electrocautery. TFL fascia was incised and the muscle retracted laterally deep retractor placed. The lateral femoral circumflex vessels were identified and ligated. The anterior capsule was exposed and a capsulotomy performed. The neck was identified and a femoral neck cut carried out with a saw. The head was removed without difficulty and showed sclerotic femoral head and acetabulum. Reaming was carried out to 50 mm and a 50 mm cup trial gave appropriate tightness to the acetabular component a 50 DM cup was impacted into position. The leg was then externally rotated and ischiofemoral and  pubofemoral releases carried out. The femur was sequentially broached to a size 2, size 2 standard stem with S and then M head trials were placed and the final components chosen. The 2 standard stem was inserted along with a ceramic M 28 mm head and 50 mm liner. The hip was reduced and was stable the wound was thoroughly irrigated with fibrillar placed along the posterior capsule and medial neck. The deep fascia ws closed using a heavy Quill after infiltration of 30 cc of quarter percent Sensorcaine with epinephrine. Exparel was injected throughout the case into the periarticular tissues 3-0 V-loc to close the skin with skin staples.  Incisional wound VAC applied, patient was sent to recovery in stable condition.   PLAN OF CARE: Admit to inpatient

## 2018-11-19 NOTE — Transfer of Care (Signed)
Immediate Anesthesia Transfer of Care Note  Patient: Michele Newton  Procedure(s) Performed: TOTAL HIP ARTHROPLASTY ANTERIOR APPROACH (Left )  Patient Location: PACU  Anesthesia Type:Spinal  Level of Consciousness: awake, alert  and oriented  Airway & Oxygen Therapy: Patient Spontanous Breathing  Post-op Assessment: Report given to RN and Post -op Vital signs reviewed and stable  Post vital signs: Reviewed and stable  Last Vitals:  Vitals Value Taken Time  BP    Temp    Pulse    Resp    SpO2      Last Pain:  Vitals:   11/19/18 0812  TempSrc: Temporal  PainSc: 5          Complications: No apparent anesthesia complications

## 2018-11-19 NOTE — Progress Notes (Signed)
Pt reports chronic neck and back pain related to arthritis. Upon arrival in to PACU, pt complained of back pain. Patient was repositioned (requested to lay flat), warm blankets applied. Pt requested pain medication. One dose given; pt reports it was ineffective and does not wish to have more medication. Dull pain in neck and back which pt reports as normal. She states cold typically helps symptoms. Ice pack applied to neck; repositioned patient per request. Patient appears to be resting more comfortably at this time.

## 2018-11-20 LAB — CBC
HCT: 34.5 % — ABNORMAL LOW (ref 36.0–46.0)
Hemoglobin: 11 g/dL — ABNORMAL LOW (ref 12.0–15.0)
MCH: 27.7 pg (ref 26.0–34.0)
MCHC: 31.9 g/dL (ref 30.0–36.0)
MCV: 86.9 fL (ref 80.0–100.0)
Platelets: 310 10*3/uL (ref 150–400)
RBC: 3.97 MIL/uL (ref 3.87–5.11)
RDW: 13 % (ref 11.5–15.5)
WBC: 8.3 10*3/uL (ref 4.0–10.5)
nRBC: 0 % (ref 0.0–0.2)

## 2018-11-20 LAB — BASIC METABOLIC PANEL
Anion gap: 7 (ref 5–15)
BUN: 12 mg/dL (ref 8–23)
CO2: 24 mmol/L (ref 22–32)
Calcium: 8.2 mg/dL — ABNORMAL LOW (ref 8.9–10.3)
Chloride: 106 mmol/L (ref 98–111)
Creatinine, Ser: 0.78 mg/dL (ref 0.44–1.00)
GFR calc Af Amer: >60 mL/min (ref >60)
GFR calc non Af Amer: >60 mL/min (ref >60)
Glucose, Bld: 110 mg/dL — ABNORMAL HIGH (ref 70–99)
Potassium: 3.5 mmol/L (ref 3.5–5.1)
Sodium: 137 mmol/L (ref 135–145)

## 2018-11-20 MED ORDER — METHOCARBAMOL 500 MG PO TABS: 500.0000 mg | ORAL_TABLET | Freq: Four times a day (QID) | ORAL | 0 refills | Status: DC | PRN

## 2018-11-20 MED ORDER — ENOXAPARIN SODIUM 40 MG/0.4ML ~~LOC~~ SOLN: 40.0000 mg | SUBCUTANEOUS | 0 refills | Status: DC

## 2018-11-20 MED ORDER — DOCUSATE SODIUM 100 MG PO CAPS: 100.0000 mg | ORAL_CAPSULE | Freq: Two times a day (BID) | ORAL | 0 refills | Status: DC

## 2018-11-20 MED ORDER — OXYCODONE HCL 5 MG PO TABS: 5.0000 mg | ORAL_TABLET | ORAL | 0 refills | Status: DC | PRN

## 2018-11-20 NOTE — TOC Initial Note (Addendum)
Transition of Care [facility]) - Initial/Assessment Note    Patient Details  Name: Michele Newton MRN: [id number] Date of Birth: August 26, 1955  Transition of [facility]) CM/SW Contact:    Alaylah Heatherington, Lenice Llamas Phone Number: [phone]  11/20/2018, 12:45 PM  Clinical Narrative: Clinical Social Worker (CSW) met with patient to discuss D/C plan. PT is recommending home health. Patient was alert and oriented X4 and was sitting up in bed. CSW introduced self and explained role of CSW department. Per patient she lives in [place] with her husband Michele Newton and her sister will be coming to stay with her. Patient reported that her plan is to D/C home and she has a rolling walker and rollaider at home. Patient requested a bedside commode. CSW sent [employer] agency representative an email making him aware of need for bedside commode. CSW explained that patient's surgeon prefers [facility] for home health. Patient is agreeable to [facility]. Helene Kelp [facility] home health representative is aware of above. CSW notified patient of her Lovenox price $7.04. Per patient she can afford the Lovenox.           Expected Discharge Plan: [place] Barriers to Discharge: Continued Medical Work up   Patient Goals and CMS Choice Patient states their goals for this hospitalization and ongoing recovery are:: Pain control   Choice offered to / list presented to : Patient  Expected Discharge Plan and Services Expected Discharge Plan: [place] In-house Referral: Clinical Social Work Discharge Planning Services: CM Consult Post Acute Care Choice: [place] arrangements for the past 2 months: Single Family Home                 DME Arranged: Bedside commode DME Agency: [employer] Date DME Agency Contacted: 11/20/18   Representative spoke with at DME Agency: [place]: PT [place]: [facility] [employer] Date [place]: 11/19/18    Representative spoke with at [place]: Helene Kelp  Prior Living Arrangements/Services Living arrangements for the past 2 months: [place] with:: Spouse Patient language and need for interpreter reviewed:: No Do you feel safe going back to the place where you live?: Yes      Need for Family Participation in Patient Care: No (Comment) Care giver support system in place?: Yes (comment) Current home services: DME(Patient has a rolling walker and a rollaider at home.) Criminal Activity/Legal Involvement Pertinent to Current Situation/Hospitalization: No - Comment as needed  Activities of Daily Living Home Assistive Devices/Equipment: Cane (specify quad or straight) ADL Screening (condition at time of admission) Patient's cognitive ability adequate to safely complete daily activities?: Yes Is the patient deaf or have difficulty hearing?: Yes Does the patient have difficulty seeing, even when wearing glasses/contacts?: No Does the patient have difficulty concentrating, remembering, or making decisions?: No Patient able to express need for assistance with ADLs?: Yes Does the patient have difficulty dressing or bathing?: No Independently performs ADLs?: Yes (appropriate for developmental age) Does the patient have difficulty walking or climbing stairs?: No Weakness of Legs: None Weakness of Arms/Hands: None  Permission Sought/Granted Permission sought to share information with : (Home Health and DME agency) Permission granted to share information with : Yes, Verbal Permission Granted              Emotional Assessment Appearance:: Appears stated age   Affect (typically observed): Pleasant, Calm Orientation: : Oriented to Self, Oriented to Place, Oriented to  Time, Oriented  to Situation Alcohol / Substance Use: Not Applicable Psych Involvement: No (comment)  Admission diagnosis:  PRIMARY OSTEOARTHRITIS OF LEFT HIP Patient Active Problem List   Diagnosis Date Noted  .  Status post total hip replacement, left 11/19/2018  . Other insomnia 02/06/2018  . Essential hypertension 02/06/2018  . Mild intermittent asthma without complication 88/33/7445  . Acute bursitis of left shoulder 05/20/2017  . Arthritis 04/08/2017  . Chronic midline low back pain without sciatica 04/08/2017  . Generalized osteoarthritis of hand 04/08/2017  . Psoriasis 04/08/2017  . Headache 12/03/2016  . Abdominal pain, left lower quadrant 03/12/2016  . Rhinitis, allergic 12/21/2015  . H/O cold sores 10/01/2015  . Cavus deformity of foot, acquired 08/08/2015  . Plantar fasciitis 08/08/2015  . 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 Right foot pain 07/28/2014  . GERD (gastroesophageal reflux disease) 07/09/2014  . Sleep disorder 07/09/2014  . Back pain 07/27/2013  . Hip pain, left 07/27/2013  . Left knee pain 07/27/2013  . Psoriatic arthritis ([place]) 07/27/2013  . Anxiety and depression 06/11/2013  . Weight gain 02/11/2013  . Screening for breast cancer 02/11/2013   PCP:  Mikey College, NP Pharmacy:   [facility] [phone] - [address], [address] [place] [address] Phone: [phone] Fax: [phone]  [id number] [address], [address] [place] [address] Phone: [phone] Fax: [phone]     Social Determinants of Health (SDOH) Interventions    Readmission Risk Interventions No flowsheet data found.

## 2018-11-20 NOTE — Discharge Instructions (Signed)

## 2018-11-20 NOTE — TOC Benefit Eligibility Note (Signed)
Transition of Care [facility]) Benefit Eligibility Note    Patient Details  Name: Michele Newton MRN: [id number] Date of Birth: Feb 03, 1956   Medication/Dose: Lovenox 51m once daily for 14 days  Covered?: No  Prescription Coverage Preferred Pharmacy: [facility]  Spoke with Person/Company/Phone Number:: AEstill Battenwith [employer] at [phone] Prior Approval: Yes(PA required for name brand: [phone](phone) or [phone](fax))  Deductible: Unmet($435 deductible, $342.44 met as of time of call.)  Additional Notes: Generic Enoxaparin covered with no PA required.  Estimated copay for 30 day supply: $7.04    HDannette BarbaraPhone Number:  [phone] [phone]/17/2020, 2:46 PM

## 2018-11-20 NOTE — Anesthesia Postprocedure Evaluation (Signed)
Anesthesia Post Note  Patient: Michele Newton  Procedure(s) Performed: TOTAL HIP ARTHROPLASTY ANTERIOR APPROACH (Left )  Patient location during evaluation: Nursing Unit Anesthesia Type: Spinal Level of consciousness: oriented and awake and alert Pain management: pain level controlled Vital Signs Assessment: post-procedure vital signs reviewed and stable Respiratory status: spontaneous breathing and respiratory function stable Cardiovascular status: blood pressure returned to baseline and stable Postop Assessment: no headache, no backache, no apparent nausea or vomiting and patient able to bend at knees Anesthetic complications: no     Last Vitals:  Vitals:   11/19/18 2331 11/20/18 0818  BP: 123/83 108/75  Pulse: (!) 101 80  Resp: 18   Temp: 37.3 C 36.6 C  SpO2: 97% 97%    Last Pain:  Vitals:   11/20/18 0735  TempSrc:   PainSc: 4                  Tanner Vigna L Rondle Lohse

## 2018-11-20 NOTE — Discharge Summary (Signed)
Physician Discharge Summary  Patient ID: Michele Newton MRN: [id number] DOB/AGE: 1955/07/01 63 y.o.  Admit date: 11/19/2018 Discharge date: 11/21/2018 Admission Diagnoses:  PRIMARY OSTEOARTHRITIS OF LEFT HIP   Discharge Diagnoses: Patient Active Problem List   Diagnosis Date Noted  . Status post total hip replacement, left 11/19/2018  . Other insomnia 02/06/2018  . Essential hypertension 02/06/2018  . Mild intermittent asthma without complication 78/24/2353  . Acute bursitis of left shoulder 05/20/2017  . Arthritis 04/08/2017  . Chronic midline low back pain without sciatica 04/08/2017  . Generalized osteoarthritis of hand 04/08/2017  . Psoriasis 04/08/2017  . Headache 12/03/2016  . Abdominal pain, left lower quadrant 03/12/2016  . Rhinitis, allergic 12/21/2015  . H/O cold sores 10/01/2015  . Cavus deformity of foot, acquired 08/08/2015  . Plantar fasciitis 08/08/2015  . 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 Right foot pain 07/28/2014  . GERD (gastroesophageal reflux disease) 07/09/2014  . Sleep disorder 07/09/2014  . Back pain 07/27/2013  . Hip pain, left 07/27/2013  . Left knee pain 07/27/2013  . Psoriatic arthritis ([place]) 07/27/2013  . Anxiety and depression 06/11/2013  . Weight gain 02/11/2013  . Screening for breast cancer 02/11/2013    Past Medical History:  Diagnosis Date  . Anxiety   . Arthritis   . Asthma   . Depression   . Frequent headaches   . GERD (gastroesophageal reflux disease)   . Migraine   . Psoriatic arthritis ([place])      Transfusion: NONE   Consultants (if any):   Discharged Condition: Improved  Hospital Course: Michele Newton is an 63 y.o. female who was admitted 11/19/2018 with a diagnosis of left hip osteoarthritis and went to the operating room on 11/19/2018 and underwent the above named procedures.    Surgeries: Procedure(s): TOTAL HIP ARTHROPLASTY ANTERIOR APPROACH on 11/19/2018 Patient tolerated the surgery well. Taken to PACU where she was  stabilized and then transferred to the orthopedic floor.  Started on Lovenox 40 mg q 24  hrs. Foot pumps applied bilaterally at 80 mm. Heels elevated on bed with rolled towels. No evidence of DVT. Negative Homan. Physical therapy started on day #1 for gait training and transfer. OT started day #1 for ADL and assisted devices.  Patient's foley was d/c on day #1. Patient's IV was d/c on day #2.  On post op day #2 patient was stable and ready for discharge to home with HHPT.  Implants: Medacta AMIS 2 standard stem, 50 mm Mpact DM cup and liner, 28 mm millimeters ceramic head  She was given perioperative antibiotics:  Anti-infectives (From admission, onward)   Start     Dose/Rate Route Frequency Ordered Stop   11/19/18 1730  ceFAZolin (ANCEF) IVPB 2g/100 mL premix     2 g 200 mL/hr over 30 Minutes Intravenous Every 6 hours 11/19/18 1501 11/19/18 2359   11/19/18 0802  ceFAZolin (ANCEF) 2-4 GM/100ML-% IVPB    Note to Pharmacy: Myles Lipps   : cabinet override      11/19/18 0802 11/19/18 1147   11/19/18 0600  ceFAZolin (ANCEF) IVPB 2g/100 mL premix     2 g 200 mL/hr over 30 Minutes Intravenous  Once 11/19/18 0559 11/19/18 1147    .  She was given sequential compression devices, early ambulation, and Lovenox TEDs for DVT prophylaxis.  She benefited maximally from the hospital stay and there were no complications.    Recent vital signs:  Vitals:   11/20/18 1557 11/20/18 2309  BP: (!) 146/82 (!) 143/80  Pulse: 87 98  Resp:  16  Temp: 97.9 F (36.6 C) 99.1 F (37.3 C)  SpO2: 98% 95%    Recent laboratory studies:  Lab Results  Component Value Date   HGB 11.0 (L) 11/20/2018   HGB 13.3 11/19/2018   HGB 15.2 (H) 11/11/2018   Lab Results  Component Value Date   WBC 8.3 11/20/2018   PLT 310 11/20/2018   Lab Results  Component Value Date   INR 1.0 11/11/2018   Lab Results  Component Value Date   NA 137 11/20/2018   K 3.5 11/20/2018   CL 106 11/20/2018   CO2 24  11/20/2018   BUN 12 11/20/2018   CREATININE 0.78 11/20/2018   GLUCOSE 110 (H) 11/20/2018    Discharge Medications:   Allergies as of 11/21/2018      Reactions   Amitriptyline Other (See Comments)   hallucinations   Effexor [venlafaxine] Other (See Comments)   Amnesia, hospitalized x 2 weeks   Haldol [haloperidol] Other (See Comments)   Paralyzed vocal cords       Medication List    STOP taking these medications   cyclobenzaprine 5 MG tablet Commonly known as: FLEXERIL   diclofenac 75 MG EC tablet Commonly known as: VOLTAREN   HYDROcodone-acetaminophen 5-325 MG tablet Commonly known as: NORCO/VICODIN   naproxen sodium 220 MG tablet Commonly known as: ALEVE     TAKE these medications   albuterol 108 (90 Base) MCG/ACT inhaler Commonly known as: VENTOLIN HFA Inhale 2 puffs into the lungs every 6 (six) hours as needed for wheezing or shortness of breath.   docusate sodium 100 MG capsule Commonly known as: COLACE Take 1 capsule (100 mg total) by mouth 2 (two) times daily.   enoxaparin 40 MG/0.4ML injection Commonly known as: LOVENOX Inject 0.4 mLs (40 mg total) into the skin daily for 14 days.   fluticasone 50 MCG/ACT nasal spray Commonly known as: FLONASE Place 2 sprays into both nostrils daily. What changed:   when to take this  reasons to take this   methocarbamol 500 MG tablet Commonly known as: ROBAXIN Take 1 tablet (500 mg total) by mouth every 6 (six) hours as needed for muscle spasms.   montelukast 10 MG tablet Commonly known as: SINGULAIR TK 1 T PO HS What changed:   how much to take  how to take this  when to take this  additional instructions   oxyCODONE 5 MG immediate release tablet Commonly known as: Oxy IR/ROXICODONE Take 1-2 tablets (5-10 mg total) by mouth every 4 (four) hours as needed for moderate pain (pain score 4-6).   pantoprazole 40 MG tablet Commonly known as: PROTONIX TAKE 1 TABLET(40 MG) BY MOUTH TWICE DAILY What  changed: See the new instructions.   PARoxetine 40 MG tablet Commonly known as: PAXIL TAKE 1 TABLET BY MOUTH DAILY What changed: when to take this   QUEtiapine 200 MG tablet Commonly known as: SEROQUEL TAKE 1 TABLET(200 MG) BY MOUTH AT BEDTIME What changed: See the new instructions.   valsartan 160 MG tablet Commonly known as: DIOVAN valsartan 160 mg tablet            Durable Medical Equipment  (From admission, onward)         Start     Ordered   11/19/18 1502  DME Walker rolling  Once    Question:  Patient needs a walker to treat with the following condition  Answer:  Status post total hip replacement, left  11/19/18 1501   11/19/18 1502  DME 3 n 1  Once     11/19/18 1501   11/19/18 1502  DME Bedside commode  Once    Question:  Patient needs a bedside commode to treat with the following condition  Answer:  Status post total hip replacement, left   11/19/18 1501          Diagnostic Studies: Dg Hip Operative Unilat W Or W/o Pelvis Left  Result Date: 11/19/2018 CLINICAL DATA:  Left hip replacement EXAM: OPERATIVE left HIP (WITH PELVIS IF PERFORMED) 3 VIEWS TECHNIQUE: Fluoroscopic spot image(s) were submitted for interpretation post-operatively. COMPARISON:  MRI left hip 04/08/2018 FINDINGS: Left hip replacement in satisfactory position and alignment. No acute complication. Limited bony evaluation IMPRESSION: Satisfactory left hip replacement Electronically Signed   By: Franchot Gallo M.D.   On: 11/19/2018 13:18   Dg Hip Unilat W Or W/o Pelvis 2-3 Views Left  Result Date: 11/19/2018 CLINICAL DATA:  Status post left hip replacement. EXAM: DG HIP (WITH OR WITHOUT PELVIS) 2-3V LEFT COMPARISON:  MRI of April 08, 2018. FINDINGS: The left femoral and acetabular components appear to be well situated. Expected postoperative changes are noted in the surrounding soft tissues. No fracture or dislocation is noted. IMPRESSION: Status post left total hip arthroplasty. Electronically  Signed   By: Marijo Conception M.D.   On: 11/19/2018 14:01    Disposition: Discharge disposition: 01-Home or Self Care         Follow-up Information    Duanne Guess, PA-C Follow up in 2 week(s).   Specialties: Orthopedic Surgery, Emergency Medicine Contact information: [place] [address] [phone]            Signed: Reche Dixon 11/21/2018, 7:13 AM

## 2018-11-20 NOTE — Progress Notes (Signed)
   Subjective: 1 Day Post-Op Procedure(s) (LRB): TOTAL HIP ARTHROPLASTY ANTERIOR APPROACH (Left) Patient reports pain as mild.   Patient is well, and has had no acute complaints or problems Denies any CP, SOB, ABD pain. We will continue therapy today.  Plan is to go Home after hospital stay.  Objective: Vital signs in last 24 hours: Temp:  [97.5 F (36.4 C)-99.1 F (37.3 C)] 99.1 F (37.3 C) (07/16 2331) Pulse Rate:  [60-101] 101 (07/16 2331) Resp:  [11-22] 18 (07/16 2331) BP: (95-145)/(59-90) 123/83 (07/16 2331) SpO2:  [95 %-100 %] 97 % (07/16 2331) Weight:  [91 kg] 91 kg (07/16 0812)  Intake/Output from previous day: 07/16 0701 - 07/17 0700 In: 2096.2 [P.O.:370; I.V.:1526.2; IV Piggyback:200] Out: 570 [Urine:520; Blood:50] Intake/Output this shift: No intake/output data recorded.  Recent Labs    11/19/18 1519 11/20/18 0408  HGB 13.3 11.0*   Recent Labs    11/19/18 1519 11/20/18 0408  WBC 12.3* 8.3  RBC 4.71 3.97  HCT 41.3 34.5*  PLT 389 310   Recent Labs    11/19/18 1519 11/20/18 0408  NA  --  137  K  --  3.5  CL  --  106  CO2  --  24  BUN  --  12  CREATININE 0.82 0.78  GLUCOSE  --  110*  CALCIUM  --  8.2*   No results for input(s): LABPT, INR in the last 72 hours.  EXAM General - Patient is Alert, Appropriate and Oriented Extremity - Neurovascular intact Sensation intact distally Intact pulses distally Dorsiflexion/Plantar flexion intact No cellulitis present Compartment soft Dressing - dressing C/D/I and no drainage, prevena intact with out drainage Motor Function - intact, moving foot and toes well on exam.   Past Medical History:  Diagnosis Date  . Anxiety   . Arthritis   . Asthma   . Depression   . Frequent headaches   . GERD (gastroesophageal reflux disease)   . Migraine   . Psoriatic arthritis ([place])     Assessment/Plan:   1 Day Post-Op Procedure(s) (LRB): TOTAL HIP ARTHROPLASTY ANTERIOR APPROACH (Left) Active Problems:  Status post total hip replacement, left  Estimated body mass index is 32.38 kg/m as calculated from the following:   Height as of this encounter: 5\' 6"  (1.676 m).   Weight as of this encounter: 91 kg. Advance diet Up with therapy  Needs BM Labs stable VSS - Low grade temp. encouraged incentive spirometer.  Pain well controlled CM to assist with discharge   DVT Prophylaxis - Lovenox, TED hose and SCDs Weight-Bearing as tolerated to left leg   T. Rachelle Hora, PA-C [place] 11/20/2018, 8:07 AM

## 2018-11-20 NOTE — Progress Notes (Signed)
Physical Therapy Treatment Patient Details Name: Michele Newton MRN: [id number] DOB: [birth year] Today's Date: 11/20/2018    History of Present Illness 63 yo female with onset of L hip pain with OA and failed conservative measures was admitted for L THA with direct anterior approach and is WBAT.  PMHx:  CAD, MI, chest pain, myocardial ischemia, CHF, OA, (+) exercise test, CVA, DM,     PT Comments    Pt requested to do bed exercises, just having returned to bed from working with OT and sitting up for the majority of the morning when PT arrived.   She had reported 8/10 pain in her L hip and was waiting for pain medication to take effect.  Pt able to perform supine bed exercises with education and did require tactile cues to activate quads during SAQ.  PT took time to educate regarding management of HEP and recovery timeline following discharge and pt was open to all information.  PT also reviewed hip precautions related to anterior approach THA.  Pt will continue to benefit from skilled PT with focus on strength, pain management, tolerance to activity and safe functional mobility.  Pt appropriate for home with home health services following discharge.  Follow Up Recommendations  Home health PT;Supervision for mobility/OOB     Equipment Recommendations  None recommended by PT    Recommendations for Other Services       Precautions / Restrictions Precautions Precautions: Fall;Other (comment)(direct anterior approach) Restrictions Weight Bearing Restrictions: Yes LLE Weight Bearing: Weight bearing as tolerated    Mobility  Bed Mobility Overal bed mobility: (Did not perform due to pt having just returned to bed wit OT.)                Transfers                    Ambulation/Gait                 Stairs             Wheelchair Mobility    Modified Rankin (Stroke Patients Only)       Balance                                             Cognition Arousal/Alertness: Awake/alert Behavior During Therapy: WFL for tasks assessed/performed Overall Cognitive Status: Within Functional Limits for tasks assessed                                        Exercises Total Joint Exercises Ankle Circles/Pumps: 20 reps;Both;Supine;Strengthening Quad Sets: Strengthening;Left;10 reps;Supine Gluteal Sets: Both;10 reps;Supine;Strengthening Towel Squeeze: Strengthening;Both;10 reps;Supine Short Arc Quad: Strengthening;Left;10 reps;Supine Heel Slides: AAROM;10 reps;Supine;Left Hip ABduction/ADduction: AAROM;Left;10 reps;Supine Other Exercises Other Exercises: Education regarding management of HEP and home health PT.  Pt has a pool at home and had questions about aquatic therapeutic exercise which pt discussed as a possibility following clearance from physician. x7 min Other Exercises: Time to review HEP handout and answer questions. x3 min    General Comments        Pertinent Vitals/Pain Pain Assessment: Faces Faces Pain Scale: Hurts little more Pain Location: L hip surrounding surgical site. Pain Descriptors / Indicators: Aching;Grimacing Pain Intervention(s): Premedicated before session;Monitored during session    Home Living  Prior Function            PT Goals (current goals can now be found in the care plan section) Acute Rehab PT Goals Patient Stated Goal: to get home and get comfortable with pain PT Goal Formulation: With patient Time For Goal Achievement: 12/03/18 Potential to Achieve Goals: Good Progress towards PT goals: Progressing toward goals    Frequency    BID      PT Plan Current plan remains appropriate    Co-evaluation              AM-PAC PT "6 Clicks" Mobility   Outcome Measure  Help needed turning from your back to your side while in a flat bed without using bedrails?: A Little Help needed moving from lying on your back to sitting on the side  of a flat bed without using bedrails?: A Little Help needed moving to and from a bed to a chair (including a wheelchair)?: A Little Help needed standing up from a chair using your arms (e.g., wheelchair or bedside chair)?: A Little Help needed to walk in hospital room?: A Lot Help needed climbing 3-5 steps with a railing? : A Lot 6 Click Score: 16    End of Session   Activity Tolerance: Patient limited by pain Patient left: in bed;with call bell/phone within reach;with bed alarm set;with nursing/sitter in room Nurse Communication: Mobility status PT Visit Diagnosis: Unsteadiness on feet (R26.81);Muscle weakness (generalized) (M62.81);Pain Pain - Right/Left: Left Pain - part of body: Hip     Time: 4268-3419 PT Time Calculation (min) (ACUTE ONLY): 24 min  Charges:  $Therapeutic Exercise: 8-22 mins                     Roxanne Gates, PT, DPT    Roxanne Gates 11/20/2018, 11:28 AM

## 2018-11-20 NOTE — Evaluation (Signed)
Occupational Therapy Evaluation Patient Details Name: Michele Newton MRN: [id number] DOB: 05-01-1956 Today's Date: 11/20/2018    History of Present Illness 63 yo female with onset of L hip pain with OA and failed conservative measures was admitted for L THA with direct anterior approach and is WBAT.  PMHx:  CAD, MI, chest pain, myocardial ischemia, CHF, OA, (+) exercise test, CVA, DM,    Clinical Impression   Pt seen for OT evaluation this date, POD#1 from above surgery. Pt was independent in all ADLs prior to surgery, however occasionally using SPC or 2WW for mobility due to L hip pain. Pt is eager to return to PLOF with less pain and improved safety and independence. During this session she stated her goal is to be able to walk her dog again without pain which she has been unable to do for the past 6 months. Pt currently requires moderate assist for LB dressing and bathing while in seated position due to pain and limited AROM of L hip. Pt instructed in self care skills, falls prevention strategies, home/routines modifications, DME/AE for LB bathing and dressing tasks, and compression stocking mgt strategies. Pt would benefit from additional instruction in self care skills and techniques to help maintain precautions with or without assistive devices to support recall and carryover prior to discharge. Recommend HHOT upon discharge.      Follow Up Recommendations  Home health OT;Supervision - Intermittent    Equipment Recommendations  3 in 1 bedside commode    Recommendations for Other Services       Precautions / Restrictions Precautions Precautions: Fall;Other (comment);Anterior Hip Restrictions Weight Bearing Restrictions: Yes LLE Weight Bearing: Weight bearing as tolerated      Mobility Bed Mobility Overal bed mobility: Needs Assistance Bed Mobility: Supine to Sit;Sit to Supine     Supine to sit: Min assist     General bed mobility comments: Deferred at pt request. Pt remained  supine in bed. Per chart review pt required min assist for sup<>sit POD #0 and RN reports she has been up to chair with min assist.  Transfers Overall transfer level: Needs assistance               General transfer comment: Deferred 2/2 pt pain. Will continue to assess.    Balance Overall balance assessment: Needs assistance                                         ADL either performed or assessed with clinical judgement   ADL Overall ADL's : Needs assistance/impaired                                       General ADL Comments: ADL assessment limited 2/2 pain. Pt declined to attempt trial of AE for LB dressing or participate in ADL at EOB. Remained supine throughout. RN reports pt has been up to chair and is moving well, and pt independent at baseline. Suspect mod assist for LB ADL in seated position due to pain and limited ROM of the Left hip.     Vision Baseline Vision/History: Wears glasses Wears Glasses: Reading only Patient Visual Report: No change from baseline       Perception     Praxis      Pertinent Vitals/Pain Pain Assessment: Faces Pain Score: 8  Faces Pain Scale: Hurts little more Pain Location: L hip surrounding surgical site. Pain Descriptors / Indicators: Aching;Grimacing Pain Intervention(s): Limited activity within patient's tolerance;Monitored during session;RN gave pain meds during session     Hand Dominance Right   Extremity/Trunk Assessment Upper Extremity Assessment Upper Extremity Assessment: Overall WFL for tasks assessed   Lower Extremity Assessment Lower Extremity Assessment: LLE deficits/detail;Defer to PT evaluation LLE Deficits / Details: s/p L THA LLE: Unable to fully assess due to pain       Communication Communication Communication: No difficulties   Cognition Arousal/Alertness: Awake/alert Behavior During Therapy: WFL for tasks assessed/performed Overall Cognitive Status: Within Functional  Limits for tasks assessed                                     General Comments       Exercises Total Joint Exercises Ankle Circles/Pumps: 20 reps;Both;Supine;Strengthening Quad Sets: Strengthening;Left;10 reps;Supine Gluteal Sets: Both;10 reps;Supine;Strengthening Towel Squeeze: Strengthening;Both;10 reps;Supine Short Arc Quad: Strengthening;Left;10 reps;Supine Heel Slides: AAROM;10 reps;Supine;Left Hip ABduction/ADduction: AAROM;Left;10 reps;Supine Other Exercises Other Exercises: Education regarding management of HEP and home health PT.  Pt has a pool at home and had questions about aquatic therapeutic exercise which pt discussed as a possibility following clearance from physician. x7 min Other Exercises: Time to review HEP handout and answer questions. x3 min Other Exercises: Pt educated falls prevention, pet care mgt, safe use of AE for LB ADL tasks, compression stocking mgt, and considerations for ADL mgt with direct anterior approach. Pt verbalized understanding and handout provided, but would benefit from additional education in AE for LB ADL with trials.   Shoulder Instructions      Home Living   Living Arrangements: Alone Available Help at Discharge: Family;Available 24 hours/day Type of Home: House Home Access: Level entry     Home Layout: One level         Bathroom Toilet: Handicapped height     Home Equipment: Walker - 2 wheels;Walker - 4 wheels;Cane - single point;Shower seat;Toilet riser          Prior Functioning/Environment Level of Independence: Independent                 OT Problem List: Decreased strength;Pain;Decreased range of motion;Decreased activity tolerance;Decreased safety awareness;Decreased knowledge of use of DME or AE;Decreased knowledge of precautions;Impaired balance (sitting and/or standing)      OT Treatment/Interventions: Self-care/ADL training;Balance training;Therapeutic exercise;Therapeutic activities;DME  and/or AE instruction;Patient/family education    OT Goals(Current goals can be found in the care plan section) Acute Rehab OT Goals Patient Stated Goal: "To be able to walk my dog again without pain." OT Goal Formulation: With patient Time For Goal Achievement: 12/04/18 Potential to Achieve Goals: Good ADL Goals Pt Will Perform Lower Body Bathing: with set-up;with supervision;sit to/from stand(With LRAD PRN for improved safety and functional independence.) Pt Will Perform Lower Body Dressing: with set-up;with supervision;sit to/from stand(With LRAD PRN for improved safety and functional independence.) Pt Will Transfer to Toilet: with set-up;with supervision;ambulating;regular height toilet(With LRAD PRN for improved safety and functional independence.)  OT Frequency: Min 1X/week   Barriers to D/C:            Co-evaluation              AM-PAC OT "6 Clicks" Daily Activity     Outcome Measure Help from another person eating meals?: A Little Help from another person taking care of  personal grooming?: A Little Help from another person toileting, which includes using toliet, bedpan, or urinal?: A Little Help from another person bathing (including washing, rinsing, drying)?: A Lot Help from another person to put on and taking off regular upper body clothing?: A Little Help from another person to put on and taking off regular lower body clothing?: A Lot 6 Click Score: 16   End of Session    Activity Tolerance: Patient limited by pain Patient left: in bed;with call bell/phone within reach;with bed alarm set  OT Visit Diagnosis: Other abnormalities of gait and mobility (R26.89);Pain Pain - Right/Left: Left Pain - part of body: Hip                Time: 1020-1048 OT Time Calculation (min): 28 min Charges:  OT General Charges $OT Visit: 1 Visit OT Evaluation $OT Eval Low Complexity: 1 Low OT Treatments $Self Care/Home Management : 8-22 mins  Shara Blazing, M.S.,  OTR/L Ascom: [phone] 11/20/18, 12:32 PM

## 2018-11-20 NOTE — Progress Notes (Signed)
Physical Therapy Treatment Patient Details Name: Michele Newton MRN: [id number] DOB: 1955-08-02 Today's Date: 11/20/2018    History of Present Illness 63 yo female with onset of L hip pain with OA and failed conservative measures was admitted for L THA with direct anterior approach and is WBAT.  PMHx:  CAD, MI, chest pain, myocardial ischemia, CHF, OA, (+) exercise test, CVA, DM,     PT Comments    Pt able to progress walking distance to 80 ft with RW.  She presented with an antalgic gait and did experience knee buckling on two occasions which appeared to be more related to pain than mechanical difficulty.  Pt able to complete all seated there ex with manual and VC's and appears to have good strength overall.  Pt reported 5/10 pain at end of session and RN was notified of pt need for pain meds.  Pt will continue to benefit from skilled PT with focus on strength, tolerance to activity, pain management and safe functional mobility.   Follow Up Recommendations  Home health PT;Supervision for mobility/OOB     Equipment Recommendations  None recommended by PT    Recommendations for Other Services       Precautions / Restrictions Precautions Precautions: Fall;Other (comment);Anterior Hip Precaution Booklet Issued: Yes (comment) Restrictions Weight Bearing Restrictions: Yes LLE Weight Bearing: Weight bearing as tolerated    Mobility  Bed Mobility Overal bed mobility: (No bed mobility performed.) Bed Mobility: Supine to Sit;Sit to Supine     Supine to sit: Min assist     General bed mobility comments: Deferred at pt request. Pt remained supine in bed. Per chart review pt required min assist for sup<>sit POD #0 and RN reports she has been up to chair with min assist.  Transfers Overall transfer level: Needs assistance Equipment used: Rolling walker (2 wheeled) Transfers: Sit to/from Stand Sit to Stand: Supervision         General transfer comment: Able to remember safety  precautions to take when standing/sitting in chair.  Uses UE's for controlled lowering.  Ambulation/Gait Ambulation/Gait assistance: Min guard Gait Distance (Feet): 80 Feet Assistive device: Rolling walker (2 wheeled)     Gait velocity interpretation: 1.31 - 2.62 ft/sec, indicative of limited community ambulator General Gait Details: Antalgic gait, fair foot clearance, pt experienced sharp pain increase in L hip on two occasions which caused her knee to buckle.  PT returned with pt to room with no difficulty.   Stairs             Wheelchair Mobility    Modified Rankin (Stroke Patients Only)       Balance Overall balance assessment: Needs assistance Sitting-balance support: Feet supported Sitting balance-Leahy Scale: Good     Standing balance support: Bilateral upper extremity supported Standing balance-Leahy Scale: Fair                              Cognition Arousal/Alertness: Awake/alert Behavior During Therapy: WFL for tasks assessed/performed Overall Cognitive Status: Within Functional Limits for tasks assessed                                        Exercises Other Exercises Other Exercises: Time to review HEP and answer questions.  Pt performed pillow squeezes, LAQ's, quad sets, hip abduction x10 min Other Exercises: Pt educated falls prevention, pet care mgt,  safe use of AE for LB ADL tasks, compression stocking mgt, and considerations for ADL mgt with direct anterior approach. Pt verbalized understanding and handout provided, but would benefit from additional education in AE for LB ADL with trials.    General Comments        Pertinent Vitals/Pain Pain Assessment: 0-10 Pain Score: 8  Pain Location: L hip surrounding surgical site. Pain Descriptors / Indicators: Aching;Grimacing Pain Intervention(s): Monitored during session    Home Living                      Prior Function            PT Goals (current goals  can now be found in the care plan section) Acute Rehab PT Goals Patient Stated Goal: "To be able to walk my dog again without pain." Progress towards PT goals: Progressing toward goals    Frequency    BID      PT Plan Current plan remains appropriate    Co-evaluation              AM-PAC PT "6 Clicks" Mobility   Outcome Measure  Help needed turning from your back to your side while in a flat bed without using bedrails?: A Little Help needed moving from lying on your back to sitting on the side of a flat bed without using bedrails?: A Little Help needed moving to and from a bed to a chair (including a wheelchair)?: A Little Help needed standing up from a chair using your arms (e.g., wheelchair or bedside chair)?: A Little Help needed to walk in hospital room?: A Lot Help needed climbing 3-5 steps with a railing? : A Lot 6 Click Score: 16    End of Session Equipment Utilized During Treatment: Gait belt Activity Tolerance: Patient tolerated treatment well Patient left: in chair;with chair alarm set;with call bell/phone within reach Nurse Communication: Mobility status PT Visit Diagnosis: Unsteadiness on feet (R26.81);Muscle weakness (generalized) (M62.81);Pain Pain - Right/Left: Left Pain - part of body: Hip     Time: 1510-1537 PT Time Calculation (min) (ACUTE ONLY): 27 min  Charges:  $Therapeutic Exercise: 8-22 mins $Therapeutic Activity: 8-22 mins                     Roxanne Gates, PT, DPT    Roxanne Gates 11/20/2018, 4:07 PM

## 2018-11-21 NOTE — Progress Notes (Signed)
Pt says she would like to wash her hair, but she only has dry shampoo.  I offered a shampoo cap for her, but she refused and states she will wait until Dr. [name] sees her to see if she is going home or not.

## 2018-11-21 NOTE — TOC Transition Note (Signed)
Transition of Care [facility]) - CM/SW Discharge Note   Patient Details  Name: Michele Newton MRN: [id number] Date of Birth: [birth year]  [facility] CM/SW Contact:  Latanya Maudlin, RN Phone Number: 11/21/2018, 8:34 AM   Clinical Narrative:   Patient to be discharged per MD order. Orders in place for home health services. Previous TOC team member established home health with [facility]. Notified Helene Kelp of discharge. Brad with [employer] has been able to deliver DME per patient it has arrived at the home. Family to transport.    Final next level of care: [place] Barriers to Discharge: No Barriers Identified   Patient Goals and CMS Choice Patient states their goals for this hospitalization and ongoing recovery are:: Pain control CMS Medicare.gov Compare Post Acute Care list provided to:: Patient Choice offered to / list presented to : Patient  Discharge Placement                       Discharge Plan and Services In-house Referral: Clinical Social Work Discharge Planning Services: CM Consult Post Acute Care Choice: Home Health          DME Arranged: Bedside commode DME Agency: [employer] Date DME Agency Contacted: 11/21/18 Time DME Agency Contacted: [phone] Representative spoke with at DME Agency: [place]: PT [place]: [facility] Date [place]: 11/21/18 Time [place]: [phone] Representative spoke with at [facility]: [place] ([place]) Interventions     Readmission Risk Interventions Readmission Risk Prevention Plan 11/21/2018  Post Dischage Appt Complete  Medication Screening Complete  Transportation Screening Complete  Some recent data might be hidden

## 2018-11-21 NOTE — Progress Notes (Signed)
Physical Therapy Treatment Patient Details Name: Michele Newton MRN: [id number] DOB: [birth year] Today's Date: 11/21/2018    History of Present Illness 63 yo female with onset of L hip pain with OA and failed conservative measures was admitted for L THA with direct anterior approach and is WBAT.  PMHx:  CAD, MI, chest pain, myocardial ischemia, CHF, OA, (+) exercise test, CVA, DM,     PT Comments    Pt in recliner.  Reports pain but does not want pain medication yet and wants to proceed with therapy. Stood with supervision and begins walking with irregular step to gait pattern often moving R LE before left.  Verbal cues for gait pattern and general safety.  She fatigues after 100' and needs to sit.  Stair training complete with min guard and verbal cues.  Overall does well.  Returned to room she walks 10' from wheelchair to recliner with min guard.  Again needing verbal cues for gait pattern and to slow down.  Extensive education regarding gait pattern, safety and overall awareness with gait.  Encouraged +1 assist with mobility initially upon discharge.  Stated her sister, a retired Therapist, sports will be staying with her upon discharge.  Pt with no further questions or concerns.  Verbal review of HEP as she was fatigued and began eating breakfast as soon as she sat down.   Follow Up Recommendations  Home health PT;Supervision for mobility/OOB     Equipment Recommendations       Recommendations for Other Services       Precautions / Restrictions Precautions Precautions: Fall;Other (comment);Anterior Hip Restrictions Weight Bearing Restrictions: Yes LLE Weight Bearing: Weight bearing as tolerated    Mobility  Bed Mobility               General bed mobility comments: in recliner  Transfers Overall transfer level: Needs assistance Equipment used: Rolling walker (2 wheeled) Transfers: Sit to/from Stand Sit to Stand: Supervision            Ambulation/Gait Ambulation/Gait assistance:  Min guard Gait Distance (Feet): 100 Feet Assistive device: Rolling walker (2 wheeled) Gait Pattern/deviations: Step-to pattern Gait velocity: decreased   General Gait Details: verbal cues for general safety, slow down and sequening   Stairs             Wheelchair Mobility    Modified Rankin (Stroke Patients Only)       Balance Overall balance assessment: Needs assistance Sitting-balance support: Feet supported Sitting balance-Leahy Scale: Good     Standing balance support: Bilateral upper extremity supported Standing balance-Leahy Scale: Fair                              Cognition Arousal/Alertness: Awake/alert Behavior During Therapy: WFL for tasks assessed/performed Overall Cognitive Status: Within Functional Limits for tasks assessed                                        Exercises Other Exercises Other Exercises: Reviewed HEP and general safety.  Pt stood too quickly and began walking before HEP could be done and was too tired upon return and began eating breakfast.    General Comments        Pertinent Vitals/Pain Pain Assessment: Faces Faces Pain Scale: Hurts even more Pain Location: L hip surrounding surgical site. - declines pain medication until breakfast but want to "  get it over with." Pain Descriptors / Indicators: Aching;Grimacing Pain Intervention(s): Monitored during session    Home Living                      Prior Function            PT Goals (current goals can now be found in the care plan section) Progress towards PT goals: Progressing toward goals    Frequency    BID      PT Plan Current plan remains appropriate    Co-evaluation              AM-PAC PT "6 Clicks" Mobility   Outcome Measure  Help needed turning from your back to your side while in a flat bed without using bedrails?: A Little Help needed moving from lying on your back to sitting on the side of a flat bed without  using bedrails?: A Little Help needed moving to and from a bed to a chair (including a wheelchair)?: A Little Help needed standing up from a chair using your arms (e.g., wheelchair or bedside chair)?: A Little Help needed to walk in hospital room?: A Little Help needed climbing 3-5 steps with a railing? : A Little 6 Click Score: 18    End of Session Equipment Utilized During Treatment: Gait belt Activity Tolerance: Patient tolerated treatment well Patient left: in chair;with chair alarm set;with call bell/phone within reach   Pain - Right/Left: Left Pain - part of body: Hip     Time: 8421-0312 PT Time Calculation (min) (ACUTE ONLY): 23 min  Charges:  $Gait Training: 8-22 mins $Therapeutic Exercise: 8-22 mins                     Chesley Noon, PTA 11/21/18, 10:22 AM

## 2018-11-21 NOTE — Plan of Care (Signed)

## 2018-11-21 NOTE — Progress Notes (Signed)
   Subjective: 2 Days Post-Op Procedure(s) (LRB): TOTAL HIP ARTHROPLASTY ANTERIOR APPROACH (Left) Patient reports pain as mild.   Patient is well, and has had no acute complaints or problems Denies any CP, SOB, ABD pain. We will continue therapy today.  Plan is to go Home after hospital stay.  Plan for today.  Objective: Vital signs in last 24 hours: Temp:  [97.8 F (36.6 C)-99.1 F (37.3 C)] 99.1 F (37.3 C) (07/17 2309) Pulse Rate:  [80-98] 98 (07/17 2309) Resp:  [16] 16 (07/17 2309) BP: (108-146)/(75-82) 143/80 (07/17 2309) SpO2:  [95 %-98 %] 95 % (07/17 2309)  Intake/Output from previous day: 07/17 0701 - 07/18 0700 In: 701.4 [P.O.:480; I.V.:221.4] Out: 500 [Urine:500] Intake/Output this shift: No intake/output data recorded.  Recent Labs    11/19/18 1519 11/20/18 0408  HGB 13.3 11.0*   Recent Labs    11/19/18 1519 11/20/18 0408  WBC 12.3* 8.3  RBC 4.71 3.97  HCT 41.3 34.5*  PLT 389 310   Recent Labs    11/19/18 1519 11/20/18 0408  NA  --  137  K  --  3.5  CL  --  106  CO2  --  24  BUN  --  12  CREATININE 0.82 0.78  GLUCOSE  --  110*  CALCIUM  --  8.2*   No results for input(s): LABPT, INR in the last 72 hours.  EXAM General - Patient is Alert, Appropriate and Oriented Extremity - Neurovascular intact Sensation intact distally Intact pulses distally Dorsiflexion/Plantar flexion intact No cellulitis present Compartment soft Dressing - dressing C/D/I and no drainage, prevena intact with out drainage Motor Function - intact, moving foot and toes well on exam.  Ambulated 80 feet with physical therapy  Past Medical History:  Diagnosis Date  . Anxiety   . Arthritis   . Asthma   . Depression   . Frequent headaches   . GERD (gastroesophageal reflux disease)   . Migraine   . Psoriatic arthritis (HCC)     Assessment/Plan:   2 Days Post-Op Procedure(s) (LRB): TOTAL HIP ARTHROPLASTY ANTERIOR APPROACH (Left) Active Problems:   Status post  total hip replacement, left  Estimated body mass index is 32.38 kg/m as calculated from the following:   Height as of this encounter: 5\' 6"  (1.676 m).   Weight as of this encounter: 91 kg. Advance diet Up with therapy  Needs BM Labs stable VSS - Low grade temp. encouraged incentive spirometer.  Pain well controlled CM to assist with discharge today  DVT Prophylaxis - Lovenox, TED hose and SCDs Weight-Bearing as tolerated to left leg   Reche Dixon, PA-C [place] 11/21/2018, 7:11 AM

## 2018-11-21 NOTE — Progress Notes (Signed)
Patient passing gas but no BM, per Dr [name], its ok to d/c patient home without BM.

## 2018-11-22 DIAGNOSIS — M216X9 Other acquired deformities of unspecified foot: Secondary | ICD-10-CM | POA: Diagnosis not present

## 2018-11-22 DIAGNOSIS — J4520 Mild intermittent asthma, uncomplicated: Secondary | ICD-10-CM | POA: Diagnosis not present

## 2018-11-22 DIAGNOSIS — F329 Major depressive disorder, single episode, unspecified: Secondary | ICD-10-CM | POA: Diagnosis not present

## 2018-11-22 DIAGNOSIS — Z7901 Long term (current) use of anticoagulants: Secondary | ICD-10-CM | POA: Diagnosis not present

## 2018-11-22 DIAGNOSIS — F419 Anxiety disorder, unspecified: Secondary | ICD-10-CM | POA: Diagnosis not present

## 2018-11-22 DIAGNOSIS — I10 Essential (primary) hypertension: Secondary | ICD-10-CM | POA: Diagnosis not present

## 2018-11-22 DIAGNOSIS — K219 Gastro-esophageal reflux disease without esophagitis: Secondary | ICD-10-CM | POA: Diagnosis not present

## 2018-11-22 DIAGNOSIS — Z471 Aftercare following joint replacement surgery: Secondary | ICD-10-CM | POA: Diagnosis not present

## 2018-11-22 DIAGNOSIS — G43909 Migraine, unspecified, not intractable, without status migrainosus: Secondary | ICD-10-CM | POA: Diagnosis not present

## 2018-11-22 DIAGNOSIS — L4050 Arthropathic psoriasis, unspecified: Secondary | ICD-10-CM | POA: Diagnosis not present

## 2018-11-22 DIAGNOSIS — Z96642 Presence of left artificial hip joint: Secondary | ICD-10-CM | POA: Diagnosis not present

## 2018-11-22 DIAGNOSIS — G4730 Sleep apnea, unspecified: Secondary | ICD-10-CM | POA: Diagnosis not present

## 2018-11-23 LAB — SURGICAL PATHOLOGY

## 2018-11-26 DIAGNOSIS — F419 Anxiety disorder, unspecified: Secondary | ICD-10-CM | POA: Diagnosis not present

## 2018-11-26 DIAGNOSIS — F329 Major depressive disorder, single episode, unspecified: Secondary | ICD-10-CM | POA: Diagnosis not present

## 2018-11-26 DIAGNOSIS — I10 Essential (primary) hypertension: Secondary | ICD-10-CM | POA: Diagnosis not present

## 2018-11-26 DIAGNOSIS — Z471 Aftercare following joint replacement surgery: Secondary | ICD-10-CM | POA: Diagnosis not present

## 2018-11-26 DIAGNOSIS — G43909 Migraine, unspecified, not intractable, without status migrainosus: Secondary | ICD-10-CM | POA: Diagnosis not present

## 2018-11-26 DIAGNOSIS — J4520 Mild intermittent asthma, uncomplicated: Secondary | ICD-10-CM | POA: Diagnosis not present

## 2018-11-27 DIAGNOSIS — Z471 Aftercare following joint replacement surgery: Secondary | ICD-10-CM | POA: Diagnosis not present

## 2018-11-27 DIAGNOSIS — F329 Major depressive disorder, single episode, unspecified: Secondary | ICD-10-CM | POA: Diagnosis not present

## 2018-11-27 DIAGNOSIS — I10 Essential (primary) hypertension: Secondary | ICD-10-CM | POA: Diagnosis not present

## 2018-11-27 DIAGNOSIS — J4520 Mild intermittent asthma, uncomplicated: Secondary | ICD-10-CM | POA: Diagnosis not present

## 2018-11-27 DIAGNOSIS — G43909 Migraine, unspecified, not intractable, without status migrainosus: Secondary | ICD-10-CM | POA: Diagnosis not present

## 2018-11-27 DIAGNOSIS — F419 Anxiety disorder, unspecified: Secondary | ICD-10-CM | POA: Diagnosis not present

## 2018-11-30 DIAGNOSIS — G43909 Migraine, unspecified, not intractable, without status migrainosus: Secondary | ICD-10-CM | POA: Diagnosis not present

## 2018-11-30 DIAGNOSIS — F329 Major depressive disorder, single episode, unspecified: Secondary | ICD-10-CM | POA: Diagnosis not present

## 2018-11-30 DIAGNOSIS — I10 Essential (primary) hypertension: Secondary | ICD-10-CM | POA: Diagnosis not present

## 2018-11-30 DIAGNOSIS — J4520 Mild intermittent asthma, uncomplicated: Secondary | ICD-10-CM | POA: Diagnosis not present

## 2018-11-30 DIAGNOSIS — F419 Anxiety disorder, unspecified: Secondary | ICD-10-CM | POA: Diagnosis not present

## 2018-11-30 DIAGNOSIS — Z471 Aftercare following joint replacement surgery: Secondary | ICD-10-CM | POA: Diagnosis not present

## 2018-11-30 NOTE — H&P (Signed)
Chief Complaint  Patient presents with  . Hip Pain  bilateral hip pain   History of the Present Illness: Michele Newton is a 63 y.o. female here for left hip replacement.  She has had prolonged nonoperative treatment and comes in with bilateral hip Oster osteoarthritis and prior injection.  Left hip is hurting  She is being admitted for left hip replacement The patient had a right hip injection performed by Dr. Candelaria Stagers in 05/2018 that offered no pain relief.   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   She is usually very active and keeps her grandchildren that are 2, 4, and 86 years of age.   I have reviewed past medical, surgical, social and family history, and allergies as documented in the EMR.  Past Medical History: Past Medical History:  Diagnosis Date  . Acute tension headache  . Anxiety  . Arthritis  . Depression  . Psoriasis  . S/P VP shunt   Past Surgical History: Past Surgical History:  Procedure Laterality Date  . ear tubes  . FUNCTIONAL ENDOSCOPIC SINUS SURGERY  . NEUROENDOSCOPIC PLACEMENT / REPLACEMENT VENTRICULAR CATHETER W/ ATTACHMENT SHUNT / EXTERNAL DRAIN Right 1994  . right ventricular shunt  . TONSILLECTOMY  . TUBAL LIGATION   Past Family History: Family History  Problem Relation Age of Onset  . Coronary Artery Disease (Blocked arteries around heart) Mother  . Arthritis Mother  . Arthritis Father  . Stroke Sister  . Gout Brother   Medications: Current Outpatient Medications Ordered in Epic  Medication Sig Dispense Refill  . ARIPiprazole (ABILIFY) 2 MG tablet Take by mouth Take 1 tablet (2 mg total) by mouth daily.  . fluticasone (FLONASE) 50 mcg/actuation nasal spray by Nasal route.  . hydroCHLOROthiazide (HYDRODIURIL) 12.5 MG tablet Take by mouth Take 1 tablet (12.5 mg total) by mouth daily.  Marland Kitchen HYDROcodone-acetaminophen (NORCO) 5-325 mg tablet May take 1 tablet every 12 hours, if more pain control is needed for 10 days. Twenty  tabs 20 tablet 0  . montelukast (SINGULAIR) 10 mg tablet Take by mouth.  Marland Kitchen PARoxetine (PAXIL) 40 MG tablet Take 1 tablet by mouth once daily  . QUEtiapine (SEROQUEL) 200 MG tablet Take 1 tablet by mouth once daily  . valsartan (DIOVAN) 160 MG tablet Take by mouth once daily   No current Epic-ordered facility-administered medications on file.   Allergies: Allergies  Allergen Reactions  . Amitriptyline Hallucination  hallucinations  . Haloperidol Other (See Comments)  Paralyzed vocal cords  . Hydrocodone-Acetaminophen Itching  . Venlafaxine Other (See Comments)  Amnesia, hospitalized x 2 weeks    Body mass index is 32.28 kg/m.  Review of Systems: A comprehensive 14 point ROS was performed, reviewed, and the pertinent orthopaedic findings are documented in the HPI.  Vitals:  07/10/18 0936  BP: 128/74   General Physical Examination:  General/Constitutional: No apparent distress: well-nourished and well developed. Eyes: Pupils equal, round with synchronous movement. Lungs: Clear to auscultation HEENT: Normal Vascular: No edema, swelling or tenderness, except as noted in detailed exam. Cardiac: Heart rate and rhythm is regular. Integumentary: No impressive skin lesions present, except as noted in detailed exam. Neuro/Psych: Normal mood and affect, oriented to person, place and time.  Musculoskeletal Examination: On exam, the patient has right hip internal rotation to 10 degrees with severe pain and external rotation is to 50 degrees. The left hip has internal rotation to 10 degrees with external rotation to 40 degrees. She is tender to the groin on  the right when lying flat. She has right hip flexion to 110 degrees and this causes severe pain with internal rotation and extreme flexion. The left hip has symmetric motion.   Radiographs: The patient's recent left hip x-rays obtained approximately 4 months ago were reviewed today which also show the right hip on the AP pelvis  view. The right hip has central arthritis with large osteophyte formation. The left hip is worse on x-ray, but she is more symptomatic on the right hip at present.   Assessment: ICD-10-CM  1. Primary osteoarthritis of left hip M16.12  2. Primary localized osteoarthritis of right hip M16.11   Plan: she has chosen to have her left hip surgery first admitted today for left total hip replacement

## 2018-12-03 DIAGNOSIS — J4520 Mild intermittent asthma, uncomplicated: Secondary | ICD-10-CM | POA: Diagnosis not present

## 2018-12-03 DIAGNOSIS — Z471 Aftercare following joint replacement surgery: Secondary | ICD-10-CM | POA: Diagnosis not present

## 2018-12-03 DIAGNOSIS — I10 Essential (primary) hypertension: Secondary | ICD-10-CM | POA: Diagnosis not present

## 2018-12-03 DIAGNOSIS — F329 Major depressive disorder, single episode, unspecified: Secondary | ICD-10-CM | POA: Diagnosis not present

## 2018-12-03 DIAGNOSIS — G43909 Migraine, unspecified, not intractable, without status migrainosus: Secondary | ICD-10-CM | POA: Diagnosis not present

## 2018-12-03 DIAGNOSIS — F419 Anxiety disorder, unspecified: Secondary | ICD-10-CM | POA: Diagnosis not present

## 2018-12-05 ENCOUNTER — Other Ambulatory Visit: Payer: Self-pay | Admitting: Family Medicine

## 2018-12-05 DIAGNOSIS — G4709 Other insomnia: Secondary | ICD-10-CM

## 2018-12-07 DIAGNOSIS — G43909 Migraine, unspecified, not intractable, without status migrainosus: Secondary | ICD-10-CM | POA: Diagnosis not present

## 2018-12-07 DIAGNOSIS — F419 Anxiety disorder, unspecified: Secondary | ICD-10-CM | POA: Diagnosis not present

## 2018-12-07 DIAGNOSIS — I10 Essential (primary) hypertension: Secondary | ICD-10-CM | POA: Diagnosis not present

## 2018-12-07 DIAGNOSIS — F329 Major depressive disorder, single episode, unspecified: Secondary | ICD-10-CM | POA: Diagnosis not present

## 2018-12-07 DIAGNOSIS — Z471 Aftercare following joint replacement surgery: Secondary | ICD-10-CM | POA: Diagnosis not present

## 2018-12-07 DIAGNOSIS — J4520 Mild intermittent asthma, uncomplicated: Secondary | ICD-10-CM | POA: Diagnosis not present

## 2018-12-10 DIAGNOSIS — F329 Major depressive disorder, single episode, unspecified: Secondary | ICD-10-CM | POA: Diagnosis not present

## 2018-12-10 DIAGNOSIS — F419 Anxiety disorder, unspecified: Secondary | ICD-10-CM | POA: Diagnosis not present

## 2018-12-10 DIAGNOSIS — G43909 Migraine, unspecified, not intractable, without status migrainosus: Secondary | ICD-10-CM | POA: Diagnosis not present

## 2018-12-10 DIAGNOSIS — Z471 Aftercare following joint replacement surgery: Secondary | ICD-10-CM | POA: Diagnosis not present

## 2018-12-10 DIAGNOSIS — I10 Essential (primary) hypertension: Secondary | ICD-10-CM | POA: Diagnosis not present

## 2018-12-10 DIAGNOSIS — J4520 Mild intermittent asthma, uncomplicated: Secondary | ICD-10-CM | POA: Diagnosis not present

## 2018-12-14 DIAGNOSIS — G43909 Migraine, unspecified, not intractable, without status migrainosus: Secondary | ICD-10-CM | POA: Diagnosis not present

## 2018-12-14 DIAGNOSIS — Z471 Aftercare following joint replacement surgery: Secondary | ICD-10-CM | POA: Diagnosis not present

## 2018-12-14 DIAGNOSIS — J4520 Mild intermittent asthma, uncomplicated: Secondary | ICD-10-CM | POA: Diagnosis not present

## 2018-12-14 DIAGNOSIS — F329 Major depressive disorder, single episode, unspecified: Secondary | ICD-10-CM | POA: Diagnosis not present

## 2018-12-14 DIAGNOSIS — F419 Anxiety disorder, unspecified: Secondary | ICD-10-CM | POA: Diagnosis not present

## 2018-12-14 DIAGNOSIS — I10 Essential (primary) hypertension: Secondary | ICD-10-CM | POA: Diagnosis not present

## 2018-12-18 DIAGNOSIS — F419 Anxiety disorder, unspecified: Secondary | ICD-10-CM | POA: Diagnosis not present

## 2018-12-18 DIAGNOSIS — F329 Major depressive disorder, single episode, unspecified: Secondary | ICD-10-CM | POA: Diagnosis not present

## 2018-12-18 DIAGNOSIS — I10 Essential (primary) hypertension: Secondary | ICD-10-CM | POA: Diagnosis not present

## 2018-12-18 DIAGNOSIS — Z471 Aftercare following joint replacement surgery: Secondary | ICD-10-CM | POA: Diagnosis not present

## 2018-12-18 DIAGNOSIS — G43909 Migraine, unspecified, not intractable, without status migrainosus: Secondary | ICD-10-CM | POA: Diagnosis not present

## 2018-12-18 DIAGNOSIS — J4520 Mild intermittent asthma, uncomplicated: Secondary | ICD-10-CM | POA: Diagnosis not present

## 2019-01-01 DIAGNOSIS — M1612 Unilateral primary osteoarthritis, left hip: Secondary | ICD-10-CM | POA: Diagnosis not present

## 2019-01-04 ENCOUNTER — Other Ambulatory Visit: Payer: Self-pay | Admitting: Nurse Practitioner

## 2019-01-04 ENCOUNTER — Other Ambulatory Visit: Payer: Self-pay | Admitting: Family Medicine

## 2019-01-04 DIAGNOSIS — G4709 Other insomnia: Secondary | ICD-10-CM

## 2019-01-04 DIAGNOSIS — K219 Gastro-esophageal reflux disease without esophagitis: Secondary | ICD-10-CM

## 2019-01-04 DIAGNOSIS — F329 Major depressive disorder, single episode, unspecified: Secondary | ICD-10-CM

## 2019-01-04 DIAGNOSIS — F419 Anxiety disorder, unspecified: Secondary | ICD-10-CM

## 2019-01-04 DIAGNOSIS — J4520 Mild intermittent asthma, uncomplicated: Secondary | ICD-10-CM

## 2019-01-06 ENCOUNTER — Encounter: Payer: Self-pay | Admitting: Nurse Practitioner

## 2019-01-12 DIAGNOSIS — Z96642 Presence of left artificial hip joint: Secondary | ICD-10-CM | POA: Diagnosis not present

## 2019-01-12 DIAGNOSIS — M25552 Pain in left hip: Secondary | ICD-10-CM | POA: Diagnosis not present

## 2019-01-15 DIAGNOSIS — M25552 Pain in left hip: Secondary | ICD-10-CM | POA: Diagnosis not present

## 2019-01-15 DIAGNOSIS — Z96642 Presence of left artificial hip joint: Secondary | ICD-10-CM | POA: Diagnosis not present

## 2019-01-21 DIAGNOSIS — M25552 Pain in left hip: Secondary | ICD-10-CM | POA: Diagnosis not present

## 2019-01-21 DIAGNOSIS — Z96642 Presence of left artificial hip joint: Secondary | ICD-10-CM | POA: Diagnosis not present

## 2019-01-22 DIAGNOSIS — M25552 Pain in left hip: Secondary | ICD-10-CM | POA: Diagnosis not present

## 2019-01-22 DIAGNOSIS — Z96642 Presence of left artificial hip joint: Secondary | ICD-10-CM | POA: Diagnosis not present

## 2019-01-26 DIAGNOSIS — M25552 Pain in left hip: Secondary | ICD-10-CM | POA: Diagnosis not present

## 2019-01-26 DIAGNOSIS — Z96642 Presence of left artificial hip joint: Secondary | ICD-10-CM | POA: Diagnosis not present

## 2019-02-03 ENCOUNTER — Other Ambulatory Visit: Payer: Self-pay | Admitting: Family Medicine

## 2019-02-03 DIAGNOSIS — G8929 Other chronic pain: Secondary | ICD-10-CM

## 2019-02-09 DIAGNOSIS — J0140 Acute pansinusitis, unspecified: Secondary | ICD-10-CM | POA: Diagnosis not present

## 2019-02-09 DIAGNOSIS — Z03818 Encounter for observation for suspected exposure to other biological agents ruled out: Secondary | ICD-10-CM | POA: Diagnosis not present

## 2019-02-22 ENCOUNTER — Encounter: Payer: Self-pay | Admitting: Nurse Practitioner

## 2019-02-22 ENCOUNTER — Other Ambulatory Visit: Payer: Self-pay

## 2019-02-22 ENCOUNTER — Ambulatory Visit (INDEPENDENT_AMBULATORY_CARE_PROVIDER_SITE_OTHER): Payer: Medicare Other | Admitting: Nurse Practitioner

## 2019-02-22 DIAGNOSIS — J329 Chronic sinusitis, unspecified: Secondary | ICD-10-CM

## 2019-02-22 DIAGNOSIS — H6506 Acute serous otitis media, recurrent, bilateral: Secondary | ICD-10-CM

## 2019-02-22 MED ORDER — NEOMYCIN-POLYMYXIN-HC 1 % OT SOLN: 3.0000 [drp] | Freq: Four times a day (QID) | OTIC | 0 refills | Status: AC

## 2019-02-22 MED ORDER — LEVOFLOXACIN 500 MG PO TABS: 500.0000 mg | ORAL_TABLET | Freq: Every day | ORAL | 0 refills | Status: AC

## 2019-02-22 MED ORDER — MOMETASONE FUROATE 50 MCG/ACT NA SUSP: 2.0000 | Freq: Every day | NASAL | 12 refills | Status: DC

## 2019-02-22 NOTE — Progress Notes (Signed)
Telemedicine Encounter: Disclosed to patient at start of encounter that we will provide appropriate telemedicine services.  Patient consents to be treated via phone prior to discussion. - Patient is at her home and is accessed via telephone. - Services are provided by Cassell Smiles from [facility].  Subjective:    Patient ID: Michele Newton, female    DOB: [birth year], 63 y.o.   MRN: [id number]  Michele Newton is a 63 y.o. female presenting on 02/22/2019 for Sinusitis (pt was seen at [facility]  walk in clinic x 2 weeks ago and was diagnose with Sinus infection and treated with Amoxicilin, Prednisone. Symptoms subsided but returned shortly after finishing abx. She complains of sore throat, bilateral ear pain, sinus headache and nasal drainage  x3 days. Negative COVID 19 test x 2weeks ago)  HPI Sinusitis Patient started having symptoms about 2 weeks ago 10/6 seen at [facility].  Patient had rebound   Patient has taken Augmentin frequently in past.  Patient only took 5 prednsione.  Sore throat mostly from sinus drainage.  Drainage is thick mucous.      Patient is taking OTC tylenol sinus, is not recently taking Flonase.  Patient has no change in hearing, wears hearing aids.     Social History   Tobacco Use  . Smoking status: Never Smoker  . Smokeless tobacco: Never Used  Substance Use Topics  . Alcohol use: No  . Drug use: No    Review of Systems Per HPI unless specifically indicated above     Objective:    There were no vitals taken for this visit.  Wt Readings from Last 3 Encounters:  11/19/18 200 lb 9.9 oz (91 kg)  11/17/18 200 lb (90.7 kg)  11/16/18 199 lb (90.3 kg)    Physical Exam Patient remotely monitored.  Verbal communication appropriate.  Cognition normal.      Assessment & Plan:   Problem List Items Addressed This Visit    None    Visit Diagnoses    Recurrent sinusitis    -  Primary   Relevant Medications   levofloxacin (LEVAQUIN)  500 MG tablet   mometasone (NASONEX) 50 MCG/ACT nasal spray   Recurrent acute serous otitis media of both ears       Relevant Medications   levofloxacin (LEVAQUIN) 500 MG tablet   NEOMYCIN-POLYMYXIN-HYDROCORTISONE (CORTISPORIN) 1 % SOLN OTIC solution    Consistent with URI and secondary sinusitis with symptoms worsening over the past 7 days and initial symptoms of nasal congestion and sinus pressure over 2 weeks ago. Recurrence with only short term improvement after Augmentin Rx from [facility] urgent care 2 weeks ago.  Plan: 1.START taking levofloxacin 500 mg tablets daily for 7 days.  Discussed completing antibiotic. - While on antibiotic, take a probiotic OTC or from food. - Start Atrovent nasal spray decongestant 2 sprays each nostril up to 4 times daily for 5-7 days - Continue anti-histamine loratadine 10mg  daily. - Can use Flonase 2 sprays each nostril daily for up to 4-6 weeks if no epistaxis. - Start Mucinex-DM OTC for  7-10 days prn congestion 2. Supportive care with nasal saline, warm herbal tea with honey, 3. Improve hydration 4. Tylenol / Motrin PRN fevers  5. Return criteria given    Meds ordered this encounter  Medications  . levofloxacin (LEVAQUIN) 500 MG tablet    Sig: Take 1 tablet (500 mg total) by mouth daily for 7 days.    Dispense:  7 tablet  Refill:  0    Order Specific Question:   Supervising Provider    Answer:   Olin Hauser [[id number]]  . mometasone (NASONEX) 50 MCG/ACT nasal spray    Sig: Place 2 sprays into the nose daily.    Dispense:  17 g    Refill:  12    Order Specific Question:   Supervising Provider    Answer:   Olin Hauser [[id number]]  . NEOMYCIN-POLYMYXIN-HYDROCORTISONE (CORTISPORIN) 1 % SOLN OTIC solution    Sig: Place 3 drops into both ears 4 (four) times daily for 5 days.    Dispense:  10 mL    Refill:  0    Order Specific Question:   Supervising Provider    Answer:   Olin Hauser [[id number]]    - Time  spent in direct consultation with patient via telemedicine about above concerns: 7 minutes  Follow up plan: 7-10 days prn no improvement, sooner if worsening  Cassell Smiles, DNP, AGPCNP-BC Adult Gerontology Primary Care Nurse Practitioner [facility] 02/22/2019, 4:17 PM

## 2019-02-24 ENCOUNTER — Encounter: Payer: Self-pay | Admitting: Nurse Practitioner

## 2019-03-01 ENCOUNTER — Other Ambulatory Visit: Payer: Self-pay | Admitting: Family Medicine

## 2019-03-01 DIAGNOSIS — J329 Chronic sinusitis, unspecified: Secondary | ICD-10-CM

## 2019-03-01 MED ORDER — FLUTICASONE PROPIONATE 50 MCG/ACT NA SUSP: 2.0000 | Freq: Every day | NASAL | 3 refills | Status: DC

## 2019-03-25 ENCOUNTER — Ambulatory Visit (INDEPENDENT_AMBULATORY_CARE_PROVIDER_SITE_OTHER): Payer: Medicare Other | Admitting: Physician Assistant

## 2019-03-25 ENCOUNTER — Other Ambulatory Visit: Payer: Self-pay

## 2019-03-25 ENCOUNTER — Encounter: Payer: Self-pay | Admitting: Physician Assistant

## 2019-03-25 DIAGNOSIS — J4530 Mild persistent asthma, uncomplicated: Secondary | ICD-10-CM

## 2019-03-25 MED ORDER — BREO ELLIPTA 200-25 MCG/INH IN AEPB: 1.0000 | INHALATION_SPRAY | Freq: Every day | RESPIRATORY_TRACT | 1 refills | Status: DC

## 2019-03-25 MED ORDER — PREDNISONE 10 MG PO TABS: ORAL_TABLET | ORAL | 0 refills | Status: DC

## 2019-03-25 MED ORDER — ALBUTEROL SULFATE HFA 108 (90 BASE) MCG/ACT IN AERS: 2.0000 | INHALATION_SPRAY | Freq: Four times a day (QID) | RESPIRATORY_TRACT | 1 refills | Status: DC | PRN

## 2019-03-25 NOTE — Progress Notes (Signed)
Subjective:    Patient ID: Michele Newton, female    DOB: 1955/12/05, 63 y.o.   MRN: [id number]  Michele Newton is a 63 y.o. female presenting on 03/25/2019 for Cough (persistent productive cough, chest congestion w/ rattle sound when she breath, runny nose x 6 weeks)  Virtual Visit via Telephone Note  I connected with Michele Newton on 03/25/19 at  2:00 PM EST by telephone and verified that I am speaking with the correct person using two identifiers.   I discussed the limitations, risks, security and privacy concerns of performing an evaluation and management service by telephone and the availability of in person appointments. I also discussed with the patient that there may be a patient responsible charge related to this service. The patient expressed understanding and agreed to proceed.   HPI   Patient with a history of asthma reports cough, SOB. Two negative COVID tests in July and the end of October. She is currently taking singulair and flonase. She reports she is not taking inhaler because of insurance. She was previously taking Symbicort but this has become unaffordable.   She has been seen for similar issues two times in the past month. The first was on 02/09/2019 when she saw [facility] urgent care and received Augmentin 875-125 mg BID x 7 days and prednisone 10 mg daily x 5 days. On 02/22/2019 she was seen by her PCP and prescribed levaquin 500 mg daily x 7 days as well as Nasonex and cortisporin drops for her ears.   She reports these treatments were ineffective.   Social History   Tobacco Use  . Smoking status: Never Smoker  . Smokeless tobacco: Never Used  Substance Use Topics  . Alcohol use: No  . Drug use: No    Review of Systems Per HPI unless specifically indicated above     Objective:    There were no vitals taken for this visit.  Wt Readings from Last 3 Encounters:  11/19/18 200 lb 9.9 oz (91 kg)  11/17/18 200 lb (90.7 kg)  11/16/18 199 lb (90.3 kg)     Physical Exam Results for orders placed or performed during the hospital encounter of 11/19/18  CBC  Result Value Ref Range   WBC 12.3 (H) 4.0 - 10.5 K/uL   RBC 4.71 3.87 - 5.11 MIL/uL   Hemoglobin 13.3 12.0 - 15.0 g/dL   HCT 41.3 36.0 - 46.0 %   MCV 87.7 80.0 - 100.0 fL   MCH 28.2 26.0 - 34.0 pg   MCHC 32.2 30.0 - 36.0 g/dL   RDW 13.1 11.5 - 15.5 %   Platelets 389 150 - 400 K/uL   nRBC 0.0 0.0 - 0.2 %  Creatinine, serum  Result Value Ref Range   Creatinine, Ser 0.82 0.44 - 1.00 mg/dL   GFR calc non Af Amer >60 >60 mL/min   GFR calc Af Amer >60 >60 mL/min  CBC  Result Value Ref Range   WBC 8.3 4.0 - 10.5 K/uL   RBC 3.97 3.87 - 5.11 MIL/uL   Hemoglobin 11.0 (L) 12.0 - 15.0 g/dL   HCT 34.5 (L) 36.0 - 46.0 %   MCV 86.9 80.0 - 100.0 fL   MCH 27.7 26.0 - 34.0 pg   MCHC 31.9 30.0 - 36.0 g/dL   RDW 13.0 11.5 - 15.5 %   Platelets 310 150 - 400 K/uL   nRBC 0.0 0.0 - 0.2 %  Basic metabolic panel  Result Value Ref Range   Sodium  137 135 - 145 mmol/L   Potassium 3.5 3.5 - 5.1 mmol/L   Chloride 106 98 - 111 mmol/L   CO2 24 22 - 32 mmol/L   Glucose, Bld 110 (H) 70 - 99 mg/dL   BUN 12 8 - 23 mg/dL   Creatinine, Ser 0.78 0.44 - 1.00 mg/dL   Calcium 8.2 (L) 8.9 - 10.3 mg/dL   GFR calc non Af Amer >60 >60 mL/min   GFR calc Af Amer >60 >60 mL/min   Anion gap 7 5 - 15  ABO/Rh  Result Value Ref Range   ABO/RH(D)      O POS Performed at [facility], [address], [place], [place] [address]   Surgical pathology  Result Value Ref Range   SURGICAL PATHOLOGY      Surgical Pathology CASE: [id number] PATIENT: [facility] Surgical Pathology Report     SPECIMEN SUBMITTED: A. Femoral head, left  CLINICAL HISTORY: None provided  PRE-OPERATIVE DIAGNOSIS: Primary osteoarthritis of left hip  POST-OPERATIVE DIAGNOSIS: Same as pre op   DIAGNOSIS: A. FEMORAL HEAD, LEFT; ARTHROPLASTY: - DEGENERATIVE JOINT DISEASE (OSTEOARTHROSIS). - NEGATIVE FOR MALIGNANCY.   GROSS DESCRIPTION: A. Labeled: Left femoral head Received: Formalin Size of specimen:      Head - 4.0 x 4.0 x 3.5 cm      Neck - 3.2 x 2.8 x 1.2 cm      Additional tissue: Absent Articular surface: The articular surface is tan-yellow and slightly irregularly-shaped with multifocal areas of granulation. Cut surface: Sectioning displays tan-yellow, grossly unremarkable bone parenchyma. Other findings: The surgical resection margin is tan with a clean cut.  Block summary: 1 - representative sections (following decalcification)     Final Diagnosis performed by  Allena Napoleon, MD.   Electronically signed 11/23/2018 3:45:21PM The electronic signature indicates that the named Attending Pathologist has evaluated the specimen  Technical component performed at [facility], [address] [place], [place] [address] Lab: [phone] Dir: Rush Farmer, MD, MMM  Professional component performed at [facility], [facility], [place], [place], [place] [address] Lab: [phone] Dir: Dellia Nims. Reuel Derby, MD       Assessment & Plan:  1. Mild persistent asthma, unspecified whether complicated  I think her symptoms are likely due to untreated asthma. She was previously on Symbicort but this is not affordable to her any longer. Will treat with prednisone taper and try to send her a maintenance inhaler that her pharmacy prefers.   - fluticasone furoate-vilanterol (BREO ELLIPTA) 200-25 MCG/INH AEPB; Inhale 1 puff into the lungs daily.  Dispense: 60 each; Refill: 1 - predniSONE (DELTASONE) 10 MG tablet; Take 6 pills on day 1, Take 5 pills on day 2, take 4 pills on day 3, and so on until complete.  Dispense: 21 tablet; Refill: 0 - albuterol (VENTOLIN HFA) 108 (90 Base) MCG/ACT inhaler; Inhale 2 puffs into the lungs every 6 (six) hours as needed for wheezing or shortness of breath.  Dispense: 6.7 g; Refill: 1    Follow up plan: No follow-ups on file.  [facility] 03/25/2019, 2:43 PM

## 2019-03-25 NOTE — Patient Instructions (Signed)
Asthma, Adult ° °Asthma is a long-term (chronic) condition in which the airways get tight and narrow. The airways are the breathing passages that lead from the nose and mouth down into the lungs. A person with asthma will have times when symptoms get worse. These are called asthma attacks. They can cause coughing, whistling sounds when you breathe (wheezing), shortness of breath, and chest pain. They can make it hard to breathe. There is no cure for asthma, but medicines and lifestyle changes can help control it. °There are many things that can bring on an asthma attack or make asthma symptoms worse (triggers). Common triggers include: °· Mold. °· Dust. °· Cigarette smoke. °· Cockroaches. °· Things that can cause allergy symptoms (allergens). These include animal skin flakes (dander) and pollen from trees or grass. °· Things that pollute the air. These may include household cleaners, wood smoke, smog, or chemical odors. °· Cold air, weather changes, and wind. °· Crying or laughing hard. °· Stress. °· Certain medicines or drugs. °· Certain foods such as dried fruit, potato chips, and grape juice. °· Infections, such as a cold or the flu. °· Certain medical conditions or diseases. °· Exercise or tiring activities. °Asthma may be treated with medicines and by staying away from the things that cause asthma attacks. Types of medicines may include: °· Controller medicines. These help prevent asthma symptoms. They are usually taken every day. °· Fast-acting reliever or rescue medicines. These quickly relieve asthma symptoms. They are used as needed and provide short-term relief. °· Allergy medicines if your attacks are brought on by allergens. °· Medicines to help control the body's defense (immune) system. °Follow these instructions at home: °Avoiding triggers in your home °· Change your heating and air conditioning filter often. °· Limit your use of fireplaces and wood stoves. °· Get rid of pests (such as roaches and  mice) and their droppings. °· Throw away plants if you see mold on them. °· Clean your floors. Dust regularly. Use cleaning products that do not smell. °· Have someone vacuum when you are not home. Use a vacuum cleaner with a HEPA filter if possible. °· Replace carpet with wood, tile, or vinyl flooring. Carpet can trap animal skin flakes and dust. °· Use allergy-proof pillows, mattress covers, and box spring covers. °· Wash bed sheets and blankets every week in hot water. Dry them in a dryer. °· Keep your bedroom free of any triggers. °· Avoid pets and keep windows closed when things that cause allergy symptoms are in the air. °· Use blankets that are made of polyester or cotton. °· Clean bathrooms and kitchens with bleach. If possible, have someone repaint the walls in these rooms with mold-resistant paint. Keep out of the rooms that are being cleaned and painted. °· Wash your hands often with soap and water. If soap and water are not available, use hand sanitizer. °· Do not allow anyone to smoke in your home. °General instructions °· Take over-the-counter and prescription medicines only as told by your doctor. °? Talk with your doctor if you have questions about how or when to take your medicines. °? Make note if you need to use your medicines more often than usual. °· Do not use any products that contain nicotine or tobacco, such as cigarettes and e-cigarettes. If you need help quitting, ask your doctor. °· Stay away from secondhand smoke. °· Avoid doing things outdoors when allergen counts are high and when air quality is low. °· Wear a ski mask   when doing outdoor activities in the winter. The mask should cover your nose and mouth. Exercise indoors on cold days if you can. °· Warm up before you exercise. Take time to cool down after exercise. °· Use a peak flow meter as told by your doctor. A peak flow meter is a tool that measures how well the lungs are working. °· Keep track of the peak flow meter's readings.  Write them down. °· Follow your asthma action plan. This is a written plan for taking care of your asthma and treating your attacks. °· Make sure you get all the shots (vaccines) that your doctor recommends. Ask your doctor about a flu shot and a pneumonia shot. °· Keep all follow-up visits as told by your doctor. This is important. °Contact a doctor if: °· You have wheezing, shortness of breath, or a cough even while taking medicine to prevent attacks. °· The mucus you cough up (sputum) is thicker than usual. °· The mucus you cough up changes from clear or white to yellow, green, gray, or bloody. °· You have problems from the medicine you are taking, such as: °? A rash. °? Itching. °? Swelling. °? Trouble breathing. °· You need reliever medicines more than 2-3 times a week. °· Your peak flow reading is still at 50-79% of your personal best after following the action plan for 1 hour. °· You have a fever. °Get help right away if: °· You seem to be worse and are not responding to medicine during an asthma attack. °· You are short of breath even at rest. °· You get short of breath when doing very little activity. °· You have trouble eating, drinking, or talking. °· You have chest pain or tightness. °· You have a fast heartbeat. °· Your lips or fingernails start to turn blue. °· You are light-headed or dizzy, or you faint. °· Your peak flow is less than 50% of your personal best. °· You feel too tired to breathe normally. °Summary °· Asthma is a long-term (chronic) condition in which the airways get tight and narrow. An asthma attack can make it hard to breathe. °· Asthma cannot be cured, but medicines and lifestyle changes can help control it. °· Make sure you understand how to avoid triggers and how and when to use your medicines. °This information is not intended to replace advice given to you by your health care provider. Make sure you discuss any questions you have with your health care provider. °Document  Released: 10/09/2007 Document Revised: 06/25/2018 Document Reviewed: 05/27/2016 °Elsevier Patient Education © 2020 [employer] ° °

## 2019-04-04 ENCOUNTER — Other Ambulatory Visit: Payer: Self-pay | Admitting: Nurse Practitioner

## 2019-04-04 DIAGNOSIS — G4709 Other insomnia: Secondary | ICD-10-CM

## 2019-04-04 DIAGNOSIS — F32A Depression, unspecified: Secondary | ICD-10-CM

## 2019-04-04 DIAGNOSIS — F329 Major depressive disorder, single episode, unspecified: Secondary | ICD-10-CM

## 2019-04-04 DIAGNOSIS — K219 Gastro-esophageal reflux disease without esophagitis: Secondary | ICD-10-CM

## 2019-04-04 DIAGNOSIS — J4520 Mild intermittent asthma, uncomplicated: Secondary | ICD-10-CM

## 2019-04-04 DIAGNOSIS — F419 Anxiety disorder, unspecified: Secondary | ICD-10-CM

## 2019-04-22 DIAGNOSIS — H6691 Otitis media, unspecified, right ear: Secondary | ICD-10-CM | POA: Diagnosis not present

## 2019-04-22 DIAGNOSIS — H7201 Central perforation of tympanic membrane, right ear: Secondary | ICD-10-CM | POA: Diagnosis not present

## 2019-04-22 DIAGNOSIS — H9209 Otalgia, unspecified ear: Secondary | ICD-10-CM | POA: Diagnosis not present

## 2019-05-04 ENCOUNTER — Other Ambulatory Visit: Payer: Self-pay | Admitting: Nurse Practitioner

## 2019-05-04 DIAGNOSIS — G8929 Other chronic pain: Secondary | ICD-10-CM

## 2019-06-15 DIAGNOSIS — L820 Inflamed seborrheic keratosis: Secondary | ICD-10-CM | POA: Diagnosis not present

## 2019-06-15 DIAGNOSIS — L821 Other seborrheic keratosis: Secondary | ICD-10-CM | POA: Diagnosis not present

## 2019-07-03 ENCOUNTER — Other Ambulatory Visit: Payer: Self-pay | Admitting: Family Medicine

## 2019-07-03 DIAGNOSIS — G4709 Other insomnia: Secondary | ICD-10-CM

## 2019-07-03 DIAGNOSIS — F419 Anxiety disorder, unspecified: Secondary | ICD-10-CM

## 2019-07-03 DIAGNOSIS — F329 Major depressive disorder, single episode, unspecified: Secondary | ICD-10-CM

## 2019-07-03 DIAGNOSIS — K219 Gastro-esophageal reflux disease without esophagitis: Secondary | ICD-10-CM

## 2019-07-03 DIAGNOSIS — J4520 Mild intermittent asthma, uncomplicated: Secondary | ICD-10-CM

## 2019-07-04 ENCOUNTER — Ambulatory Visit: Payer: Medicare Other | Attending: Internal Medicine

## 2019-07-04 DIAGNOSIS — Z23 Encounter for immunization: Secondary | ICD-10-CM

## 2019-07-04 NOTE — Progress Notes (Signed)
   Covid-19 Vaccination Clinic  Name:  Michele Newton    MRN: [id number] DOB: 1955/07/12  07/04/2019  [name] was observed post Covid-19 immunization for 15 minutes without incidence. She was provided with Vaccine Information Sheet and instruction to access the V-Safe system.   [name] was instructed to call 911 with any severe reactions post vaccine: Marland Kitchen Difficulty breathing  . Swelling of your face and throat  . A fast heartbeat  . A bad rash all over your body  . Dizziness and weakness    Immunizations Administered    Name Date Dose VIS Date Route   Pfizer COVID-19 Vaccine 07/04/2019  8:31 AM 0.3 mL 04/16/2019 Intramuscular   Manufacturer: [place]   Lot: [id number]   [place]: [id number]

## 2019-07-26 ENCOUNTER — Ambulatory Visit: Payer: Medicare Other | Attending: Internal Medicine

## 2019-07-26 DIAGNOSIS — Z23 Encounter for immunization: Secondary | ICD-10-CM

## 2019-07-26 NOTE — Progress Notes (Signed)
   Covid-19 Vaccination Clinic  Name:  Anoosha Boeh    MRN: [id number] DOB: [birth year]  07/26/2019  Ms. Winchell was observed post Covid-19 immunization for 15 minutes without incident. She was provided with Vaccine Information Sheet and instruction to access the V-Safe system.   Ms. Zablocki was instructed to call 911 with any severe reactions post vaccine: Marland Kitchen Difficulty breathing  . Swelling of face and throat  . A fast heartbeat  . A bad rash all over body  . Dizziness and weakness   Immunizations Administered    Name Date Dose VIS Date Route   Pfizer COVID-19 Vaccine 07/26/2019  8:19 AM 0.3 mL 04/16/2019 Intramuscular   Manufacturer: [place]   Lot: [id number]   [place]: [id number]

## 2019-08-09 ENCOUNTER — Encounter: Payer: Self-pay | Admitting: Family Medicine

## 2019-08-09 ENCOUNTER — Other Ambulatory Visit: Payer: Self-pay

## 2019-08-09 ENCOUNTER — Ambulatory Visit (INDEPENDENT_AMBULATORY_CARE_PROVIDER_SITE_OTHER): Payer: Medicare Other | Admitting: Family Medicine

## 2019-08-09 VITALS — BP 131/69 | HR 85 | Temp 98.2°F | Ht 66.0 in | Wt 199.6 lb

## 2019-08-09 DIAGNOSIS — R7309 Other abnormal glucose: Secondary | ICD-10-CM | POA: Diagnosis not present

## 2019-08-09 DIAGNOSIS — M545 Low back pain, unspecified: Secondary | ICD-10-CM

## 2019-08-09 DIAGNOSIS — J0191 Acute recurrent sinusitis, unspecified: Secondary | ICD-10-CM

## 2019-08-09 DIAGNOSIS — G8929 Other chronic pain: Secondary | ICD-10-CM | POA: Diagnosis not present

## 2019-08-09 DIAGNOSIS — J329 Chronic sinusitis, unspecified: Secondary | ICD-10-CM

## 2019-08-09 LAB — POCT GLYCOSYLATED HEMOGLOBIN (HGB A1C): Hemoglobin A1C: 5.5 % (ref 4.0–5.6)

## 2019-08-09 MED ORDER — KETOROLAC TROMETHAMINE 60 MG/2ML IM SOLN
60.0000 mg | Freq: Once | INTRAMUSCULAR | Status: AC
  Administered 2019-08-09: 60 mg via INTRAMUSCULAR

## 2019-08-09 MED ORDER — METHOCARBAMOL 750 MG PO TABS: 750.0000 mg | ORAL_TABLET | Freq: Four times a day (QID) | ORAL | 0 refills | Status: AC

## 2019-08-09 MED ORDER — LEVOFLOXACIN 500 MG PO TABS: 500.0000 mg | ORAL_TABLET | Freq: Every day | ORAL | 0 refills | Status: DC

## 2019-08-09 NOTE — Assessment & Plan Note (Signed)
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  Reports she is due to follow up with orthopedics again in 2 weeks for re-evaluation of her low back pain.  Discussed can use a short course of muscle relaxers and an injection of Toradol today in clinic to help with symptoms.    Plan: 1) Toradol injection given in clinic today.  Avoid all NSAIDs for the next 6 hours and afterwards can take NSAIDs according to packaging directions. 2) Can take Robaxin 750mg  1 tablet 4x a day as needed for muscle spasms.  Advised to not drive or operate heavy machinery while using this medication as it can cause sedation. 3) Follow up with Orthopedics.

## 2019-08-09 NOTE — Progress Notes (Signed)
Subjective:    Patient ID: Michele Newton, female    DOB: March 05, 1956, 64 y.o.   MRN: [id number]  Michele Newton is a 64 y.o. female presenting on 08/09/2019 for Allergies (Pt has chronic sinus issues with headaches and back pain.)   HPI  [name] presents to clinic with concerns of sinus infection and low back pain.  Reports has a history of chronic sinus infections, has followed with ENT in the past and has had reported balloon sinuplasty on her sinuses without relief of her sinus infections.  Reports has had treatment failure in the past with augmentin.  States happens when the weather shifts from winter to spring and summer to fall every year.  Takes Singulair and flonase daily.  Has tried and has been unable to tolerate Claritin, Zyrtec or Allegra, as they have made her feel jittery in the past. Reports sinus tenderness since last week.  Nothing has made her symptoms better.  Reports increased low back pain.  Is chronic, has had a right hip replacement in July 2020 and was hoping for symptom resolution of her back pain, without relief.  Reports is scheduled to see her orthopedic provider in 2 weeks and was hoping to have some muscle relaxers/pain medications to cover her until she sees her orthopedic provider in 2 weeks.  Denies numbness, tingling, weakness, changes in bowel/bladder function, saddle anesthesia.  Depression screen [facility] 2/9 03/25/2019 11/16/2018 05/12/2018  Decreased Interest 0 1 3  Down, Depressed, Hopeless 0 1 3  PHQ - 2 Score 0 2 6  Altered sleeping - 3 3  Tired, decreased energy - 3 3  Change in appetite - 2 0  Feeling bad or failure about yourself  - 0 0  Trouble concentrating - 2 0  Moving slowly or fidgety/restless - 1 0  Suicidal thoughts - 0 0  PHQ-9 Score - 13 12  Difficult doing work/chores - Somewhat difficult Very difficult    Social History   Tobacco Use  . Smoking status: Never Smoker  . Smokeless tobacco: Never Used  Substance Use Topics  . Alcohol use: No    . Drug use: No    Review of Systems  Constitutional: Negative.   HENT: Positive for congestion, ear pain, postnasal drip, rhinorrhea, sinus pressure and sinus pain. Negative for dental problem, drooling, ear discharge, facial swelling, hearing loss, mouth sores, nosebleeds, sneezing, sore throat, tinnitus, trouble swallowing and voice change.   Eyes: Negative.   Respiratory: Negative.   Cardiovascular: Negative.   Gastrointestinal: Negative.   Endocrine: Negative.   Genitourinary: Negative.   Musculoskeletal: Positive for back pain. Negative for arthralgias, gait problem, joint swelling, myalgias, neck pain and neck stiffness.  Skin: Negative.   Allergic/Immunologic: Positive for environmental allergies. Negative for food allergies and immunocompromised state.  Neurological: Negative.   Hematological: Negative.   Psychiatric/Behavioral: Negative.    Per HPI unless specifically indicated above     Objective:    BP 131/69   Pulse 85   Temp 98.2 F (36.8 C) (Temporal)   Ht 5\' 6"  (1.676 m)   Wt 199 lb 9.6 oz (90.5 kg)   SpO2 98%   BMI 32.22 kg/m   Wt Readings from Last 3 Encounters:  08/09/19 199 lb 9.6 oz (90.5 kg)  11/19/18 200 lb 9.9 oz (91 kg)  11/17/18 200 lb (90.7 kg)    Physical Exam Vitals reviewed.  Constitutional:      General: She is not in acute distress.    Appearance:  Normal appearance. She is well-developed, well-groomed and overweight. She is not ill-appearing or toxic-appearing.  HENT:     Head: Normocephalic.     Right Ear: Hearing, ear canal and external ear normal. No decreased hearing noted. No laceration, drainage, swelling or tenderness. No middle ear effusion. There is no impacted cerumen. No foreign body. No mastoid tenderness. No PE tube. Tympanic membrane is injected, perforated and erythematous. Tympanic membrane is not bulging.     Left Ear: Hearing, ear canal and external ear normal. No decreased hearing noted. No laceration, drainage,  swelling or tenderness.  No middle ear effusion. There is no impacted cerumen. No foreign body. No mastoid tenderness. No PE tube. Tympanic membrane is injected, erythematous and bulging. Tympanic membrane is not perforated.     Ears:     Comments: Right TM ruptured at 4 o'clock positioning.    Nose: Congestion present. No nasal deformity, septal deviation, signs of injury, laceration, nasal tenderness, mucosal edema or rhinorrhea.     Right Nostril: No foreign body, epistaxis or occlusion.     Left Nostril: No foreign body, epistaxis or occlusion.     Right Turbinates: Enlarged, swollen and pale.     Left Turbinates: Enlarged, swollen and pale.     Right Sinus: Maxillary sinus tenderness and frontal sinus tenderness present.     Left Sinus: Maxillary sinus tenderness and frontal sinus tenderness present.     Comments: Purulent drainage noted from bilateral nares    Mouth/Throat:     Lips: Pink.     Mouth: Mucous membranes are moist.     Pharynx: Oropharynx is clear. Uvula midline. No oropharyngeal exudate or posterior oropharyngeal erythema.     Tonsils: 1+ on the right. 1+ on the left.  Eyes:     General: Lids are normal. Vision grossly intact. No scleral icterus.       Right eye: No discharge.        Left eye: No discharge.     Extraocular Movements: Extraocular movements intact.     Conjunctiva/sclera: Conjunctivae normal.     Pupils: Pupils are equal, round, and reactive to light.  Neck:     Thyroid: No thyroid mass or thyromegaly.  Cardiovascular:     Rate and Rhythm: Normal rate and regular rhythm.     Pulses: Normal pulses.          Dorsalis pedis pulses are 2+ on the right side and 2+ on the left side.       Posterior tibial pulses are 2+ on the right side and 2+ on the left side.     Heart sounds: Normal heart sounds. No murmur. No friction rub. No gallop.   Pulmonary:     Effort: Pulmonary effort is normal. No respiratory distress.     Breath sounds: Normal breath sounds.   Abdominal:     General: Abdomen is flat. Bowel sounds are normal. There is no distension.     Palpations: Abdomen is soft. There is no mass.     Tenderness: There is no abdominal tenderness. There is no guarding or rebound.  Musculoskeletal:     Cervical back: Normal, normal range of motion and neck supple. No tenderness.     Thoracic back: Normal.     Lumbar back: Spasms and tenderness present. No swelling, edema, deformity, signs of trauma, lacerations or bony tenderness. Decreased range of motion. Positive right straight leg raise test and positive left straight leg raise test. No scoliosis.  Right lower leg: No edema.     Left lower leg: No edema.  Feet:     Right foot:     Skin integrity: Skin integrity normal.     Left foot:     Skin integrity: Skin integrity normal.  Lymphadenopathy:     Cervical: No cervical adenopathy.  Skin:    General: Skin is warm and dry.     Capillary Refill: Capillary refill takes less than 2 seconds.  Neurological:     General: No focal deficit present.     Mental Status: She is alert and oriented to person, place, and time.     Cranial Nerves: No cranial nerve deficit.     Sensory: No sensory deficit.     Motor: No weakness.     Coordination: Coordination normal.     Gait: Gait normal.  Psychiatric:        Attention and Perception: Attention and perception normal.        Mood and Affect: Mood and affect normal.        Speech: Speech normal.        Behavior: Behavior normal. Behavior is cooperative.        Thought Content: Thought content normal.        Cognition and Memory: Cognition and memory normal.        Judgment: Judgment normal.     Results for orders placed or performed during the hospital encounter of 11/19/18  CBC  Result Value Ref Range   WBC 12.3 (H) 4.0 - 10.5 K/uL   RBC 4.71 3.87 - 5.11 MIL/uL   Hemoglobin 13.3 12.0 - 15.0 g/dL   HCT 41.3 36.0 - 46.0 %   MCV 87.7 80.0 - 100.0 fL   MCH 28.2 26.0 - 34.0 pg   MCHC 32.2  30.0 - 36.0 g/dL   RDW 13.1 11.5 - 15.5 %   Platelets 389 150 - 400 K/uL   nRBC 0.0 0.0 - 0.2 %  Creatinine, serum  Result Value Ref Range   Creatinine, Ser 0.82 0.44 - 1.00 mg/dL   GFR calc non Af Amer >60 >60 mL/min   GFR calc Af Amer >60 >60 mL/min  CBC  Result Value Ref Range   WBC 8.3 4.0 - 10.5 K/uL   RBC 3.97 3.87 - 5.11 MIL/uL   Hemoglobin 11.0 (L) 12.0 - 15.0 g/dL   HCT 34.5 (L) 36.0 - 46.0 %   MCV 86.9 80.0 - 100.0 fL   MCH 27.7 26.0 - 34.0 pg   MCHC 31.9 30.0 - 36.0 g/dL   RDW 13.0 11.5 - 15.5 %   Platelets 310 150 - 400 K/uL   nRBC 0.0 0.0 - 0.2 %  Basic metabolic panel  Result Value Ref Range   Sodium 137 135 - 145 mmol/L   Potassium 3.5 3.5 - 5.1 mmol/L   Chloride 106 98 - 111 mmol/L   CO2 24 22 - 32 mmol/L   Glucose, Bld 110 (H) 70 - 99 mg/dL   BUN 12 8 - 23 mg/dL   Creatinine, Ser 0.78 0.44 - 1.00 mg/dL   Calcium 8.2 (L) 8.9 - 10.3 mg/dL   GFR calc non Af Amer >60 >60 mL/min   GFR calc Af Amer >60 >60 mL/min   Anion gap 7 5 - 15  ABO/Rh  Result Value Ref Range   ABO/RH(D)      O POS Performed at [facility], [address], [place], [place] [address]  Surgical pathology  Result Value Ref Range   SURGICAL PATHOLOGY      Surgical Pathology CASE: [id number] PATIENT: [facility] Surgical Pathology Report     SPECIMEN SUBMITTED: A. Femoral head, left  CLINICAL HISTORY: None provided  PRE-OPERATIVE DIAGNOSIS: Primary osteoarthritis of left hip  POST-OPERATIVE DIAGNOSIS: Same as pre op   DIAGNOSIS: A. FEMORAL HEAD, LEFT; ARTHROPLASTY: - DEGENERATIVE JOINT DISEASE (OSTEOARTHROSIS). - NEGATIVE FOR MALIGNANCY.  GROSS DESCRIPTION: A. Labeled: Left femoral head Received: Formalin Size of specimen:      Head - 4.0 x 4.0 x 3.5 cm      Neck - 3.2 x 2.8 x 1.2 cm      Additional tissue: Absent Articular surface: The articular surface is tan-yellow and slightly irregularly-shaped with multifocal areas of granulation. Cut  surface: Sectioning displays tan-yellow, grossly unremarkable bone parenchyma. Other findings: The surgical resection margin is tan with a clean cut.  Block summary: 1 - representative sections (following decalcification)     Final Diagnosis performed by  Allena Napoleon, MD.   Electronically signed 11/23/2018 3:45:21PM The electronic signature indicates that the named Attending Pathologist has evaluated the specimen  Technical component performed at [facility], [address], [place], [place] [address] Lab: [phone] Dir: Rush Farmer, MD, MMM  Professional component performed at [facility], [facility], [place], [place], [place] [address] Lab: [phone] Dir: Dellia Nims. Reuel Derby, MD       Assessment & Plan:   Problem List Items Addressed This Visit      Respiratory   Sinusitis    Reviewed with patient, is sinus infection current in clinic, given reported history of balloon sinuplasty with ENT and continued chronic sinus infections, referral to be placed to Allergist for allergy testing and additional evaluation.  Reports unable to tolerate daily allergy medication in addition to singulair such as claritin, zyrtec or allergra due to feeling jittery.  Plan: 1) Had symptom resolution with Levaquin in October 2020, will repeat Levaquin 500mg , 1x per day x 7 days. 2) Continue singulair and flonase as directed 3) Follow up with Allergist and ENT for chronic sinusitis      Relevant Medications   levofloxacin (LEVAQUIN) 500 MG tablet   Other Relevant Orders   Ambulatory referral to Allergy     Other   Back pain - Primary    Recurrent low back pain, had right hip replaced in July 2020 with hope that her chronic low back pain would resolve, without much change in symptoms.  Reports she is due to follow up with orthopedics again in 2 weeks for re-evaluation of her low back pain.  Discussed can use a short course of muscle relaxers and an injection of Toradol today  in clinic to help with symptoms.    Plan: 1) Toradol injection given in clinic today.  Avoid all NSAIDs for the next 6 hours and afterwards can take NSAIDs according to packaging directions. 2) Can take Robaxin 750mg  1 tablet 4x a day as needed for muscle spasms.  Advised to not drive or operate heavy machinery while using this medication as it can cause sedation. 3) Follow up with Orthopedics.      Relevant Medications   methocarbamol (ROBAXIN-750) 750 MG tablet   HYDROcodone-acetaminophen (NORCO/VICODIN) 5-325 MG tablet   ketorolac (TORADOL) injection 60 mg    Other Visit Diagnoses    Elevated glucose       Relevant Orders   POCT glycosylated hemoglobin (Hb A1C)      Meds ordered  this encounter  Medications  . methocarbamol (ROBAXIN-750) 750 MG tablet    Sig: Take 1 tablet (750 mg total) by mouth 4 (four) times daily for 7 days.    Dispense:  28 tablet    Refill:  0  . levofloxacin (LEVAQUIN) 500 MG tablet    Sig: Take 1 tablet (500 mg total) by mouth daily.    Dispense:  7 tablet    Refill:  0  . ketorolac (TORADOL) injection 60 mg      Follow up plan: Return in about 3 months (around 11/08/2019) for Physical & Labs.   Harlin Rain, [place] [facility] Nurse Practitioner [facility] 08/09/2019, 1:29 PM

## 2019-08-09 NOTE — Assessment & Plan Note (Signed)
Reviewed with patient, is sinus infection current in clinic, given reported history of balloon sinuplasty with ENT and continued chronic sinus infections, referral to be placed to Allergist for allergy testing and additional evaluation.  Reports unable to tolerate daily allergy medication in addition to singulair such as claritin, zyrtec or allergra due to feeling jittery.  Plan: 1) Had symptom resolution with Levaquin in October 2020, will repeat Levaquin 500mg , 1x per day x 7 days. 2) Continue singulair and flonase as directed 3) Follow up with Allergist and ENT for chronic sinusitis

## 2019-08-09 NOTE — Patient Instructions (Signed)
As we discussed, we have given you an injection of Toradol for some pain relief.  Follow up with your orthopedic provider regarding your low back pain and hip pain.  I have put in a referral to Allergy for testing due to your chronic recurrent sinus infections.  If you have not heard back from them in 1 week to contact us and I will follow up with the referral coordinator.  As we discussed, it is important to follow up with Allergy and ENT for recurrent sinus infections, as chronic antibiotic usage can lead to antibiotic resistance and creating resistant microorganisms.  We will plan to see you back in 3 months for your physical  You will receive a survey after today's visit either digitally by e-mail or paper by [place] mail. Your experiences and feedback matter to [place].  Please respond so we know how we are doing as we provide care for you.  Call us with any questions/concerns/needs.  It is my goal to be available to you for your health concerns.  Thanks for choosing me to be a partner in your healthcare needs!  Harlin Rain, FNP-C Family Nurse Practitioner [facility] Phone: [phone]

## 2019-08-09 NOTE — Assessment & Plan Note (Signed)
No recent labs.  Elevated glucose on 11/2018 lab results.   Plan: 1) POCT A1C completed today 2) Schedule physical for next 2-3 months and we will complete labs as well as preventative health screenings

## 2019-08-23 DIAGNOSIS — M47817 Spondylosis without myelopathy or radiculopathy, lumbosacral region: Secondary | ICD-10-CM | POA: Diagnosis not present

## 2019-08-23 DIAGNOSIS — M545 Low back pain: Secondary | ICD-10-CM | POA: Diagnosis not present

## 2019-08-27 ENCOUNTER — Other Ambulatory Visit: Payer: Self-pay | Admitting: Physical Medicine & Rehabilitation

## 2019-08-27 DIAGNOSIS — M5442 Lumbago with sciatica, left side: Secondary | ICD-10-CM | POA: Diagnosis not present

## 2019-08-27 DIAGNOSIS — M5416 Radiculopathy, lumbar region: Secondary | ICD-10-CM

## 2019-08-27 DIAGNOSIS — G8929 Other chronic pain: Secondary | ICD-10-CM | POA: Diagnosis not present

## 2019-09-07 DIAGNOSIS — Z91030 Bee allergy status: Secondary | ICD-10-CM | POA: Diagnosis not present

## 2019-09-07 DIAGNOSIS — K219 Gastro-esophageal reflux disease without esophagitis: Secondary | ICD-10-CM | POA: Diagnosis not present

## 2019-09-07 DIAGNOSIS — J309 Allergic rhinitis, unspecified: Secondary | ICD-10-CM | POA: Diagnosis not present

## 2019-09-07 DIAGNOSIS — J4530 Mild persistent asthma, uncomplicated: Secondary | ICD-10-CM | POA: Diagnosis not present

## 2019-09-08 ENCOUNTER — Other Ambulatory Visit: Payer: Self-pay | Admitting: Physician Assistant

## 2019-09-08 DIAGNOSIS — J4530 Mild persistent asthma, uncomplicated: Secondary | ICD-10-CM

## 2019-09-09 ENCOUNTER — Other Ambulatory Visit: Payer: Self-pay | Admitting: Physician Assistant

## 2019-09-09 ENCOUNTER — Ambulatory Visit
Admission: RE | Admit: 2019-09-09 | Discharge: 2019-09-09 | Disposition: A | Discharge status: Home / Self Care | Payer: Medicare Other | Source: Ambulatory Visit | Attending: Physical Medicine & Rehabilitation | Admitting: Physical Medicine & Rehabilitation

## 2019-09-09 ENCOUNTER — Other Ambulatory Visit: Payer: Self-pay

## 2019-09-09 DIAGNOSIS — J4530 Mild persistent asthma, uncomplicated: Secondary | ICD-10-CM

## 2019-09-09 DIAGNOSIS — M5416 Radiculopathy, lumbar region: Secondary | ICD-10-CM

## 2019-09-09 DIAGNOSIS — M545 Low back pain: Secondary | ICD-10-CM | POA: Diagnosis not present

## 2019-09-14 DIAGNOSIS — M47816 Spondylosis without myelopathy or radiculopathy, lumbar region: Secondary | ICD-10-CM | POA: Insufficient documentation

## 2019-09-14 DIAGNOSIS — M5442 Lumbago with sciatica, left side: Secondary | ICD-10-CM | POA: Diagnosis not present

## 2019-09-14 DIAGNOSIS — G8929 Other chronic pain: Secondary | ICD-10-CM | POA: Diagnosis not present

## 2019-09-15 ENCOUNTER — Encounter: Payer: Self-pay | Admitting: Family Medicine

## 2019-09-15 ENCOUNTER — Ambulatory Visit (INDEPENDENT_AMBULATORY_CARE_PROVIDER_SITE_OTHER): Payer: Medicare Other | Admitting: Family Medicine

## 2019-09-15 ENCOUNTER — Ambulatory Visit
Admission: RE | Admit: 2019-09-15 | Discharge: 2019-09-15 | Disposition: A | Discharge status: Home / Self Care | Payer: Medicare Other | Source: Ambulatory Visit | Attending: Family Medicine | Admitting: Family Medicine

## 2019-09-15 ENCOUNTER — Other Ambulatory Visit: Payer: Self-pay

## 2019-09-15 VITALS — BP 137/74 | HR 98 | Temp 97.3°F | Ht 66.0 in | Wt 198.6 lb

## 2019-09-15 DIAGNOSIS — M25532 Pain in left wrist: Secondary | ICD-10-CM | POA: Diagnosis not present

## 2019-09-15 DIAGNOSIS — Z789 Other specified health status: Secondary | ICD-10-CM | POA: Insufficient documentation

## 2019-09-15 DIAGNOSIS — M19032 Primary osteoarthritis, left wrist: Secondary | ICD-10-CM | POA: Diagnosis not present

## 2019-09-15 DIAGNOSIS — R490 Dysphonia: Secondary | ICD-10-CM | POA: Insufficient documentation

## 2019-09-15 DIAGNOSIS — J4520 Mild intermittent asthma, uncomplicated: Secondary | ICD-10-CM

## 2019-09-15 NOTE — Patient Instructions (Signed)
I have put in a referral for chronic care management for assistance with your [facility] prescription.  You should hear from [facility] (Pharmacist) within the next week or so.  We have taken a left wrist xray in clinic today.  When the report has come back from the radiology over read I will be in touch with you.  I have put in a referral for occupational therapy to help with wrist range of motion/strengthening exercises for your left wrist pain.  We will plan to see you back in  1 months for left wrist follow up  You will receive a survey after today's visit either digitally by e-mail or paper by [place] mail. Your experiences and feedback matter to [place].  Please respond so we know how we are doing as we provide care for you.  Call us with any questions/concerns/needs.  It is my goal to be available to you for your health concerns.  Thanks for choosing me to be a partner in your healthcare needs!  Harlin Rain, FNP-C Family Nurse Practitioner [facility] Phone: [phone]

## 2019-09-15 NOTE — Assessment & Plan Note (Signed)
Reports hoarseness with seasonal allergies.  States had just had met with [facility] and was told that all of her allergy tests were negative, did not have an update on what was causing her hoarseness.  Patient was taking singulair while having allergy testing done.  Advised patient to follow up with provider's office to ensure they were aware she was taking singulair with the allergy testing and their recommendations for her hoarseness.

## 2019-09-15 NOTE — Assessment & Plan Note (Signed)
Referral for CCM services with Pharmacy placed today, 09/15/2019, as patient has concerns for cost of Breo and if she qualifies for any programs that could be of a cost-savings.

## 2019-09-15 NOTE — Assessment & Plan Note (Signed)
Left wrist pain with swelling x 1 week.  Without known injury/fall/trauma.  Will complete x-ray and discuss symptom care as well as beginning with occupational therapy for ROM, strengthening and evaluation.  Can take ibuprofen 600-800 mg every 6-8 hours as needed for pain, as well as rest, ice and elevate the left wrist/hand.  Plan: 1. Left wrist xray completed today 2. Referral for Occupational Therapy placed today 3. Follow up in 4 weeks

## 2019-09-15 NOTE — Progress Notes (Signed)
Subjective:    Patient ID: Michele Newton, female    DOB: 06-13-1955, 64 y.o.   MRN: [id number]  Michele Newton is a 64 y.o. female presenting on 09/15/2019 for Wrist Pain (Dull ache in the left side wrist that radiates down in the fifth digit (pinky finger), pain worsen with movement or when she grabs something with that hand. x 1 weeks ) and Asthma (pt state that the Breo was to expense. She is requesting a generic prescription.)   HPI  [name] presents to clinic to discuss left wrist pain and concerns for cost of Breo.  Reports that she has had left sided wrist pain with swelling and numbness/tingling into the left 5th digit x 1 week.  Denies injury/trauma/fall before swelling and pain started.  Denies previous injury/surgery to this left wrist.  States swelling is a bit better today but finds is having difficulty with ROM and grip strength.  Has concerns for cost of Breo, no samples available in the office.  Discussed could put in referral to Chronic Care Management with our CCM Pharmacist to look into different programs that may be available for her, patient agreeable.   Depression screen [facility] 2/9 09/15/2019 03/25/2019 11/16/2018  Decreased Interest 0 0 1  Down, Depressed, Hopeless 0 0 1  PHQ - 2 Score 0 0 2  Altered sleeping 0 - 3  Tired, decreased energy 0 - 3  Change in appetite 0 - 2  Feeling bad or failure about yourself  0 - 0  Trouble concentrating 0 - 2  Moving slowly or fidgety/restless 0 - 1  Suicidal thoughts 0 - 0  PHQ-9 Score 0 - 13  Difficult doing work/chores - - Somewhat difficult    Social History   Tobacco Use  . Smoking status: Never Smoker  . Smokeless tobacco: Never Used  Substance Use Topics  . Alcohol use: No  . Drug use: No    Review of Systems  Constitutional: Negative.   HENT: Positive for voice change. Negative for congestion, dental problem, drooling, ear discharge, ear pain, facial swelling, hearing loss, mouth sores, nosebleeds, postnasal drip,  rhinorrhea, sinus pressure, sinus pain, sneezing, sore throat, tinnitus and trouble swallowing.   Eyes: Negative.   Respiratory: Negative.   Cardiovascular: Negative.   Gastrointestinal: Negative.   Endocrine: Negative.   Genitourinary: Negative.   Musculoskeletal: Positive for arthralgias and joint swelling. Negative for back pain, gait problem, myalgias, neck pain and neck stiffness.  Skin: Negative.   Allergic/Immunologic: Negative.   Neurological: Negative.   Hematological: Negative.   Psychiatric/Behavioral: Negative.    Per HPI unless specifically indicated above     Objective:    BP 137/74   Pulse 98   Temp (!) 97.3 F (36.3 C) (Temporal)   Ht 5' 6" (1.676 m)   Wt 198 lb 9.6 oz (90.1 kg)   SpO2 (!) 74%   BMI 32.05 kg/m   Wt Readings from Last 3 Encounters:  09/15/19 198 lb 9.6 oz (90.1 kg)  08/09/19 199 lb 9.6 oz (90.5 kg)  11/19/18 200 lb 9.9 oz (91 kg)    Physical Exam Vitals reviewed.  Constitutional:      General: She is not in acute distress.    Appearance: Normal appearance. She is well-developed and well-groomed. She is obese. She is not ill-appearing or toxic-appearing.  HENT:     Head: Normocephalic.  Eyes:     General: Lids are normal. Vision grossly intact.  Right eye: No discharge.        Left eye: No discharge.     Extraocular Movements: Extraocular movements intact.     Conjunctiva/sclera: Conjunctivae normal.     Pupils: Pupils are equal, round, and reactive to light.  Cardiovascular:     Rate and Rhythm: Normal rate and regular rhythm.     Pulses: Normal pulses.     Heart sounds: Normal heart sounds. No murmur. No friction rub. No gallop.   Pulmonary:     Effort: Pulmonary effort is normal. No respiratory distress.     Breath sounds: Normal breath sounds.  Musculoskeletal:        General: Swelling and tenderness present.     Right forearm: Normal.     Left forearm: Normal.     Right wrist: Normal.     Left wrist: Swelling and  tenderness present. No deformity, effusion, lacerations, snuff box tenderness or crepitus. Decreased range of motion. Normal pulse.     Right hand: Normal.     Left hand: Normal.     Right lower leg: No edema.     Left lower leg: No edema.  Skin:    General: Skin is warm and dry.     Capillary Refill: Capillary refill takes less than 2 seconds.  Neurological:     General: No focal deficit present.     Mental Status: She is alert and oriented to person, place, and time.     Cranial Nerves: No cranial nerve deficit.     Sensory: No sensory deficit.     Motor: No weakness.     Coordination: Coordination normal.     Gait: Gait normal.  Psychiatric:        Attention and Perception: Attention and perception normal.        Mood and Affect: Mood and affect normal.        Speech: Speech normal.        Behavior: Behavior normal. Behavior is cooperative.        Thought Content: Thought content normal.        Cognition and Memory: Cognition and memory normal.        Judgment: Judgment normal.     Results for orders placed or performed in visit on 08/09/19  POCT glycosylated hemoglobin (Hb A1C)  Result Value Ref Range   Hemoglobin A1C 5.5 4.0 - 5.6 %      Assessment & Plan:   Problem List Items Addressed This Visit      Respiratory   Mild intermittent asthma without complication   Relevant Orders   Ambulatory referral to Chronic Care Management Services     Other   Left wrist pain - Primary    Left wrist pain with swelling x 1 week.  Without known injury/fall/trauma.  Will complete x-ray and discuss symptom care as well as beginning with occupational therapy for ROM, strengthening and evaluation.  Can take ibuprofen 600-800 mg every 6-8 hours as needed for pain, as well as rest, ice and elevate the left wrist/hand.  Plan: 1. Left wrist xray completed today 2. Referral for Occupational Therapy placed today 3. Follow up in 4 weeks      Relevant Orders   DG Wrist Complete Left    Ambulatory referral to Occupational Therapy   Enrolled in chronic care management    Referral for CCM services with Pharmacy placed today, 09/15/2019, as patient has concerns for cost of Breo and if she qualifies for any programs that  could be of a cost-savings.      Hoarseness of voice    Reports hoarseness with seasonal allergies.  States had just had met with [facility] and was told that all of her allergy tests were negative, did not have an update on what was causing her hoarseness.  Patient was taking singulair while having allergy testing done.  Advised patient to follow up with provider's office to ensure they were aware she was taking singulair with the allergy testing and their recommendations for her hoarseness.         No orders of the defined types were placed in this encounter.     Follow up plan: Return in about 4 weeks (around 10/13/2019) for Left wrist follow up.   Harlin Rain, [place] Family Nurse Practitioner [facility] 09/15/2019, 1:29 PM

## 2019-09-17 ENCOUNTER — Telehealth: Payer: Self-pay | Admitting: Family Medicine

## 2019-09-17 DIAGNOSIS — M47816 Spondylosis without myelopathy or radiculopathy, lumbar region: Secondary | ICD-10-CM | POA: Diagnosis not present

## 2019-09-17 NOTE — Chronic Care Management (AMB) (Signed)
  Chronic Care Management   Note  09/17/2019 Name: [name] MRN: [id number] DOB: 1955-08-05  Michele Newton is a 64 y.o. year old female who is a primary care patient of Lorine Bears, Lupita Raider, FNP. I reached out to [name] by phone today in response to a referral sent by Ms. Desmond Lope PCP, Cyndia Skeeters FNP     Ms. Nuss was given information about Chronic Care Management services today including:  1. CCM service includes personalized support from designated clinical staff supervised by her physician, including individualized plan of care and coordination with other care providers 2. 24/7 contact phone numbers for assistance for urgent and routine care needs. 3. Service will only be billed when office clinical staff spend 20 minutes or more in a month to coordinate care. 4. Only one practitioner may furnish and bill the service in a calendar month. 5. The patient may stop CCM services at any time (effective at the end of the month) by phone call to the office staff. 6. The patient will be responsible for cost sharing (co-pay) of up to 20% of the service fee (after annual deductible is met).  Patient agreed to services and verbal consent obtained.   Follow up plan: Telephone appointment with care management team member scheduled for:10/06/2019  Glenna Durand, [place] [facility] ??[email or web address] ??[phone]

## 2019-10-01 ENCOUNTER — Other Ambulatory Visit: Payer: Self-pay | Admitting: Family Medicine

## 2019-10-01 DIAGNOSIS — J4520 Mild intermittent asthma, uncomplicated: Secondary | ICD-10-CM

## 2019-10-01 DIAGNOSIS — G4709 Other insomnia: Secondary | ICD-10-CM

## 2019-10-01 DIAGNOSIS — F419 Anxiety disorder, unspecified: Secondary | ICD-10-CM

## 2019-10-01 NOTE — Telephone Encounter (Signed)
Requested medication (s) are due for refill today: yes  Requested medication (s) are on the active medication list: yes  Last refill:  07/08/19  Future visit scheduled: no  Notes to clinic:  not delegated    Requested Prescriptions  Pending Prescriptions Disp Refills   QUEtiapine (SEROQUEL) 200 MG tablet [Pharmacy Med Name: QUETIAPINE 200MG  TABLETS] 90 tablet 0    Sig: TAKE 1 TABLET BY MOUTH AT BEDTIME      Not Delegated - Psychiatry:  Antipsychotics - Second Generation (Atypical) - quetiapine Failed - 10/01/2019  7:09 AM      Failed - This refill cannot be delegated      Failed - ALT in normal range and within 180 days    ALT  Date Value Ref Range Status  10/09/2017 26 6 - 29 U/L Final          Failed - AST in normal range and within 180 days    AST  Date Value Ref Range Status  10/09/2017 19 10 - 35 U/L Final          Passed - Completed PHQ-2 or PHQ-9 in the last 360 days.      Passed - Last BP in normal range    BP Readings from Last 1 Encounters:  09/15/19 137/74          Passed - Valid encounter within last 6 months    Recent Outpatient Visits           2 weeks ago Left wrist pain   [facility] [place], Lupita Raider, FNP   1 month ago Chronic bilateral low back pain without sciatica   [facility], Lupita Raider, FNP   6 months ago Mild persistent asthma, unspecified whether complicated   [place], [place]   7 months ago Recurrent sinusitis   [facility] Merrilyn Puma, Jerrel Ivory, NP   10 months ago Primary osteoarthritis of left hip   [place], DO               Signed Prescriptions Disp Refills   montelukast (SINGULAIR) 10 MG tablet 90 tablet 0    Sig: TAKE 1 TABLET BY MOUTH AT BEDTIME      Pulmonology:  Leukotriene Inhibitors Passed - 10/01/2019  7:09 AM      Passed - Valid encounter within last 12 months    Recent Outpatient  Visits           2 weeks ago Left wrist pain   [facility], Lupita Raider, FNP   1 month ago Chronic bilateral low back pain without sciatica   [facility], Lupita Raider, FNP   6 months ago Mild persistent asthma, unspecified whether complicated   [facility] Carles Collet [place], [place]   7 months ago Recurrent sinusitis   [facility] Mikey College, NP   10 months ago Primary osteoarthritis of left hip   [facility], Devonne Doughty, DO                PARoxetine (PAXIL) 40 MG tablet 90 tablet 0    Sig: TAKE 1 TABLET BY MOUTH AT BEDTIME      Psychiatry:  Antidepressants - SSRI Passed - 10/01/2019  7:09 AM      Passed - Completed PHQ-2 or PHQ-9 in the last 360 days.  Passed - Valid encounter within last 6 months    Recent Outpatient Visits           2 weeks ago Left wrist pain   [facility] [place], Lupita Raider, FNP   1 month ago Chronic bilateral low back pain without sciatica   [facility], Lupita Raider, FNP   6 months ago Mild persistent asthma, unspecified whether complicated   [place], [place]   7 months ago Recurrent sinusitis   [facility] Mikey College, NP   10 months ago Primary osteoarthritis of left hip   [place], Devonne Doughty, [place]

## 2019-10-01 NOTE — Telephone Encounter (Signed)
Requested Prescriptions  Pending Prescriptions Disp Refills  . montelukast (SINGULAIR) 10 MG tablet [Pharmacy Med Name: MONTELUKAST 10MG  TABLETS] 90 tablet 0    Sig: TAKE 1 TABLET BY MOUTH AT BEDTIME     Pulmonology:  Leukotriene Inhibitors Passed - 10/01/2019  7:09 AM      Passed - Valid encounter within last 12 months    Recent Outpatient Visits          2 weeks ago Left wrist pain   [facility], Lupita Raider, FNP   1 month ago Chronic bilateral low back pain without sciatica   [facility], Lupita Raider, FNP   6 months ago Mild persistent asthma, unspecified whether complicated   [place], [place]   7 months ago Recurrent sinusitis   [facility] Merrilyn Puma, Jerrel Ivory, NP   10 months ago Primary osteoarthritis of left hip   [place], DO             . QUEtiapine (SEROQUEL) 200 MG tablet [Pharmacy Med Name: QUETIAPINE 200MG  TABLETS] 90 tablet 0    Sig: TAKE 1 TABLET BY MOUTH AT BEDTIME     Not Delegated - Psychiatry:  Antipsychotics - Second Generation (Atypical) - quetiapine Failed - 10/01/2019  7:09 AM      Failed - This refill cannot be delegated      Failed - ALT in normal range and within 180 days    ALT  Date Value Ref Range Status  10/09/2017 26 6 - 29 U/L Final         Failed - AST in normal range and within 180 days    AST  Date Value Ref Range Status  10/09/2017 19 10 - 35 U/L Final         Passed - Completed PHQ-2 or PHQ-9 in the last 360 days.      Passed - Last BP in normal range    BP Readings from Last 1 Encounters:  09/15/19 137/74         Passed - Valid encounter within last 6 months    Recent Outpatient Visits          2 weeks ago Left wrist pain   [facility] [place], Lupita Raider, FNP   1 month ago Chronic bilateral low back pain without sciatica   [facility], Lupita Raider, FNP   6  months ago Mild persistent asthma, unspecified whether complicated   [place], [place]   7 months ago Recurrent sinusitis   [facility] Mikey College, NP   10 months ago Primary osteoarthritis of left hip   [place], [place]             . PARoxetine (PAXIL) 40 MG tablet [Pharmacy Med Name: PAROXETINE 40MG  TABLETS] 90 tablet 0    Sig: TAKE 1 TABLET BY MOUTH AT BEDTIME     Psychiatry:  Antidepressants - SSRI Passed - 10/01/2019  7:09 AM      Passed - Completed PHQ-2 or PHQ-9 in the last 360 days.      Passed - Valid encounter within last 6 months    Recent Outpatient Visits          2 weeks ago Left wrist pain   [place], Lupita Raider, [place]   1  month ago Chronic bilateral low back pain without sciatica   [place], FNP   6 months ago Mild persistent asthma, unspecified whether complicated   [place], [place]   7 months ago Recurrent sinusitis   [facility] Merrilyn Puma, Jerrel Ivory, NP   10 months ago Primary osteoarthritis of left hip   [place], Devonne Doughty, [place]

## 2019-10-06 ENCOUNTER — Telehealth: Payer: Medicare Other

## 2019-10-06 ENCOUNTER — Ambulatory Visit: Payer: Self-pay | Admitting: Pharmacist

## 2019-10-06 DIAGNOSIS — M48061 Spinal stenosis, lumbar region without neurogenic claudication: Secondary | ICD-10-CM | POA: Diagnosis not present

## 2019-10-06 DIAGNOSIS — M47816 Spondylosis without myelopathy or radiculopathy, lumbar region: Secondary | ICD-10-CM | POA: Diagnosis not present

## 2019-10-06 DIAGNOSIS — M5442 Lumbago with sciatica, left side: Secondary | ICD-10-CM | POA: Diagnosis not present

## 2019-10-06 DIAGNOSIS — G8929 Other chronic pain: Secondary | ICD-10-CM | POA: Diagnosis not present

## 2019-10-06 NOTE — Chronic Care Management (AMB) (Signed)
  Chronic Care Management   Follow Up Note   10/06/2019 Name: Michele Newton MRN: [id number] DOB: May 08, 1955  Referred by: Verl Bangs, FNP Reason for referral : Chronic Care Management (Initial Patient Outreach)   Michele Newton is a 64 y.o. year old female who is a primary care patient of Lorine Bears, Lupita Raider, FNP. The CCM team was consulted for assistance with chronic disease management and care coordination needs.    Was unable to reach patient via telephone today and have left HIPAA compliant voicemail asking patient to return my call.   Plan  The care management team will reach out to the patient again over the next 30 days.   Harlow Asa, PharmD, [place] [employer] [phone]

## 2019-10-10 ENCOUNTER — Other Ambulatory Visit: Payer: Self-pay | Admitting: Family Medicine

## 2019-10-10 ENCOUNTER — Other Ambulatory Visit: Payer: Self-pay | Admitting: Physician Assistant

## 2019-10-10 DIAGNOSIS — J4530 Mild persistent asthma, uncomplicated: Secondary | ICD-10-CM

## 2019-10-10 NOTE — Telephone Encounter (Signed)
Requested Prescriptions  Pending Prescriptions Disp Refills  . VENTOLIN HFA 108 (90 Base) MCG/ACT inhaler [Pharmacy Med Name: VENTOLIN HFA INH W/DOS CTR 200PUFFS] 18 g 0    Sig: INHALE 2 PUFFS INTO THE LUNGS EVERY 6 HOURS AS NEEDED FOR WHEEZING OR SHORTNESS OF BREATH     Pulmonology:  Beta Agonists Failed - 10/10/2019 11:42 AM      Failed - One inhaler should last at least one month. If the patient is requesting refills earlier, contact the patient to check for uncontrolled symptoms.      Passed - Valid encounter within last 12 months    Recent Outpatient Visits          3 weeks ago Left wrist pain   [facility] [place], Lupita Raider, FNP   2 months ago Chronic bilateral low back pain without sciatica   [facility], Lupita Raider, FNP   6 months ago Mild persistent asthma, unspecified whether complicated   [place], [place]   7 months ago Recurrent sinusitis   [facility] Mikey College, NP   10 months ago Primary osteoarthritis of left hip   [place], Devonne Doughty, [place]

## 2019-10-26 ENCOUNTER — Encounter: Payer: Self-pay | Admitting: Family Medicine

## 2019-10-26 ENCOUNTER — Other Ambulatory Visit: Payer: Self-pay

## 2019-10-26 ENCOUNTER — Ambulatory Visit (INDEPENDENT_AMBULATORY_CARE_PROVIDER_SITE_OTHER): Payer: Medicare Other | Admitting: Family Medicine

## 2019-10-26 VITALS — BP 151/97 | HR 87 | Temp 97.5°F | Ht 66.0 in | Wt 200.6 lb

## 2019-10-26 DIAGNOSIS — B029 Zoster without complications: Secondary | ICD-10-CM | POA: Insufficient documentation

## 2019-10-26 MED ORDER — VALACYCLOVIR HCL 1 G PO TABS: 1000.0000 mg | ORAL_TABLET | Freq: Three times a day (TID) | ORAL | 0 refills | Status: AC

## 2019-10-26 MED ORDER — GABAPENTIN 100 MG PO CAPS: 100.0000 mg | ORAL_CAPSULE | Freq: Three times a day (TID) | ORAL | 1 refills | Status: DC

## 2019-10-26 NOTE — Progress Notes (Signed)
Subjective:    Patient ID: Michele Newton, female    DOB: 1955/10/15, 64 y.o.   MRN: [id number]  Michele Newton is a 64 y.o. female presenting on 10/26/2019 for Herpes Zoster (rash on the left breast and under the armpit. The pt state the breast and the armpit is so sensitive that it hurts when anything touches it. )   HPI  Michele Newton presents to clinic with concerns of herpes zoster x 2 days to left axilla and left breast.  Reports increase in pain when anything touches her skin and has been having difficulty getting comfortable for sleep.  Reports had shingles when she was in her early 20's as well.   Depression screen [facility] 2/9 09/15/2019 03/25/2019 11/16/2018  Decreased Interest 0 0 1  Down, Depressed, Hopeless 0 0 1  PHQ - 2 Score 0 0 2  Altered sleeping 0 - 3  Tired, decreased energy 0 - 3  Change in appetite 0 - 2  Feeling bad or failure about yourself  0 - 0  Trouble concentrating 0 - 2  Moving slowly or fidgety/restless 0 - 1  Suicidal thoughts 0 - 0  PHQ-9 Score 0 - 13  Difficult doing work/chores - - Somewhat difficult    Social History   Tobacco Use  . Smoking status: Never Smoker  . Smokeless tobacco: Never Used  Vaping Use  . Vaping Use: Never used  Substance Use Topics  . Alcohol use: No  . Drug use: No    Review of Systems  Constitutional: Negative.   HENT: Negative.   Eyes: Negative.   Respiratory: Negative.   Cardiovascular: Negative.   Gastrointestinal: Negative.   Endocrine: Negative.   Genitourinary: Negative.   Musculoskeletal: Negative.   Skin: Positive for rash. Negative for pallor and wound.  Allergic/Immunologic: Negative.   Neurological: Negative.   Hematological: Negative.   Psychiatric/Behavioral: Negative.    Per HPI unless specifically indicated above     Objective:    BP (!) 151/97 (BP Location: Right Arm, Patient Position: Sitting, Cuff Size: Normal)   Pulse 87   Temp (!) 97.5 F (36.4 C) (Temporal)   Ht 5\' 6"  (1.676 m)   Wt 200  lb 9.6 oz (91 kg)   BMI 32.38 kg/m   Wt Readings from Last 3 Encounters:  10/26/19 200 lb 9.6 oz (91 kg)  09/15/19 198 lb 9.6 oz (90.1 kg)  08/09/19 199 lb 9.6 oz (90.5 kg)    Physical Exam Vitals reviewed.  Constitutional:      General: She is not in acute distress.    Appearance: Normal appearance. She is well-developed and well-groomed. She is obese. She is not ill-appearing or toxic-appearing.  HENT:     Head: Normocephalic.     Nose:     Comments: Michele Newton is in place, covering mouth and nose  Eyes:     General: Lids are normal. Vision grossly intact.        Right eye: No discharge.        Left eye: No discharge.     Extraocular Movements: Extraocular movements intact.     Conjunctiva/sclera: Conjunctivae normal.     Pupils: Pupils are equal, round, and reactive to light.  Cardiovascular:     Pulses: Normal pulses.  Pulmonary:     Effort: Pulmonary effort is normal. No respiratory distress.  Musculoskeletal:     Right lower leg: No edema.     Left lower leg: No edema.  Skin:  General: Skin is warm and dry.     Capillary Refill: Capillary refill takes less than 2 seconds.     Findings: Lesion and rash present.     Comments: Vesicles in various stages of healing along dermatone extending through left axilla through left breast, not crossing midline.    Neurological:     General: No focal deficit present.     Mental Status: She is alert and oriented to person, place, and time.     Cranial Nerves: No cranial nerve deficit.     Sensory: No sensory deficit.     Motor: No weakness.     Coordination: Coordination normal.     Gait: Gait normal.  Psychiatric:        Attention and Perception: Attention and perception normal.        Mood and Affect: Mood and affect normal.        Speech: Speech normal.        Behavior: Behavior normal. Behavior is cooperative.        Thought Content: Thought content normal.        Cognition and Memory: Cognition and memory normal.         Judgment: Judgment normal.    Results for orders placed or performed in visit on 08/09/19  POCT glycosylated hemoglobin (Hb A1C)  Result Value Ref Range   Hemoglobin A1C 5.5 4.0 - 5.6 %      Assessment & Plan:   Problem List Items Addressed This Visit      Other   Herpes zoster - Primary    Herpes zoster x 2 days with lesions in left axilla through top of left breast.  Lesions with crusting and some vesicles present.  Increase in pain/tenderness in left axilla where less lesions are present.  Plan: 1. Begin valacyclovir 1g TID x 7 days 2. Can take gabapentin 100mg  TID x 7-10 days for herpes zoster related pain/discomfort 3. Be sure to dry out the lesions, do not apply any lotions or creams to the area, as drying out of the lesions will help with symptom resolution 4. Follow up with any worsening of symptoms or if symptoms do not improve with current treatment.      Relevant Medications   valACYclovir (VALTREX) 1000 MG tablet   gabapentin (NEURONTIN) 100 MG capsule      Meds ordered this encounter  Medications  . valACYclovir (VALTREX) 1000 MG tablet    Sig: Take 1 tablet (1,000 mg total) by mouth 3 (three) times daily for 7 days.    Dispense:  21 tablet    Refill:  0  . gabapentin (NEURONTIN) 100 MG capsule    Sig: Take 1 capsule (100 mg total) by mouth 3 (three) times daily.    Dispense:  30 capsule    Refill:  1      Follow up plan: Return if symptoms worsen or fail to improve.   Michele Newton, Michele Newton [facility] 10/26/2019, 4:23 PM

## 2019-10-26 NOTE — Assessment & Plan Note (Signed)
Herpes zoster x 2 days with lesions in left axilla through top of left breast.  Lesions with crusting and some vesicles present.  Increase in pain/tenderness in left axilla where less lesions are present.  Plan: 1. Begin valacyclovir 1g TID x 7 days 2. Can take gabapentin 100mg  TID x 7-10 days for herpes zoster related pain/discomfort 3. Be sure to dry out the lesions, do not apply any lotions or creams to the area, as drying out of the lesions will help with symptom resolution 4. Follow up with any worsening of symptoms or if symptoms do not improve with current treatment.

## 2019-10-26 NOTE — Patient Instructions (Signed)
I have sent in two prescriptions.  1. Valacyclovir - to take 1 tablet 3x per day for the next 7 days to help resolve the shingles  2. Gabapentin - to take 1 tablet up to 3x per day for the next 7-10 days to help with the shingles pain.  Be sure to take your first dose of gabapentin when you are at home, this medication can be sedating.  Be sure not to drive or operate heavy machinery while taking this medication.  We will plan to see you back if symptoms worsen or fail to improve  You will receive a survey after today's visit either digitally by e-mail or paper by USPS mail. Your experiences and feedback matter to [place].  Please respond so we know how we are doing as we provide care for you.  Call us with any questions/concerns/needs.  It is my goal to be available to you for your health concerns.  Thanks for choosing me to be a partner in your healthcare needs!  Harlin Rain, FNP-C Family Nurse Practitioner [facility] Phone: [phone]

## 2019-10-27 ENCOUNTER — Ambulatory Visit: Payer: Self-pay | Admitting: Pharmacist

## 2019-10-27 ENCOUNTER — Telehealth: Payer: Medicare Other

## 2019-10-27 NOTE — Chronic Care Management (AMB) (Signed)
  Chronic Care Management   Follow Up Note   10/27/2019 Name: Michele Newton MRN: [id number] DOB: 12-08-1955  Referred by: Verl Bangs, FNP Reason for referral : Chronic Care Management (Initial Patient Outreach Call)   Michele Newton is a 64 y.o. year old female who is a primary care patient of Lorine Bears, Lupita Raider, FNP. The CCM team was consulted for assistance with chronic disease management and care coordination needs.    Was unable to reach patient via telephone today and have left HIPAA compliant voicemail asking patient to return my call. Outreach attempt #2.  Plan  The care management team will reach out to the patient again over the next 30 days.   Harlow Asa, PharmD, [place] [employer] [phone]

## 2019-10-28 ENCOUNTER — Other Ambulatory Visit: Payer: Self-pay | Admitting: Family Medicine

## 2019-10-28 ENCOUNTER — Ambulatory Visit: Payer: Self-pay | Admitting: *Deleted

## 2019-10-28 DIAGNOSIS — B029 Zoster without complications: Secondary | ICD-10-CM

## 2019-10-28 MED ORDER — OXYCODONE-ACETAMINOPHEN 5-325 MG PO TABS: 1.0000 | ORAL_TABLET | Freq: Four times a day (QID) | ORAL | 0 refills | Status: AC | PRN

## 2019-10-28 NOTE — Telephone Encounter (Signed)
Called to report increase pain with new dx of shingles. Office visit on Monday and shingles has now spread from between breast to left axillary. Pain level severe and patient unable to manage pain. Report that tylenol will not work for pain. Denies fever. Care advise given. Requesting" some type of ointment and pain medication". Patient verbalized understanding of care advise.   Reason for Disposition . [1] Shingles rash AND [2] spots start appearing other places on body  Answer Assessment - Initial Assessment Questions 1. APPEARANCE of RASH: "Describe the rash."      Red raised listers  2. LOCATION: "Where is the rash located?"      Left side of back left arm pit and between breast 3. ONSET: "When did the rash start?"      Start between breast on left side 4. ITCHING: "Does the rash itch?" If Yes, ask: "How bad is the itch?"  (Scale 1-10; or mild, moderate, severe)     No itch  5. PAIN: "Does the rash hurt?" If Yes, ask: "How bad is the pain?"  (Scale 1-10; or mild, moderate, severe)     Very painful burns, moderate- severe sharp stabbing pains 6. OTHER SYMPTOMS: "Do you have any other symptoms?" (e.g., fever)     no 7. PREGNANCY: "Is there any chance you are pregnant?" "When was your last menstrual period?"     n/a  Protocols used: [facility]

## 2019-10-28 NOTE — Telephone Encounter (Signed)
Patient spoke with CMA, requesting pain medication for her herpes zoster, is not achieving relief with gabapentin and acetaminophen.  CMA let patient know that FNP would send in Tramadol for her acute pain.  Patient stated "Tramadol aint worth a shit", that she would just go to the ER for medication and that she was requesting hydrocodone for her herpes zoster.  I let CMA know that I would send in a ONE TIME 3 days worth of pain medication and would not refill this.

## 2019-11-02 ENCOUNTER — Encounter: Payer: Self-pay | Admitting: Family Medicine

## 2019-11-15 ENCOUNTER — Other Ambulatory Visit: Payer: Self-pay | Admitting: Family Medicine

## 2019-11-15 ENCOUNTER — Telehealth: Payer: Self-pay

## 2019-11-15 DIAGNOSIS — B029 Zoster without complications: Secondary | ICD-10-CM

## 2019-11-15 MED ORDER — VALACYCLOVIR HCL 1 G PO TABS: 1000.0000 mg | ORAL_TABLET | Freq: Three times a day (TID) | ORAL | 0 refills | Status: AC

## 2019-11-15 NOTE — Telephone Encounter (Signed)
Requested medication (s) are due for refill today: yes  Requested medication (s) are on the active medication list: no  Last refill:  10/26/19  Future visit scheduled: no  Notes to clinic:  prescription expired 11/02/19   Requested Prescriptions  Pending Prescriptions Disp Refills   valACYclovir (VALTREX) 1000 MG tablet [Pharmacy Med Name: VALACYCLOVIR 1GM TABLETS] 21 tablet 0    Sig: TAKE 1 TABLET(1000 MG) BY MOUTH THREE TIMES DAILY FOR 7 DAYS      Antimicrobials:  Antiviral Agents - Anti-Herpetic Passed - 11/15/2019  9:28 AM      Passed - Valid encounter within last 12 months    Recent Outpatient Visits           2 weeks ago Herpes zoster without complication   [place], FNP   2 months ago Left wrist pain   [place], FNP   3 months ago Chronic bilateral low back pain without sciatica   [facility], Lupita Raider, FNP   7 months ago Mild persistent asthma, unspecified whether complicated   [place], [place]   8 months ago Recurrent sinusitis   [facility] Merrilyn Puma, Jerrel Ivory, NP

## 2019-11-15 NOTE — Telephone Encounter (Signed)
The pt was notified of recommendation and she verbalize understanding, no questions or concerns.

## 2019-11-15 NOTE — Telephone Encounter (Signed)
Will send in a refill on the valtrex for continued new vesicles.  It is important that she doesn't put any lotion or cream on them.  We want them to dry out completely so they will begin to resolve.

## 2019-11-15 NOTE — Telephone Encounter (Signed)
Copied from [place] [id number]. Topic: General - Other >> Nov 15, 2019  1:13 PM Leward Quan A wrote: Reason for CRM: Patient called to inform Cyndia Skeeters that she is still having issues from the Shingles that is why she need a refill on the valACYclovir (VALTREX) 1000 MG tablet or if she could get something sent to the pharmacy today please. Please advise    Pt state she possible have 5 new vesicles on the left side of her back with severe itching. She is requesting a refill on the Valtrex. Please advise

## 2019-11-24 ENCOUNTER — Telehealth: Payer: Medicare Other

## 2019-11-24 ENCOUNTER — Ambulatory Visit: Payer: Self-pay | Admitting: Pharmacist

## 2019-11-24 NOTE — Chronic Care Management (AMB) (Signed)
  Chronic Care Management   Follow Up Note   11/24/2019 Name: Michele Newton MRN: [id number] DOB: 1955/07/09  Referred by: Verl Bangs, FNP Reason for referral : Chronic Care Management (Unsuccessful Patient Outreach #3)   Michele Newton is a 64 y.o. year old female who is a primary care patient of Lorine Bears, Lupita Raider, FNP. The CCM team was consulted for assistance with chronic disease management and care coordination needs.    Was unable to reach patient via telephone today and have left HIPAA compliant voicemail asking patient to return my call. Outreach attempt #3.  Plan  The care management team is available to follow up with the patient after provider conversation with the patient regarding recommendation for care management engagement and subsequent re-referral to the care management team.   Harlow Asa, PharmD, [place] [phone]

## 2019-11-25 ENCOUNTER — Other Ambulatory Visit: Payer: Self-pay

## 2019-11-25 ENCOUNTER — Ambulatory Visit (INDEPENDENT_AMBULATORY_CARE_PROVIDER_SITE_OTHER): Payer: Medicare Other | Admitting: Family Medicine

## 2019-11-25 ENCOUNTER — Encounter: Payer: Self-pay | Admitting: Family Medicine

## 2019-11-25 VITALS — BP 131/83 | HR 73 | Temp 98.2°F | Resp 16 | Ht 66.0 in | Wt 201.4 lb

## 2019-11-25 DIAGNOSIS — J4520 Mild intermittent asthma, uncomplicated: Secondary | ICD-10-CM

## 2019-11-25 DIAGNOSIS — B029 Zoster without complications: Secondary | ICD-10-CM

## 2019-11-25 MED ORDER — PREDNISONE 20 MG PO TABS: 40.0000 mg | ORAL_TABLET | Freq: Every day | ORAL | 0 refills | Status: AC

## 2019-11-25 MED ORDER — ALBUTEROL SULFATE HFA 108 (90 BASE) MCG/ACT IN AERS: 1.0000 | INHALATION_SPRAY | Freq: Four times a day (QID) | RESPIRATORY_TRACT | 1 refills | Status: DC | PRN

## 2019-11-25 MED ORDER — FLUTICASONE-SALMETEROL 100-50 MCG/DOSE IN AEPB: 1.0000 | INHALATION_SPRAY | Freq: Two times a day (BID) | RESPIRATORY_TRACT | 3 refills | Status: DC

## 2019-11-25 NOTE — Progress Notes (Signed)
Subjective:    Patient ID: Michele Newton, female    DOB: 07-May-1955, 64 y.o.   MRN: [id number]  Michele Newton is a 64 y.o. female presenting on 11/25/2019 for Herpes Zoster (pt state after to rounds of antiviral medication she is still having more vesicles to appear on her left shoulder and under the left breast.  She also complains of intense itching. )   HPI  Michele Newton presents to clinic for re-evaluation of her herpes zoster.  Reports that she has completed the antiviral therapy but is still having vesicles appear on her left shoulder and under her left breast.  Has concerns for itching.    Depression screen [facility] 2/9 09/15/2019 03/25/2019 11/16/2018  Decreased Interest 0 0 1  Down, Depressed, Hopeless 0 0 1  PHQ - 2 Score 0 0 2  Altered sleeping 0 - 3  Tired, decreased energy 0 - 3  Change in appetite 0 - 2  Feeling bad or failure about yourself  0 - 0  Trouble concentrating 0 - 2  Moving slowly or fidgety/restless 0 - 1  Suicidal thoughts 0 - 0  PHQ-9 Score 0 - 13  Difficult doing work/chores - - Somewhat difficult    Social History   Tobacco Use  . Smoking status: Never Smoker  . Smokeless tobacco: Never Used  Vaping Use  . Vaping Use: Never used  Substance Use Topics  . Alcohol use: No  . Drug use: No    Review of Systems  Constitutional: Negative.   HENT: Negative.   Eyes: Negative.   Respiratory: Negative.   Cardiovascular: Negative.   Gastrointestinal: Negative.   Endocrine: Negative.   Genitourinary: Negative.   Musculoskeletal: Negative.   Skin: Positive for rash. Negative for color change, pallor and wound.       Itching  Allergic/Immunologic: Negative.   Neurological: Negative.   Hematological: Negative.   Psychiatric/Behavioral: Negative.    Per HPI unless specifically indicated above     Objective:    BP 131/83 (BP Location: Right Arm, Patient Position: Sitting, Cuff Size: Normal)   Pulse 73   Temp 98.2 F (36.8 C) (Oral)   Resp 16   Ht 5\' 6"   (1.676 m)   Wt 201 lb 6.4 oz (91.4 kg)   SpO2 99%   BMI 32.51 kg/m   Wt Readings from Last 3 Encounters:  11/25/19 201 lb 6.4 oz (91.4 kg)  10/26/19 200 lb 9.6 oz (91 kg)  09/15/19 198 lb 9.6 oz (90.1 kg)    Physical Exam Vitals reviewed.  Constitutional:      General: She is not in acute distress.    Appearance: Normal appearance. She is well-developed and well-groomed. She is obese. She is not ill-appearing or toxic-appearing.  HENT:     Head: Normocephalic.     Nose:     Comments: Michele Newton is in place, covering mouth and nose  Eyes:     General: Lids are normal. Vision grossly intact.        Right eye: No discharge.        Left eye: No discharge.     Extraocular Movements: Extraocular movements intact.     Conjunctiva/sclera: Conjunctivae normal.     Pupils: Pupils are equal, round, and reactive to light.  Cardiovascular:     Pulses: Normal pulses.  Pulmonary:     Effort: Pulmonary effort is normal. No respiratory distress.  Musculoskeletal:     Right lower leg: No edema.  Left lower leg: No edema.  Skin:    General: Skin is warm and dry.     Capillary Refill: Capillary refill takes less than 2 seconds.     Findings: Lesion and rash present.     Comments: Resolving vesicular rash.  Lesions x 3 on left upper abdomen, under breast, with excoriations.  Lesions x 2 upper left back with excoriations.  No new vesicles or various stages of healing.  Neurological:     General: No focal deficit present.     Mental Status: She is alert and oriented to person, place, and time.     Cranial Nerves: No cranial nerve deficit.     Sensory: No sensory deficit.     Motor: No weakness.     Coordination: Coordination normal.     Gait: Gait normal.  Psychiatric:        Attention and Perception: Attention and perception normal.        Mood and Affect: Mood and affect normal.        Speech: Speech normal.        Behavior: Behavior normal. Behavior is cooperative.        Thought  Content: Thought content normal.        Cognition and Memory: Cognition and memory normal.        Judgment: Judgment normal.    Results for orders placed or performed in visit on 08/09/19  POCT glycosylated hemoglobin (Hb A1C)  Result Value Ref Range   Hemoglobin A1C 5.5 4.0 - 5.6 %      Assessment & Plan:   Problem List Items Addressed This Visit      Respiratory   Mild intermittent asthma without complication    Reports has stopped taking her Breo Ellipta inhaler, was having hoarseness from her inhaler.  Reports since she has stopped taking it, that has resolved but she does not have great control over her symptoms and is relying on her rescue inhaler more.  Is open to trying another daily medication, Advair, and assessing her hoarseness.  Plan: 1. Begin advair 1 puff 2x daily 2. Rescue inhaler refill sent to pharmacy 3. RTC in 3 months for re-evaluation, or sooner if finding using rescue inhaler more than 2x per week      Relevant Medications   predniSONE (DELTASONE) 20 MG tablet   Fluticasone-Salmeterol (ADVAIR) 100-50 MCG/DOSE AEPB   albuterol (PROAIR HFA) 108 (90 Base) MCG/ACT inhaler     Other   Herpes zoster - Primary    Resolving herpes zoster.  No new vesicles, resolving vesicles with excoriations noted.  Reports itching, likely from nerve involvement.  No relief with gabapentin and allergic to amitriptyline.  Will treat with 5 day course of prednisone and re-evaluate.  Plan: 1. Begin prednisone 40mg  daily x 5 days 2. RTC if symptoms worsen or fail to improve      Relevant Medications   predniSONE (DELTASONE) 20 MG tablet      Meds ordered this encounter  Medications  . predniSONE (DELTASONE) 20 MG tablet    Sig: Take 2 tablets (40 mg total) by mouth daily with breakfast for 5 days.    Dispense:  10 tablet    Refill:  0  . Fluticasone-Salmeterol (ADVAIR) 100-50 MCG/DOSE AEPB    Sig: Inhale 1 puff into the lungs 2 (two) times daily.    Dispense:  1 each     Refill:  3  . albuterol (PROAIR HFA) 108 (90 Base) MCG/ACT inhaler  Sig: Inhale 1-2 puffs into the lungs every 6 (six) hours as needed for wheezing or shortness of breath.    Dispense:  6.7 g    Refill:  1      Follow up plan: Return in about 3 months (around 02/25/2020) for Asthma follow up visit.   Harlin Rain, [place] Family Nurse Practitioner [facility] 11/25/2019, 10:40 AM

## 2019-11-25 NOTE — Assessment & Plan Note (Signed)
Reports has stopped taking her Breo Ellipta inhaler, was having hoarseness from her inhaler.  Reports since she has stopped taking it, that has resolved but she does not have great control over her symptoms and is relying on her rescue inhaler more.  Is open to trying another daily medication, Advair, and assessing her hoarseness.  Plan: 1. Begin advair 1 puff 2x daily 2. Rescue inhaler refill sent to pharmacy 3. RTC in 3 months for re-evaluation, or sooner if finding using rescue inhaler more than 2x per week

## 2019-11-25 NOTE — Patient Instructions (Signed)
I have sent in a prescription for Prednisone to take 40mg  daily for the next 5 days.  If you have continued itching of the skin where your shingles were, please contact us and we can discuss additional treatment options.  I have sent in a prescription for Advair, to take 1 puff 2x per day, to use in place of the Breo inhaler that you are unable to tolerate.  We will plan to see you back in 3 months for asthma inhaler follow up visit  You will receive a survey after today's visit either digitally by e-mail or paper by [place] mail. Your experiences and feedback matter to [place].  Please respond so we know how we are doing as we provide care for you.  Call us with any questions/concerns/needs.  It is my goal to be available to you for your health concerns.  Thanks for choosing me to be a partner in your healthcare needs!  Harlin Rain, FNP-C Family Nurse Practitioner [facility] Phone: [phone]

## 2019-11-25 NOTE — Assessment & Plan Note (Signed)
Resolving herpes zoster.  No new vesicles, resolving vesicles with excoriations noted.  Reports itching, likely from nerve involvement.  No relief with gabapentin and allergic to amitriptyline.  Will treat with 5 day course of prednisone and re-evaluate.  Plan: 1. Begin prednisone 40mg  daily x 5 days 2. RTC if symptoms worsen or fail to improve

## 2019-12-30 ENCOUNTER — Other Ambulatory Visit: Payer: Self-pay | Admitting: Family Medicine

## 2019-12-30 DIAGNOSIS — F419 Anxiety disorder, unspecified: Secondary | ICD-10-CM

## 2019-12-30 DIAGNOSIS — G4709 Other insomnia: Secondary | ICD-10-CM

## 2019-12-30 DIAGNOSIS — J4520 Mild intermittent asthma, uncomplicated: Secondary | ICD-10-CM

## 2019-12-30 NOTE — Telephone Encounter (Signed)
Requested medication (s) are due for refill today: Yes  Requested medication (s) are on the active medication list: Yes  Last refill:  10/01/19  Future visit scheduled: No  Notes to clinic:  See request.    Requested Prescriptions  Pending Prescriptions Disp Refills   QUEtiapine (SEROQUEL) 200 MG tablet [Pharmacy Med Name: QUETIAPINE 200MG  TABLETS] 90 tablet 0    Sig: TAKE 1 TABLET BY MOUTH AT BEDTIME      Not Delegated - Psychiatry:  Antipsychotics - Second Generation (Atypical) - quetiapine Failed - 12/30/2019  7:09 AM      Failed - This refill cannot be delegated      Failed - ALT in normal range and within 180 days    ALT  Date Value Ref Range Status  10/09/2017 26 6 - 29 U/L Final          Failed - AST in normal range and within 180 days    AST  Date Value Ref Range Status  10/09/2017 19 10 - 35 U/L Final          Passed - Completed PHQ-2 or PHQ-9 in the last 360 days.      Passed - Last BP in normal range    BP Readings from Last 1 Encounters:  11/25/19 131/83          Passed - Valid encounter within last 6 months    Recent Outpatient Visits           1 month ago Herpes zoster without complication   [facility], Lupita Raider, FNP   2 months ago Herpes zoster without complication   [place], FNP   3 months ago Left wrist pain   [facility], Lupita Raider, FNP   4 months ago Chronic bilateral low back pain without sciatica   [facility], Lupita Raider, FNP   9 months ago Mild persistent asthma, unspecified whether complicated   [place], [place]               Signed Prescriptions Disp Refills   PARoxetine (PAXIL) 40 MG tablet 90 tablet 0    Sig: TAKE 1 TABLET BY MOUTH AT BEDTIME      Psychiatry:  Antidepressants - SSRI Passed - 12/30/2019  7:09 AM      Passed - Completed PHQ-2 or PHQ-9 in the last 360 days.      Passed -  Valid encounter within last 6 months    Recent Outpatient Visits           1 month ago Herpes zoster without complication   [facility], Lupita Raider, FNP   2 months ago Herpes zoster without complication   [place], FNP   3 months ago Left wrist pain   [facility], Lupita Raider, FNP   4 months ago Chronic bilateral low back pain without sciatica   [facility], Lupita Raider, FNP   9 months ago Mild persistent asthma, unspecified whether complicated   [place], [place]                montelukast (SINGULAIR) 10 MG tablet 90 tablet 0    Sig: TAKE 1 TABLET BY MOUTH AT BEDTIME      Pulmonology:  Leukotriene Inhibitors Passed - 12/30/2019  7:09 AM  Passed - Valid encounter within last 12 months    Recent Outpatient Visits           1 month ago Herpes zoster without complication   [facility], Lupita Raider, FNP   2 months ago Herpes zoster without complication   [place], FNP   3 months ago Left wrist pain   [facility], Lupita Raider, FNP   4 months ago Chronic bilateral low back pain without sciatica   [facility], Lupita Raider, FNP   9 months ago Mild persistent asthma, unspecified whether complicated   [facility] [place], [place], [place]

## 2020-02-23 ENCOUNTER — Encounter: Payer: Self-pay | Admitting: Family Medicine

## 2020-02-23 ENCOUNTER — Ambulatory Visit (INDEPENDENT_AMBULATORY_CARE_PROVIDER_SITE_OTHER): Payer: Medicare Other | Admitting: Family Medicine

## 2020-02-23 ENCOUNTER — Other Ambulatory Visit: Payer: Self-pay

## 2020-02-23 DIAGNOSIS — J45901 Unspecified asthma with (acute) exacerbation: Secondary | ICD-10-CM

## 2020-02-23 DIAGNOSIS — R059 Cough, unspecified: Secondary | ICD-10-CM | POA: Insufficient documentation

## 2020-02-23 MED ORDER — GUAIFENESIN-CODEINE 100-10 MG/5ML PO SOLN: 5.0000 mL | Freq: Three times a day (TID) | ORAL | 0 refills | Status: DC | PRN

## 2020-02-23 MED ORDER — PREDNISONE 50 MG PO TABS: ORAL_TABLET | ORAL | 0 refills | Status: DC

## 2020-02-23 NOTE — Patient Instructions (Signed)
I have sent in a prescription for Prednisone 50mg , to take 1 tablet daily for the next 5 days.  I have sent in a prescription for Robitussin AC to take 51mL every 8 hours as needed for cough.  Be sure not to drive or operate heavy machinery while taking this medication as it can cause sedation.   Continue taking your mucinex and flonase according to their packaging directions.  Can continue your albuterol inhaler 1-2 puffs every 4-6 hours as needed for cough, shortness of breath and/or wheezing.  If you begin to have worsening shortness of breath, chest pain, fever over 104 that is not responsive to ibuprofen and/or acetaminophen, or impending sense of doom to Michele Newton!  We will plan to see you back if your symptoms worsen or fail to improve  You will receive a survey after today's visit either digitally by e-mail or paper by USPS mail. Your experiences and feedback matter to [place].  Please respond so we know how we are doing as we provide care for you.  Call us with any questions/concerns/needs.  It is my goal to be available to you for your health concerns.  Thanks for choosing me to be a partner in your healthcare needs!  Harlin Rain, FNP-C Family Nurse Practitioner [facility] Phone: [phone]

## 2020-02-23 NOTE — Progress Notes (Signed)
Virtual Visit via Telephone  The purpose of this virtual visit is to provide medical care while limiting exposure to the novel coronavirus (COVID19) for both patient and office staff.  Consent was obtained for phone visit:  Yes.   Answered questions that patient had about telehealth interaction:  Yes.   I discussed the limitations, risks, security and privacy concerns of performing an evaluation and management service by telephone. I also discussed with the patient that there may be a patient responsible charge related to this service. The patient expressed understanding and agreed to proceed.  Patient is at home and is accessed via telephone Services are provided by Harlin Rain, FNP-C from [facility])  ---------------------------------------------------------------------- Chief Complaint  Patient presents with  . Cough    runny nose, stuffy nose, post nasal drainage, head congestion  and SOB that she associates with asthma  x 5 days     S: Reviewed CMA documentation. I have called patient and gathered additional HPI as follows:  Ms. Beever presents for virtual telemedicine visit for concerns of nasal congestion, rhinorrhea, post nasal drainage, head congestion and shortness of breath that she attributes to a viral infection her husband and grandkids have had.  Reports her husband was tested for COVID and was negative.  Has concerns for cough that is keeping her up at night.  She has been taking mucinex, flonase and her albuterol inhaler as needed with some symptom relief.  Denies fevers, sore throat, change in taste/smell, DOE, CP, abdominal pain, n/v/d.  Has not been tested for COVID.  Patient is currently home Denies any high risk travel to areas of current concern for COVID19. Denies any known or suspected exposure to person with or possibly with COVID19.  Past Medical History:  Diagnosis Date  . Anxiety   . Arthritis   . Asthma   . Depression   .  Frequent headaches   . GERD (gastroesophageal reflux disease)   . Migraine   . Psoriatic arthritis ([place])    Social History   Tobacco Use  . Smoking status: Never Smoker  . Smokeless tobacco: Never Used  Vaping Use  . Vaping Use: Never used  Substance Use Topics  . Alcohol use: No  . Drug use: No    Current Outpatient Medications:  .  albuterol (PROAIR HFA) 108 (90 Base) MCG/ACT inhaler, Inhale 1-2 puffs into the lungs every 6 (six) hours as needed for wheezing or shortness of breath., Disp: 6.7 g, Rfl: 1 .  fluticasone (FLONASE) 50 MCG/ACT nasal spray, Place 2 sprays into both nostrils daily. Use for 4-6 weeks then stop and use seasonally or as needed., Disp: 16 g, Rfl: 3 .  levocetirizine (XYZAL) 5 MG tablet, Take by mouth., Disp: , Rfl:  .  montelukast (SINGULAIR) 10 MG tablet, TAKE 1 TABLET BY MOUTH AT BEDTIME, Disp: 90 tablet, Rfl: 0 .  pantoprazole (PROTONIX) 40 MG tablet, TAKE 1 TABLET(40 MG) BY MOUTH TWICE DAILY, Disp: 180 tablet, Rfl: 0 .  PARoxetine (PAXIL) 40 MG tablet, TAKE 1 TABLET BY MOUTH AT BEDTIME, Disp: 90 tablet, Rfl: 0 .  QUEtiapine (SEROQUEL) 200 MG tablet, TAKE 1 TABLET BY MOUTH AT BEDTIME, Disp: 90 tablet, Rfl: 0 .  verapamil (CALAN) 120 MG tablet, Take 120 mg by mouth daily., Disp: , Rfl:  .  Fluticasone-Salmeterol (ADVAIR) 100-50 MCG/DOSE AEPB, Inhale 1 puff into the lungs 2 (two) times daily. (Patient not taking: Reported on 02/23/2020), Disp: 1 each, Rfl: 3 .  guaiFENesin-codeine  100-10 MG/5ML syrup, Take 5 mLs by mouth 3 (three) times daily as needed for cough., Disp: 120 mL, Rfl: 0 .  predniSONE (DELTASONE) 50 MG tablet, Take 1 tablet daily x 5 days, Disp: 5 tablet, Rfl: 0 .  valsartan (DIOVAN) 160 MG tablet, valsartan 160 mg tablet (Patient not taking: Reported on 02/23/2020), Disp: , Rfl:   Depression screen [facility] 2/9 09/15/2019 03/25/2019 11/16/2018  Decreased Interest 0 0 1  Down, Depressed, Hopeless 0 0 1  PHQ - 2 Score 0 0 2  Altered sleeping 0 - 3   Tired, decreased energy 0 - 3  Change in appetite 0 - 2  Feeling bad or failure about yourself  0 - 0  Trouble concentrating 0 - 2  Moving slowly or fidgety/restless 0 - 1  Suicidal thoughts 0 - 0  PHQ-9 Score 0 - 13  Difficult doing work/chores - - Somewhat difficult    GAD 7 : Generalized Anxiety Score 11/16/2018 05/12/2018 10/01/2015  Nervous, Anxious, on Edge 2 3 3   Control/stop worrying 1 3 2   Worry too much - different things 1 1 2   Trouble relaxing 1 3 2   Restless 0 0 0  Easily annoyed or irritable 2 3 3   Afraid - awful might happen 1 0 1  Total GAD 7 Score 8 13 13   Anxiety Difficulty - Not difficult at all Somewhat difficult    -------------------------------------------------------------------------- O: No physical exam performed due to remote telephone encounter.  Physical Exam: Patient remotely monitored without video.  Verbal communication appropriate.  Cognition normal.  No results found for this or any previous visit (from the past 2160 hour(s)).  -------------------------------------------------------------------------- A&P:  Problem List Items Addressed This Visit      Respiratory   Exacerbation of asthma    See Cough A/P      Relevant Medications   predniSONE (DELTASONE) 50 MG tablet   guaiFENesin-codeine 100-10 MG/5ML syrup     Other   Cough - Primary    Secondary to asthma exacerbation, likely from viral infection.  Has been exposed to husband (who has tested negative for COVID) and to grandchildren who have had a viral infection.  Will treat with prednisone, robitussin AC, can continue flonase, mucinex and albuterol inhaler as directed.  Strict ER precautions.  Plan: 1. Begin prednisone 50mg  daily x 5 days 2. Begin robitussin AC 60mL up to 3x per day as needed for cough 3. Continue mucinex and flonase, according to packaging directions 4. Can use albuterol inhaler 1-2 puffs every 4-6 hours as needed for cough, SOB and wheezing 5. Strict ER  precautions 6. RTC PRN      Relevant Medications   predniSONE (DELTASONE) 50 MG tablet   guaiFENesin-codeine 100-10 MG/5ML syrup      Meds ordered this encounter  Medications  . predniSONE (DELTASONE) 50 MG tablet    Sig: Take 1 tablet daily x 5 days    Dispense:  5 tablet    Refill:  0  . guaiFENesin-codeine 100-10 MG/5ML syrup    Sig: Take 5 mLs by mouth 3 (three) times daily as needed for cough.    Dispense:  120 mL    Refill:  0    Follow-up: - Return if symptoms worsen or fail to improve  Patient verbalizes understanding with the above medical recommendations including the limitation of remote medical advice.  Specific follow-up and call-back criteria were given for patient to follow-up or seek medical care more urgently if needed.  - Time spent  in direct consultation with patient on phone: 5 minutes  Harlin Rain, [facility] 02/23/2020, 9:36 AM

## 2020-02-23 NOTE — Assessment & Plan Note (Signed)
See Cough A/P

## 2020-02-23 NOTE — Assessment & Plan Note (Signed)
Secondary to asthma exacerbation, likely from viral infection.  Has been exposed to husband (who has tested negative for COVID) and to grandchildren who have had a viral infection.  Will treat with prednisone, robitussin AC, can continue flonase, mucinex and albuterol inhaler as directed.  Strict ER precautions.  Plan: 1. Begin prednisone 50mg  daily x 5 days 2. Begin robitussin AC 66mL up to 3x per day as needed for cough 3. Continue mucinex and flonase, according to packaging directions 4. Can use albuterol inhaler 1-2 puffs every 4-6 hours as needed for cough, SOB and wheezing 5. Strict ER precautions 6. RTC PRN

## 2020-02-29 ENCOUNTER — Ambulatory Visit (INDEPENDENT_AMBULATORY_CARE_PROVIDER_SITE_OTHER): Payer: Medicare Other | Admitting: Family Medicine

## 2020-02-29 ENCOUNTER — Other Ambulatory Visit: Payer: Self-pay

## 2020-02-29 ENCOUNTER — Encounter: Payer: Self-pay | Admitting: Family Medicine

## 2020-02-29 VITALS — BP 127/84 | HR 83 | Temp 98.5°F | Resp 18 | Ht 66.0 in | Wt 205.6 lb

## 2020-02-29 DIAGNOSIS — F419 Anxiety disorder, unspecified: Secondary | ICD-10-CM

## 2020-02-29 DIAGNOSIS — F5104 Psychophysiologic insomnia: Secondary | ICD-10-CM | POA: Diagnosis not present

## 2020-02-29 DIAGNOSIS — I10 Essential (primary) hypertension: Secondary | ICD-10-CM

## 2020-02-29 DIAGNOSIS — R0602 Shortness of breath: Secondary | ICD-10-CM | POA: Diagnosis not present

## 2020-02-29 DIAGNOSIS — F32A Depression, unspecified: Secondary | ICD-10-CM

## 2020-02-29 DIAGNOSIS — J4520 Mild intermittent asthma, uncomplicated: Secondary | ICD-10-CM | POA: Diagnosis not present

## 2020-02-29 MED ORDER — FLUTICASONE-SALMETEROL 250-50 MCG/DOSE IN AEPB: 1.0000 | INHALATION_SPRAY | Freq: Every day | RESPIRATORY_TRACT | 1 refills | Status: DC

## 2020-02-29 MED ORDER — BUDESONIDE-FORMOTEROL FUMARATE 160-4.5 MCG/ACT IN AERO: 2.0000 | INHALATION_SPRAY | Freq: Two times a day (BID) | RESPIRATORY_TRACT | 3 refills | Status: DC

## 2020-02-29 MED ORDER — VERAPAMIL HCL 120 MG PO TABS: 120.0000 mg | ORAL_TABLET | Freq: Every day | ORAL | 1 refills | Status: DC

## 2020-02-29 MED ORDER — TRAZODONE HCL 50 MG PO TABS: 25.0000 mg | ORAL_TABLET | Freq: Every evening | ORAL | 3 refills | Status: DC | PRN

## 2020-02-29 MED ORDER — ARIPIPRAZOLE 2 MG PO TABS: 2.0000 mg | ORAL_TABLET | Freq: Every day | ORAL | 1 refills | Status: DC

## 2020-02-29 NOTE — Assessment & Plan Note (Signed)
Not present at time of visit, reports has been hearing wheezing, lungs CTA, likely secondary to post nasal drainage and hearing breathe through secretions in throat.  Reassured no concerns for bronchitis or pneumonia at this time.

## 2020-02-29 NOTE — Assessment & Plan Note (Signed)
Needing refill on verapamil.  Rx sent to pharmacy on file.

## 2020-02-29 NOTE — Assessment & Plan Note (Signed)
Reports she has previously been on Abilify 2mg  and has had refills left over, has restarted on this medication within the past few weeks with improvement in anxiety/depression.  Requesting to restart.  Refills sent to pharmacy on file.

## 2020-02-29 NOTE — Assessment & Plan Note (Signed)
Reports advair is not on formulary, will switch to symbicort.  Discussed if not approved on formulary to contact insurance company to find out list of preferred medications.

## 2020-02-29 NOTE — Patient Instructions (Addendum)
I have sent in a prescription for Trazodone to take 25-50mg  (1/2 to 1 tablet) an hour before bed to help with sleep.  Be sure not to drive or operate heavy machinery while taking this medication as it can be sedating.  I have sent in a prescription for symbicort to take as directed.  If this isn't covered please ask your insurance company for a copy of their formulary to find out their preferred medications.  We will plan to see you back if your symptoms worsen or fail to improve  You will receive a survey after today's visit either digitally by e-mail or paper by USPS mail. Your experiences and feedback matter to [place].  Please respond so we know how we are doing as we provide care for you.  Call us with any questions/concerns/needs.  It is my goal to be available to you for your health concerns.  Thanks for choosing me to be a partner in your healthcare needs!  Harlin Rain, FNP-C Family Nurse Practitioner [facility] Phone: [phone]

## 2020-02-29 NOTE — Progress Notes (Signed)
Subjective:    Patient ID: Michele Newton, female    DOB: 12/29/1955, 64 y.o.   MRN: [id number]  Michele Newton is a 64 y.o. female presenting on 02/29/2020 for Cough (SOB, wheezing, left ear pain, and insomnia due to the cough x 1 week )   HPI  Michele Newton presents to clinic for concerns of shortness of breath, wheezing, left ear pain, and insomnia due to cough x 1 week.  Has been getting over a viral infection and has been progressively improving.  She has concerns for her lungs and sleep disturbances.  Has been taking Robitussin AC with some help with sleep but the cough has still woken her up.  Denies DOE, has stopped her Memory Dance and was prescribed Advair to begin but it is not on formulary with her insurance.  So has been without a maintenance inhaler.  Depression screen [facility] 2/9 09/15/2019 03/25/2019 11/16/2018  Decreased Interest 0 0 1  Down, Depressed, Hopeless 0 0 1  PHQ - 2 Score 0 0 2  Altered sleeping 0 - 3  Tired, decreased energy 0 - 3  Change in appetite 0 - 2  Feeling bad or failure about yourself  0 - 0  Trouble concentrating 0 - 2  Moving slowly or fidgety/restless 0 - 1  Suicidal thoughts 0 - 0  PHQ-9 Score 0 - 13  Difficult doing work/chores - - Somewhat difficult    Social History   Tobacco Use  . Smoking status: Never Smoker  . Smokeless tobacco: Never Used  Vaping Use  . Vaping Use: Never used  Substance Use Topics  . Alcohol use: No  . Drug use: No    Review of Systems  Constitutional: Negative.   HENT: Positive for ear pain. Negative for congestion, dental problem, drooling, ear discharge, facial swelling, hearing loss, mouth sores, nosebleeds, postnasal drip, rhinorrhea, sinus pressure, sinus pain, sneezing, sore throat, tinnitus, trouble swallowing and voice change.   Eyes: Negative.   Respiratory: Positive for cough and shortness of breath. Negative for apnea, choking, chest tightness, wheezing and stridor.   Cardiovascular: Negative.   Gastrointestinal:  Negative.   Endocrine: Negative.   Genitourinary: Negative.   Musculoskeletal: Negative.   Skin: Negative.   Allergic/Immunologic: Negative.   Neurological: Negative.   Hematological: Negative.   Psychiatric/Behavioral: Positive for sleep disturbance. Negative for agitation, behavioral problems, confusion, decreased concentration, dysphoric mood, hallucinations, self-injury and suicidal ideas. The patient is not nervous/anxious and is not hyperactive.    Per HPI unless specifically indicated above     Objective:    BP 127/84 (BP Location: Left Arm, Patient Position: Sitting, Cuff Size: Large)   Pulse 83   Temp 98.5 F (36.9 C) (Oral)   Resp 18   Ht 5\' 6"  (1.676 m)   Wt 205 lb 9.6 oz (93.3 kg)   SpO2 100%   BMI 33.18 kg/m   Wt Readings from Last 3 Encounters:  02/29/20 205 lb 9.6 oz (93.3 kg)  11/25/19 201 lb 6.4 oz (91.4 kg)  10/26/19 200 lb 9.6 oz (91 kg)    Physical Exam Vitals and nursing note reviewed.  Constitutional:      General: She is not in acute distress.    Appearance: Normal appearance. She is well-developed and well-groomed. She is obese. She is not ill-appearing or toxic-appearing.  HENT:     Head: Normocephalic and atraumatic.     Right Ear: Hearing, ear canal and external ear normal. 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  Left Ear: Hearing, tympanic membrane, ear canal and external ear normal.     Nose:     Comments: Facemask is in place, covering mouth and nose. Eyes:     General: Lids are normal. Vision grossly intact.        Right eye: No discharge.        Left eye: No discharge.     Extraocular Movements: Extraocular movements intact.     Conjunctiva/sclera: Conjunctivae normal.     Pupils: Pupils are equal, round, and reactive to light.  Cardiovascular:     Rate and Rhythm: Normal rate and regular rhythm.     Pulses: Normal pulses.     Heart sounds: Normal heart sounds. No murmur heard.  No friction rub. No gallop.   Pulmonary:     Effort:  Pulmonary effort is normal. No respiratory distress.     Breath sounds: Normal breath sounds.  Musculoskeletal:     Right lower leg: No edema.     Left lower leg: No edema.  Skin:    General: Skin is warm and dry.     Capillary Refill: Capillary refill takes less than 2 seconds.  Neurological:     General: No focal deficit present.     Mental Status: She is alert and oriented to person, place, and time.  Psychiatric:        Attention and Perception: Attention and perception normal.        Mood and Affect: Mood and affect normal.        Speech: Speech normal.        Behavior: Behavior normal. Behavior is cooperative.        Thought Content: Thought content normal.        Cognition and Memory: Cognition and memory normal.        Judgment: Judgment normal.    Results for orders placed or performed in visit on 08/09/19  POCT glycosylated hemoglobin (Hb A1C)  Result Value Ref Range   Hemoglobin A1C 5.5 4.0 - 5.6 %      Assessment & Plan:   Problem List Items Addressed This Visit      Cardiovascular and Mediastinum   Essential hypertension    Needing refill on verapamil.  Rx sent to pharmacy on file.      Relevant Medications   verapamil (CALAN) 120 MG tablet     Respiratory   Mild intermittent asthma without complication    Reports advair is not on formulary, will switch to symbicort.  Discussed if not approved on formulary to contact insurance company to find out list of preferred medications.      Relevant Medications   budesonide-formoterol (SYMBICORT) 160-4.5 MCG/ACT inhaler     Other   Anxiety and depression    Reports she has previously been on Abilify 2mg  and has had refills left over, has restarted on this medication within the past few weeks with improvement in anxiety/depression.  Requesting to restart.  Refills sent to pharmacy on file.      Relevant Medications   traZODone (DESYREL) 50 MG tablet   ARIPiprazole (ABILIFY) 2 MG tablet   Shortness of breath -  Primary    Not present at time of visit, reports has been hearing wheezing, lungs CTA, likely secondary to post nasal drainage and hearing breathe through secretions in throat.  Reassured no concerns for bronchitis or pneumonia at this time.      Psychophysiological insomnia    Has a history of insomnia, has taken  ambien in the past with sleep walking.  Discussed low dose trazodone to try 25-50mg  an hour before bedtime.  To review sleep hygiene handout.  Plan: 1. Begin trazodone 25-50mg  an hour before bedtime to help with sleep 2. Review sleep hygiene 3. RTC PRN      Relevant Medications   traZODone (DESYREL) 50 MG tablet      Meds ordered this encounter  Medications  . traZODone (DESYREL) 50 MG tablet    Sig: Take 0.5-1 tablets (25-50 mg total) by mouth at bedtime as needed for sleep.    Dispense:  30 tablet    Refill:  3  . DISCONTD: Fluticasone-Salmeterol (ADVAIR) 250-50 MCG/DOSE AEPB    Sig: Inhale 1 puff into the lungs daily.    Dispense:  60 each    Refill:  1  . verapamil (CALAN) 120 MG tablet    Sig: Take 1 tablet (120 mg total) by mouth daily.    Dispense:  90 tablet    Refill:  1  . budesonide-formoterol (SYMBICORT) 160-4.5 MCG/ACT inhaler    Sig: Inhale 2 puffs into the lungs 2 (two) times daily.    Dispense:  1 each    Refill:  3  . ARIPiprazole (ABILIFY) 2 MG tablet    Sig: Take 1 tablet (2 mg total) by mouth daily.    Dispense:  90 tablet    Refill:  1   Follow up plan: Return if symptoms worsen or fail to improve.   Harlin Rain, [place] [facility] Nurse Practitioner [facility] 02/29/2020, 4:35 PM

## 2020-02-29 NOTE — Assessment & Plan Note (Signed)
Has a history of insomnia, has taken ambien in the past with sleep walking.  Discussed low dose trazodone to try 25-50mg  an hour before bedtime.  To review sleep hygiene handout.  Plan: 1. Begin trazodone 25-50mg  an hour before bedtime to help with sleep 2. Review sleep hygiene 3. RTC PRN

## 2020-03-13 ENCOUNTER — Encounter: Payer: Self-pay | Admitting: Family Medicine

## 2020-03-13 ENCOUNTER — Other Ambulatory Visit: Payer: Self-pay

## 2020-03-13 ENCOUNTER — Ambulatory Visit (INDEPENDENT_AMBULATORY_CARE_PROVIDER_SITE_OTHER): Payer: Medicare Other | Admitting: Family Medicine

## 2020-03-13 VITALS — BP 144/85 | HR 85 | Temp 99.4°F | Ht 66.0 in | Wt 205.0 lb

## 2020-03-13 DIAGNOSIS — Z6833 Body mass index (BMI) 33.0-33.9, adult: Secondary | ICD-10-CM

## 2020-03-13 DIAGNOSIS — H6691 Otitis media, unspecified, right ear: Secondary | ICD-10-CM | POA: Diagnosis not present

## 2020-03-13 DIAGNOSIS — M25551 Pain in right hip: Secondary | ICD-10-CM | POA: Diagnosis not present

## 2020-03-13 DIAGNOSIS — Z6832 Body mass index (BMI) 32.0-32.9, adult: Secondary | ICD-10-CM | POA: Insufficient documentation

## 2020-03-13 DIAGNOSIS — E669 Obesity, unspecified: Secondary | ICD-10-CM | POA: Diagnosis not present

## 2020-03-13 DIAGNOSIS — E6609 Other obesity due to excess calories: Secondary | ICD-10-CM | POA: Insufficient documentation

## 2020-03-13 MED ORDER — METHOCARBAMOL 500 MG PO TABS: 500.0000 mg | ORAL_TABLET | Freq: Four times a day (QID) | ORAL | 1 refills | Status: AC

## 2020-03-13 MED ORDER — AMOXICILLIN-POT CLAVULANATE 875-125 MG PO TABS: 1.0000 | ORAL_TABLET | Freq: Two times a day (BID) | ORAL | 0 refills | Status: DC

## 2020-03-13 MED ORDER — NALTREXONE-BUPROPION HCL ER 8-90 MG PO TB12
ORAL_TABLET | ORAL | 0 refills | Status: DC
Start: 2020-03-13 — End: 2020-03-13

## 2020-03-13 MED ORDER — NALTREXONE-BUPROPION HCL ER 8-90 MG PO TB12: ORAL_TABLET | ORAL | 0 refills | Status: DC

## 2020-03-13 NOTE — Assessment & Plan Note (Signed)
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  Will treat with augmentin 875-125mg  BID x 10 days.  Discussed if having worsening of symptoms given perforation, we can look into ear drops and review that they are are not neuro-toxic before using to help and possibility of ENT referral after new insurance starts after 04/05/2020.  Patient in agreement with treatment plan.

## 2020-03-13 NOTE — Progress Notes (Signed)
 Subjective:    Patient ID: Michele Newton, female    DOB: 11/22/1955, 64 y.o.   MRN: [id number]  Michele Newton is a 64 y.o. female presenting on 03/13/2020 for Ear Pain (X1 week, right ear, pain comes and goes, drainage coming out of ear )   HPI  Ms. Pindell presents to clinic for concerns of right ear pain x 1 weeks that the pain comes and goes.  Has a history of drainage from right ear overnight.  Has met with ENT regarding her perforated ear drum in the past and reports that she has nightly drainage that sometimes ends up on her pillow case.  She has been told that this is normal with the perforation, and is a release of fluid from behind the ear drum and was discussed to have the ear drum patched but was told to avoid this due to chronic eustachian tube dysfunction and fluid build up in the past.  Denies fevers, dizziness, vertigo, purulent drainage from the ear.  Depression screen PHQ 2/9 03/13/2020 09/15/2019 03/25/2019  Decreased Interest 0 0 0  Down, Depressed, Hopeless 0 0 0  PHQ - 2 Score 0 0 0  Altered sleeping 1 0 -  Tired, decreased energy 0 0 -  Change in appetite 0 0 -  Feeling bad or failure about yourself  0 0 -  Trouble concentrating 0 0 -  Moving slowly or fidgety/restless 0 0 -  Suicidal thoughts 0 0 -  PHQ-9 Score 1 0 -  Difficult doing work/chores Not difficult at all - -  Some recent data might be hidden    Social History   Tobacco Use  . Smoking status: Never Smoker  . Smokeless tobacco: Never Used  Vaping Use  . Vaping Use: Never used  Substance Use Topics  . Alcohol use: No  . Drug use: No    Review of Systems  Constitutional: Negative.   HENT: Positive for ear discharge and ear pain. Negative for congestion, dental problem, drooling, facial swelling, hearing loss, mouth sores, nosebleeds, postnasal drip, rhinorrhea, sinus pressure, sinus pain, sneezing, sore throat, tinnitus, trouble swallowing and voice change.   Eyes: Negative.   Respiratory: Negative.    Cardiovascular: Negative.   Gastrointestinal: Negative.   Endocrine: Negative.   Genitourinary: Negative.   Musculoskeletal: Positive for arthralgias. Negative for back pain, joint swelling, myalgias, neck pain and neck stiffness.  Skin: Negative.   Allergic/Immunologic: Negative.   Neurological: Negative.   Hematological: Negative.   Psychiatric/Behavioral: Negative.    Per HPI unless specifically indicated above     Objective:    BP (!) 144/85   Pulse 85   Temp 99.4 F (37.4 C) (Oral)   Ht 5' 6" (1.676 m)   Wt 205 lb (93 kg)   SpO2 98%   BMI 33.09 kg/m   Wt Readings from Last 3 Encounters:  03/13/20 205 lb (93 kg)  02/29/20 205 lb 9.6 oz (93.3 kg)  11/25/19 201 lb 6.4 oz (91.4 kg)    Physical Exam Vitals and nursing note reviewed.  Constitutional:      General: She is not in acute distress.    Appearance: Normal appearance. She is well-developed and well-groomed. She is not ill-appearing or toxic-appearing.  HENT:     Head: Normocephalic and atraumatic.     Right Ear: Ear canal and external ear normal. No swelling or tenderness. No middle ear effusion. There is no impacted cerumen. Tympanic membrane is injected, perforated and erythematous. Tympanic membrane is   not retracted or bulging.     Left Ear: Tympanic membrane, ear canal and external ear normal. There is no impacted cerumen.     Ears:     Comments: Chronic perforated TM.    Nose:     Comments: Lizbeth Bark is in place, covering mouth and nose. Eyes:     General: Lids are normal. Vision grossly intact.        Right eye: No discharge.        Left eye: No discharge.     Extraocular Movements: Extraocular movements intact.     Conjunctiva/sclera: Conjunctivae normal.     Pupils: Pupils are equal, round, and reactive to light.  Cardiovascular:     Rate and Rhythm: Normal rate and regular rhythm.     Pulses: Normal pulses.     Heart sounds: Normal heart sounds. No murmur heard.  No friction rub. No gallop.     Pulmonary:     Effort: Pulmonary effort is normal. No respiratory distress.     Breath sounds: Normal breath sounds.  Musculoskeletal:     Right lower leg: No edema.     Left lower leg: No edema.  Skin:    General: Skin is warm and dry.     Capillary Refill: Capillary refill takes less than 2 seconds.  Neurological:     General: No focal deficit present.     Mental Status: She is alert and oriented to person, place, and time.  Psychiatric:        Attention and Perception: Attention and perception normal.        Mood and Affect: Mood and affect normal.        Speech: Speech normal.        Behavior: Behavior normal. Behavior is cooperative.        Thought Content: Thought content normal.        Cognition and Memory: Cognition and memory normal.        Judgment: Judgment normal.    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  Will treat with augmentin 875-175m BID x 10 days.  Discussed if having worsening of symptoms given perforation, we can look into ear drops and review that they are are not neuro-toxic before using to help and possibility of ENT referral after new insurance starts after 04/05/2020.  Patient in agreement with treatment plan.      Relevant Medications   amoxicillin-clavulanate (AUGMENTIN) 875-125 MG tablet   Naltrexone-buPROPion HCl ER 8-90 MG TB12     Other   Right hip pain    Right hip pain, previously treated with robaxin 5022mevery 6 hours PRN.  Is due to follow up with Orthopedics for right hip replacement, is trying to make it past Christmas before having surgery.  Ambulating with cane due to increased pain in right hip.  Will refill  robaxin.      Class 1 obesity without serious comorbidity with body mass index (BMI) of 33.0 to 33.9 in adult    Interested in starting Contrave for help with weight management.  Did discuss medication as well as needs  to stop medication prior to upcoming hip surgery as it will interact with the pain medication and make pain medication not effective.  Verbalized understanding.  Encouraged to look into the weight watchers free x 1 month that has been advertised as available with Contrave.  Patient in agreement with treatment plan.      Relevant Medications   Naltrexone-buPROPion HCl ER 8-90 MG TB12      Meds ordered this encounter  Medications  . methocarbamol (ROBAXIN) 500 MG tablet    Sig: Take 1 tablet (500 mg total) by mouth 4 (four) times daily.    Dispense:  120 tablet    Refill:  1  . DISCONTD: Naltrexone-buPROPion HCl ER 8-90 MG TB12    Sig: Start 1 tablet every morning for 7 days, then 1 tablet twice daily for 7 days, then 2 tablets every morning and one in the evening, then 2 tablets twice daily    Dispense:  120 tablet    Refill:  0  . amoxicillin-clavulanate (AUGMENTIN) 875-125 MG tablet    Sig: Take 1 tablet by mouth 2 (two) times daily.    Dispense:  20 tablet    Refill:  0  . Naltrexone-buPROPion HCl ER 8-90 MG TB12    Sig: Start 1 tablet every morning for 7 days, then 1 tablet twice daily for 7 days, then 2 tablets every morning and one in the evening, then 2 tablets twice daily    Dispense:  120 tablet    Refill:  0    Follow up plan: Return if symptoms worsen or fail to improve.   Harlin Rain, [place] Family Nurse Practitioner [facility] 03/13/2020, 4:35 PM

## 2020-03-13 NOTE — Assessment & Plan Note (Signed)
Right hip pain, previously treated with robaxin 500mg  every 6 hours PRN.  Is due to follow up with Orthopedics for right hip replacement, is trying to make it past Christmas before having surgery.  Ambulating with cane due to increased pain in right hip.  Will refill robaxin.

## 2020-03-13 NOTE — Patient Instructions (Signed)
I have sent in Augmentin 875-125mg , to take 1 tablet 2x per day for the next 10 days.  If you find that you are not having symptom relief, it is important we discuss outer ear infection treatments with your right sided perforated ear drum  I have sent in a prescription for contrave, to take as directed.  You are entitled to a free month of weight watchers, if you google Contrave and Weight Watchers there will be more information on this online about signing up  I have sent in a refill on your robaxin for your right hip pain/discomfort.  We will plan to see you back if your symptoms worsen or fail to improve  You will receive a survey after today's visit either digitally by e-mail or paper by USPS mail. Your experiences and feedback matter to [place].  Please respond so we know how we are doing as we provide care for you.  Call us with any questions/concerns/needs.  It is my goal to be available to you for your health concerns.  Thanks for choosing me to be a partner in your healthcare needs!  Harlin Rain, FNP-C Family Nurse Practitioner [facility] Phone: [phone]

## 2020-03-13 NOTE — Assessment & Plan Note (Signed)
Interested in starting Contrave for help with weight management.  Did discuss medication as well as needs to stop medication prior to upcoming hip surgery as it will interact with the pain medication and make pain medication not effective.  Verbalized understanding.  Encouraged to look into the weight watchers free x 1 month that has been advertised as available with Contrave.  Patient in agreement with treatment plan.

## 2020-03-16 ENCOUNTER — Other Ambulatory Visit: Payer: Self-pay | Admitting: Family Medicine

## 2020-03-16 DIAGNOSIS — J4520 Mild intermittent asthma, uncomplicated: Secondary | ICD-10-CM

## 2020-03-16 NOTE — Telephone Encounter (Signed)
Requested Prescriptions  Pending Prescriptions Disp Refills   albuterol (VENTOLIN HFA) 108 (90 Base) MCG/ACT inhaler [Pharmacy Med Name: ALBUTEROL HFA INH (200 PUFFS)6.7GM] 6.7 g 1    Sig: INHALE 1 TO 2 PUFFS INTO THE LUNGS EVERY 6 HOURS AS NEEDED FOR WHEEZING OR SHORTNESS OF BREATH     Pulmonology:  Beta Agonists Failed - 03/16/2020  9:06 AM      Failed - One inhaler should last at least one month. If the patient is requesting refills earlier, contact the patient to check for uncontrolled symptoms.      Passed - Valid encounter within last 12 months    Recent Outpatient Visits          3 days ago Right otitis media, unspecified otitis media type   [place], FNP   2 weeks ago Shortness of breath   [place], FNP   3 weeks ago Cough   [facility], Lupita Raider, FNP   3 months ago Herpes zoster without complication   [facility], Lupita Raider, FNP   4 months ago Herpes zoster without complication   [facility], Lupita Raider, [place]

## 2020-03-29 ENCOUNTER — Other Ambulatory Visit: Payer: Self-pay | Admitting: Family Medicine

## 2020-03-29 DIAGNOSIS — J4520 Mild intermittent asthma, uncomplicated: Secondary | ICD-10-CM

## 2020-03-29 DIAGNOSIS — F419 Anxiety disorder, unspecified: Secondary | ICD-10-CM

## 2020-03-29 DIAGNOSIS — F32A Depression, unspecified: Secondary | ICD-10-CM

## 2020-04-19 ENCOUNTER — Ambulatory Visit: Payer: Self-pay | Admitting: *Deleted

## 2020-04-19 NOTE — Telephone Encounter (Signed)
Patient is calling to report she has had diarrhea for 3 days- she is hydrating- but is having watery stools. Appointment scheduled for tomorrow am- virtual.  Reason for Disposition . [1] MODERATE diarrhea (e.g., 4-6 times / day more than normal) AND [2] present > 48 hours (2 days)  Answer Assessment - Initial Assessment Questions 1. DIARRHEA SEVERITY: "How bad is the diarrhea?" "How many extra stools have you had in the past 24 hours than normal?"    - NO DIARRHEA (SCALE 0)   - MILD (SCALE 1-3): Few loose or mushy BMs; increase of 1-3 stools over normal daily number of stools; mild increase in ostomy output.   -  MODERATE (SCALE 4-7): Increase of 4-6 stools daily over normal; moderate increase in ostomy output. * SEVERE (SCALE 8-10; OR 'WORST POSSIBLE'): Increase of 7 or more stools daily over normal; moderate increase in ostomy output; incontinence.     Mild/moderate 2. ONSET: "When did the diarrhea begin?"      3 days ago 3. BM CONSISTENCY: "How loose or watery is the diarrhea?"      watery 4. VOMITING: "Are you also vomiting?" If Yes, ask: "How many times in the past 24 hours?"      nausea 5. ABDOMINAL PAIN: "Are you having any abdominal pain?" If Yes, ask: "What does it feel like?" (e.g., crampy, dull, intermittent, constant)      Cramping- before diarrhea 6. ABDOMINAL PAIN SEVERITY: If present, ask: "How bad is the pain?"  (e.g., Scale 1-10; mild, moderate, or severe)   - MILD (1-3): doesn't interfere with normal activities, abdomen soft and not tender to touch    - MODERATE (4-7): interferes with normal activities or awakens from sleep, tender to touch    - SEVERE (8-10): excruciating pain, doubled over, unable to do any normal activities       mild 7. ORAL INTAKE: If vomiting, "Have you been able to drink liquids?" "How much fluids have you had in the past 24 hours?"     Not vomiting 8. HYDRATION: "Any signs of dehydration?" (e.g., dry mouth [not just dry lips], too weak to stand,  dizziness, new weight loss) "When did you last urinate?"     Trying to drink clear liquids- 1 hour 9. EXPOSURE: "Have you traveled to a foreign country recently?" "Have you been exposed to anyone with diarrhea?" "Could you have eaten any food that was spoiled?"     no 10. ANTIBIOTIC USE: "Are you taking antibiotics now or have you taken antibiotics in the past 2 months?"       no 11. OTHER SYMPTOMS: "Do you have any other symptoms?" (e.g., fever, blood in stool)       Slight elevation in BP, headache 12. PREGNANCY: "Is there any chance you are pregnant?" "When was your last menstrual period?"       n/a  Protocols used: DIARRHEA-A-AH

## 2020-04-20 ENCOUNTER — Encounter: Payer: Self-pay | Admitting: Family Medicine

## 2020-04-20 ENCOUNTER — Telehealth (INDEPENDENT_AMBULATORY_CARE_PROVIDER_SITE_OTHER): Payer: Medicare Other | Admitting: Family Medicine

## 2020-04-20 ENCOUNTER — Other Ambulatory Visit: Payer: Self-pay

## 2020-04-20 DIAGNOSIS — B349 Viral infection, unspecified: Secondary | ICD-10-CM | POA: Insufficient documentation

## 2020-04-20 DIAGNOSIS — R059 Cough, unspecified: Secondary | ICD-10-CM | POA: Diagnosis not present

## 2020-04-20 DIAGNOSIS — G4709 Other insomnia: Secondary | ICD-10-CM

## 2020-04-20 MED ORDER — PROMETHAZINE-DM 6.25-15 MG/5ML PO SYRP: 5.0000 mL | ORAL_SOLUTION | Freq: Four times a day (QID) | ORAL | 0 refills | Status: DC | PRN

## 2020-04-20 MED ORDER — QUETIAPINE FUMARATE 200 MG PO TABS: 200.0000 mg | ORAL_TABLET | Freq: Every day | ORAL | 1 refills | Status: DC

## 2020-04-20 NOTE — Assessment & Plan Note (Signed)
See cough A/P 

## 2020-04-20 NOTE — Assessment & Plan Note (Signed)
Needing refill on seroquel.  Rx sent to pharmacy on file.

## 2020-04-20 NOTE — Progress Notes (Signed)
Virtual Visit via Telephone  The purpose of this virtual visit is to provide medical care while limiting exposure to the novel coronavirus (COVID19) for both patient and office staff.  Consent was obtained for phone visit:  Yes.   Answered questions that patient had about telehealth interaction:  Yes.   I discussed the limitations, risks, security and privacy concerns of performing an evaluation and management service by telephone. I also discussed with the patient that there may be a patient responsible charge related to this service. The patient expressed understanding and agreed to proceed.  Patient is at home and is accessed via telephone Services are provided by Harlin Rain, FNP-C from [facility])  ---------------------------------------------------------------------- Chief Complaint  Patient presents with  . Diarrhea    Diarrhea x 3 days w/ the last bm been lastnight at 7pm. She seems to think the diarrhea improving, headache, nauseated, mild sore throat x 3 days, hoarseness, coughing .  X 3 days     S: Reviewed CMA documentation. I have called patient and gathered additional HPI as follows:  Ms. Shew presents for virtual telemedicine visit via telephone for concerns of 3 days of diarrhea, headache, nausea, sore throat, hoarseness and coughing.  Reports last bowel movement last evening at 7pm and this symptom has been improving.  Has not taken anything OTC for her symptoms.  Has not had COVID testing.  Denies fevers, change in taste/smell, SOB, DOE, CP, abdominal pain, or vomiting.  No known sick contacts.  Patient is currently home Denies any high risk travel to areas of current concern for COVID19. Denies any known or suspected exposure to person with or possibly with COVID19.  Past Medical History:  Diagnosis Date  . Anxiety   . Arthritis   . Asthma   . Depression   . Frequent headaches   . GERD (gastroesophageal reflux disease)   . Migraine    . Psoriatic arthritis ([place])    Social History   Tobacco Use  . Smoking status: Never Smoker  . Smokeless tobacco: Never Used  Vaping Use  . Vaping Use: Never used  Substance Use Topics  . Alcohol use: No  . Drug use: No    Current Outpatient Medications:  .  albuterol (VENTOLIN HFA) 108 (90 Base) MCG/ACT inhaler, INHALE 1 TO 2 PUFFS INTO THE LUNGS EVERY 6 HOURS AS NEEDED FOR WHEEZING OR SHORTNESS OF BREATH, Disp: 6.7 g, Rfl: 1 .  ARIPiprazole (ABILIFY) 2 MG tablet, Take 1 tablet (2 mg total) by mouth daily., Disp: 90 tablet, Rfl: 1 .  fluticasone (FLONASE) 50 MCG/ACT nasal spray, Place 2 sprays into both nostrils daily. Use for 4-6 weeks then stop and use seasonally or as needed., Disp: 16 g, Rfl: 3 .  montelukast (SINGULAIR) 10 MG tablet, TAKE 1 TABLET BY MOUTH AT BEDTIME, Disp: 90 tablet, Rfl: 0 .  pantoprazole (PROTONIX) 40 MG tablet, TAKE 1 TABLET(40 MG) BY MOUTH TWICE DAILY, Disp: 180 tablet, Rfl: 0 .  PARoxetine (PAXIL) 40 MG tablet, TAKE 1 TABLET BY MOUTH AT BEDTIME, Disp: 90 tablet, Rfl: 0 .  traZODone (DESYREL) 50 MG tablet, Take 0.5-1 tablets (25-50 mg total) by mouth at bedtime as needed for sleep., Disp: 30 tablet, Rfl: 3 .  verapamil (CALAN) 120 MG tablet, Take 1 tablet (120 mg total) by mouth daily., Disp: 90 tablet, Rfl: 1 .  budesonide-formoterol (SYMBICORT) 160-4.5 MCG/ACT inhaler, Inhale 2 puffs into the lungs 2 (two) times daily. (Patient not taking: Reported on 04/20/2020),  Disp: 1 each, Rfl: 3 .  levocetirizine (XYZAL) 5 MG tablet, Take by mouth. (Patient not taking: Reported on 04/20/2020), Disp: , Rfl:  .  Naltrexone-buPROPion HCl ER 8-90 MG TB12, Start 1 tablet every morning for 7 days, then 1 tablet twice daily for 7 days, then 2 tablets every morning and one in the evening, then 2 tablets twice daily (Patient not taking: Reported on 04/20/2020), Disp: 120 tablet, Rfl: 0 .  promethazine-dextromethorphan (PROMETHAZINE-DM) 6.25-15 MG/5ML syrup, Take 5 mLs by mouth  4 (four) times daily as needed for cough., Disp: 118 mL, Rfl: 0 .  QUEtiapine (SEROQUEL) 200 MG tablet, Take 1 tablet (200 mg total) by mouth at bedtime., Disp: 90 tablet, Rfl: 1 .  WIXELA INHUB 250-50 MCG/DOSE AEPB, , Disp: , Rfl:   Depression screen [facility] 2/9 03/13/2020 09/15/2019 03/25/2019  Decreased Interest 0 0 0  Down, Depressed, Hopeless 0 0 0  PHQ - 2 Score 0 0 0  Altered sleeping 1 0 -  Tired, decreased energy 0 0 -  Change in appetite 0 0 -  Feeling bad or failure about yourself  0 0 -  Trouble concentrating 0 0 -  Moving slowly or fidgety/restless 0 0 -  Suicidal thoughts 0 0 -  PHQ-9 Score 1 0 -  Difficult doing work/chores Not difficult at all - -  Some recent data might be hidden    GAD 7 : Generalized Anxiety Score 03/13/2020 11/16/2018 05/12/2018 10/01/2015  Nervous, Anxious, on Edge 0 2 3 3   Control/stop worrying 0 1 3 2   Worry too much - different things 0 1 1 2   Trouble relaxing 0 1 3 2   Restless 0 0 0 0  Easily annoyed or irritable 1 2 3 3   Afraid - awful might happen 0 1 0 1  Total GAD 7 Score 1 8 13 13   Anxiety Difficulty Not difficult at all - Not difficult at all Somewhat difficult    -------------------------------------------------------------------------- O: No physical exam performed due to remote telephone encounter.  Physical Exam: Patient remotely monitored without video.  Verbal communication appropriate.  Cognition normal.  No results found for this or any previous visit (from the past 2160 hour(s)).  -------------------------------------------------------------------------- A&P:  Problem List Items Addressed This Visit      Other   Other insomnia    Needing refill on seroquel.  Rx sent to pharmacy on file.      Relevant Medications   QUEtiapine (SEROQUEL) 200 MG tablet   Cough - Primary    Likely viral infection, encouraged COVID testing.  Will refill promethazine DM to take 50mL every 6 hours as needed for cough with nausea.  To work  towards increasing oral fluid intake as well as following a [employer].  Strict ER precautions.  Plan: 1. Begin promethazine DM 35mL every 6 hours as needed for cough and nausea, not to drive or operate heavy machinery while on this medication, as it can cause sedation 2. To have COVID testing completed, if positive, to let us know, as can send for antibody infusion 3. Increase fluids, rest 4. Strict ER precautions 5. RTC PRN      Relevant Medications   promethazine-dextromethorphan (PROMETHAZINE-DM) 6.25-15 MG/5ML syrup   Viral infection    See cough AP         Meds ordered this encounter  Medications  . promethazine-dextromethorphan (PROMETHAZINE-DM) 6.25-15 MG/5ML syrup    Sig: Take 5 mLs by mouth 4 (four) times daily as needed for cough.  Dispense:  118 mL    Refill:  0  . QUEtiapine (SEROQUEL) 200 MG tablet    Sig: Take 1 tablet (200 mg total) by mouth at bedtime.    Dispense:  90 tablet    Refill:  1    Follow-up: - Return if symptoms worsen or fail to improve  Patient verbalizes understanding with the above medical recommendations including the limitation of remote medical advice.  Specific follow-up and call-back criteria were given for patient to follow-up or seek medical care more urgently if needed.  - Time spent in direct consultation with patient on phone: 6 minutes  Harlin Rain, [facility] 04/20/2020, 9:15 AM

## 2020-04-20 NOTE — Telephone Encounter (Signed)
Virtual OV 04/20/20

## 2020-04-20 NOTE — Assessment & Plan Note (Signed)
Likely viral infection, encouraged COVID testing.  Will refill promethazine DM to take 53mL every 6 hours as needed for cough with nausea.  To work towards increasing oral fluid intake as well as following a [employer].  Strict ER precautions.  Plan: 1. Begin promethazine DM 61mL every 6 hours as needed for cough and nausea, not to drive or operate heavy machinery while on this medication, as it can cause sedation 2. To have COVID testing completed, if positive, to let us know, as can send for antibody infusion 3. Increase fluids, rest 4. Strict ER precautions 5. RTC PRN

## 2020-04-21 DIAGNOSIS — H7201 Central perforation of tympanic membrane, right ear: Secondary | ICD-10-CM | POA: Diagnosis not present

## 2020-04-21 DIAGNOSIS — J019 Acute sinusitis, unspecified: Secondary | ICD-10-CM | POA: Diagnosis not present

## 2020-04-21 DIAGNOSIS — H6530 Chronic mucoid otitis media, unspecified ear: Secondary | ICD-10-CM | POA: Diagnosis not present

## 2020-05-01 DIAGNOSIS — Z96642 Presence of left artificial hip joint: Secondary | ICD-10-CM | POA: Diagnosis not present

## 2020-05-01 DIAGNOSIS — M47818 Spondylosis without myelopathy or radiculopathy, sacral and sacrococcygeal region: Secondary | ICD-10-CM | POA: Diagnosis not present

## 2020-05-01 DIAGNOSIS — M1610 Unilateral primary osteoarthritis, unspecified hip: Secondary | ICD-10-CM | POA: Diagnosis not present

## 2020-05-01 DIAGNOSIS — M545 Low back pain, unspecified: Secondary | ICD-10-CM | POA: Diagnosis not present

## 2020-05-01 DIAGNOSIS — G8929 Other chronic pain: Secondary | ICD-10-CM | POA: Diagnosis not present

## 2020-05-11 DIAGNOSIS — H7201 Central perforation of tympanic membrane, right ear: Secondary | ICD-10-CM | POA: Diagnosis not present

## 2020-05-12 ENCOUNTER — Other Ambulatory Visit: Payer: Self-pay | Admitting: Family Medicine

## 2020-05-12 DIAGNOSIS — J4520 Mild intermittent asthma, uncomplicated: Secondary | ICD-10-CM

## 2020-06-06 ENCOUNTER — Encounter: Payer: Self-pay | Admitting: Unknown Physician Specialty

## 2020-06-06 ENCOUNTER — Other Ambulatory Visit: Payer: Self-pay

## 2020-06-06 ENCOUNTER — Telehealth (INDEPENDENT_AMBULATORY_CARE_PROVIDER_SITE_OTHER): Payer: Medicare Other | Admitting: Unknown Physician Specialty

## 2020-06-06 DIAGNOSIS — J069 Acute upper respiratory infection, unspecified: Secondary | ICD-10-CM

## 2020-06-06 NOTE — Progress Notes (Signed)
   There were no vitals taken for this visit.   Subjective:    Patient ID: Michele Newton, female    DOB: Jul 24, 1955, 65 y.o.   MRN: [id number]  HPI: Michele Newton is a 65 y.o. female  Chief Complaint  Patient presents with  . Sore Throat    Laryngitis, runny nose, fever 99.6 Temp on yesterday, coughing, fatigue  and sore throat x 3 days  negative home COVID test on yesterday. Pt does admits that she was exposed to [place] by her grandchildren x 2 weeks ago.    This visit was completed via telephone due to the restrictions of the COVID-19 pandemic. All issues as above were discussed and addressed but no physical exam was performed. If it was felt that the patient should be evaluated in the office, they were directed there. The patient verbally consented to this visit. Patient was unable to complete an audio/visual visit due to Technical difficulties,Lack of internet. . Location of the patient: home . Location of the provider: work . Those involved with this call:  . Provider: Kathrine Haddock, DNP . Time spent on call: 10 minutes on the phone discussing health concerns. 5 minutes total spent in review of patient's record and preparation of their chart.  I verified patient identity using two factors (patient name and date of birth). Patient consents verbally to being seen via telemedicine visit today.   Sore Throat  This is a new problem. Episode onset: 3 days. The problem has been unchanged. The pain is worse on the left side. Maximum temperature: 100.2 last night. Associated symptoms include congestion, coughing, ear pain, headaches, a hoarse voice and vomiting. Pertinent negatives include no shortness of breath. She has tried nothing for the symptoms.    Relevant past medical, surgical, family and social history reviewed and updated as indicated. Interim medical history since our last visit reviewed. Allergies and medications reviewed and updated.  Review of Systems  HENT: Positive for  congestion, ear pain and hoarse voice.   Respiratory: Positive for cough. Negative for shortness of breath.   Gastrointestinal: Positive for vomiting.  Neurological: Positive for headaches.    Per HPI unless specifically indicated above     Objective:    There were no vitals taken for this visit.  Wt Readings from Last 3 Encounters:  03/13/20 205 lb (93 kg)  02/29/20 205 lb 9.6 oz (93.3 kg)  11/25/19 201 lb 6.4 oz (91.4 kg)    Physical Exam Neurological:     Mental Status: She is alert.     Results for orders placed or performed in visit on 08/09/19  POCT glycosylated hemoglobin (Hb A1C)  Result Value Ref Range   Hemoglobin A1C 5.5 4.0 - 5.6 %      Assessment & Plan:   Problem List Items Addressed This Visit   None   Visit Diagnoses    Viral upper respiratory tract infection    -  Primary   Suspect Covid. Supportive care.  PCR test.  Zinc, Vit C, Vit D,Quercetin, Melatonin recommended.        Almost out of window for anti-viral treatment and would need a positive test.    Follow up plan: Return if symptoms worsen or fail to improve.

## 2020-06-14 ENCOUNTER — Other Ambulatory Visit: Payer: Self-pay

## 2020-06-14 DIAGNOSIS — F5104 Psychophysiologic insomnia: Secondary | ICD-10-CM

## 2020-06-14 DIAGNOSIS — J4520 Mild intermittent asthma, uncomplicated: Secondary | ICD-10-CM

## 2020-06-14 MED ORDER — TRAZODONE HCL 50 MG PO TABS: 25.0000 mg | ORAL_TABLET | Freq: Every evening | ORAL | 3 refills | Status: DC | PRN

## 2020-06-14 MED ORDER — MONTELUKAST SODIUM 10 MG PO TABS: 10.0000 mg | ORAL_TABLET | Freq: Every day | ORAL | 1 refills | Status: DC

## 2020-06-20 ENCOUNTER — Other Ambulatory Visit: Payer: Self-pay

## 2020-06-20 ENCOUNTER — Encounter: Payer: Self-pay | Admitting: Unknown Physician Specialty

## 2020-06-20 ENCOUNTER — Ambulatory Visit (INDEPENDENT_AMBULATORY_CARE_PROVIDER_SITE_OTHER): Payer: Medicare Other | Admitting: Unknown Physician Specialty

## 2020-06-20 DIAGNOSIS — Z96642 Presence of left artificial hip joint: Secondary | ICD-10-CM | POA: Diagnosis not present

## 2020-06-20 DIAGNOSIS — K582 Mixed irritable bowel syndrome: Secondary | ICD-10-CM | POA: Diagnosis not present

## 2020-06-20 DIAGNOSIS — K589 Irritable bowel syndrome without diarrhea: Secondary | ICD-10-CM | POA: Insufficient documentation

## 2020-06-20 MED ORDER — LUBIPROSTONE 24 MCG PO CAPS: 24.0000 ug | ORAL_CAPSULE | Freq: Two times a day (BID) | ORAL | 2 refills | Status: DC

## 2020-06-20 NOTE — Assessment & Plan Note (Addendum)
Constipation is the worse symptom. Will start Amitiza to seeif it resolves problem.  May need to add metamucil.  Pt ed on fluids

## 2020-06-20 NOTE — Progress Notes (Signed)
BP 132/85 (BP Location: Left Arm, Patient Position: Sitting, Cuff Size: Normal)   Pulse 71   Temp (!) 97.1 F (36.2 C) (Temporal)   Resp 17   Ht 5\' 6"  (1.676 m)   Wt 207 lb 3.2 oz (94 kg)   SpO2 97%   BMI 33.44 kg/m    Subjective:    Patient ID: Michele Newton, female    DOB: Apr 17, 1956, 65 y.o.   MRN: [id number]  HPI: Michele Newton is a 65 y.o. female  Chief Complaint  Patient presents with  . Irritable Bowel Syndrome    Pt complains of constipation and diarrhea. Pt state was had been on medication in the past. She been trying to treat her symptoms with diet and OTC medications.    Disability Pt is here with paperwork related to being unable to work as a caregiver and resolve credit card debt.  States she had left hip replacement 11/2018  and due to have the right hip replaced.  Unable to stand for longer than 15 minutes and unable to walk without an assistive device.    IBS Pt is here for history of IBS.  States it got better but getting worse and has either constipation or diarrhea.  She has tried miralax.  Stomach hurts so much that she puts a heating bottle at not.  Constipation is the worse problem. Eats high fiber cereal with fruits and vegetables.  She is on a number of medications that may cause constipation but unable to stop those.   Relevant past medical, surgical, family and social history reviewed and updated as indicated. Interim medical history since our last visit reviewed. Allergies and medications reviewed and updated.  Review of Systems  Per HPI unless specifically indicated above     Objective:    BP 132/85 (BP Location: Left Arm, Patient Position: Sitting, Cuff Size: Normal)   Pulse 71   Temp (!) 97.1 F (36.2 C) (Temporal)   Resp 17   Ht 5\' 6"  (1.676 m)   Wt 207 lb 3.2 oz (94 kg)   SpO2 97%   BMI 33.44 kg/m   Wt Readings from Last 3 Encounters:  06/20/20 207 lb 3.2 oz (94 kg)  03/13/20 205 lb (93 kg)  02/29/20 205 lb 9.6 oz (93.3 kg)     Physical Exam Constitutional:      General: She is not in acute distress.    Appearance: Normal appearance. She is well-developed and well-nourished.  HENT:     Head: Normocephalic and atraumatic.  Eyes:     General: Lids are normal. No scleral icterus.       Right eye: No discharge.        Left eye: No discharge.     Conjunctiva/sclera: Conjunctivae normal.  Cardiovascular:     Rate and Rhythm: Normal rate.  Pulmonary:     Effort: Pulmonary effort is normal.  Abdominal:     Palpations: There is no hepatomegaly or splenomegaly.  Musculoskeletal:        General: Normal range of motion.  Skin:    General: Skin is intact.     Coloration: Skin is not pale.     Findings: No rash.  Neurological:     Mental Status: She is alert and oriented to person, place, and time.  Psychiatric:        Mood and Affect: Mood and affect normal.        Behavior: Behavior normal.        Thought  Content: Thought content normal.        Judgment: Judgment normal.     Results for orders placed or performed in visit on 08/09/19  POCT glycosylated hemoglobin (Hb A1C)  Result Value Ref Range   Hemoglobin A1C 5.5 4.0 - 5.6 %      Assessment & Plan:   Problem List Items Addressed This Visit      Unprioritized   IBS (irritable bowel syndrome)    Constipation is the worse symptom. Will start Amitiza to seeif it resolves problem.  May need to add metamucil.  Pt ed on fluids      Relevant Medications   lubiprostone (AMITIZA) 24 MCG capsule   Status post total hip replacement, left    With continuing pain.  Also needs right hip done.  Disability form filled out. May need Orthopedist signature          Follow up plan: Return in about 2 weeks (around 07/04/2020).

## 2020-06-20 NOTE — Assessment & Plan Note (Signed)
With continuing pain.  Also needs right hip done.  Disability form filled out. May need Orthopedist signature

## 2020-06-21 DIAGNOSIS — M1610 Unilateral primary osteoarthritis, unspecified hip: Secondary | ICD-10-CM | POA: Diagnosis not present

## 2020-06-21 DIAGNOSIS — M1611 Unilateral primary osteoarthritis, right hip: Secondary | ICD-10-CM | POA: Diagnosis not present

## 2020-06-30 ENCOUNTER — Other Ambulatory Visit: Payer: Self-pay | Admitting: Orthopedic Surgery

## 2020-07-17 ENCOUNTER — Other Ambulatory Visit: Payer: Self-pay

## 2020-07-17 DIAGNOSIS — F32A Depression, unspecified: Secondary | ICD-10-CM

## 2020-07-17 MED ORDER — PAROXETINE HCL 40 MG PO TABS: 40.0000 mg | ORAL_TABLET | Freq: Every day | ORAL | 0 refills | Status: DC

## 2020-07-18 ENCOUNTER — Other Ambulatory Visit: Payer: Self-pay

## 2020-07-18 ENCOUNTER — Other Ambulatory Visit
Admission: RE | Admit: 2020-07-18 | Discharge: 2020-07-18 | Disposition: A | Discharge status: Home / Self Care | Payer: Medicare Other | Source: Ambulatory Visit | Attending: Orthopedic Surgery | Admitting: Orthopedic Surgery

## 2020-07-18 DIAGNOSIS — Z01818 Encounter for other preprocedural examination: Secondary | ICD-10-CM | POA: Diagnosis not present

## 2020-07-18 DIAGNOSIS — Z01810 Encounter for preprocedural cardiovascular examination: Secondary | ICD-10-CM | POA: Diagnosis not present

## 2020-07-18 LAB — CBC WITH DIFFERENTIAL/PLATELET
Abs Immature Granulocytes: 0.03 10*3/uL (ref 0.00–0.07)
Basophils Absolute: 0.1 10*3/uL (ref 0.0–0.1)
Basophils Relative: 1 %
Eosinophils Absolute: 0.1 10*3/uL (ref 0.0–0.5)
Eosinophils Relative: 2 %
HCT: 43.8 % (ref 36.0–46.0)
Hemoglobin: 14.8 g/dL (ref 12.0–15.0)
Immature Granulocytes: 0 %
Lymphocytes Relative: 29 %
Lymphs Abs: 2.2 10*3/uL (ref 0.7–4.0)
MCH: 28.7 pg (ref 26.0–34.0)
MCHC: 33.8 g/dL (ref 30.0–36.0)
MCV: 85 fL (ref 80.0–100.0)
Monocytes Absolute: 0.8 10*3/uL (ref 0.1–1.0)
Monocytes Relative: 10 %
Neutro Abs: 4.6 10*3/uL (ref 1.7–7.7)
Neutrophils Relative %: 58 %
Platelets: 420 10*3/uL — ABNORMAL HIGH (ref 150–400)
RBC: 5.15 MIL/uL — ABNORMAL HIGH (ref 3.87–5.11)
RDW: 13.2 % (ref 11.5–15.5)
WBC: 7.9 10*3/uL (ref 4.0–10.5)
nRBC: 0 % (ref 0.0–0.2)

## 2020-07-18 LAB — COMPREHENSIVE METABOLIC PANEL
ALT: 32 U/L (ref 0–44)
AST: 24 U/L (ref 15–41)
Albumin: 4.1 g/dL (ref 3.5–5.0)
Alkaline Phosphatase: 81 U/L (ref 38–126)
Anion gap: 8 (ref 5–15)
BUN: 17 mg/dL (ref 8–23)
CO2: 23 mmol/L (ref 22–32)
Calcium: 9.2 mg/dL (ref 8.9–10.3)
Chloride: 107 mmol/L (ref 98–111)
Creatinine, Ser: 0.88 mg/dL (ref 0.44–1.00)
GFR, Estimated: >60 mL/min (ref >60)
Glucose, Bld: 95 mg/dL (ref 70–99)
Potassium: 3.9 mmol/L (ref 3.5–5.1)
Sodium: 138 mmol/L (ref 135–145)
Total Bilirubin: 0.6 mg/dL (ref 0.3–1.2)
Total Protein: 7.3 g/dL (ref 6.5–8.1)

## 2020-07-18 LAB — TYPE AND SCREEN
ABO/RH(D): O POS
Antibody Screen: NEGATIVE

## 2020-07-18 LAB — SURGICAL PCR SCREEN
MRSA, PCR: NEGATIVE
Staphylococcus aureus: NEGATIVE

## 2020-07-18 NOTE — Patient Instructions (Addendum)
Your procedure is scheduled on: Thursday July 27, 2020. Report to Day Surgery inside [place] 2nd floor (stop by Admissions desk first before getting on Elevator). To find out your arrival time please call [phone] between 1PM - 3PM on Wednesday July 26, 2020.  Remember: Instructions that are not followed completely may result in serious medical risk,  up to and including death, or upon the discretion of your surgeon and anesthesiologist your  surgery may need to be rescheduled.     _X__ 1. Do not eat food after midnight the night before your procedure.                 No chewing gum or hard candies. You may drink clear liquids up to 2 hours                 before you are scheduled to arrive for your surgery- DO not drink clear                 liquids within 2 hours of the start of your surgery.                 Clear Liquids include:  water, apple juice without pulp, clear Gatorade, G2 or                  Gatorade Zero (avoid Red/Purple/Blue), Black Coffee or Tea (Do not add                 anything to coffee or tea).  __X__2.   Complete the "Ensure Clear Pre-surgery Clear Carbohydrate Drink" provided to you, 2 hours before arrival. **If you are diabetic you will be provided with an alternative drink, Gatorade Zero or G2.  __X__3.  On the morning of surgery brush your teeth with toothpaste and water, you                may rinse your mouth with mouthwash if you wish.  Do not swallow any toothpaste of mouthwash.     _X__ 4.  No Alcohol for 24 hours before or after surgery.   _X__ 5.  Do Not Smoke or use e-cigarettes For 24 Hours Prior to Your Surgery.                 Do not use any chewable tobacco products for at least 6 hours prior to                 Surgery.  _X__  6.  Do not use any recreational drugs (marijuana, cocaine, heroin, ecstasy, MDMA or other)                For at least one week prior to your surgery.  Combination of these drugs with  anesthesia                May have life threatening results.  __X__  7.  Notify your doctor if there is any change in your medical condition      (cold, fever, infections).     Do not wear jewelry, make-up, hairpins, clips or nail polish. Do not wear lotions, powders, or perfumes. You may wear deodorant. Do not shave 48 hours prior to surgery. Men may shave face and neck. Do not bring valuables to the hospital.    [facility] is not responsible for any belongings or valuables.  Contacts, dentures or bridgework may not be worn into surgery. Leave your suitcase in the car. After  surgery it may be brought to your room. For patients admitted to the hospital, discharge time is determined by your treatment team.   Patients discharged the day of surgery will not be allowed to drive home.   Make arrangements for someone to be with you for the first 24 hours of your Same Day Discharge.    Please read over the following fact sheets that you were given:   Total Joint Packet    __X__ Take these medicines the morning of surgery with A SIP OF WATER:     1. pantoprazole (PROTONIX) 40 MG   2. verapamil (CALAN) 120 MG     ____ Fleet Enema (as directed)   __X__ Use CHG Soap (or wipes) as directed  ____ Use Benzoyl Peroxide Gel as instructed  __X__ Use inhalers on the day of surgery  albuterol (VENTOLIN HFA) 108 (90 Base) MCG/ACT inhaler  budesonide-formoterol (SYMBICORT) 160-4.5 MCG/ACT inhaler  ____ Stop metformin 2 days prior to surgery    ____ Take 1/2 of usual insulin dose the night before surgery. No insulin the morning          of surgery.   ____ Contact your PCP, cardiologist or pulmonologist when to stop any blood thinners, and or aspirin before your procedure.   __X__ Stop Anti-inflammatories such as Ibuprofen, Aleve, Advil, naproxen, aspirin and or BC powders.    __X__ Stop supplements until after surgery.    __X__ Do not start any herbal supplements before your  procedure.     If you have any questions regarding your pre-procedure instructions,  Please call Pre-admit Testing at [phone].

## 2020-07-25 ENCOUNTER — Other Ambulatory Visit
Admission: RE | Admit: 2020-07-25 | Discharge: 2020-07-25 | Disposition: A | Discharge status: Home / Self Care | Payer: Medicare Other | Source: Ambulatory Visit | Attending: Orthopedic Surgery | Admitting: Orthopedic Surgery

## 2020-07-25 ENCOUNTER — Other Ambulatory Visit: Payer: Self-pay

## 2020-07-25 DIAGNOSIS — Z01812 Encounter for preprocedural laboratory examination: Secondary | ICD-10-CM | POA: Insufficient documentation

## 2020-07-25 DIAGNOSIS — F32A Depression, unspecified: Secondary | ICD-10-CM | POA: Diagnosis present

## 2020-07-25 DIAGNOSIS — K219 Gastro-esophageal reflux disease without esophagitis: Secondary | ICD-10-CM | POA: Diagnosis present

## 2020-07-25 DIAGNOSIS — I10 Essential (primary) hypertension: Secondary | ICD-10-CM | POA: Diagnosis not present

## 2020-07-25 DIAGNOSIS — M1990 Unspecified osteoarthritis, unspecified site: Secondary | ICD-10-CM | POA: Diagnosis not present

## 2020-07-25 DIAGNOSIS — Z8249 Family history of ischemic heart disease and other diseases of the circulatory system: Secondary | ICD-10-CM | POA: Diagnosis not present

## 2020-07-25 DIAGNOSIS — Z20822 Contact with and (suspected) exposure to covid-19: Secondary | ICD-10-CM | POA: Diagnosis present

## 2020-07-25 DIAGNOSIS — F419 Anxiety disorder, unspecified: Secondary | ICD-10-CM | POA: Diagnosis present

## 2020-07-25 DIAGNOSIS — Z79899 Other long term (current) drug therapy: Secondary | ICD-10-CM | POA: Diagnosis not present

## 2020-07-25 DIAGNOSIS — Z823 Family history of stroke: Secondary | ICD-10-CM | POA: Diagnosis not present

## 2020-07-25 DIAGNOSIS — Z96642 Presence of left artificial hip joint: Secondary | ICD-10-CM | POA: Diagnosis not present

## 2020-07-25 DIAGNOSIS — Z885 Allergy status to narcotic agent status: Secondary | ICD-10-CM | POA: Diagnosis not present

## 2020-07-25 DIAGNOSIS — Z96641 Presence of right artificial hip joint: Secondary | ICD-10-CM | POA: Diagnosis not present

## 2020-07-25 DIAGNOSIS — J45909 Unspecified asthma, uncomplicated: Secondary | ICD-10-CM | POA: Diagnosis present

## 2020-07-25 DIAGNOSIS — Z888 Allergy status to other drugs, medicaments and biological substances status: Secondary | ICD-10-CM | POA: Diagnosis not present

## 2020-07-25 DIAGNOSIS — M1611 Unilateral primary osteoarthritis, right hip: Secondary | ICD-10-CM | POA: Diagnosis present

## 2020-07-25 DIAGNOSIS — Z8261 Family history of arthritis: Secondary | ICD-10-CM | POA: Diagnosis not present

## 2020-07-25 DIAGNOSIS — Z471 Aftercare following joint replacement surgery: Secondary | ICD-10-CM | POA: Diagnosis not present

## 2020-07-25 DIAGNOSIS — M25751 Osteophyte, right hip: Secondary | ICD-10-CM | POA: Diagnosis present

## 2020-07-25 DIAGNOSIS — M1610 Unilateral primary osteoarthritis, unspecified hip: Secondary | ICD-10-CM | POA: Diagnosis not present

## 2020-07-25 LAB — URINALYSIS, ROUTINE W REFLEX MICROSCOPIC
Bilirubin Urine: NEGATIVE
Glucose, UA: NEGATIVE mg/dL
Hgb urine dipstick: NEGATIVE
Ketones, ur: NEGATIVE mg/dL
Leukocytes,Ua: NEGATIVE
Nitrite: NEGATIVE
Protein, ur: NEGATIVE mg/dL
Specific Gravity, Urine: 1.018 (ref 1.005–1.030)
pH: 5 (ref 5.0–8.0)

## 2020-07-25 LAB — SARS CORONAVIRUS 2 (TAT 6-24 HRS): SARS Coronavirus 2: NEGATIVE

## 2020-07-27 ENCOUNTER — Inpatient Hospital Stay: Payer: Medicare Other

## 2020-07-27 ENCOUNTER — Encounter: Payer: Self-pay | Admitting: Orthopedic Surgery

## 2020-07-27 ENCOUNTER — Inpatient Hospital Stay: Payer: Medicare Other | Admitting: Anesthesiology

## 2020-07-27 ENCOUNTER — Encounter
Admission: RE | Disposition: A | Discharge status: Home / Self Care | Payer: Self-pay | Source: Home / Self Care | Attending: Orthopedic Surgery

## 2020-07-27 ENCOUNTER — Inpatient Hospital Stay
Admission: RE | Admit: 2020-07-27 | Discharge: 2020-07-30 | DRG: 470 | Disposition: A | Discharge status: Home / Self Care | Payer: Medicare Other | Attending: Orthopedic Surgery | Admitting: Orthopedic Surgery

## 2020-07-27 ENCOUNTER — Other Ambulatory Visit: Payer: Self-pay

## 2020-07-27 DIAGNOSIS — Z888 Allergy status to other drugs, medicaments and biological substances status: Secondary | ICD-10-CM | POA: Diagnosis not present

## 2020-07-27 DIAGNOSIS — J45909 Unspecified asthma, uncomplicated: Secondary | ICD-10-CM | POA: Diagnosis present

## 2020-07-27 DIAGNOSIS — Z96642 Presence of left artificial hip joint: Secondary | ICD-10-CM | POA: Clinically undetermined

## 2020-07-27 DIAGNOSIS — F419 Anxiety disorder, unspecified: Secondary | ICD-10-CM | POA: Diagnosis present

## 2020-07-27 DIAGNOSIS — K219 Gastro-esophageal reflux disease without esophagitis: Secondary | ICD-10-CM | POA: Diagnosis present

## 2020-07-27 DIAGNOSIS — Z885 Allergy status to narcotic agent status: Secondary | ICD-10-CM

## 2020-07-27 DIAGNOSIS — Z8249 Family history of ischemic heart disease and other diseases of the circulatory system: Secondary | ICD-10-CM

## 2020-07-27 DIAGNOSIS — M1611 Unilateral primary osteoarthritis, right hip: Principal | ICD-10-CM | POA: Diagnosis present

## 2020-07-27 DIAGNOSIS — Z79899 Other long term (current) drug therapy: Secondary | ICD-10-CM | POA: Diagnosis not present

## 2020-07-27 DIAGNOSIS — Z20822 Contact with and (suspected) exposure to covid-19: Secondary | ICD-10-CM | POA: Diagnosis present

## 2020-07-27 DIAGNOSIS — F32A Depression, unspecified: Secondary | ICD-10-CM | POA: Diagnosis present

## 2020-07-27 DIAGNOSIS — Z471 Aftercare following joint replacement surgery: Secondary | ICD-10-CM | POA: Diagnosis not present

## 2020-07-27 DIAGNOSIS — Z419 Encounter for procedure for purposes other than remedying health state, unspecified: Secondary | ICD-10-CM

## 2020-07-27 DIAGNOSIS — Z96641 Presence of right artificial hip joint: Secondary | ICD-10-CM | POA: Diagnosis not present

## 2020-07-27 DIAGNOSIS — Z823 Family history of stroke: Secondary | ICD-10-CM

## 2020-07-27 DIAGNOSIS — Z8261 Family history of arthritis: Secondary | ICD-10-CM | POA: Diagnosis not present

## 2020-07-27 DIAGNOSIS — Z96649 Presence of unspecified artificial hip joint: Secondary | ICD-10-CM

## 2020-07-27 DIAGNOSIS — G8918 Other acute postprocedural pain: Secondary | ICD-10-CM

## 2020-07-27 DIAGNOSIS — M25751 Osteophyte, right hip: Secondary | ICD-10-CM | POA: Diagnosis present

## 2020-07-27 HISTORY — PX: TOTAL HIP ARTHROPLASTY: SHX124

## 2020-07-27 LAB — CREATININE, SERUM
Creatinine, Ser: 1.04 mg/dL — ABNORMAL HIGH (ref 0.44–1.00)
GFR, Estimated: 60 mL/min — ABNORMAL LOW (ref >60)

## 2020-07-27 LAB — CBC
HCT: 41.1 % (ref 36.0–46.0)
Hemoglobin: 13.5 g/dL (ref 12.0–15.0)
MCH: 28.8 pg (ref 26.0–34.0)
MCHC: 32.8 g/dL (ref 30.0–36.0)
MCV: 87.6 fL (ref 80.0–100.0)
Platelets: 373 10*3/uL (ref 150–400)
RBC: 4.69 MIL/uL (ref 3.87–5.11)
RDW: 13.5 % (ref 11.5–15.5)
WBC: 15.2 10*3/uL — ABNORMAL HIGH (ref 4.0–10.5)
nRBC: 0 % (ref 0.0–0.2)

## 2020-07-27 SURGERY — ARTHROPLASTY, HIP, TOTAL, ANTERIOR APPROACH
Anesthesia: Spinal | Site: Hip | Laterality: Right

## 2020-07-27 MED ORDER — DIPHENHYDRAMINE HCL 12.5 MG/5ML PO ELIX: 12.5000 mg | ORAL_SOLUTION | ORAL | Status: DC | PRN

## 2020-07-27 MED ORDER — MIDAZOLAM HCL 2 MG/2ML IJ SOLN
INTRAMUSCULAR | Status: AC
  Filled 2020-07-27: qty 2

## 2020-07-27 MED ORDER — OXYCODONE HCL 5 MG/5ML PO SOLN: 5.0000 mg | Freq: Once | ORAL | Status: DC | PRN

## 2020-07-27 MED ORDER — ALBUTEROL SULFATE HFA 108 (90 BASE) MCG/ACT IN AERS
1.0000 | INHALATION_SPRAY | Freq: Four times a day (QID) | RESPIRATORY_TRACT | Status: DC | PRN
  Filled 2020-07-27: qty 6.7

## 2020-07-27 MED ORDER — TRAMADOL HCL 50 MG PO TABS
50.0000 mg | ORAL_TABLET | Freq: Four times a day (QID) | ORAL | Status: DC
Start: 2020-07-27 — End: 2020-07-30
  Administered 2020-07-27 – 2020-07-30 (×10): 50 mg via ORAL
  Filled 2020-07-27 (×11): qty 1

## 2020-07-27 MED ORDER — EPHEDRINE 5 MG/ML INJ
INTRAVENOUS | Status: AC
  Filled 2020-07-27: qty 10

## 2020-07-27 MED ORDER — CEFAZOLIN SODIUM-DEXTROSE 2-4 GM/100ML-% IV SOLN
2.0000 g | INTRAVENOUS | Status: AC
  Administered 2020-07-27: 2 g via INTRAVENOUS

## 2020-07-27 MED ORDER — CEFAZOLIN SODIUM-DEXTROSE 2-4 GM/100ML-% IV SOLN
2.0000 g | Freq: Four times a day (QID) | INTRAVENOUS | Status: AC
  Administered 2020-07-27 – 2020-07-28 (×2): 2 g via INTRAVENOUS
  Filled 2020-07-27 (×2): qty 100

## 2020-07-27 MED ORDER — METHOCARBAMOL 500 MG PO TABS
500.0000 mg | ORAL_TABLET | Freq: Four times a day (QID) | ORAL | Status: DC | PRN
  Administered 2020-07-27 – 2020-07-30 (×5): 500 mg via ORAL
  Filled 2020-07-27 (×5): qty 1

## 2020-07-27 MED ORDER — CHLORHEXIDINE GLUCONATE 0.12 % MT SOLN: 15.0000 mL | Freq: Once | OROMUCOSAL | Status: AC

## 2020-07-27 MED ORDER — QUETIAPINE FUMARATE 200 MG PO TABS
200.0000 mg | ORAL_TABLET | Freq: Every day | ORAL | Status: DC
  Administered 2020-07-27 – 2020-07-29 (×3): 200 mg via ORAL
  Filled 2020-07-27 (×5): qty 1

## 2020-07-27 MED ORDER — EPHEDRINE SULFATE 50 MG/ML IJ SOLN
INTRAMUSCULAR | Status: DC | PRN
  Administered 2020-07-27: 5 mg via INTRAVENOUS
  Administered 2020-07-27 (×2): 10 mg via INTRAVENOUS
  Administered 2020-07-27: 5 mg via INTRAVENOUS

## 2020-07-27 MED ORDER — PHENYLEPHRINE HCL (PRESSORS) 10 MG/ML IV SOLN
INTRAVENOUS | Status: AC
  Filled 2020-07-27: qty 3

## 2020-07-27 MED ORDER — DEXMEDETOMIDINE (PRECEDEX) IN NS 20 MCG/5ML (4 MCG/ML) IV SYRINGE
PREFILLED_SYRINGE | INTRAVENOUS | Status: DC | PRN
  Administered 2020-07-27 (×3): 4 ug via INTRAVENOUS

## 2020-07-27 MED ORDER — HYDROCODONE-ACETAMINOPHEN 7.5-325 MG PO TABS
1.0000 | ORAL_TABLET | ORAL | Status: DC | PRN
  Administered 2020-07-27 (×2): 1 via ORAL
  Administered 2020-07-27: 2 via ORAL
  Administered 2020-07-28: 1 via ORAL
  Filled 2020-07-27: qty 2
  Filled 2020-07-27 (×3): qty 1

## 2020-07-27 MED ORDER — FENTANYL CITRATE (PF) 100 MCG/2ML IJ SOLN
INTRAMUSCULAR | Status: AC
  Administered 2020-07-27: 25 ug via INTRAVENOUS
  Filled 2020-07-27: qty 2

## 2020-07-27 MED ORDER — PROPOFOL 500 MG/50ML IV EMUL
INTRAVENOUS | Status: AC
  Filled 2020-07-27: qty 50

## 2020-07-27 MED ORDER — MORPHINE SULFATE (PF) 2 MG/ML IV SOLN
0.5000 mg | INTRAVENOUS | Status: DC | PRN
  Administered 2020-07-27: 1 mg via INTRAVENOUS
  Filled 2020-07-27: qty 1

## 2020-07-27 MED ORDER — TRAZODONE HCL 50 MG PO TABS
50.0000 mg | ORAL_TABLET | Freq: Every day | ORAL | Status: DC
  Administered 2020-07-27 – 2020-07-29 (×3): 50 mg via ORAL
  Filled 2020-07-27 (×3): qty 1

## 2020-07-27 MED ORDER — ONDANSETRON HCL 4 MG/2ML IJ SOLN
INTRAMUSCULAR | Status: DC | PRN
  Administered 2020-07-27: 4 mg via INTRAVENOUS

## 2020-07-27 MED ORDER — SODIUM CHLORIDE 0.9 % IV SOLN: INTRAVENOUS | Status: DC

## 2020-07-27 MED ORDER — OXYCODONE HCL 5 MG PO TABS: 5.0000 mg | ORAL_TABLET | Freq: Once | ORAL | Status: DC | PRN

## 2020-07-27 MED ORDER — PANTOPRAZOLE SODIUM 40 MG PO TBEC
40.0000 mg | DELAYED_RELEASE_TABLET | Freq: Two times a day (BID) | ORAL | Status: DC
  Administered 2020-07-27 – 2020-07-30 (×6): 40 mg via ORAL
  Filled 2020-07-27 (×6): qty 1

## 2020-07-27 MED ORDER — FLUTICASONE PROPIONATE 50 MCG/ACT NA SUSP
2.0000 | Freq: Every day | NASAL | Status: DC
  Administered 2020-07-28 – 2020-07-30 (×3): 2 via NASAL
  Filled 2020-07-27: qty 16

## 2020-07-27 MED ORDER — PHENYLEPHRINE HCL (PRESSORS) 10 MG/ML IV SOLN
INTRAVENOUS | Status: DC | PRN
  Administered 2020-07-27: 100 ug via INTRAVENOUS
  Administered 2020-07-27: 50 ug via INTRAVENOUS
  Administered 2020-07-27: 100 ug via INTRAVENOUS

## 2020-07-27 MED ORDER — MAGNESIUM HYDROXIDE 400 MG/5ML PO SUSP
30.0000 mL | Freq: Every day | ORAL | Status: DC | PRN
Start: 2020-07-27 — End: 2020-07-30
  Administered 2020-07-30: 30 mL via ORAL
  Filled 2020-07-27: qty 30

## 2020-07-27 MED ORDER — DEXMEDETOMIDINE (PRECEDEX) IN NS 20 MCG/5ML (4 MCG/ML) IV SYRINGE
PREFILLED_SYRINGE | INTRAVENOUS | Status: AC
  Filled 2020-07-27: qty 5

## 2020-07-27 MED ORDER — PHENOL 1.4 % MT LIQD
1.0000 | OROMUCOSAL | Status: DC | PRN
  Filled 2020-07-27: qty 177

## 2020-07-27 MED ORDER — VERAPAMIL HCL 120 MG PO TABS
120.0000 mg | ORAL_TABLET | Freq: Every day | ORAL | Status: DC
  Administered 2020-07-28 – 2020-07-30 (×3): 120 mg via ORAL
  Filled 2020-07-27 (×3): qty 1

## 2020-07-27 MED ORDER — FLUTICASONE FUROATE-VILANTEROL 200-25 MCG/INH IN AEPB
1.0000 | INHALATION_SPRAY | Freq: Every day | RESPIRATORY_TRACT | Status: DC
  Administered 2020-07-29 – 2020-07-30 (×2): 1 via RESPIRATORY_TRACT
  Filled 2020-07-27 (×2): qty 28

## 2020-07-27 MED ORDER — ZOLPIDEM TARTRATE 5 MG PO TABS
5.0000 mg | ORAL_TABLET | Freq: Every evening | ORAL | Status: DC | PRN
  Filled 2020-07-27: qty 1

## 2020-07-27 MED ORDER — ONDANSETRON HCL 4 MG/2ML IJ SOLN: 4.0000 mg | Freq: Four times a day (QID) | INTRAMUSCULAR | Status: DC | PRN

## 2020-07-27 MED ORDER — DOCUSATE SODIUM 100 MG PO CAPS
100.0000 mg | ORAL_CAPSULE | Freq: Two times a day (BID) | ORAL | Status: DC
  Administered 2020-07-27 – 2020-07-30 (×6): 100 mg via ORAL
  Filled 2020-07-27 (×6): qty 1

## 2020-07-27 MED ORDER — MENTHOL 3 MG MT LOZG
1.0000 | LOZENGE | OROMUCOSAL | Status: DC | PRN
  Filled 2020-07-27: qty 9

## 2020-07-27 MED ORDER — LIDOCAINE HCL (CARDIAC) PF 100 MG/5ML IV SOSY
PREFILLED_SYRINGE | INTRAVENOUS | Status: DC | PRN
  Administered 2020-07-27: 80 mg via INTRAVENOUS

## 2020-07-27 MED ORDER — LACTATED RINGERS IV SOLN: INTRAVENOUS | Status: DC

## 2020-07-27 MED ORDER — PROPOFOL 500 MG/50ML IV EMUL
INTRAVENOUS | Status: DC | PRN
  Administered 2020-07-27: 75 ug/kg/min via INTRAVENOUS

## 2020-07-27 MED ORDER — METOCLOPRAMIDE HCL 5 MG/ML IJ SOLN: 5.0000 mg | Freq: Three times a day (TID) | INTRAMUSCULAR | Status: DC | PRN

## 2020-07-27 MED ORDER — ENOXAPARIN SODIUM 40 MG/0.4ML ~~LOC~~ SOLN
40.0000 mg | SUBCUTANEOUS | Status: DC
  Administered 2020-07-28 – 2020-07-30 (×3): 40 mg via SUBCUTANEOUS
  Filled 2020-07-27 (×3): qty 0.4

## 2020-07-27 MED ORDER — MIDAZOLAM HCL 2 MG/2ML IJ SOLN
INTRAMUSCULAR | Status: DC | PRN
  Administered 2020-07-27: 2 mg via INTRAVENOUS

## 2020-07-27 MED ORDER — METOCLOPRAMIDE HCL 10 MG PO TABS: 5.0000 mg | ORAL_TABLET | Freq: Three times a day (TID) | ORAL | Status: DC | PRN

## 2020-07-27 MED ORDER — MONTELUKAST SODIUM 10 MG PO TABS
10.0000 mg | ORAL_TABLET | Freq: Every day | ORAL | Status: DC
  Administered 2020-07-27 – 2020-07-29 (×3): 10 mg via ORAL
  Filled 2020-07-27 (×3): qty 1

## 2020-07-27 MED ORDER — METHOCARBAMOL 1000 MG/10ML IJ SOLN
500.0000 mg | Freq: Four times a day (QID) | INTRAVENOUS | Status: DC | PRN
  Filled 2020-07-27 (×2): qty 5

## 2020-07-27 MED ORDER — FENTANYL CITRATE (PF) 100 MCG/2ML IJ SOLN
INTRAMUSCULAR | Status: DC | PRN
  Administered 2020-07-27 (×3): 25 ug via INTRAVENOUS

## 2020-07-27 MED ORDER — CHLORHEXIDINE GLUCONATE 0.12 % MT SOLN
OROMUCOSAL | Status: AC
  Administered 2020-07-27: 15 mL via OROMUCOSAL
  Filled 2020-07-27: qty 15

## 2020-07-27 MED ORDER — FENTANYL CITRATE (PF) 100 MCG/2ML IJ SOLN
INTRAMUSCULAR | Status: AC
  Filled 2020-07-27: qty 2

## 2020-07-27 MED ORDER — ACETAMINOPHEN 325 MG PO TABS
325.0000 mg | ORAL_TABLET | Freq: Four times a day (QID) | ORAL | Status: DC | PRN
  Administered 2020-07-28 – 2020-07-29 (×2): 650 mg via ORAL
  Filled 2020-07-27 (×2): qty 2

## 2020-07-27 MED ORDER — MAGNESIUM CITRATE PO SOLN
1.0000 | Freq: Once | ORAL | Status: AC | PRN
  Administered 2020-07-30: 1 via ORAL
  Filled 2020-07-27 (×2): qty 296

## 2020-07-27 MED ORDER — BISACODYL 10 MG RE SUPP
10.0000 mg | Freq: Every day | RECTAL | Status: DC | PRN
  Filled 2020-07-27: qty 1

## 2020-07-27 MED ORDER — PAROXETINE HCL 20 MG PO TABS
40.0000 mg | ORAL_TABLET | Freq: Every day | ORAL | Status: DC
  Administered 2020-07-27 – 2020-07-29 (×3): 40 mg via ORAL
  Filled 2020-07-27 (×3): qty 2

## 2020-07-27 MED ORDER — CEFAZOLIN SODIUM-DEXTROSE 2-4 GM/100ML-% IV SOLN
INTRAVENOUS | Status: AC
  Filled 2020-07-27: qty 100

## 2020-07-27 MED ORDER — ORAL CARE MOUTH RINSE: 15.0000 mL | Freq: Once | OROMUCOSAL | Status: AC

## 2020-07-27 MED ORDER — ALUM & MAG HYDROXIDE-SIMETH 200-200-20 MG/5ML PO SUSP: 30.0000 mL | ORAL | Status: DC | PRN

## 2020-07-27 MED ORDER — SODIUM CHLORIDE 0.9 % IV SOLN
INTRAVENOUS | Status: DC | PRN
  Administered 2020-07-27: 40 ug/min via INTRAVENOUS

## 2020-07-27 MED ORDER — PHENYLEPHRINE HCL (PRESSORS) 10 MG/ML IV SOLN
INTRAVENOUS | Status: AC
  Filled 2020-07-27: qty 1

## 2020-07-27 MED ORDER — LIDOCAINE HCL (PF) 2 % IJ SOLN
INTRAMUSCULAR | Status: AC
  Filled 2020-07-27: qty 5

## 2020-07-27 MED ORDER — HYDROCODONE-ACETAMINOPHEN 5-325 MG PO TABS: 1.0000 | ORAL_TABLET | ORAL | Status: DC | PRN

## 2020-07-27 MED ORDER — BUPIVACAINE HCL (PF) 0.5 % IJ SOLN
INTRAMUSCULAR | Status: AC
  Filled 2020-07-27: qty 10

## 2020-07-27 MED ORDER — ONDANSETRON HCL 4 MG PO TABS: 4.0000 mg | ORAL_TABLET | Freq: Four times a day (QID) | ORAL | Status: DC | PRN

## 2020-07-27 MED ORDER — FENTANYL CITRATE (PF) 100 MCG/2ML IJ SOLN
25.0000 ug | INTRAMUSCULAR | Status: DC | PRN
  Administered 2020-07-27 (×3): 25 ug via INTRAVENOUS

## 2020-07-27 SURGICAL SUPPLY — 62 items
APL PRP STRL LF DISP 70% ISPRP (MISCELLANEOUS) ×1
BLADE SAGITTAL AGGR TOOTH XLG (BLADE) ×2 IMPLANT
BNDG COHESIVE 6X5 TAN STRL LF (GAUZE/BANDAGES/DRESSINGS) ×6 IMPLANT
CANISTER SUCT 1200ML W/VALVE (MISCELLANEOUS) ×2 IMPLANT
CANISTER WOUND CARE 500ML ATS (WOUND CARE) ×2 IMPLANT
CHLORAPREP W/TINT 26 (MISCELLANEOUS) ×2 IMPLANT
COVER BACK TABLE REUSABLE LG (DRAPES) ×2 IMPLANT
COVER WAND RF STERILE (DRAPES) ×2 IMPLANT
DRAPE 3/4 80X56 (DRAPES) ×6 IMPLANT
DRAPE C-ARM XRAY 36X54 (DRAPES) ×2 IMPLANT
DRAPE INCISE IOBAN 66X60 STRL (DRAPES) IMPLANT
DRAPE POUCH INSTRU U-SHP 10X18 (DRAPES) ×2 IMPLANT
DRESSING SURGICEL FIBRLLR 1X2 (HEMOSTASIS) ×2 IMPLANT
DRSG MEPILEX SACRM 8.7X9.8 (GAUZE/BANDAGES/DRESSINGS) ×2 IMPLANT
DRSG OPSITE POSTOP 4X8 (GAUZE/BANDAGES/DRESSINGS) ×4 IMPLANT
DRSG SURGICEL FIBRILLAR 1X2 (HEMOSTASIS) ×4
ELECT BLADE 6.5 EXT (BLADE) ×2 IMPLANT
ELECT REM PT RETURN 9FT ADLT (ELECTROSURGICAL) ×2
ELECTRODE REM PT RTRN 9FT ADLT (ELECTROSURGICAL) ×1 IMPLANT
GLOVE SURG SYN 9.0  PF PI (GLOVE) ×2
GLOVE SURG SYN 9.0 PF PI (GLOVE) ×2 IMPLANT
GLOVE SURG UNDER POLY LF SZ9 (GLOVE) ×2 IMPLANT
GOWN SRG 2XL LVL 4 RGLN SLV (GOWNS) ×1 IMPLANT
GOWN STRL NON-REIN 2XL LVL4 (GOWNS) ×2
GOWN STRL REUS W/ TWL LRG LVL3 (GOWN DISPOSABLE) ×1 IMPLANT
GOWN STRL REUS W/TWL LRG LVL3 (GOWN DISPOSABLE) ×2
HEAD FEMORAL 28MM SZ S (Head) ×2 IMPLANT
HEMOVAC 400CC 10FR (MISCELLANEOUS) IMPLANT
HOLDER FOLEY CATH W/STRAP (MISCELLANEOUS) ×2 IMPLANT
HOOD PEEL AWAY FLYTE STAYCOOL (MISCELLANEOUS) ×2 IMPLANT
IRRIGATION SURGIPHOR STRL (IV SOLUTION) IMPLANT
KIT PREVENA INCISION MGT 13 (CANNISTER) ×2 IMPLANT
LINER DUAL MOB 50MM (Liner) ×2 IMPLANT
MANIFOLD NEPTUNE II (INSTRUMENTS) ×2 IMPLANT
MAT ABSORB  FLUID 56X50 GRAY (MISCELLANEOUS) ×1
MAT ABSORB FLUID 56X50 GRAY (MISCELLANEOUS) ×1 IMPLANT
NDL SAFETY ECLIPSE 18X1.5 (NEEDLE) ×1 IMPLANT
NEEDLE HYPO 18GX1.5 SHARP (NEEDLE) ×2
NEEDLE SPNL 20GX3.5 QUINCKE YW (NEEDLE) ×4 IMPLANT
NS IRRIG 1000ML POUR BTL (IV SOLUTION) ×2 IMPLANT
PACK HIP COMPR (MISCELLANEOUS) ×2 IMPLANT
SCALPEL PROTECTED #10 DISP (BLADE) ×4 IMPLANT
SHELL ACETABULAR SZ0 50 DME (Shell) ×2 IMPLANT
SOL PREP PVP 2OZ (MISCELLANEOUS) ×2
SOLUTION PREP PVP 2OZ (MISCELLANEOUS) ×1 IMPLANT
SPONGE DRAIN TRACH 4X4 STRL 2S (GAUZE/BANDAGES/DRESSINGS) ×2 IMPLANT
STAPLER SKIN PROX 35W (STAPLE) ×2 IMPLANT
STEM FEMORAL SZ2 STD COLLARED (Stem) ×2 IMPLANT
STRAP SAFETY 5IN WIDE (MISCELLANEOUS) ×2 IMPLANT
SUT DVC 2 QUILL PDO  T11 36X36 (SUTURE) ×1
SUT DVC 2 QUILL PDO T11 36X36 (SUTURE) ×1 IMPLANT
SUT SILK 0 (SUTURE) ×2
SUT SILK 0 30XBRD TIE 6 (SUTURE) ×1 IMPLANT
SUT V-LOC 90 ABS DVC 3-0 CL (SUTURE) ×2 IMPLANT
SUT VIC AB 1 CT1 36 (SUTURE) ×2 IMPLANT
SYR 20ML LL LF (SYRINGE) ×2 IMPLANT
SYR 30ML LL (SYRINGE) ×2 IMPLANT
SYR 50ML LL SCALE MARK (SYRINGE) ×4 IMPLANT
SYR BULB IRRIG 60ML STRL (SYRINGE) ×2 IMPLANT
TAPE MICROFOAM 4IN (TAPE) ×2 IMPLANT
TOWEL OR 17X26 4PK STRL BLUE (TOWEL DISPOSABLE) ×2 IMPLANT
TRAY FOLEY MTR SLVR 16FR STAT (SET/KITS/TRAYS/PACK) ×2 IMPLANT

## 2020-07-27 NOTE — Plan of Care (Signed)

## 2020-07-27 NOTE — Op Note (Signed)
07/27/2020  2:50 PM  PATIENT:  Michele Newton  65 y.o. female  PRE-OPERATIVE DIAGNOSIS:  Primary localized osteoarthritis of right hip M16.11 Arthritis of hip M16.10  POST-OPERATIVE DIAGNOSIS:  Primary localized osteoarthritis of right hip M16.11  PROCEDURE:  Procedure(s): TOTAL HIP ARTHROPLASTY ANTERIOR APPROACH (Right)  SURGEON: Laurene Footman, MD  ASSISTANTS: None  ANESTHESIA:   spinal  EBL:  Total I/O In: -  Out: 325 [Urine:300; Blood:25]  BLOOD ADMINISTERED:none  DRAINS: Incisional wound VAC   LOCAL MEDICATIONS USED:  MARCAINE    and OTHER Exparel  SPECIMEN:  Source of Specimen:  Right femoral head  DISPOSITION OF SPECIMEN:  PATHOLOGY  COUNTS:  YES  TOURNIQUET:  * No tourniquets in log *  IMPLANTS: Medacta AMIS 2 standard stem with 50 mm mPACT DM cup and liner with ceramic S 28 mm head  DICTATION: .Dragon Dictation   The patient was brought to the operating room and after spinal anesthesia was obtained patient was placed on the operative table with the ipsilateral foot into the Medacta attachment, contralateral leg on a well-padded table. C-arm was brought in and preop template x-ray taken. After prepping and draping in usual sterile fashion appropriate patient identification and timeout procedures were completed. Anterior approach to the hip was obtained and centered over the greater trochanter and TFL muscle. The subcutaneous tissue was incised hemostasis being achieved by electrocautery. TFL fascia was incised and the muscle retracted laterally deep retractor placed. The lateral femoral circumflex vessels were identified and ligated. The anterior capsule was exposed and a capsulotomy performed. The neck was identified and a femoral neck cut carried out with a saw. The head was removed without difficulty and showed sclerotic femoral head and acetabulum. Reaming was carried out to 50 mm and a 50 mm cup trial gave appropriate tightness to the acetabular component a 50 DM cup  was impacted into position. The leg was then externally rotated and ischiofemoral and pubofemoral releases carried out. The femur was sequentially broached to a size 2, size 2 standard with S head trials were placed and the final components chosen. The 2 standard stem was inserted along with a ceramic S 28 mm head and 50 mm liner. The hip was reduced and was stable the wound was thoroughly irrigated with fibrillar placed along the posterior capsule and medial neck. The deep fascia ws closed using a heavy Quill after infiltration of 30 cc of quarter percent Sensorcaine with epinephrine diluted with Exparel throughout the case, .3-0 V-loc to close the skin with skin staples.  Incisional wound VAC applied and patient was sent to recovery in stable condition.   PLAN OF CARE: Admit to inpatient

## 2020-07-27 NOTE — Anesthesia Procedure Notes (Cosign Needed)
Spinal  Patient location during procedure: OR Start time: 07/27/2020 12:48 PM End time: 07/27/2020 12:50 PM Reason for block: surgical anesthesia Staffing Performed: other anesthesia staff and anesthesiologist  Anesthesiologist: Piscitello, Precious Haws, MD Other anesthesia staff: Erick Colace, RN Preanesthetic Checklist Completed: patient identified, IV checked, site marked, risks and benefits discussed, surgical consent, monitors and equipment checked, pre-op evaluation and timeout performed Spinal Block Patient position: sitting Prep: DuraPrep Patient monitoring: heart rate, cardiac monitor, continuous pulse ox and blood pressure Approach: midline Location: L3-4 Injection technique: single-shot Needle Needle type: Pencan  Needle gauge: 24 G Needle length: 9 cm Assessment Sensory level: T10 Events: CSF return

## 2020-07-27 NOTE — Anesthesia Preprocedure Evaluation (Addendum)
Anesthesia Evaluation  Patient identified by MRN, date of birth, ID band Patient awake    Reviewed: Allergy & Precautions, H&P , NPO status , Patient's Chart, lab work & pertinent test results  History of Anesthesia Complications (+) PONV and history of anesthetic complications  Airway Mallampati: III  TM Distance: <3 FB Neck ROM: full    Dental  (+) Chipped   Pulmonary shortness of breath, asthma ,    Pulmonary exam normal        Cardiovascular hypertension, (-) angina(-) Past MI and (-) DOE Normal cardiovascular exam     Neuro/Psych  Headaches, PSYCHIATRIC DISORDERS    GI/Hepatic GERD  Medicated and Controlled,  Endo/Other    Renal/GU   negative genitourinary   Musculoskeletal  (+) Arthritis ,   Abdominal   Peds  Hematology negative hematology ROS (+)   Anesthesia Other Findings Past Medical History: No date: Anxiety No date: Arthritis No date: Asthma No date: Depression No date: Frequent headaches No date: GERD (gastroesophageal reflux disease) 1995: Hydrocephalus ([place]) No date: Migraine No date: Psoriatic arthritis ([place])  Past Surgical History: 2000: BREAST SURGERY     Comment:  biopsy 1998: NASAL SINUS SURGERY 1985: TONSILLECTOMY AND ADENOIDECTOMY 11/19/2018: TOTAL HIP ARTHROPLASTY; Left     Comment:  Procedure: TOTAL HIP ARTHROPLASTY ANTERIOR APPROACH;                Surgeon: Hessie Knows, MD;  Location: [facility] ORS;                Service: Orthopedics;  Laterality: Left; No date: TUBAL LIGATION 1998: TYMPANOSTOMY TUBE PLACEMENT 1995: VENTRICULAR ATRIAL SHUNT; Right     Reproductive/Obstetrics (+) Pregnancy                            Anesthesia Physical Anesthesia Plan  ASA: III  Anesthesia Plan: Spinal   Post-op Pain Management:    Induction:   PONV Risk Score and Plan:   Airway Management Planned: Natural Airway and Nasal Cannula  Additional Equipment:    Intra-op Plan:   Post-operative Plan:   Informed Consent: I have reviewed the patients History and Physical, chart, labs and discussed the procedure including the risks, benefits and alternatives for the proposed anesthesia with the patient or authorized representative who has indicated his/her understanding and acceptance.     Dental Advisory Given  Plan Discussed with: Anesthesiologist, CRNA and Surgeon  Anesthesia Plan Comments: (History of hydrocephalus that improved with shunting,  No active symptoms.  No problem with spinal in 2020.  Patient was consented for risk of shunt infection and or herniation and she voiced understanding.  Patient reports no bleeding problems and no anticoagulant use.  Plan for spinal with backup GA  Patient consented for risks of anesthesia including but not limited to:  - adverse reactions to medications - damage to eyes, teeth, lips or other oral mucosa - nerve damage due to positioning  - risk of bleeding, infection and or nerve damage from spinal that could lead to paralysis - risk of headache or failed spinal - damage to teeth, lips or other oral mucosa - sore throat or hoarseness - damage to heart, brain, nerves, lungs, other parts of body or loss of life  Patient voiced understanding.)        Anesthesia Quick Evaluation

## 2020-07-27 NOTE — H&P (Signed)
Chief Complaint  Patient presents with  . Right Hip - Pre-op Exam  . Pre-op Exam  H&P right hip THA 07/27/20 Michele Newton   Reason for Visit Michele Newton is a 65 y.o. who presents today for history and physical. She is to undergo a right total hip arthroplasty on 07/27/2020. Was last seen in the clinic on 06/21/2020. There is been no change in her condition since that date. Patient is status post left total hip arthroplasty performed in 2020.  Patient has a long history of lower back and bilateral hip pain. She subsequently underwent surgery on the left hip and has done well. However she is continued have problems with the right hip. She has been seen at [facility] due to a sudden onset of severe hip pain. At that time she was unable to raise her right lower extremity to get in a car for several days. She was noted to have pain with ambulation. She is also been seen by Dr. Candelaria Stagers in January 2020 for which a intra-articular injection was given with no improvement in her condition. She has been followed in physiatry for lower back and has had injections. However she continues to have discomfort to the right groin region and difficulty ambulation and since she got relief with the left hip has elected to proceed with having a right total hip arthroplasty.  Past Medical History Past Medical History:  Diagnosis Date  . Acute bursitis of left shoulder 05/20/2017  . Acute tension headache  . Allergy  . Anxiety  . Anxiety and depression 06/11/2013  Last Assessment & Plan: Formatting of this note might be different from the original. Reports she has previously been on Abilify 2mg  and has had refills left over, has restarted on this medication within the past few weeks with improvement in anxiety/depression. Requesting to restart. Refills sent to pharmacy on file.  Marland Kitchen Anxiety state 06/11/2013  . Arthritis  . Chronic left hip pain 02/26/2018  . Depression  . Esophageal reflux 07/09/2014  Last Assessment & Plan: D/c'd  Dexilant due to cost. Omeprazole 40 mg daily for GERD. Will follow.  . Essential hypertension 02/06/2018  Last Assessment & Plan: Uncontrolled hypertension. BP goal < 130/80. Pt is not working on lifestyle modifications. Taking medications tolerating well without side effects. Complications: IIH, Obesity, headache, anxiety Plan: 1. Continue taking VerapamilCR 120 mg once daily - INCREASE valsartan to 160 mg once daily - Consider adding HCTZ at next visit if remains elevated. 2. Obtain labs next v  . Headache 12/19/2014  . Headache disorder 12/04/2016  . History of ventricular shunt 12/19/2014  . Localized osteoarthrosis of left hip 08/26/2018  Advanced left hip osteoarthritis  . Lumbar stenosis without neurogenic claudication 10/06/2019  . Psoriasis  . Psoriatic arthropathy (CMS-HCC) 07/27/2013  . S/P VP shunt  . Sleep disturbance 07/09/2014  Last Assessment & Plan: Refilled seroquel and asked her to read about insomnia and come up with a bed time routine to better help with sleep. Will follow.  Marland Kitchen Spondylosis without myelopathy or radiculopathy, lumbar region 09/14/2019  . Status post total hip replacement, left 11/19/2018   Past Surgical History Past Surgical History:  Procedure Laterality Date  . ear tubes  . FUNCTIONAL ENDOSCOPIC SINUS SURGERY  . JOINT REPLACEMENT  . NEUROENDOSCOPIC PLACEMENT / REPLACEMENT VENTRICULAR CATHETER W/ ATTACHMENT SHUNT / EXTERNAL DRAIN Right 1994  . right ventricular shunt  . TONSILLECTOMY  . TUBAL LIGATION   Past Family History Family History  Problem Relation Age of Onset  .  Coronary Artery Disease (Blocked arteries around heart) Mother  . Arthritis Mother  . Arthritis Father  . Stroke Sister  . Gout Brother   Medications Current Outpatient Medications Ordered in Epic  Medication Sig Dispense Refill  . albuterol 90 mcg/actuation inhaler Inhale 2 inhalations into the lungs every 6 (six) hours as needed  . budesonide-formoteroL (SYMBICORT) 160-4.5  mcg/actuation inhaler Inhale into the lungs  . fluticasone (FLONASE) 50 mcg/actuation nasal spray by Nasal route.  . methocarbamoL (ROBAXIN) 500 MG tablet Take 1 tablet by mouth every 6 (six) hours as needed  . montelukast (SINGULAIR) 10 mg tablet Take by mouth.  . naltrexone-bupropion 8-90 mg TbER Start 1 tablet every morning for 7 days, then 1 tablet twice daily for 7 days, then 2 tablets every morning and one in the evening, then 2 tablets twice daily  . pantoprazole (PROTONIX) 40 MG DR tablet Take 40 mg by mouth once daily  . PARoxetine (PAXIL) 40 MG tablet Take 1 tablet by mouth once daily  . promethazine-dextromethorphan (PROMETHAZINE-DM) 6.25-15 mg/5 mL syrup Take by mouth Take 5 mLs by mouth 4 (four) times daily as needed for cough  . QUEtiapine (SEROQUEL) 200 MG tablet Take 1 tablet by mouth once daily  . traZODone (DESYREL) 50 MG tablet Take by mouth Take 0.5-1 tablets (25-50 mg total) by mouth at bedtime as needed for sleep  . verapamiL (CALAN) 120 MG tablet Take by mouth Take 1 tablet (120 mg total) by mouth daily.  Marland Kitchen amoxicillin-clavulanate (AUGMENTIN) 875-125 mg tablet Take 1 tablet by mouth 2 (two) times daily  . ARIPiprazole (ABILIFY) 2 MG tablet Take 1 tablet by mouth once daily  . azelastine (ASTELIN) 137 mcg nasal spray USE 1 TO 2 SPRAYS IN EACH NOSTRIL TWICE DAILY  . codeine-guaifenesin 10-100 mg/5 mL oral liquid TAKE 5 ML BY MOUTH THREE TIMES DAILY AS NEEDED FOR COUGH  . diclofenac (VOLTAREN) 75 MG EC tablet Take 1 tablet by mouth 2 (two) times daily as needed  . doxycycline (VIBRAMYCIN) 100 MG capsule TAKE 1 CAPSULE BY MOUTH TWICE DAILY FOR 10 DAYS. DO NOT TAKE VITAMINS OR ANTACIDS WHILE TAKING THIS MEDICATION  . fluticasone furoate-vilanteroL (BREO ELLIPTA) 200-25 mcg/dose DsDv Inhale into the lungs (Patient not taking: Reported on 10/06/2019 )  . fluticasone propion-salmeteroL (WIXELA INHUB) 250-50 mcg/dose diskus inhaler  . gabapentin (NEURONTIN) 100 MG capsule  .  levocetirizine (XYZAL) 5 MG tablet Take 5 mg by mouth every evening  . lubiprostone (AMITIZA) 24 MCG capsule Take 1 capsule by mouth once daily  . ofloxacin (OCUFLOX) 0.3 % ophthalmic solution INSTILL 4 DROPS TO RIGHT EAR TWICE DAILY FOR 10 DAYS  . oxyCODONE-acetaminophen (PERCOCET) 5-325 mg tablet TAKE 1 TABLET BY MOUTH EVERY 6 HOURS FOR UP TO 3 DAYS AS NEEDED FOR SEVERE PAIN  . valACYclovir (VALTREX) 1000 MG tablet  . valsartan (DIOVAN) 160 MG tablet Take 1 tablet by mouth once daily   No current Epic-ordered facility-administered medications on file.   Allergies Allergies  Allergen Reactions  . Amitriptyline Hallucination  hallucinations  . Haloperidol Other (See Comments)  Paralyzed vocal cords  . Hydrocodone-Acetaminophen Itching  . Venlafaxine Other (See Comments)  Amnesia, hospitalized x 2 weeks    Review of Systems A comprehensive 14 point ROS was performed, reviewed, and the pertinent orthopaedic findings are documented in the HPI.  Exam BP 136/86  Ht 167.6 cm (5\' 6" )  Wt 95 kg (209 lb 6.4 oz)  LMP (LMP Unknown)  BMI 33.80 kg/m   General:  Well-developed well-nourished female seen in no acute distress.   HEENT: Atraumatic,normocephalic. Pupils are equal and reactive to light. Oropharynx is clear with moist mucosa  Lungs: Clear to auscultation bilaterally   Cardiovascular: Regular rate and rhythm. Normal S1, S2. No murmurs. No appreciable gallops or rubs. Peripheral pulses are palpable.  Abdomen: Soft, non-tender, nondistended. Bowel sounds present  Extremity: The patient has right hip internal rotation to 10 degrees with severe pain and external rotation is to 50 degrees. She is tender to the groin on the right when lying flat. She has right hip flexion to 110 degrees and this causes severe pain with internal rotation and extreme flexion.   Neurological:  The patient is alert and oriented Sensation to light touch appears to be intact and within normal  limits Gross motor strength appeared to be equal to 5/5  Vascular :  Peripheral pulses felt to be palpable. Capillary refill appears to be intact and within normal limits  X-ray  X-rays of the right hip taken in [facility] showed central arthritis with large osteophyte formation.  Impression  1. Degenerative arthrosis right hip  Plan   1. Patient is to discontinue her Voltaren 1 week prior to surgery 2. Did discuss postop rehab program 3. Return to clinic 6 weeks postop. Sooner if any problems  This note was generated in part with voice recognition software and I apologize for any typographical errors that were not detected and corrected   Watt Climes PA  Electronically signed by Regino Bellow, PA at 07/12/2020 11:17 AM EST   Reviewed  H+P. No changes noted.

## 2020-07-27 NOTE — Transfer of Care (Addendum)
Immediate Anesthesia Transfer of Care Note  Patient: Michele Newton  Procedure(s) Performed: TOTAL HIP ARTHROPLASTY ANTERIOR APPROACH (Right Hip)  Patient Location: PACU  Anesthesia Type:General  Level of Consciousness: drowsy  Airway & Oxygen Therapy: Patient Spontanous Breathing and Patient connected to face mask oxygen  Post-op Assessment: Report given to RN and Post -op Vital signs reviewed and stable  Post vital signs: Reviewed and stable  Last Vitals:  Vitals Value Taken Time  BP 114/67 07/27/20 1445  Temp    Pulse 75 07/27/20 1447  Resp 17 07/27/20 1447  SpO2 97 % 07/27/20 1447  Vitals shown include unvalidated device data.  Last Pain:  Vitals:   07/27/20 1108  TempSrc: Temporal  PainSc: 0-No pain         Complications: No complications documented.

## 2020-07-28 ENCOUNTER — Encounter: Payer: Self-pay | Admitting: Orthopedic Surgery

## 2020-07-28 LAB — CBC
HCT: 37 % (ref 36.0–46.0)
Hemoglobin: 12.3 g/dL (ref 12.0–15.0)
MCH: 28.8 pg (ref 26.0–34.0)
MCHC: 33.2 g/dL (ref 30.0–36.0)
MCV: 86.7 fL (ref 80.0–100.0)
Platelets: 324 10*3/uL (ref 150–400)
RBC: 4.27 MIL/uL (ref 3.87–5.11)
RDW: 13.5 % (ref 11.5–15.5)
WBC: 10 10*3/uL (ref 4.0–10.5)
nRBC: 0 % (ref 0.0–0.2)

## 2020-07-28 LAB — BASIC METABOLIC PANEL
Anion gap: 6 (ref 5–15)
BUN: 13 mg/dL (ref 8–23)
CO2: 25 mmol/L (ref 22–32)
Calcium: 8.6 mg/dL — ABNORMAL LOW (ref 8.9–10.3)
Chloride: 106 mmol/L (ref 98–111)
Creatinine, Ser: 0.89 mg/dL (ref 0.44–1.00)
GFR, Estimated: >60 mL/min (ref >60)
Glucose, Bld: 122 mg/dL — ABNORMAL HIGH (ref 70–99)
Potassium: 4.1 mmol/L (ref 3.5–5.1)
Sodium: 137 mmol/L (ref 135–145)

## 2020-07-28 MED ORDER — OXYCODONE HCL 5 MG PO TABS
5.0000 mg | ORAL_TABLET | ORAL | Status: DC | PRN
  Administered 2020-07-28 – 2020-07-30 (×8): 10 mg via ORAL
  Filled 2020-07-28 (×8): qty 2

## 2020-07-28 MED ORDER — METHOCARBAMOL 500 MG PO TABS: 500.0000 mg | ORAL_TABLET | Freq: Four times a day (QID) | ORAL | 0 refills | Status: DC | PRN

## 2020-07-28 MED ORDER — DOCUSATE SODIUM 100 MG PO CAPS
100.0000 mg | ORAL_CAPSULE | Freq: Two times a day (BID) | ORAL | 0 refills | Status: DC
Start: 2020-07-28 — End: 2020-10-25

## 2020-07-28 MED ORDER — HYDROCODONE-ACETAMINOPHEN 5-325 MG PO TABS
1.0000 | ORAL_TABLET | ORAL | 0 refills | Status: DC | PRN
Start: 2020-07-28 — End: 2020-07-28

## 2020-07-28 MED ORDER — TRAMADOL HCL 50 MG PO TABS: 50.0000 mg | ORAL_TABLET | Freq: Four times a day (QID) | ORAL | 0 refills | Status: DC

## 2020-07-28 MED ORDER — ENOXAPARIN SODIUM 40 MG/0.4ML ~~LOC~~ SOLN: 40.0000 mg | SUBCUTANEOUS | 0 refills | Status: DC

## 2020-07-28 MED ORDER — OXYCODONE HCL 5 MG PO TABS: 5.0000 mg | ORAL_TABLET | ORAL | 0 refills | Status: DC | PRN

## 2020-07-28 NOTE — Discharge Summary (Signed)
Physician Discharge Summary  Patient ID: Michele Newton MRN: [id number] DOB/AGE: 08-14-1955 65 y.o.  Admit date: 07/27/2020 Discharge date: July 30, 2020 Admission Diagnoses:  S/P hip replacement [Z96.649]   Discharge Diagnoses: Patient Active Problem List   Diagnosis Date Noted  . S/P hip replacement 07/27/2020  . IBS (irritable bowel syndrome) 06/20/2020  . Viral infection 04/20/2020  . Right hip pain 03/13/2020  . Class 1 obesity without serious comorbidity with body mass index (BMI) of 33.0 to 33.9 in adult 03/13/2020  . Shortness of breath 02/29/2020  . Psychophysiological insomnia 02/29/2020  . Cough 02/23/2020  . Exacerbation of asthma 02/23/2020  . Herpes zoster 10/26/2019  . Left wrist pain 09/15/2019  . Enrolled in chronic care management 09/15/2019  . Hoarseness of voice 09/15/2019  . Elevated glucose 08/09/2019  . Status post total hip replacement, left 11/19/2018  . Other insomnia 02/06/2018  . Essential hypertension 02/06/2018  . Mild intermittent asthma without complication 29/51/8841  . Acute bursitis of left shoulder 05/20/2017  . Arthritis 04/08/2017  . Chronic midline low back pain without sciatica 04/08/2017  . Generalized osteoarthritis of hand 04/08/2017  . Psoriasis 04/08/2017  . Headache 12/03/2016  . Abdominal pain, left lower quadrant 03/12/2016  . Rhinitis, allergic 12/21/2015  . H/O cold sores 10/01/2015  . Cavus deformity of foot, acquired 08/08/2015  . Plantar fasciitis 08/08/2015  . 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 Right foot pain 07/28/2014  . GERD (gastroesophageal reflux disease) 07/09/2014  . Sleep disorder 07/09/2014  . Back pain 07/27/2013  . Chronic left hip pain 07/27/2013  . Left knee pain 07/27/2013  . Psoriatic arthritis ([place]) 07/27/2013  . Anxiety and depression 06/11/2013  . Right otitis media 04/16/2013  . Sinusitis 02/11/2013  . Weight gain 02/11/2013  . Screening for breast cancer 02/11/2013    Past Medical History:   Diagnosis Date  . Anxiety   . Arthritis   . Asthma   . Depression   . Frequent headaches   . GERD (gastroesophageal reflux disease)   . Hydrocephalus ([place]) 1995  . Migraine   . Psoriatic arthritis ([place])      Transfusion: none   Consultants (if any):   Discharged Condition: Improved  Hospital Course: Michele Newton is an 65 y.o. female who was admitted 07/27/2020 with a diagnosis of <principal problem not specified> and went to the operating room on 07/27/2020 and underwent the above named procedures.    Surgeries: Procedure(s): TOTAL HIP ARTHROPLASTY ANTERIOR APPROACH on 07/27/2020 Patient tolerated the surgery well. Taken to PACU where she was stabilized and then transferred to the orthopedic floor.  Started on Lovenox 40 mg q 24 hrs. Foot pumps applied bilaterally at 80 mm. Heels elevated on bed with rolled towels. No evidence of DVT. Negative [place]. Physical therapy started on day #1 for gait training and transfer. OT started day #1 for ADL and assisted devices.  Patient's foley was d/c on day #1.   On post op day #3 patient was stable and ready for discharge home with home health physical therapy.  Implants:  Medacta AMIS 2 standard stem with 50 mm mPACT DM cup and liner with ceramic S 28 mm head  She was given perioperative antibiotics:  Anti-infectives (From admission, onward)   Start     Dose/Rate Route Frequency Ordered Stop   07/27/20 1900  ceFAZolin (ANCEF) IVPB 2g/100 mL premix        2 g 200 mL/hr over 30 Minutes Intravenous Every 6 hours 07/27/20 1627 07/28/20  0031   07/27/20 1100  ceFAZolin (ANCEF) IVPB 2g/100 mL premix        2 g 200 mL/hr over 30 Minutes Intravenous On call to O.R. 07/27/20 1057 07/27/20 1301    .  She was given sequential compression devices, early ambulation, and Lovneox TEDs for DVT prophylaxis.  She benefited maximally from the hospital stay and there were no complications.    Recent vital signs:  Vitals:   07/29/20 2353 07/30/20  0449  BP: 109/60 123/76  Pulse: 78 82  Resp: 15 18  Temp: 98.1 F (36.7 C) 97.6 F (36.4 C)  SpO2: 92% 91%    Recent laboratory studies:  Lab Results  Component Value Date   HGB 11.6 (L) 07/29/2020   HGB 12.3 07/28/2020   HGB 13.5 07/27/2020   Lab Results  Component Value Date   WBC 10.4 07/29/2020   PLT 302 07/29/2020   Lab Results  Component Value Date   INR 1.0 11/11/2018   Lab Results  Component Value Date   NA 137 07/28/2020   K 4.1 07/28/2020   CL 106 07/28/2020   CO2 25 07/28/2020   BUN 13 07/28/2020   CREATININE 0.89 07/28/2020   GLUCOSE 122 (H) 07/28/2020    Discharge Medications:   Allergies as of 07/30/2020      Reactions   Amitriptyline Other (See Comments)   hallucinations   Effexor [venlafaxine] Other (See Comments)   Amnesia, hospitalized x 2 weeks   Haldol [haloperidol] Other (See Comments)   Paralyzed vocal cords       Medication List    TAKE these medications   albuterol 108 (90 Base) MCG/ACT inhaler Commonly known as: VENTOLIN HFA INHALE 1 TO 2 PUFFS INTO THE LUNGS EVERY 6 HOURS AS NEEDED FOR WHEEZING OR SHORTNESS OF BREATH What changed: See the new instructions.   ARIPiprazole 2 MG tablet Commonly known as: Abilify Take 1 tablet (2 mg total) by mouth daily.   budesonide-formoterol 160-4.5 MCG/ACT inhaler Commonly known as: SYMBICORT Inhale 2 puffs into the lungs 2 (two) times daily.   docusate sodium 100 MG capsule Commonly known as: COLACE Take 1 capsule (100 mg total) by mouth 2 (two) times daily.   enoxaparin 40 MG/0.4ML injection Commonly known as: LOVENOX Inject 0.4 mLs (40 mg total) into the skin daily for 14 days.   fluticasone 50 MCG/ACT nasal spray Commonly known as: FLONASE Place 2 sprays into both nostrils daily. Use for 4-6 weeks then stop and use seasonally or as needed.   lubiprostone 24 MCG capsule Commonly known as: AMITIZA Take 1 capsule (24 mcg total) by mouth 2 (two) times daily with a meal.    methocarbamol 500 MG tablet Commonly known as: ROBAXIN Take 1 tablet (500 mg total) by mouth every 6 (six) hours as needed for muscle spasms.   montelukast 10 MG tablet Commonly known as: SINGULAIR Take 1 tablet (10 mg total) by mouth at bedtime.   Naltrexone-buPROPion HCl ER 8-90 MG Tb12 Start 1 tablet every morning for 7 days, then 1 tablet twice daily for 7 days, then 2 tablets every morning and one in the evening, then 2 tablets twice daily   oxyCODONE 5 MG immediate release tablet Commonly known as: Oxy IR/ROXICODONE Take 1-2 tablets (5-10 mg total) by mouth every 4 (four) hours as needed for moderate pain.   pantoprazole 40 MG tablet Commonly known as: PROTONIX TAKE 1 TABLET(40 MG) BY MOUTH TWICE DAILY What changed: See the new instructions.   PARoxetine  40 MG tablet Commonly known as: PAXIL Take 1 tablet (40 mg total) by mouth at bedtime.   QUEtiapine 200 MG tablet Commonly known as: SEROQUEL Take 1 tablet (200 mg total) by mouth at bedtime.   traMADol 50 MG tablet Commonly known as: ULTRAM Take 1 tablet (50 mg total) by mouth every 6 (six) hours.   traZODone 50 MG tablet Commonly known as: DESYREL Take 0.5-1 tablets (25-50 mg total) by mouth at bedtime as needed for sleep. What changed:   how much to take  when to take this   verapamil 120 MG tablet Commonly known as: CALAN Take 1 tablet (120 mg total) by mouth daily.       Diagnostic Studies: DG HIP OPERATIVE UNILAT W OR W/O PELVIS RIGHT  Result Date: 07/27/2020 CLINICAL DATA:  Right total hip arthroplasty, intraoperative examination EXAM: OPERATIVE RIGHT HIP (WITH PELVIS IF PERFORMED) SINGLE VIEWS TECHNIQUE: Fluoroscopic spot image(s) were submitted for interpretation post-operatively. COMPARISON:  None. FINDINGS: Nine fluoroscopic intraoperative radiographs demonstrate surgical changes of right total hip arthroplasty. The inferior aspect of the femoral stem is not included on this limited examination.  Normal alignment on this limited exam. No unexpected fracture or dislocation. FLUOROSCOPY TIME:  Time: 0.3 minutes Images: 9 Dose: 6.4 mGy IMPRESSION: Intraoperative changes of right total hip arthroplasty. Electronically Signed   By: Fidela Salisbury MD   On: 07/27/2020 15:34   DG HIP UNILAT W OR W/O PELVIS 2-3 VIEWS RIGHT  Result Date: 07/27/2020 CLINICAL DATA:  Right total hip arthroplasty EXAM: DG HIP (WITH OR WITHOUT PELVIS) 2-3V RIGHT COMPARISON:  None. FINDINGS: Two view radiograph right hip demonstrates surgical changes of total hip arthroplasty. Arthroplasty components are in anatomic alignment. Normal overall alignment. No unexpected fracture or dislocation. Gas is seen within the soft tissues surrounding the right hip. Wound VAC is in place. IMPRESSION: Status post right total hip arthroplasty. No unexpected fracture or dislocation. Electronically Signed   By: Fidela Salisbury MD   On: 07/27/2020 15:35    Disposition: Discharge disposition: 01-Home or Self Care          Follow-up Information    Duanne Guess, PA-C. Go in 2 week(s).   Specialties: Orthopedic Surgery, Emergency Medicine Why: For staple removal Contact information: [place] [address] [phone]                Signed: Prescott Parma, TODD 07/30/2020, 7:22 AM

## 2020-07-28 NOTE — Progress Notes (Signed)
   Subjective: 1 Day Post-Op Procedure(s) (LRB): TOTAL HIP ARTHROPLASTY ANTERIOR APPROACH (Right) Patient reports pain as moderate.   Patient is well, and has had no acute complaints or problems Denies any CP, SOB, ABD pain. We will continue therapy today.  Plan is to go Home after hospital stay.  Objective: Vital signs in last 24 hours: Temp:  [97.4 F (36.3 C)-99 F (37.2 C)] 98.3 F (36.8 C) (03/25 0534) Pulse Rate:  [60-98] 98 (03/25 0534) Resp:  [9-18] 18 (03/25 0534) BP: (89-130)/(59-91) 118/78 (03/25 0534) SpO2:  [88 %-97 %] 94 % (03/25 0534)  Intake/Output from previous day: 03/24 0701 - 03/25 0700 In: 1044.8 [P.O.:460; I.V.:584.8] Out: 775 [Urine:400; Drains:350; Blood:25] Intake/Output this shift: No intake/output data recorded.  Recent Labs    07/27/20 1636 07/28/20 0527  HGB 13.5 12.3   Recent Labs    07/27/20 1636 07/28/20 0527  WBC 15.2* 10.0  RBC 4.69 4.27  HCT 41.1 37.0  PLT 373 324   Recent Labs    07/27/20 1636 07/28/20 0527  NA  --  137  K  --  4.1  CL  --  106  CO2  --  25  BUN  --  13  CREATININE 1.04* 0.89  GLUCOSE  --  122*  CALCIUM  --  8.6*   No results for input(s): LABPT, INR in the last 72 hours.  EXAM General - Patient is Alert, Appropriate and Oriented Extremity - Neurovascular intact Sensation intact distally Intact pulses distally Dorsiflexion/Plantar flexion intact No cellulitis present Compartment soft Dressing - dressing C/D/I and no drainage, prevena intact with out drainage Motor Function - intact, moving foot and toes well on exam.   Past Medical History:  Diagnosis Date  . Anxiety   . Arthritis   . Asthma   . Depression   . Frequent headaches   . GERD (gastroesophageal reflux disease)   . Hydrocephalus ([place]) 1995  . Migraine   . Psoriatic arthritis (HCC)     Assessment/Plan:   1 Day Post-Op Procedure(s) (LRB): TOTAL HIP ARTHROPLASTY ANTERIOR APPROACH (Right) Active Problems:   S/P hip  replacement  Estimated body mass index is 33.44 kg/m as calculated from the following:   Height as of 07/18/20: 5\' 6"  (1.676 m).   Weight as of 07/18/20: 94 kg. Advance diet Up with therapy  Work on [employer] and VSS Pain controlled CM to assist with discharge to home with HHPT  DVT Prophylaxis - Lovenox, TED hose and SCDs Weight-Bearing as tolerated to right leg   T. Rachelle Hora, PA-C [place] 07/28/2020, 7:26 AM

## 2020-07-28 NOTE — Progress Notes (Signed)
End of Shift Summary:  Date: 07/28/20 Shift: 0700-1500 Ambulatory: x1 pivot to [facility] with walker Significant Events: no significant events this shift.

## 2020-07-28 NOTE — Evaluation (Signed)
Physical Therapy Evaluation Patient Details Name: Michele Newton MRN: [id number] DOB: [birth year] Today's Date: 07/28/2020   History of Present Illness  Michele Newton is a 65 y.o. She is to undergo a right total hip arthroplasty on 07/27/2020. Was last seen in the clinic on 06/21/2020. There is been no change in her condition since that date. Patient is status post left total hip arthroplasty performed in 2020. Pt has a PMH of anxiety, depression, hx of LBP.    Clinical Impression  Pt admitted with above diagnosis. Pt supine with HOB elevated agreeable to PT services however reporting pain. Vitals assessed prior to mobility due to elevated diastolic earlier this a.m. BP: 118/80 mm Hg, HR: 101 BPM. Pt educated on and performed supine exercises. Required modA on shoulders, minA on RLE with HOB elevated and use of bed rail to sit EOB due to R hip surgical pain. With bed elevated pt able to stand with Mod-I via RW. x2 bouts of R knee buckling due to pt reports of R hip pain so ambulation deferred. Stand pivot/step transfer with RW into recliner minGuard with extra time to complete task. Prior to operation pt was independent with all mobility and transfers and is limited this morning due to her pain. Pt has strong, 24/7 family support from spouse and sister as needed and anticipate once pain is managed pt will be able to safely ambulate and attempt asc/desc stairs which pt needs to do in order to enter/exit home safely. Pt educated on need to ambulate safe household distances and asc/desc stairs before PT able to confidently recommend d/c home with pt verbalizing understanding. Pt d/c at this time for SNF pending mobility status with future PT sessions. Pt currently with functional limitations due to the deficits listed below (see PT Problem List). Pt will benefit from skilled PT to increase their independence and safety with mobility to allow discharge to the venue listed below.      Follow Up Recommendations SNF     Equipment Recommendations  None recommended by PT    Recommendations for Other Services       Precautions / Restrictions Precautions Precautions: Anterior Hip;Fall (x2 bouts of R knee buckling with RW. Pt states due to surgical pain.) Precaution Booklet Issued: Yes (comment) Restrictions Weight Bearing Restrictions: Yes RLE Weight Bearing: Weight bearing as tolerated      Mobility  Bed Mobility Overal bed mobility: Needs Assistance Bed Mobility: Supine to Sit     Supine to sit: Mod assist;HOB elevated     General bed mobility comments: Pain limiting abiliyt to sit EOB. Required ModA at shoulders and MinA for RLE managnment and bed rail.    Transfers Overall transfer level: Needs assistance Equipment used: Rolling walker (2 wheeled) Transfers: Sit to/from [facility] Sit to Stand: Modified independent (Device/Increase time);From elevated surface Stand pivot transfers: Min guard;Modified independent (Device/Increase time)          Ambulation/Gait Ambulation/Gait assistance:  (Deferred this am due to pain and knee R knee buckling)   Assistive device: Rolling walker (2 wheeled)          Stairs            Wheelchair Mobility    Modified Rankin (Stroke Patients Only)       Balance Overall balance assessment: Needs assistance Sitting-balance support: Bilateral upper extremity supported Sitting balance-Leahy Scale: Good     Standing balance support: Bilateral upper extremity supported Standing balance-Leahy Scale: Poor Standing balance comment: Requires BUE support on  RW to stand                             Pertinent Vitals/Pain Pain Assessment: Faces Faces Pain Scale: Hurts even more Pain Location: R hip Pain Descriptors / Indicators: Aching;Grimacing;Guarding Pain Intervention(s): Limited activity within patient's tolerance;Monitored during session;Premedicated before session;Repositioned    Home Living  Family/patient expects to be discharged to:: Private residence Living Arrangements: Spouse/significant other Available Help at Discharge: Family;Available 24 hours/day Type of Home: House Home Access: Stairs to enter Entrance Stairs-Rails: Right;Left (Pt uses single rail.) Entrance Stairs-Number of Steps: 4 Home Layout: Two level;Able to live on main level with bedroom/bathroom Home Equipment: Gilford Rile - 2 wheels;Toilet riser;Shower seat;Cane - single point;Grab bars - tub/shower      Prior Function Level of Independence: Independent               Hand Dominance   Dominant Hand: Right    Extremity/Trunk Assessment   Upper Extremity Assessment Upper Extremity Assessment: Defer to OT evaluation    Lower Extremity Assessment Lower Extremity Assessment: Generalized weakness RLE Sensation: WNL    Cervical / Trunk Assessment Cervical / Trunk Assessment: Normal  Communication   Communication: No difficulties  Cognition Arousal/Alertness: Awake/alert Behavior During Therapy: WFL for tasks assessed/performed Overall Cognitive Status: Within Functional Limits for tasks assessed                                        General Comments      Exercises Total Joint Exercises Ankle Circles/Pumps: AROM;Both;20 reps;Supine Quad Sets: AROM;Strengthening;Right;10 reps;Supine Hip ABduction/ADduction: AAROM;Right;10 reps Long Arc Quad: AROM;Strengthening;Right;10 reps;Seated Other Exercises Other Exercises: stand pivot transfer with RW into recliner Other Exercises: Standing weight shifts inside RW Other Exercises: Alternating marches inside RW (x5/LE   Assessment/Plan    PT Assessment Patient needs continued PT services  PT Problem List Decreased strength;Decreased range of motion;Decreased activity tolerance;Decreased balance;Pain;Decreased mobility       PT Treatment Interventions DME instruction;Balance training;Gait training;Neuromuscular  re-education;Stair training;Functional mobility training;Patient/family education;Therapeutic activities;Therapeutic exercise    PT Goals (Current goals can be found in the Care Plan section)  Acute Rehab PT Goals Patient Stated Goal: Return home PT Goal Formulation: With patient Time For Goal Achievement: 07/30/20 Potential to Achieve Goals: Good    Frequency BID   Barriers to discharge   Ability to safely asc/desc 4 stairs to enter end exit home    Co-evaluation               AM-PAC PT "6 Clicks" Mobility  Outcome Measure Help needed turning from your back to your side while in a flat bed without using bedrails?: A Lot Help needed moving from lying on your back to sitting on the side of a flat bed without using bedrails?: A Lot Help needed moving to and from a bed to a chair (including a wheelchair)?: A Little Help needed standing up from a chair using your arms (e.g., wheelchair or bedside chair)?: A Little Help needed to walk in hospital room?: A Lot Help needed climbing 3-5 steps with a railing? : Total 6 Click Score: 13    End of Session Equipment Utilized During Treatment: Gait belt Activity Tolerance: Patient limited by pain Patient left: in chair;with SCD's reapplied;with call bell/phone within reach;with chair alarm set Nurse Communication: Mobility status PT Visit Diagnosis: Unsteadiness on  feet (R26.81);Other abnormalities of gait and mobility (R26.89);Pain;Difficulty in walking, not elsewhere classified (R26.2);Muscle weakness (generalized) (M62.81) Pain - Right/Left: Right Pain - part of body: Hip    Time: 9030-1499 PT Time Calculation (min) (ACUTE ONLY): 41 min   Charges:   PT Evaluation $PT Eval Low Complexity: 1 Low PT Treatments $Therapeutic Exercise: 23-37 mins       Sudiksha Victor M. Fairly IV, PT, DPT Physical Therapist- [facility]  07/28/2020, 10:04 AM

## 2020-07-28 NOTE — Evaluation (Signed)
Occupational Therapy Evaluation Patient Details Name: Michele Newton MRN: [id number] DOB: February 13, 1956 Today's Date: 07/28/2020    History of Present Illness Michele Newton is a 65 y.o. She is to undergo a right total hip arthroplasty on 07/27/2020. Was last seen in the clinic on 06/21/2020. There is been no change in her condition since that date. Patient is status post left total hip arthroplasty performed in 2020. Pt has a PMH of anxiety, depression, hx of LBP.   Clinical Impression   Pt seen for OT evaluation this date, POD#1 from above surgery. Pt was independent in all ADLs prior to surgery. Pt is eager to return to PLOF with less pain and improved safety and independence. Pt currently requires Mod A for bed mobility, 2/2 pain. Poor standing balance w/ RW and limited standing tolerance, 2/2 pain. Pt instructed in self care skills, falls prevention strategies, home/routines modifications, DME/AE for LB bathing and dressing tasks, compression stocking mgt strategies, and car transfer techniques. Pt's husband will be home 24/7 for 1 week post surgery, then pt's sister, who is a Marine scientist, will stay with pt 24/7 for 1 week. Pt has 4-5 STE her home. Pt would benefit from additional instruction in self care skills while hospitalized, followed by [facility] post DC to enhance safety, mobility, and return to PLOF.     Follow Up Recommendations  Home health OT    Equipment Recommendations  None recommended by OT    Recommendations for Other Services       Precautions / Restrictions Precautions Precautions: Anterior Hip;Fall Precaution Booklet Issued: Yes (comment) Restrictions Weight Bearing Restrictions: Yes RLE Weight Bearing: Weight bearing as tolerated      Mobility Bed Mobility Overal bed mobility: Needs Assistance Bed Mobility: Supine to Sit     Supine to sit: Mod assist;HOB elevated     General bed mobility comments: limited by pain    Transfers Overall transfer level: Needs  assistance Equipment used: Rolling walker (2 wheeled) Transfers: Sit to/from [facility] Sit to Stand: Supervision Stand pivot transfers: Supervision       General transfer comment: increased time/effort. Limited by pain    Balance Overall balance assessment: Needs assistance Sitting-balance support: Bilateral upper extremity supported Sitting balance-Leahy Scale: Good     Standing balance support: Bilateral upper extremity supported Standing balance-Leahy Scale: Poor Standing balance comment: heavy reliance on UE support                           ADL either performed or assessed with clinical judgement   ADL Overall ADL's : Needs assistance/impaired                     Lower Body Dressing: Maximal assistance Lower Body Dressing Details (indicate cue type and reason): donning/doffing socks                     Vision Patient Visual Report: No change from baseline       Perception     Praxis      Pertinent Vitals/Pain Pain Assessment: 0-10 Faces Pain Scale: Hurts even more Pain Location: R hip Pain Descriptors / Indicators: Aching;Grimacing;Guarding Pain Intervention(s): Limited activity within patient's tolerance;Ice applied;Monitored during session;Repositioned     Hand Dominance Right   Extremity/Trunk Assessment Upper Extremity Assessment Upper Extremity Assessment: Overall WFL for tasks assessed   Lower Extremity Assessment Lower Extremity Assessment: RLE deficits/detail RLE Deficits / Details: pain, weakness, limited ROM  s/p R THA RLE Sensation: WNL   Cervical / Trunk Assessment Cervical / Trunk Assessment: Normal   Communication Communication Communication: No difficulties   Cognition Arousal/Alertness: Awake/alert Behavior During Therapy: WFL for tasks assessed/performed Overall Cognitive Status: Within Functional Limits for tasks assessed                                     General  Comments       Exercises Total Joint Exercises Ankle Circles/Pumps: AROM;Both;10 reps;Seated Quad Sets: AROM;Strengthening;Right;10 reps;Supine Hip ABduction/ADduction: AAROM;Right;10 reps Long Arc Quad: AROM;Strengthening;Right;10 reps;Seated Other Exercises Other Exercises: stand pivot transfer with RW into recliner Other Exercises: Standing weight shifts inside RW Other Exercises: Alternating marches inside RW (x5/LE Other Exercises: Educ re: AE for LB dressing/bathing, car t/f technique, pet mgmt, falls prevention   Shoulder Instructions      Home Living Family/patient expects to be discharged to:: Private residence Living Arrangements: Spouse/significant other Available Help at Discharge: Family;Available 24 hours/day Type of Home: House Home Access: Stairs to enter [employer] of Steps: 4 Entrance Stairs-Rails: Right;Left Home Layout: Two level;Able to live on main level with bedroom/bathroom     Bathroom Shower/Tub: Hospital doctor Toilet: Handicapped height Bathroom Accessibility: No   Home Equipment: Environmental consultant - 2 wheels;Toilet riser;Shower seat;Cane - single point;Grab bars - tub/shower          Prior Functioning/Environment Level of Independence: Independent                 OT Problem List: Decreased strength;Decreased range of motion;Decreased activity tolerance;Impaired balance (sitting and/or standing);Pain;Decreased knowledge of use of DME or AE      OT Treatment/Interventions: Self-care/ADL training;Patient/family education;Therapeutic exercise;Balance training;Energy conservation;DME and/or AE instruction    OT Goals(Current goals can be found in the care plan section) Acute Rehab OT Goals Patient Stated Goal: Return home OT Goal Formulation: With patient Time For Goal Achievement: 08/11/20 Potential to Achieve Goals: Good ADL Goals Pt Will Perform Lower Body Bathing: with modified independence;sit to/from stand Pt Will  Perform Lower Body Dressing: with modified independence;sit to/from stand (using AE as needed) Pt Will Transfer to Toilet: with modified independence (using LRAD) Pt Will Perform Toileting - Clothing Manipulation and hygiene: with modified independence;sit to/from stand  OT Frequency: Min 1X/week   Barriers to D/C:            Co-evaluation              AM-PAC OT "6 Clicks" Daily Activity     Outcome Measure Help from another person eating meals?: None Help from another person taking care of personal grooming?: A Little Help from another person toileting, which includes using toliet, bedpan, or urinal?: A Lot Help from another person bathing (including washing, rinsing, drying)?: A Lot Help from another person to put on and taking off regular upper body clothing?: A Little Help from another person to put on and taking off regular lower body clothing?: A Lot 6 Click Score: 16   End of Session Equipment Utilized During Treatment: Rolling walker  Activity Tolerance: Patient limited by pain Patient left: in chair;with call bell/phone within reach;with chair alarm set;with family/visitor present  OT Visit Diagnosis: Unsteadiness on feet (R26.81);Muscle weakness (generalized) (M62.81);Pain Pain - Right/Left: Right Pain - part of body: Hip                Time: 0630-1601 OT Time Calculation (min):  18 min Charges:  OT General Charges $OT Visit: 1 Visit OT Treatments $Self Care/Home Management : 8-22 mins  Josiah Lobo, PhD, MS, OTR/L 07/28/20, 11:17 AM

## 2020-07-28 NOTE — Progress Notes (Signed)
PT Cancellation Note  Patient Details Name: Michele Newton MRN: [id number] DOB: [birth year]   Cancelled Treatment:    Reason Eval/Treat Not Completed: Pain limiting ability to participate  Pt refusal due to pain. Pt educated on importance of attempting HEP packet given if unable to participate in therapy. PT will follow-up per POC and check in at later time as able.   Salem Caster. Fairly IV, PT, DPT Physical Therapist- [facility]  07/28/2020, 2:26 PM

## 2020-07-28 NOTE — TOC Initial Note (Signed)
Transition of Care [facility]) - Initial/Assessment Note    Patient Details  Name: Michele Newton MRN: [id number] Date of Birth: [birth year]  Transition of Care [facility]) CM/SW Contact:    Pete Pelt, RN Phone Number: 07/28/2020, 5:15 PM  Clinical Narrative:     TOC at bedside, spoke with patient about discharge planning.  Recommendation: SNF.  Patient declines SNF at this time, wants to go home with home health.  Discussed with team and recommendation may be changed to home with Home Health.  Awaiting confirmation prior to setting up.  Patient has no concerns about getting to appointments or getting medications at home.  No further concerns about discharge at this time, states she is in pain, care nurse notified by TOC.  Home Health will be set up uponconfirmation of services.                    Patient Goals and CMS Choice        Expected Discharge Plan and Services                                                Prior Living Arrangements/Services                       Activities of Daily Living Home Assistive Devices/Equipment: Cane (specify quad or straight),Walker (specify type),Eyeglasses,Dentures (specify type) ADL Screening (condition at time of admission) Patient's cognitive ability adequate to safely complete daily activities?: Yes Is the patient deaf or have difficulty hearing?: No Does the patient have difficulty seeing, even when wearing glasses/contacts?: No Does the patient have difficulty concentrating, remembering, or making decisions?: No Patient able to express need for assistance with ADLs?: No Does the patient have difficulty dressing or bathing?: No Independently performs ADLs?: Yes (appropriate for developmental age) Does the patient have difficulty walking or climbing stairs?: Yes Weakness of Legs: None Weakness of Arms/Hands: None  Permission Sought/Granted                  Emotional Assessment              Admission  diagnosis:  S/P hip replacement [Z96.649] Patient Active Problem List   Diagnosis Date Noted  . S/P hip replacement 07/27/2020  . IBS (irritable bowel syndrome) 06/20/2020  . Viral infection 04/20/2020  . Right hip pain 03/13/2020  . Class 1 obesity without serious comorbidity with body mass index (BMI) of 33.0 to 33.9 in adult 03/13/2020  . Shortness of breath 02/29/2020  . Psychophysiological insomnia 02/29/2020  . Cough 02/23/2020  . Exacerbation of asthma 02/23/2020  . Herpes zoster 10/26/2019  . Left wrist pain 09/15/2019  . Enrolled in chronic care management 09/15/2019  . Hoarseness of voice 09/15/2019  . Elevated glucose 08/09/2019  . Status post total hip replacement, left 11/19/2018  . Other insomnia 02/06/2018  . Essential hypertension 02/06/2018  . Mild intermittent asthma without complication 27/10/2374  . Acute bursitis of left shoulder 05/20/2017  . Arthritis 04/08/2017  . Chronic midline low back pain without sciatica 04/08/2017  . Generalized osteoarthritis of hand 04/08/2017  . Psoriasis 04/08/2017  . Headache 12/03/2016  . Abdominal pain, left lower quadrant 03/12/2016  . Rhinitis, allergic 12/21/2015  . H/O cold sores 10/01/2015  . Cavus deformity of foot, acquired 08/08/2015  . Plantar fasciitis 08/08/2015  .  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 Right foot pain 07/28/2014  . GERD (gastroesophageal reflux disease) 07/09/2014  . Sleep disorder 07/09/2014  . Back pain 07/27/2013  . Chronic left hip pain 07/27/2013  . Left knee pain 07/27/2013  . Psoriatic arthritis ([place]) 07/27/2013  . Anxiety and depression 06/11/2013  . Right otitis media 04/16/2013  . Sinusitis 02/11/2013  . Weight gain 02/11/2013  . Screening for breast cancer 02/11/2013   PCP:  Verl Bangs, FNP Pharmacy:   [facility] [place], [address] AT [address] [place] [address] Phone: [phone] Fax: [phone]  [facility]  DRUG STORE #[id number] [address], [address] AT [address] [place] [address] Phone: [phone] Fax: [phone]  [place], [address] [place] [address] Phone: [phone] Fax: [phone]     Social Determinants of Health (SDOH) Interventions    Readmission Risk Interventions Readmission Risk Prevention Plan 11/21/2018  Post Dischage Appt Complete  Medication Screening Complete  Transportation Screening Complete  Some recent data might be hidden

## 2020-07-28 NOTE — Discharge Instructions (Signed)

## 2020-07-29 LAB — CBC
HCT: 34.9 % — ABNORMAL LOW (ref 36.0–46.0)
Hemoglobin: 11.6 g/dL — ABNORMAL LOW (ref 12.0–15.0)
MCH: 28.9 pg (ref 26.0–34.0)
MCHC: 33.2 g/dL (ref 30.0–36.0)
MCV: 86.8 fL (ref 80.0–100.0)
Platelets: 302 10*3/uL (ref 150–400)
RBC: 4.02 MIL/uL (ref 3.87–5.11)
RDW: 13.6 % (ref 11.5–15.5)
WBC: 10.4 10*3/uL (ref 4.0–10.5)
nRBC: 0 % (ref 0.0–0.2)

## 2020-07-29 NOTE — Progress Notes (Signed)
   Subjective: 2 Days Post-Op Procedure(s) (LRB): TOTAL HIP ARTHROPLASTY ANTERIOR APPROACH (Right) Patient reports pain as moderate.   Patient is well, and has had no acute complaints or problems Denies any CP, SOB, ABD pain. We will continue therapy today.  Plan is to go Home after hospital stay.  Objective: Vital signs in last 24 hours: Temp:  [97.6 F (36.4 C)-99.7 F (37.6 C)] 97.6 F (36.4 C) (03/26 0426) Pulse Rate:  [85-101] 89 (03/26 0426) Resp:  [16-19] 16 (03/26 0426) BP: (114-159)/(55-126) 117/55 (03/26 0426) SpO2:  [93 %-96 %] 94 % (03/26 0426)  Intake/Output from previous day: 03/25 0701 - 03/26 0700 In: 240 [P.O.:240] Out: 650 [Urine:650] Intake/Output this shift: No intake/output data recorded.  Recent Labs    07/27/20 1636 07/28/20 0527 07/29/20 0430  HGB 13.5 12.3 11.6*   Recent Labs    07/28/20 0527 07/29/20 0430  WBC 10.0 10.4  RBC 4.27 4.02  HCT 37.0 34.9*  PLT 324 302   Recent Labs    07/27/20 1636 07/28/20 0527  NA  --  137  K  --  4.1  CL  --  106  CO2  --  25  BUN  --  13  CREATININE 1.04* 0.89  GLUCOSE  --  122*  CALCIUM  --  8.6*   No results for input(s): LABPT, INR in the last 72 hours.  EXAM General - Patient is Alert, Appropriate and Oriented Extremity - Neurovascular intact Sensation intact distally Intact pulses distally Dorsiflexion/Plantar flexion intact No cellulitis present Compartment soft Dressing - dressing C/D/I and no drainage, prevena intact with out drainage Motor Function - intact, moving foot and toes well on exam.   Past Medical History:  Diagnosis Date  . Anxiety   . Arthritis   . Asthma   . Depression   . Frequent headaches   . GERD (gastroesophageal reflux disease)   . Hydrocephalus ([place]) 1995  . Migraine   . Psoriatic arthritis ([facility])     Assessment/Plan:   2 Days Post-Op Procedure(s) (LRB): TOTAL HIP ARTHROPLASTY ANTERIOR APPROACH (Right) Active Problems:   S/P hip  replacement  Estimated body mass index is 33.44 kg/m as calculated from the following:   Height as of 07/18/20: 5\' 6"  (1.676 m).   Weight as of 07/18/20: 94 kg. Advance diet Up with therapy  Work on [employer] and VSS Pain controlled CM to assist with discharge to home with HHPT.  Plan for Sunday  DVT Prophylaxis - Lovenox, TED hose and SCDs Weight-Bearing as tolerated to right leg   Reche Dixon, PA-C [place] 07/29/2020, 7:12 AM

## 2020-07-29 NOTE — Anesthesia Postprocedure Evaluation (Signed)
Anesthesia Post Note  Patient: Michele Newton  Procedure(s) Performed: TOTAL HIP ARTHROPLASTY ANTERIOR APPROACH (Right Hip)  Patient location during evaluation: Mother Baby Anesthesia Type: Spinal Level of consciousness: oriented and awake and alert Pain management: pain level controlled Vital Signs Assessment: post-procedure vital signs reviewed and stable Respiratory status: spontaneous breathing and respiratory function stable Cardiovascular status: blood pressure returned to baseline and stable Postop Assessment: no headache, no apparent nausea or vomiting and able to ambulate Anesthetic complications: no Comments: Back is a little sore, but not bad.  Good movement of legs, no saddle anesthesia, and good bowel and bladder control.   No complications documented.   Last Vitals:  Vitals:   07/29/20 0735 07/29/20 1459  BP: (!) 146/77 113/60  Pulse: 89 84  Resp: 16 16  Temp: 36.9 C 36.8 C  SpO2: 94% 94%    Last Pain:  Vitals:   07/29/20 1544  TempSrc:   PainSc: 5                  Martha Clan

## 2020-07-29 NOTE — Progress Notes (Signed)
Physical Therapy Treatment Patient Details Name: Michele Newton MRN: [id number] DOB: [birth year] Today's Date: 07/29/2020    History of Present Illness 65 y.o. female s/p right total hip arthroplasty on 07/27/2020. L TKA 2020. PMH of anxiety, depression, hx of LBP.    PT Comments    Pt was certainly able to do more today than on eval but is still very pain limited.  She struggled with any activity that included hip flexion, but with plenty of cuing and encouragement she was able to ambulate into the hallway with labored, limping gait.  Pt making improvement and will have good assist from husband but is still very pain and functionally limited.  Follow Up Recommendations  HHPT, (per progress, pt refusing rehab - is showing improvement but still functionally limited)     Equipment Recommendations       Recommendations for Other Services       Precautions / Restrictions Precautions Precautions: Anterior Hip;Fall Restrictions RLE Weight Bearing: Weight bearing as tolerated    Mobility  Bed Mobility Overal bed mobility: Needs Assistance Bed Mobility: Supine to Sit     Supine to sit: Min assist          Transfers Overall transfer level: Needs assistance Equipment used: Rolling walker (2 wheeled) Transfers: Sit to/from Stand Sit to Stand: Min assist;Min guard         General transfer comment: 2 seperate sit to stand efforts, both labored and slow but able to rise w/o direct assist on 2 attempt, light assist on first  Ambulation/Gait Ambulation/Gait assistance: Min guard Gait Distance (Feet): 40 Feet Assistive device: Rolling walker (2 wheeled)       General Gait Details: Pt starts out very much struggling to lift/advance R LE with each step.  This does improve with increased ambulation, however she was never close to a consistent cadence or smooth/equal step lenght   Stairs             Wheelchair Mobility    Modified Rankin (Stroke Patients Only)        Balance Overall balance assessment: Needs assistance Sitting-balance support: Bilateral upper extremity supported Sitting balance-Leahy Scale: Good     Standing balance support: Bilateral upper extremity supported Standing balance-Leahy Scale: Fair Standing balance comment: heavy reliance on UE support, anxiuos, pain limited in standing                            Cognition Arousal/Alertness: Awake/alert Behavior During Therapy: WFL for tasks assessed/performed Overall Cognitive Status: Within Functional Limits for tasks assessed                                        Exercises Total Joint Exercises Ankle Circles/Pumps: AROM;10 reps Quad Sets: Strengthening;10 reps Short Arc Quad: AROM;AAROM;10 reps Heel Slides: AAROM;10 reps Hip ABduction/ADduction: AROM;10 reps Long Arc Quad: Strengthening;AROM;10 reps Knee Flexion: AROM;10 reps    General Comments        Pertinent Vitals/Pain Pain Assessment: 0-10 Pain Score: 7  Pain Location: anterior R hip    Home Living                      Prior Function            PT Goals (current goals can now be found in the care plan section) Progress towards PT goals: Progressing  toward goals    Frequency    BID      PT Plan Current plan remains appropriate    Co-evaluation              AM-PAC PT "6 Clicks" Mobility   Outcome Measure  Help needed turning from your back to your side while in a flat bed without using bedrails?: A Little Help needed moving from lying on your back to sitting on the side of a flat bed without using bedrails?: A Little Help needed moving to and from a bed to a chair (including a wheelchair)?: A Little Help needed standing up from a chair using your arms (e.g., wheelchair or bedside chair)?: A Little Help needed to walk in hospital room?: A Lot Help needed climbing 3-5 steps with a railing? : Total 6 Click Score: 15    End of Session Equipment  Utilized During Treatment: Gait belt Activity Tolerance: Patient limited by pain Patient left: with bed alarm set;with call bell/phone within reach Nurse Communication: Mobility status PT Visit Diagnosis: Unsteadiness on feet (R26.81);Other abnormalities of gait and mobility (R26.89);Pain;Difficulty in walking, not elsewhere classified (R26.2);Muscle weakness (generalized) (M62.81) Pain - Right/Left: Right Pain - part of body: Hip     Time: 1001-1030 PT Time Calculation (min) (ACUTE ONLY): 29 min  Charges:  $Gait Training: 8-22 mins $Therapeutic Exercise: 8-22 mins                     Kreg Shropshire, DPT 07/29/2020, 1:31 PM

## 2020-07-29 NOTE — Progress Notes (Signed)
Physical Therapy Treatment Patient Details Name: Michele Newton MRN: [id number] DOB: 09-29-1955 Today's Date: 07/29/2020    History of Present Illness 65 y.o. female s/p right total hip arthroplasty on 07/27/2020. L TKA 2020. PMH of anxiety, depression, hx of LBP.    PT Comments    Pt continues to have pain/hypersensitivity in R thigh, but showed good effort and motivation with afternoon session.  She was able to ambulate nearly 100 ft, though she fatigued heavily with the effort (more in UEs than anything, O2 and HR were stable t/o the effort).  She continues to struggle with hip flexion tasks but is showing improved tolerance and control with other hip and LE exercises.  Pt needing some assist getting in/out of bed but is showing improvement with this too.  Pt still limited but showing appropriate POD1 gains.    Follow Up Recommendations  Home health PT     Equipment Recommendations  None recommended by PT    Recommendations for Other Services       Precautions / Restrictions Precautions Precautions: Anterior Hip;Fall Restrictions RLE Weight Bearing: Weight bearing as tolerated    Mobility  Bed Mobility Overal bed mobility: Needs Assistance Bed Mobility: Supine to Sit;Sit to Supine     Supine to sit: Min assist Sit to supine: Min assist   General bed mobility comments: able to use L LE to partially assist raising R back into bed    Transfers Overall transfer level: Needs assistance Equipment used: Rolling walker (2 wheeled) Transfers: Sit to/from Stand Sit to Stand: Min assist         General transfer comment: light assist to attain upright  Ambulation/Gait Ambulation/Gait assistance: Min guard Gait Distance (Feet): 95 Feet Assistive device: Rolling walker (2 wheeled)       General Gait Details: Pt contineus to lack confient step/swing through on the R but did much better than this AM and had reciprocal consistent cadence occasionally for a few steps at a time  this afternoon.  Still very pain limited/guarded/slow but much improved over the last 24 hours.  Reliance on walker and c/o signficant UE fatigue by the end of the effort.   Stairs             Wheelchair Mobility    Modified Rankin (Stroke Patients Only)       Balance Overall balance assessment: Needs assistance Sitting-balance support: Bilateral upper extremity supported Sitting balance-Leahy Scale: Good     Standing balance support: Bilateral upper extremity supported Standing balance-Leahy Scale: Fair Standing balance comment: heavy reliance on UE support, anxious, pain limited in standing                            Cognition Arousal/Alertness: Awake/alert Behavior During Therapy: WFL for tasks assessed/performed Overall Cognitive Status: Within Functional Limits for tasks assessed                                        Exercises Total Joint Exercises Ankle Circles/Pumps: AROM;10 reps Quad Sets: Strengthening;10 reps Short Arc Quad: AROM;10 reps Heel Slides: AAROM;10 reps (AAROM pulling back, resisted leg extensions) Hip ABduction/ADduction: AROM;AAROM;10 reps Long Arc Quad: Strengthening;AROM;10 reps Knee Flexion: AROM;10 reps    General Comments        Pertinent Vitals/Pain Pain Assessment: 0-10 Pain Score: 5  Pain Location: anterior R hip  Home Living                      Prior Function            PT Goals (current goals can now be found in the care plan section) Progress towards PT goals: Progressing toward goals    Frequency    BID      PT Plan Current plan remains appropriate    Co-evaluation              AM-PAC PT "6 Clicks" Mobility   Outcome Measure  Help needed turning from your back to your side while in a flat bed without using bedrails?: A Little Help needed moving from lying on your back to sitting on the side of a flat bed without using bedrails?: A Little Help needed moving  to and from a bed to a chair (including a wheelchair)?: A Little Help needed standing up from a chair using your arms (e.g., wheelchair or bedside chair)?: A Little Help needed to walk in hospital room?: A Little Help needed climbing 3-5 steps with a railing? : Total 6 Click Score: 16    End of Session Equipment Utilized During Treatment: Gait belt Activity Tolerance: Patient limited by pain Patient left: with bed alarm set;with call bell/phone within reach Nurse Communication: Mobility status PT Visit Diagnosis: Unsteadiness on feet (R26.81);Other abnormalities of gait and mobility (R26.89);Pain;Difficulty in walking, not elsewhere classified (R26.2);Muscle weakness (generalized) (M62.81) Pain - Right/Left: Right Pain - part of body: Hip     Time: 0932-3557 PT Time Calculation (min) (ACUTE ONLY): 28 min  Charges:  $Gait Training: 8-22 mins $Therapeutic Exercise: 8-22 mins                     Kreg Shropshire, DPT 07/29/2020, 4:42 PM

## 2020-07-30 NOTE — Progress Notes (Signed)
   Subjective: 3 Days Post-Op Procedure(s) (LRB): TOTAL HIP ARTHROPLASTY ANTERIOR APPROACH (Right) Patient reports pain as mild to moderate.   Patient is well, and has had no acute complaints or problems Denies any CP, SOB, ABD pain. We will continue therapy today.  Plan is to go Home after hospital stay.  Objective: Vital signs in last 24 hours: Temp:  [97.6 F (36.4 C)-98.7 F (37.1 C)] 97.6 F (36.4 C) (03/27 0449) Pulse Rate:  [78-89] 82 (03/27 0449) Resp:  [15-18] 18 (03/27 0449) BP: (109-146)/(60-77) 123/76 (03/27 0449) SpO2:  [91 %-98 %] 91 % (03/27 0449)  Intake/Output from previous day: 03/26 0701 - 03/27 0700 In: 480 [P.O.:480] Out: 0  Intake/Output this shift: No intake/output data recorded.  Recent Labs    07/27/20 1636 07/28/20 0527 07/29/20 0430  HGB 13.5 12.3 11.6*   Recent Labs    07/28/20 0527 07/29/20 0430  WBC 10.0 10.4  RBC 4.27 4.02  HCT 37.0 34.9*  PLT 324 302   Recent Labs    07/27/20 1636 07/28/20 0527  NA  --  137  K  --  4.1  CL  --  106  CO2  --  25  BUN  --  13  CREATININE 1.04* 0.89  GLUCOSE  --  122*  CALCIUM  --  8.6*   No results for input(s): LABPT, INR in the last 72 hours.  EXAM General - Patient is Alert, Appropriate and Oriented Extremity - Neurovascular intact Sensation intact distally Intact pulses distally Dorsiflexion/Plantar flexion intact No cellulitis present Compartment soft Dressing - dressing C/D/I and no drainage, prevena intact with out drainage Motor Function - intact, moving foot and toes well on exam.  Ambulated 100 feet with physical therapy  Past Medical History:  Diagnosis Date  . Anxiety   . Arthritis   . Asthma   . Depression   . Frequent headaches   . GERD (gastroesophageal reflux disease)   . Hydrocephalus ([place]) 1995  . Migraine   . Psoriatic arthritis ([facility])     Assessment/Plan:   3 Days Post-Op Procedure(s) (LRB): TOTAL HIP ARTHROPLASTY ANTERIOR APPROACH (Right) Active  Problems:   S/P hip replacement  Estimated body mass index is 33.44 kg/m as calculated from the following:   Height as of 07/18/20: 5\' 6"  (1.676 m).   Weight as of 07/18/20: 94 kg. Advance diet Up with therapy  Work on [employer] and VSS Pain controlled CM to assist with discharge to home with HHPT.  Plan for today  DVT Prophylaxis - Lovenox, TED hose and SCDs Weight-Bearing as tolerated to right leg   Reche Dixon, PA-C [place] 07/30/2020, 7:21 AM

## 2020-07-30 NOTE — Progress Notes (Signed)
Physical Therapy Treatment Patient Details Name: Michele Newton MRN: [id number] DOB: [birth year] Today's Date: 07/30/2020    History of Present Illness 65 y.o. female s/p right total hip arthroplasty on 07/27/2020. L TKA 2020. PMH of anxiety, depression, hx of LBP.    PT Comments    Pt continues to have expected pain and show hypersensitivity with any (even very light) palpation of the R thigh.  However she was able to ambulate with more consistent cadence today than any prior attempt.  Multiple trials of steps with inconsistent performance.  She did struggle to trust R LE t/o much of the effort and needed constant cuing and close guarding to insure appropriate negotiation/UE use/strategy.  She did have one bout of R knee buckling that she improved with cuing from PT about positioning (U&LEs) and deliberately engaging R quad during [place].  Pt has made slow but consistent gains, husband will be home 24/7 to assist.    Follow Up Recommendations  Home health PT     Equipment Recommendations  Rolling walker with 5" wheels    Recommendations for Other Services       Precautions / Restrictions Precautions Precautions: Anterior Hip;Fall Restrictions Weight Bearing Restrictions: Yes RLE Weight Bearing: Weight bearing as tolerated    Mobility  Bed Mobility Overal bed mobility: Modified Independent Bed Mobility: Supine to Sit     Supine to sit: Min guard     General bed mobility comments: able to use L LE to partially assist R LE off bed    Transfers Overall transfer level: Modified independent Equipment used: Rolling walker (2 wheeled) Transfers: Sit to/from Stand Sit to Stand: Supervision         General transfer comment: Cues for UE use, multiple sit to stand w/o direct assist t/o session  Ambulation/Gait Ambulation/Gait assistance: Min guard Gait Distance (Feet): 60 Feet Assistive device: Rolling walker (2 wheeled)       General Gait Details: Pt with slow but safe  gait.  Continues to be hesitant with WBing and UEs do fatigue with the effort.  Pt did not struggled to advance R LE today as on some previous ambulation trials.   Stairs Stairs: Yes Stairs assistance: Min guard;Min assist Stair Management: One rail Right;Sideways Number of Stairs: 8 General stair comments: up/down 4 steps x 2, pt did struggle trusting R LE and had a little bit of buckling even with heavy cuing for UE use/placement and repeated sequencing cues.  Pt ultimately was able to negotiate w/o assist on second bout.   Wheelchair Mobility    Modified Rankin (Stroke Patients Only)       Balance Overall balance assessment: Modified Independent                                          Cognition Arousal/Alertness: Awake/alert Behavior During Therapy: WFL for tasks assessed/performed Overall Cognitive Status: Within Functional Limits for tasks assessed                                        Exercises Total Joint Exercises Ankle Circles/Pumps: AROM;10 reps Quad Sets: Strengthening;15 reps Short Arc Quad: Strengthening;15 reps Heel Slides: AAROM;10 reps Hip ABduction/ADduction: AROM;Strengthening;10 reps    General Comments        Pertinent Vitals/Pain Pain Assessment: 0-10 Pain Score:  5  Pain Location: anterior R hip    Home Living                      Prior Function            PT Goals (current goals can now be found in the care plan section) Progress towards PT goals: Progressing toward goals    Frequency    BID      PT Plan Current plan remains appropriate    Co-evaluation              AM-PAC PT "6 Clicks" Mobility   Outcome Measure  Help needed turning from your back to your side while in a flat bed without using bedrails?: None Help needed moving from lying on your back to sitting on the side of a flat bed without using bedrails?: None Help needed moving to and from a bed to a chair  (including a wheelchair)?: None Help needed standing up from a chair using your arms (e.g., wheelchair or bedside chair)?: None Help needed to walk in hospital room?: A Little Help needed climbing 3-5 steps with a railing? : A Lot 6 Click Score: 21    End of Session Equipment Utilized During Treatment: Gait belt Activity Tolerance: Patient limited by pain Patient left: with bed alarm set;with call bell/phone within reach Nurse Communication: Mobility status PT Visit Diagnosis: Unsteadiness on feet (R26.81);Other abnormalities of gait and mobility (R26.89);Pain;Difficulty in walking, not elsewhere classified (R26.2);Muscle weakness (generalized) (M62.81) Pain - Right/Left: Right Pain - part of body: Hip     Time: 0927-1006 PT Time Calculation (min) (ACUTE ONLY): 39 min  Charges:  $Gait Training: 23-37 mins $Therapeutic Exercise: 8-22 mins                     Kreg Shropshire, DPT 07/30/2020, 12:45 PM

## 2020-07-31 DIAGNOSIS — M47816 Spondylosis without myelopathy or radiculopathy, lumbar region: Secondary | ICD-10-CM | POA: Diagnosis not present

## 2020-07-31 DIAGNOSIS — L4050 Arthropathic psoriasis, unspecified: Secondary | ICD-10-CM | POA: Diagnosis not present

## 2020-07-31 DIAGNOSIS — Z7951 Long term (current) use of inhaled steroids: Secondary | ICD-10-CM | POA: Diagnosis not present

## 2020-07-31 DIAGNOSIS — F32A Depression, unspecified: Secondary | ICD-10-CM | POA: Diagnosis not present

## 2020-07-31 DIAGNOSIS — Z471 Aftercare following joint replacement surgery: Secondary | ICD-10-CM | POA: Diagnosis not present

## 2020-07-31 DIAGNOSIS — G8929 Other chronic pain: Secondary | ICD-10-CM | POA: Diagnosis not present

## 2020-07-31 DIAGNOSIS — M48061 Spinal stenosis, lumbar region without neurogenic claudication: Secondary | ICD-10-CM | POA: Diagnosis not present

## 2020-07-31 DIAGNOSIS — Z96641 Presence of right artificial hip joint: Secondary | ICD-10-CM | POA: Diagnosis not present

## 2020-07-31 DIAGNOSIS — K219 Gastro-esophageal reflux disease without esophagitis: Secondary | ICD-10-CM | POA: Diagnosis not present

## 2020-07-31 DIAGNOSIS — F411 Generalized anxiety disorder: Secondary | ICD-10-CM | POA: Diagnosis not present

## 2020-07-31 DIAGNOSIS — I10 Essential (primary) hypertension: Secondary | ICD-10-CM | POA: Diagnosis not present

## 2020-07-31 LAB — SURGICAL PATHOLOGY

## 2020-08-02 ENCOUNTER — Telehealth (INDEPENDENT_AMBULATORY_CARE_PROVIDER_SITE_OTHER): Payer: Medicare Other | Admitting: Family Medicine

## 2020-08-02 ENCOUNTER — Encounter: Payer: Self-pay | Admitting: Unknown Physician Specialty

## 2020-08-02 ENCOUNTER — Encounter: Payer: Self-pay | Admitting: Family Medicine

## 2020-08-02 ENCOUNTER — Other Ambulatory Visit: Payer: Self-pay

## 2020-08-02 VITALS — BP 125/70 | Temp 99.7°F

## 2020-08-02 DIAGNOSIS — N3001 Acute cystitis with hematuria: Secondary | ICD-10-CM

## 2020-08-02 MED ORDER — CEPHALEXIN 500 MG PO CAPS: 500.0000 mg | ORAL_CAPSULE | Freq: Three times a day (TID) | ORAL | 0 refills | Status: DC

## 2020-08-02 NOTE — Telephone Encounter (Signed)
The pt called complaining of some confusion that she associated with post anesthesia, dark yellow urine, foul odor and mild dysuria x 3 days. I ask the patient if she is drinking plenty of fluid because a dark color urine normally indicate that she is dehydrated. She said she drink a gallon or more daily. She had  hip  surgery x 6 days ago and currently undergoing home  PT 3 x weekly.She  had a catheter in at the hospital. She is requesting that you prescribe a antibiotic?

## 2020-08-02 NOTE — Patient Instructions (Addendum)
Take keflex antibiotic three times a day for 7 days If unresolved we can refer to urologist  Please schedule a Follow-up Appointment to: Return in about 1 week (around 08/09/2020), or if symptoms worsen or fail to improve, for UTI.  If you have any other questions or concerns, please feel free to call the office or send a message through [facility]. You may also schedule an earlier appointment if necessary.  Additionally, you may be receiving a survey about your experience at our office within a few days to 1 week by e-mail or mail. We value your feedback.  Nobie Putnam, DO [place]

## 2020-08-02 NOTE — Progress Notes (Signed)
Virtual Visit via Telephone The purpose of this virtual visit is to provide medical care while limiting exposure to the novel coronavirus (COVID19) for both patient and office staff.  Consent was obtained for phone visit:  Yes.   Answered questions that patient had about telehealth interaction:  Yes.   I discussed the limitations, risks, security and privacy concerns of performing an evaluation and management service by telephone. I also discussed with the patient that there may be a patient responsible charge related to this service. The patient expressed understanding and agreed to proceed.  Patient Location: Home Provider Location: [facility] (Office)  Participants in virtual visit: - Patient: Michele Newton - CMA: Orinda Kenner, Ali Chuk - Provider: Dr Parks Ranger  ---------------------------------------------------------------------- Chief Complaint  Patient presents with  . Dysuria    C/o confusion that she associated with post anesthesia,urinary incontinence, low grade fever 99.7 ,dark yellow urine, foul odor and mild dysuria x 6 days. Constant irritation since removing the catheter. She said she drink a gallon or more daily. TOTAL HIP ARTHROPLASTY Right Hip surgery x 6 days.        S: Reviewed CMA documentation. I have called patient and gathered additional HPI as follows:  Post-op UTI Recent hip replacement surgery Reports that symptoms started after urinary catheter post-op, she had dysuria with catheter, since removal she has had some darker urine, foul odor, dysuria, now some improvement but still mild symptoms Admits some mild urinary incontinence associated with urgency. Otherwise she can control her voiding  Denies any fevers, chills, sweats, body ache, cough, shortness of breath, sinus pain or pressure, headache, abdominal pain, diarrhea  Past Medical History:  Diagnosis Date  . Anxiety   . Arthritis   . Asthma   . Depression   . Frequent headaches    . GERD (gastroesophageal reflux disease)   . Hydrocephalus ([place]) 1995  . Migraine   . Psoriatic arthritis ([place])    Social History   Tobacco Use  . Smoking status: Never Smoker  . Smokeless tobacco: Never Used  Vaping Use  . Vaping Use: Never used  Substance Use Topics  . Alcohol use: No  . Drug use: No    Current Outpatient Medications:  .  albuterol (VENTOLIN HFA) 108 (90 Base) MCG/ACT inhaler, INHALE 1 TO 2 PUFFS INTO THE LUNGS EVERY 6 HOURS AS NEEDED FOR WHEEZING OR SHORTNESS OF BREATH (Patient taking differently: Inhale 1-2 puffs into the lungs every 6 (six) hours as needed for wheezing or shortness of breath.), Disp: 6.7 g, Rfl: 1 .  budesonide-formoterol (SYMBICORT) 160-4.5 MCG/ACT inhaler, Inhale 2 puffs into the lungs 2 (two) times daily., Disp: 1 each, Rfl: 3 .  cephALEXin (KEFLEX) 500 MG capsule, Take 1 capsule (500 mg total) by mouth 3 (three) times daily. For 7 days, Disp: 21 capsule, Rfl: 0 .  docusate sodium (COLACE) 100 MG capsule, Take 1 capsule (100 mg total) by mouth 2 (two) times daily., Disp: 10 capsule, Rfl: 0 .  enoxaparin (LOVENOX) 40 MG/0.4ML injection, Inject 0.4 mLs (40 mg total) into the skin daily for 14 days., Disp: 5.6 mL, Rfl: 0 .  fluticasone (FLONASE) 50 MCG/ACT nasal spray, Place 2 sprays into both nostrils daily. Use for 4-6 weeks then stop and use seasonally or as needed., Disp: 16 g, Rfl: 3 .  methocarbamol (ROBAXIN) 500 MG tablet, Take 1 tablet (500 mg total) by mouth every 6 (six) hours as needed for muscle spasms., Disp: 30 tablet, Rfl: 0 .  montelukast (  SINGULAIR) 10 MG tablet, Take 1 tablet (10 mg total) by mouth at bedtime., Disp: 90 tablet, Rfl: 1 .  oxyCODONE (OXY IR/ROXICODONE) 5 MG immediate release tablet, Take 1-2 tablets (5-10 mg total) by mouth every 4 (four) hours as needed for moderate pain., Disp: 30 tablet, Rfl: 0 .  pantoprazole (PROTONIX) 40 MG tablet, TAKE 1 TABLET(40 MG) BY MOUTH TWICE DAILY (Patient taking differently: Take 40  mg by mouth 2 (two) times daily.), Disp: 180 tablet, Rfl: 0 .  PARoxetine (PAXIL) 40 MG tablet, Take 1 tablet (40 mg total) by mouth at bedtime., Disp: 90 tablet, Rfl: 0 .  QUEtiapine (SEROQUEL) 200 MG tablet, Take 1 tablet (200 mg total) by mouth at bedtime., Disp: 90 tablet, Rfl: 1 .  traMADol (ULTRAM) 50 MG tablet, Take 1 tablet (50 mg total) by mouth every 6 (six) hours., Disp: 30 tablet, Rfl: 0 .  traZODone (DESYREL) 50 MG tablet, Take 0.5-1 tablets (25-50 mg total) by mouth at bedtime as needed for sleep. (Patient taking differently: Take 50 mg by mouth at bedtime.), Disp: 30 tablet, Rfl: 3 .  verapamil (CALAN) 120 MG tablet, Take 1 tablet (120 mg total) by mouth daily., Disp: 90 tablet, Rfl: 1 .  ARIPiprazole (ABILIFY) 2 MG tablet, Take 1 tablet (2 mg total) by mouth daily. (Patient not taking: Reported on 08/02/2020), Disp: 90 tablet, Rfl: 1 .  lubiprostone (AMITIZA) 24 MCG capsule, Take 1 capsule (24 mcg total) by mouth 2 (two) times daily with a meal. (Patient not taking: Reported on 08/02/2020), Disp: 60 capsule, Rfl: 2 .  Naltrexone-buPROPion HCl ER 8-90 MG TB12, Start 1 tablet every morning for 7 days, then 1 tablet twice daily for 7 days, then 2 tablets every morning and one in the evening, then 2 tablets twice daily (Patient not taking: No sig reported), Disp: 120 tablet, Rfl: 0  Depression screen [facility] 2/9 03/13/2020 09/15/2019 03/25/2019  Decreased Interest 0 0 0  Down, Depressed, Hopeless 0 0 0  PHQ - 2 Score 0 0 0  Altered sleeping 1 0 -  Tired, decreased energy 0 0 -  Change in appetite 0 0 -  Feeling bad or failure about yourself  0 0 -  Trouble concentrating 0 0 -  Moving slowly or fidgety/restless 0 0 -  Suicidal thoughts 0 0 -  PHQ-9 Score 1 0 -  Difficult doing work/chores Not difficult at all - -  Some recent data might be hidden    GAD 7 : Generalized Anxiety Score 03/13/2020 11/16/2018 05/12/2018 10/01/2015  Nervous, Anxious, on Edge 0 2 3 3   Control/stop worrying 0 1 3 2    Worry too much - different things 0 1 1 2   Trouble relaxing 0 1 3 2   Restless 0 0 0 0  Easily annoyed or irritable 1 2 3 3   Afraid - awful might happen 0 1 0 1  Total GAD 7 Score 1 8 13 13   Anxiety Difficulty Not difficult at all - Not difficult at all Somewhat difficult    -------------------------------------------------------------------------- O: No physical exam performed due to remote telephone encounter.  Lab results reviewed.  Recent Results (from the past 2160 hour(s))  Surgical pcr screen     Status: None   Collection Time: 07/18/20 10:54 AM   Specimen: Nasal Mucosa; Nasal Swab  Result Value Ref Range   MRSA, PCR NEGATIVE NEGATIVE   Staphylococcus aureus NEGATIVE NEGATIVE    Comment: (NOTE) The Xpert SA Assay (FDA approved for NASAL specimens in  patients 34 years of age and older), is one component of a comprehensive surveillance program. It is not intended to diagnose infection nor to guide or monitor treatment. Performed at [facility], [address] [place], [place] [address]   CBC WITH DIFFERENTIAL     Status: Abnormal   Collection Time: 07/18/20 10:54 AM  Result Value Ref Range   WBC 7.9 4.0 - 10.5 K/uL   RBC 5.15 (H) 3.87 - 5.11 MIL/uL   Hemoglobin 14.8 12.0 - 15.0 g/dL   HCT 43.8 36.0 - 46.0 %   MCV 85.0 80.0 - 100.0 fL   MCH 28.7 26.0 - 34.0 pg   MCHC 33.8 30.0 - 36.0 g/dL   RDW 13.2 11.5 - 15.5 %   Platelets 420 (H) 150 - 400 K/uL   nRBC 0.0 0.0 - 0.2 %   Neutrophils Relative % 58 %   Neutro Abs 4.6 1.7 - 7.7 K/uL   Lymphocytes Relative 29 %   Lymphs Abs 2.2 0.7 - 4.0 K/uL   Monocytes Relative 10 %   Monocytes Absolute 0.8 0.1 - 1.0 K/uL   Eosinophils Relative 2 %   Eosinophils Absolute 0.1 0.0 - 0.5 K/uL   Basophils Relative 1 %   Basophils Absolute 0.1 0.0 - 0.1 K/uL   Immature Granulocytes 0 %   Abs Immature Granulocytes 0.03 0.00 - 0.07 K/uL    Comment: Performed at [facility], [address] [place], [place]  [address]  Comprehensive metabolic panel     Status: None   Collection Time: 07/18/20 10:54 AM  Result Value Ref Range   Sodium 138 135 - 145 mmol/L   Potassium 3.9 3.5 - 5.1 mmol/L   Chloride 107 98 - 111 mmol/L   CO2 23 22 - 32 mmol/L   Glucose, Bld 95 70 - 99 mg/dL    Comment: Glucose reference range applies only to samples taken after fasting for at least 8 hours.   BUN 17 8 - 23 mg/dL   Creatinine, Ser 0.88 0.44 - 1.00 mg/dL   Calcium 9.2 8.9 - 10.3 mg/dL   Total Protein 7.3 6.5 - 8.1 g/dL   Albumin 4.1 3.5 - 5.0 g/dL   AST 24 15 - 41 U/L   ALT 32 0 - 44 U/L   Alkaline Phosphatase 81 38 - 126 U/L   Total Bilirubin 0.6 0.3 - 1.2 mg/dL   GFR, Estimated >60 >60 mL/min    Comment: (NOTE) Calculated using the CKD-EPI Creatinine Equation (2021)    Anion gap 8 5 - 15    Comment: Performed at [facility], [address], [place], [place] [address]  Type and screen Order type and screen if day of surgery is less than 15 days from draw of preadmission visit or order morning of surgery if day of surgery is greater than 6 days from preadmission visit.     Status: None   Collection Time: 07/18/20 10:54 AM  Result Value Ref Range   ABO/RH(D) O POS    Antibody Screen NEG    Sample Expiration 08/01/2020,2359    Extend sample reason      NO TRANSFUSIONS OR PREGNANCY IN THE PAST 3 MONTHS Performed at [facility], [address] [place], [place] [address]   SARS CORONAVIRUS 2 (TAT 6-24 HRS) Nasopharyngeal Nasopharyngeal Swab     Status: None   Collection Time: 07/25/20 10:43 AM   Specimen: Nasopharyngeal Swab  Result Value Ref Range   SARS Coronavirus  2 NEGATIVE NEGATIVE    Comment: (NOTE) SARS-CoV-2 target nucleic acids are NOT DETECTED.  The SARS-CoV-2 RNA is generally detectable in upper and lower respiratory specimens during the acute phase of infection. Negative results do not preclude SARS-CoV-2 infection, do not rule out co-infections with other pathogens,  and should not be used as the sole basis for treatment or other patient management decisions. Negative results must be combined with clinical observations, patient history, and epidemiological information. The expected result is Negative.  Fact Sheet for Patients: SugarRoll.be  Fact Sheet for Healthcare Providers: https://www.woods-mathews.com/  This test is not yet approved or cleared by the [place] FDA and  has been authorized for detection and/or diagnosis of SARS-CoV-2 by FDA under an Emergency Use Authorization (EUA). This EUA will remain  in effect (meaning this test can be used) for the duration of the COVID-19 declaration under Se ction 564(b)(1) of the Act, 21 U.S.C. section 360bbb-3(b)(1), unless the authorization is terminated or revoked sooner.  Performed at [facility], [place] [address] [place], [place] [address]   Urinalysis, Routine w reflex microscopic     Status: Abnormal   Collection Time: 07/25/20 10:43 AM  Result Value Ref Range   Color, Urine YELLOW (A) YELLOW   APPearance HAZY (A) CLEAR   Specific Gravity, Urine 1.018 1.005 - 1.030   pH 5.0 5.0 - 8.0   Glucose, UA NEGATIVE NEGATIVE mg/dL   Hgb urine dipstick NEGATIVE NEGATIVE   Bilirubin Urine NEGATIVE NEGATIVE   Ketones, ur NEGATIVE NEGATIVE mg/dL   Protein, ur NEGATIVE NEGATIVE mg/dL   Nitrite NEGATIVE NEGATIVE   Leukocytes,Ua NEGATIVE NEGATIVE    Comment: Performed at [facility], [address] [place], [place] [address]  Surgical pathology     Status: None   Collection Time: 07/27/20  1:22 PM  Result Value Ref Range   SURGICAL PATHOLOGY      SURGICAL PATHOLOGY CASE: [id number] PATIENT: [facility] Surgical Pathology Report     Specimen Submitted: A. Femoral head, right  Clinical History: Primary localized osteoarthritis of right hip M16.11. Arthritis of hip M16.10      DIAGNOSIS: A. FEMORAL HEAD, RIGHT;  ARTHROPLASTY: - DEGENERATIVE OSTEOARTHROPATHY. - NEGATIVE FOR MALIGNANCY.   GROSS DESCRIPTION: A. Labeled: Right femoral head Received: Formalin Collection time: 2:26 PM on 07/27/2020 Placed into formalin time: 2:26 PM on 07/27/2020 Size of specimen:      Head -4.5 x 4.4 x 3.7 cm      Neck -3.8 x 3.4 x 2.5 cm      Additional tissue: None grossly appreciated. Articular surface: The articular surface has tan and smooth cartilage with a 5.2 x 5 cm area of roughening. Cut surface: The cut surface is tan-yellow and firm. Other findings: None grossly appreciated.  Block summary: 1 - representative sections of roughened cartilage to adjacent cartilage, following decalcification  Tissue de calcification: Yes, 1 cassette  RB 07/28/2020  Final Diagnosis performed by Betsy Pries, MD.   Electronically signed 07/31/2020 8:52:10AM The electronic signature indicates that the named Attending Pathologist has evaluated the specimen Technical component performed at [facility], [address], [place], [place] [address] Lab: [phone] Dir: Rush Farmer, MD, MMM  Professional component performed at [facility], [facility], [place], [place],  [address] Lab: [phone] Dir: Dellia Nims. Rubinas, MD   CBC     Status: Abnormal   Collection Time: 07/27/20  4:36 PM  Result Value Ref Range   WBC 15.2 (H) 4.0 - 10.5  K/uL   RBC 4.69 3.87 - 5.11 MIL/uL   Hemoglobin 13.5 12.0 - 15.0 g/dL   HCT 41.1 36.0 - 46.0 %   MCV 87.6 80.0 - 100.0 fL   MCH 28.8 26.0 - 34.0 pg   MCHC 32.8 30.0 - 36.0 g/dL   RDW 13.5 11.5 - 15.5 %   Platelets 373 150 - 400 K/uL   nRBC 0.0 0.0 - 0.2 %    Comment: Performed at [facility], [address] [place], [place] [address]  Creatinine, serum     Status: Abnormal   Collection Time: 07/27/20  4:36 PM  Result Value Ref Range   Creatinine, Ser 1.04 (H) 0.44 - 1.00 mg/dL   GFR, Estimated 60 (L) >60 mL/min    Comment:  (NOTE) Calculated using the CKD-EPI Creatinine Equation (2021) Performed at [facility], [address] [place], [place] [address]   CBC     Status: None   Collection Time: 07/28/20  5:27 AM  Result Value Ref Range   WBC 10.0 4.0 - 10.5 K/uL   RBC 4.27 3.87 - 5.11 MIL/uL   Hemoglobin 12.3 12.0 - 15.0 g/dL   HCT 37.0 36.0 - 46.0 %   MCV 86.7 80.0 - 100.0 fL   MCH 28.8 26.0 - 34.0 pg   MCHC 33.2 30.0 - 36.0 g/dL   RDW 13.5 11.5 - 15.5 %   Platelets 324 150 - 400 K/uL   nRBC 0.0 0.0 - 0.2 %    Comment: Performed at [facility], [address], [place], [place] [address]  Basic metabolic panel     Status: Abnormal   Collection Time: 07/28/20  5:27 AM  Result Value Ref Range   Sodium 137 135 - 145 mmol/L   Potassium 4.1 3.5 - 5.1 mmol/L   Chloride 106 98 - 111 mmol/L   CO2 25 22 - 32 mmol/L   Glucose, Bld 122 (H) 70 - 99 mg/dL    Comment: Glucose reference range applies only to samples taken after fasting for at least 8 hours.   BUN 13 8 - 23 mg/dL   Creatinine, Ser 0.89 0.44 - 1.00 mg/dL   Calcium 8.6 (L) 8.9 - 10.3 mg/dL   GFR, Estimated >60 >60 mL/min    Comment: (NOTE) Calculated using the CKD-EPI Creatinine Equation (2021)    Anion gap 6 5 - 15    Comment: Performed at [facility], [address] [place], [place] [address]  CBC     Status: Abnormal   Collection Time: 07/29/20  4:30 AM  Result Value Ref Range   WBC 10.4 4.0 - 10.5 K/uL   RBC 4.02 3.87 - 5.11 MIL/uL   Hemoglobin 11.6 (L) 12.0 - 15.0 g/dL   HCT 34.9 (L) 36.0 - 46.0 %   MCV 86.8 80.0 - 100.0 fL   MCH 28.9 26.0 - 34.0 pg   MCHC 33.2 30.0 - 36.0 g/dL   RDW 13.6 11.5 - 15.5 %   Platelets 302 150 - 400 K/uL   nRBC 0.0 0.0 - 0.2 %    Comment: Performed at [facility], [address], [place],  [address]    -------------------------------------------------------------------------- A&P:  Problem List Items Addressed This Visit   None   Visit  Diagnoses    Acute cystitis with hematuria    -  Primary   Relevant Medications   cephALEXin (KEFLEX) 500 MG capsule     Clinically consistent with UTI by history post-op  scenario was on urinary catheter now it is out Virtual visit unable to get urine sample, she is at home w/ PT for hip replacement No urine culture available No concern for pyelo today (no systemic symptoms, neg fever, back pain, n/v).  Question if catheter post op caused issue or triggered  Keflex 500mg  TID x 7 days Improve PO hydration RTC if no improvement 1-2 weeks, red flags given to return sooner  Can refer to urology if indicated.   Meds ordered this encounter  Medications  . cephALEXin (KEFLEX) 500 MG capsule    Sig: Take 1 capsule (500 mg total) by mouth 3 (three) times daily. For 7 days    Dispense:  21 capsule    Refill:  0    Follow-up: - Return PRN 1 week if not resolved  Patient verbalizes understanding with the above medical recommendations including the limitation of remote medical advice.  Specific follow-up and call-back criteria were given for patient to follow-up or seek medical care more urgently if needed.   - Time spent in direct consultation with patient on phone: 7 minutes   Nobie Putnam, [facility] 08/02/2020, 2:46 PM

## 2020-08-06 IMAGING — MR MR LUMBAR SPINE W/O CM
5 series · 31 of 48 positions shown · non-contrast
Comparison: None.

CLINICAL DATA: Low back pain.

EXAM:
MRI LUMBAR SPINE WITHOUT CONTRAST
TECHNIQUE: Multiplanar, multisequence MR imaging of the lumbar spine was
performed. No intravenous contrast was administered.

[Series 5: T2 · sagittal · 4.0mm · 0.81mm/px · 6 of 17 slices shown (1 of 2)]
[im 1/17]
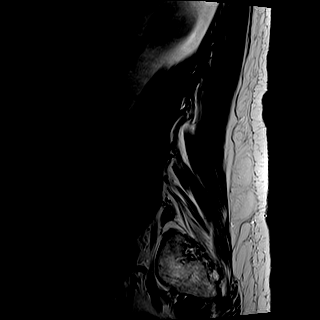
[im 4/17]
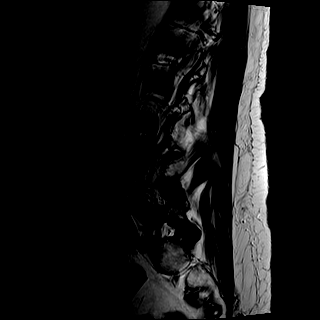
[im 7/17]
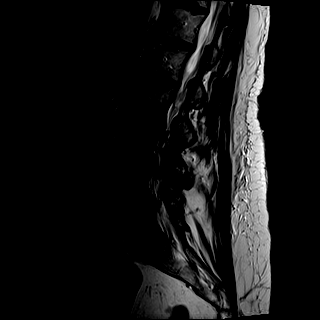
[im 10/17]
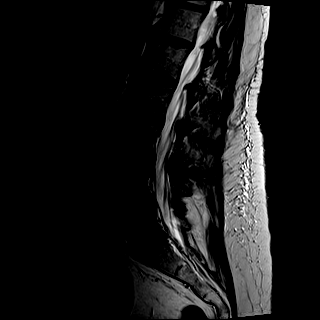
[im 13/17]
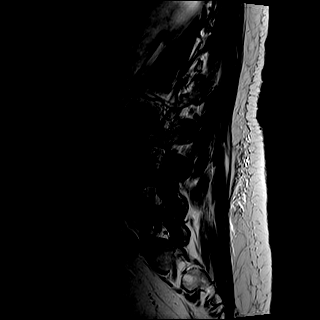
[im 17/17]
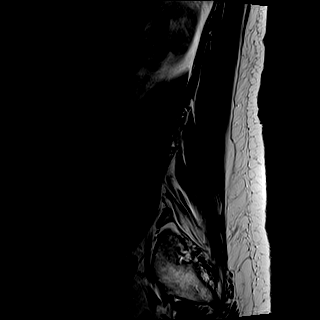

[Series 6: T1 · sagittal · 4.0mm · 0.81mm/px · 7 of 17 slices shown (1 of 2)]
[im 1/17]
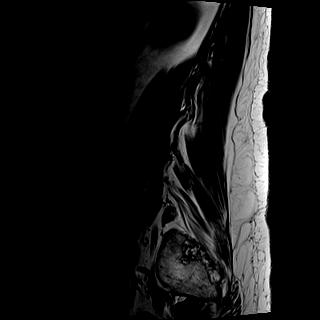
[im 3/17]
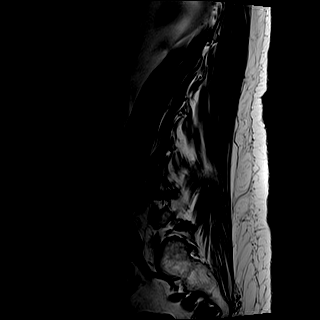
[im 6/17]
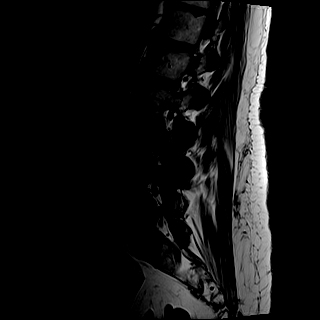
[im 9/17]
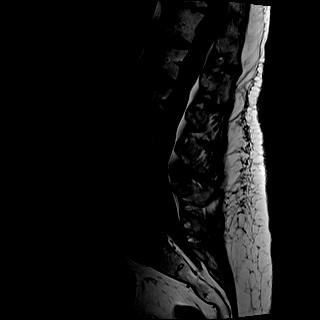
[im 11/17]
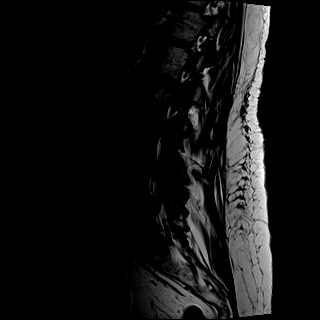
[im 14/17]
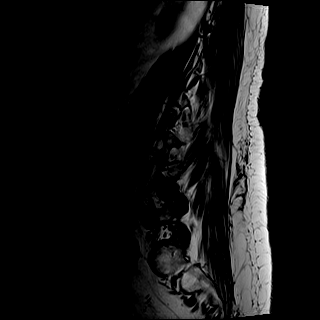
[im 17/17]
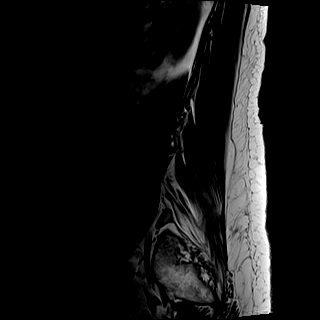

[Series 7: STIR · sagittal · 4.0mm · 0.41mm/px · 2 of 17 slices shown]
[im 1/17]
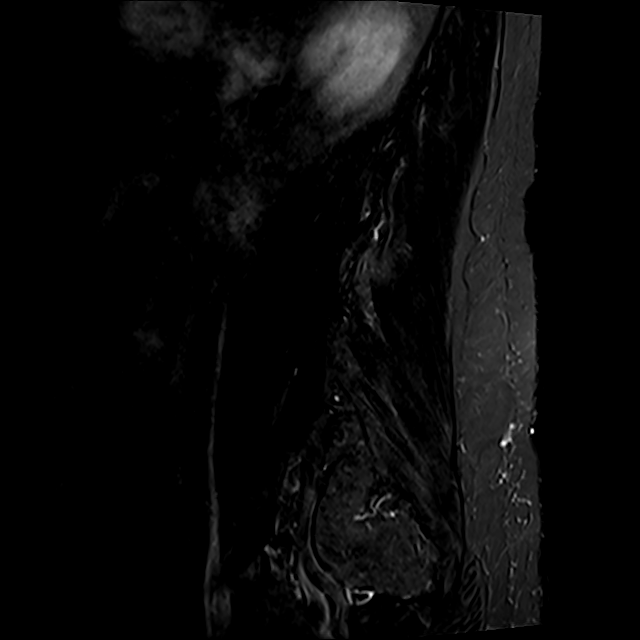
[im 3/17]
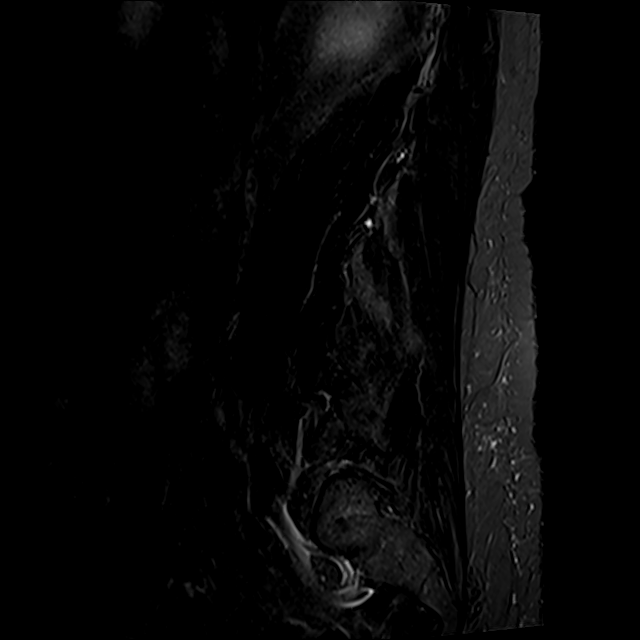

[Series 8: T2 · axial · 4.0mm · 0.78mm/px · z∈[-89,+119]mm · 8 of 36 slices shown (2 of 2)]
[im 1/36]
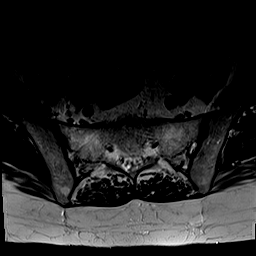
[im 6/36]
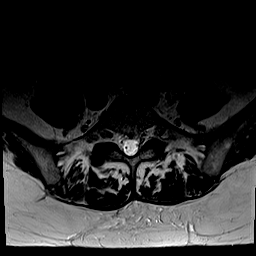
[im 11/36]
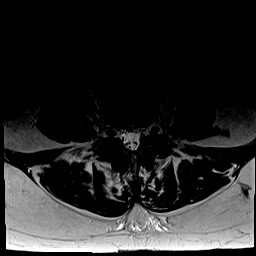
[im 17/36]
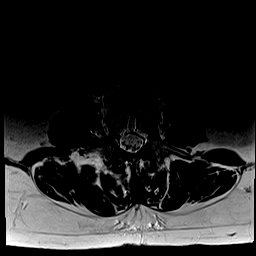
[im 19/36]
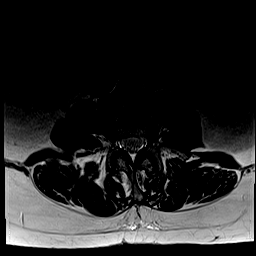
[im 25/36]
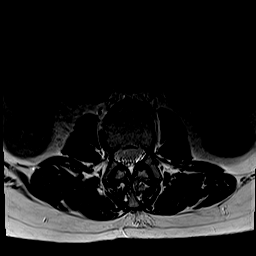
[im 30/36]
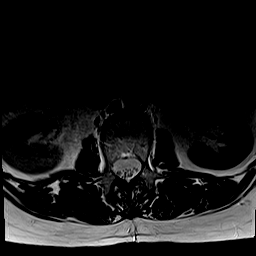
[im 36/36]
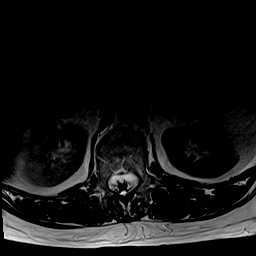

[Series 9: T1 · axial · 4.0mm · 0.39mm/px · z∈[-89,+119]mm · 8 of 36 slices shown (2 of 2)]
[im 1/36]
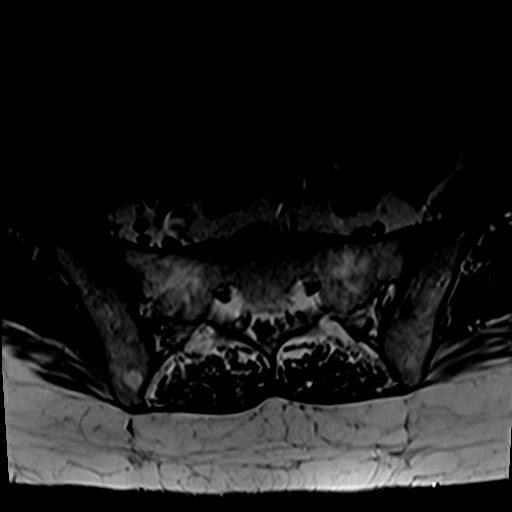
[im 6/36]
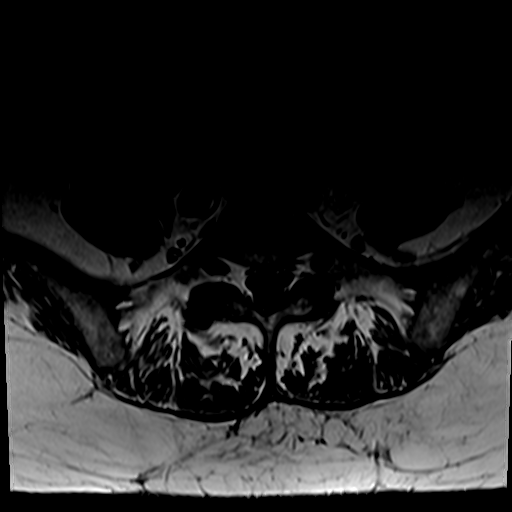
[im 11/36]
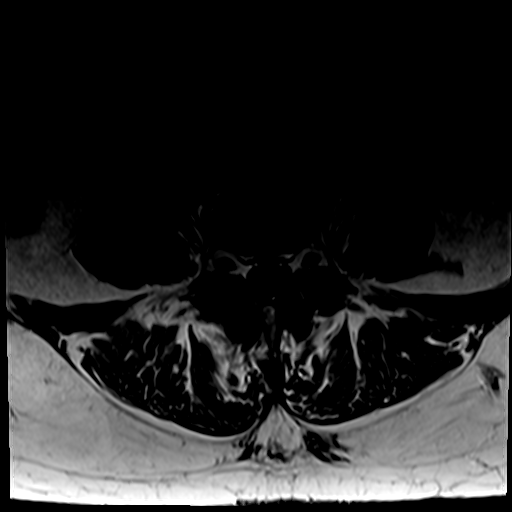
[im 17/36]
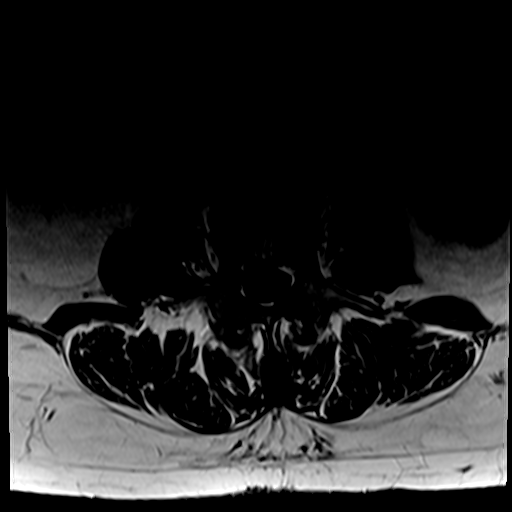
[im 19/36]
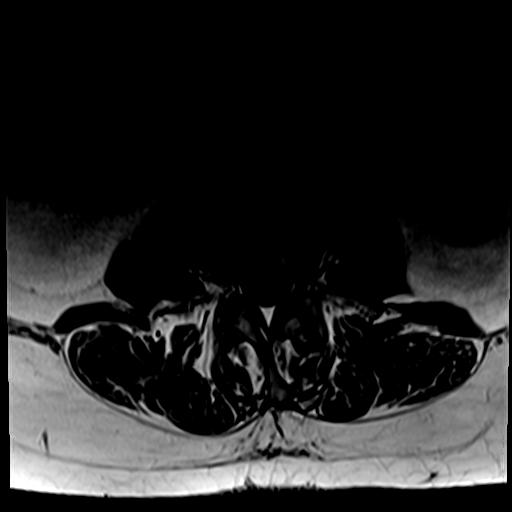
[im 25/36]
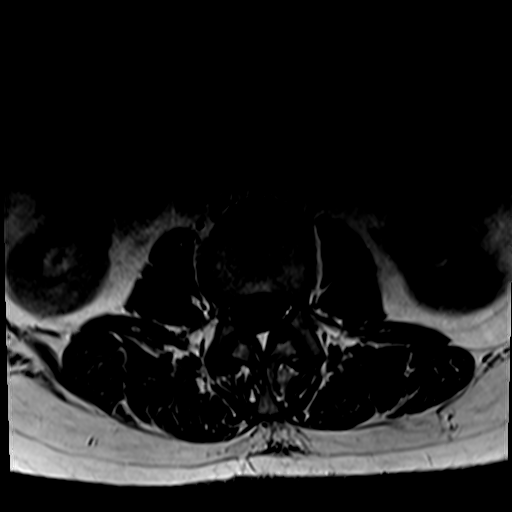
[im 30/36]
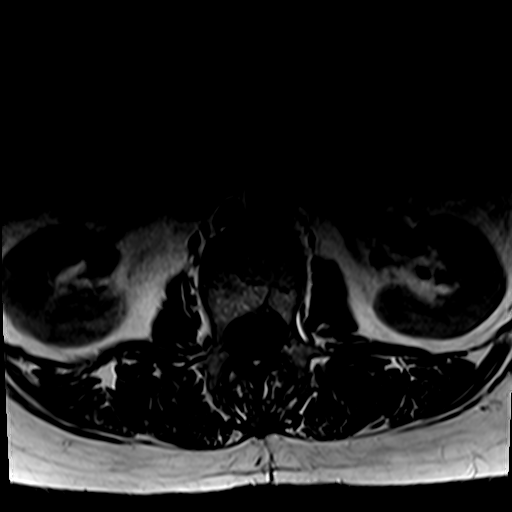
[im 36/36]
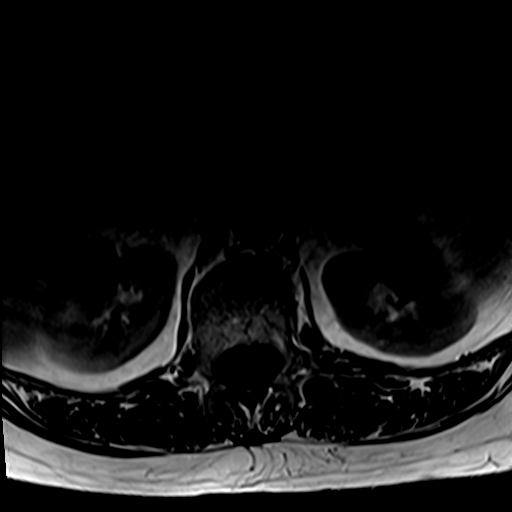

[31 of 48 positions shown; findings below may reference images not displayed]

FINDINGS: Segmentation:  Standard.

Alignment:  Minimal anterolisthesis of L4 over L5.

Vertebrae: No fracture, evidence of discitis, or bone lesion.
Schmorl nodes are seen from T11 through L4.

Conus medullaris and cauda equina: Conus extends to the T12-L1
level. Conus and cauda equina appear normal.

Paraspinal and other soft tissues: Negative.

Disc levels:

Shallow disc bulge and mild facet degenerative changes. No spinal
canal or neural foraminal stenosis.

L1-2: Mild facet degenerative changes. No spinal canal or neural
foraminal stenosis.

L2-3: Shallow disc bulge, facet degenerative changes and ligamentum
flavum redundancy without significant spinal canal or neural
foraminal stenosis.

L3-4: Shallow disc bulge, moderate facet degenerative changes and
ligamentum flavum redundancy without significant spinal canal or
neural foraminal stenosis.

L4-5: Moderate facet degenerative changes. No significant spinal
canal or neural foraminal stenosis.

L5-S1: Facet degenerative changes, right greater than left. No
significant spinal canal or neural foraminal stenosis.
IMPRESSION: 1. Degenerative changes of the lumbar spine, more pronounced at the
facet joints, particularly at the L3-4 and L4-5 bilaterally and on
the right side at L5-S1.
2. No significant spinal canal or neural foraminal stenosis at any
level.

## 2020-08-12 IMAGING — DX DG WRIST COMPLETE 3+V*L*
3 series · 3 of 3 positions shown · non-contrast
Comparison: None.

CLINICAL DATA: Left wrist pain and swelling

EXAM:
LEFT WRIST - COMPLETE 3+ VIEW

[wrist ap]
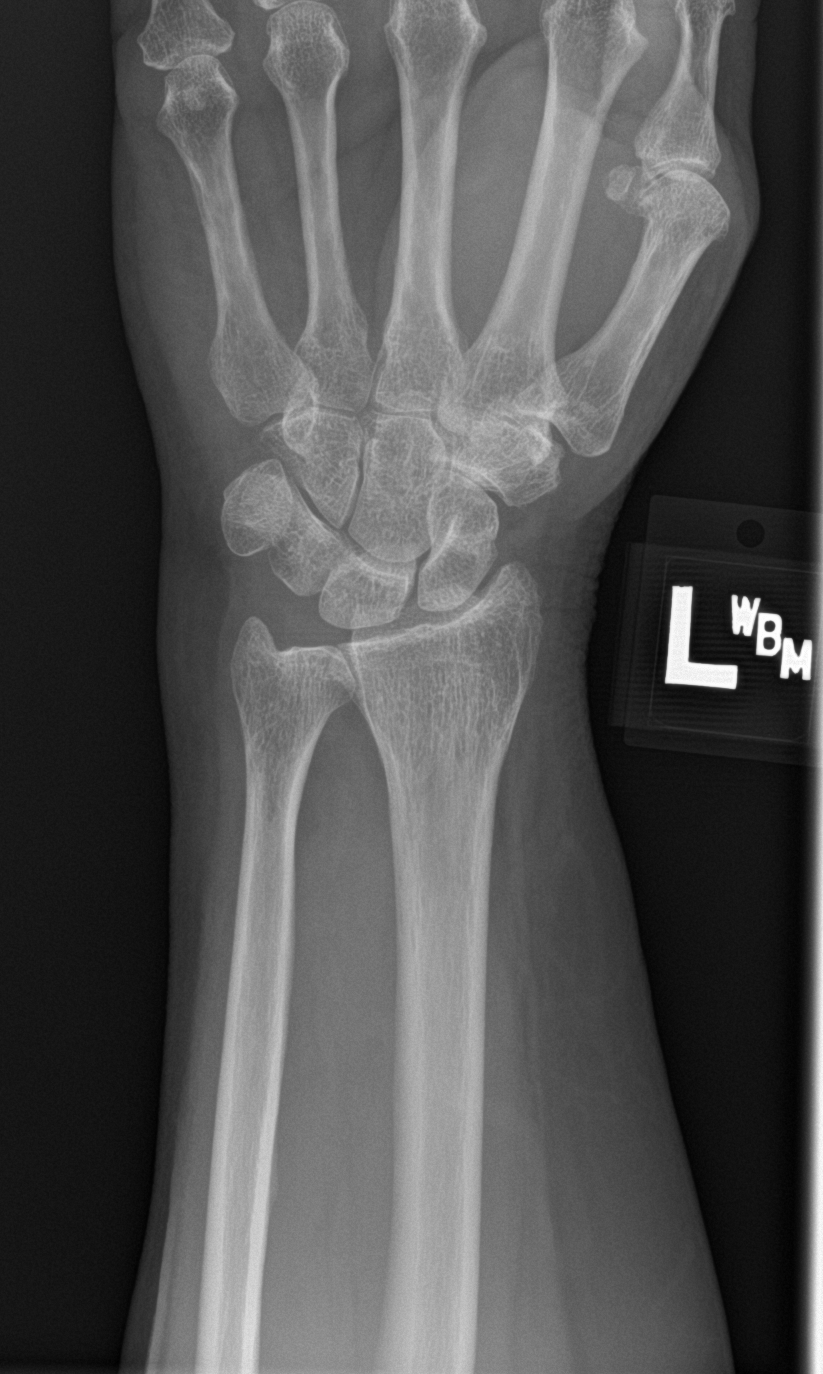

[wrist obl]
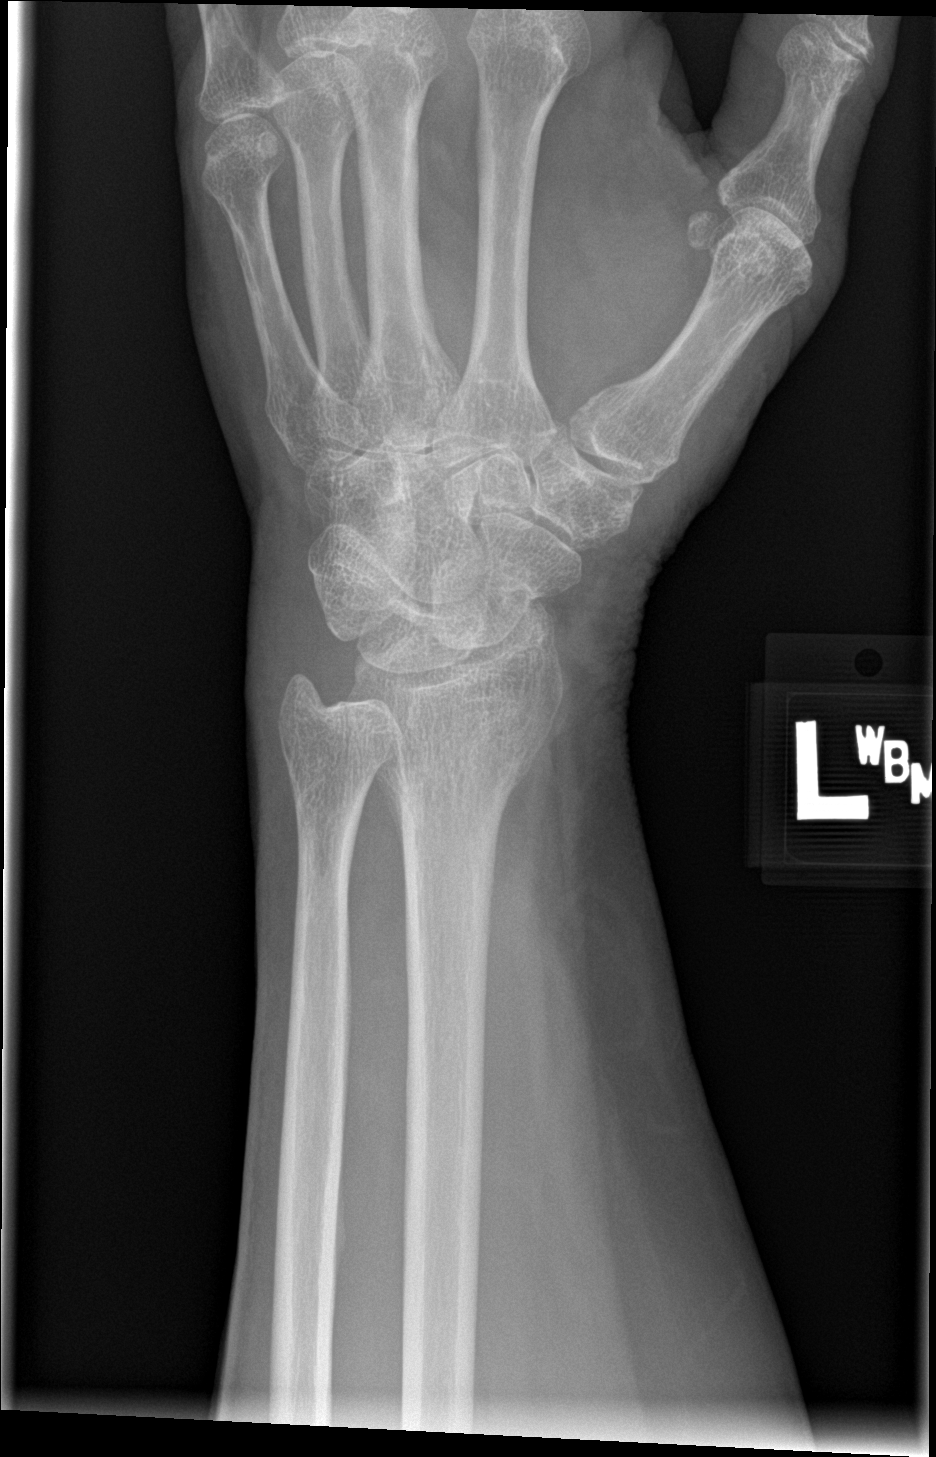

[wrist lat]
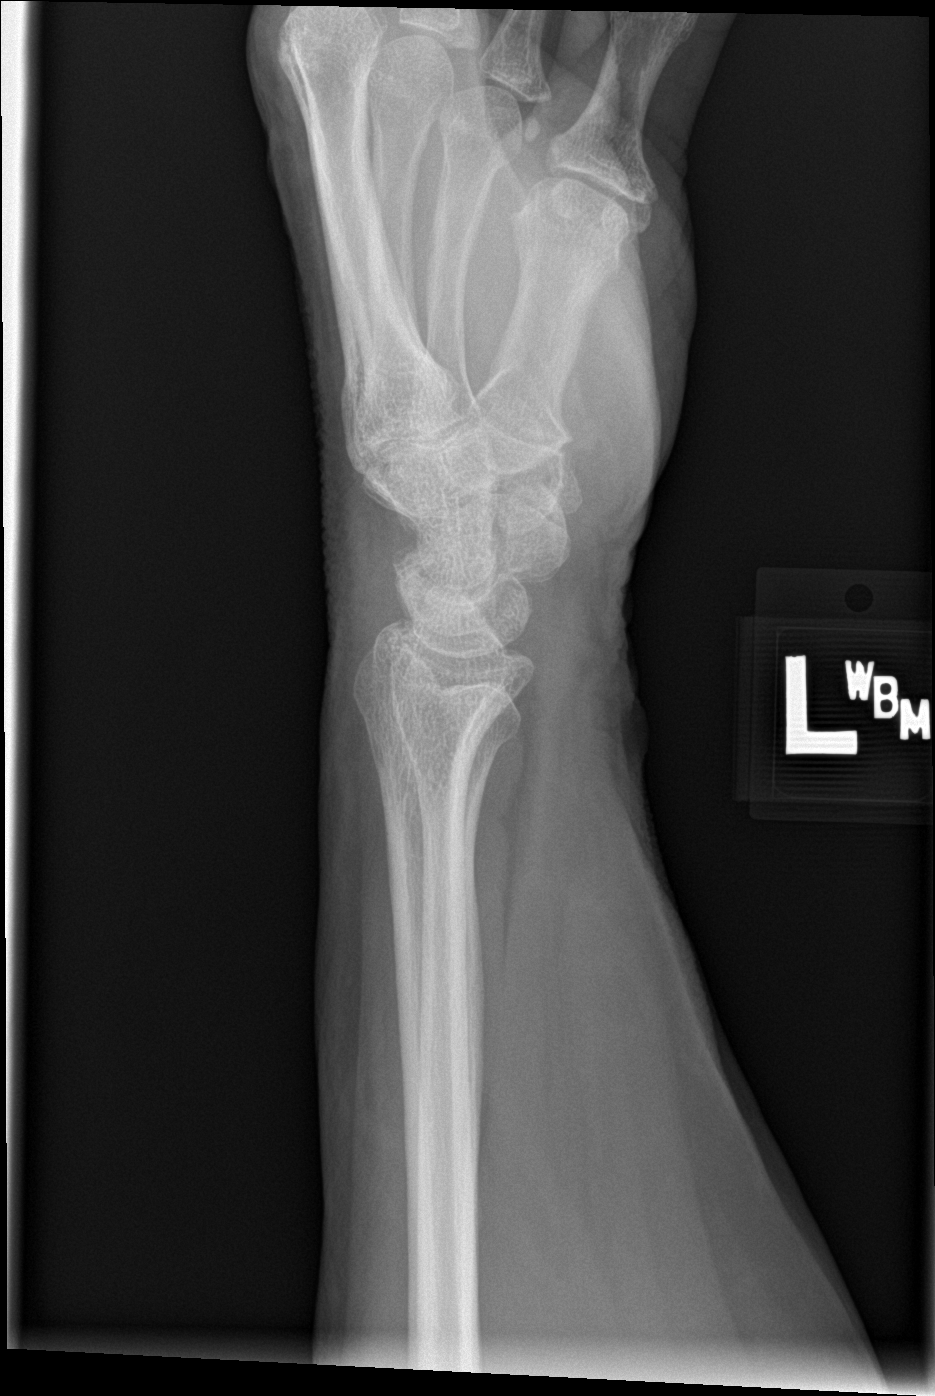

[3 of 3 positions shown; findings below may reference images not displayed]

FINDINGS: There is no evidence of fracture or dislocation. Mild to moderate
arthropathy at the first MCP joint. Mild joint space narrowing at
the first CMC and triscaphe joints. No erosion. Soft tissues are
unremarkable.
IMPRESSION: Mild osteoarthritis of the left hand and wrist.

## 2020-08-30 DIAGNOSIS — F411 Generalized anxiety disorder: Secondary | ICD-10-CM | POA: Diagnosis not present

## 2020-08-30 DIAGNOSIS — Z96641 Presence of right artificial hip joint: Secondary | ICD-10-CM | POA: Diagnosis not present

## 2020-08-30 DIAGNOSIS — G8929 Other chronic pain: Secondary | ICD-10-CM | POA: Diagnosis not present

## 2020-08-30 DIAGNOSIS — Z471 Aftercare following joint replacement surgery: Secondary | ICD-10-CM | POA: Diagnosis not present

## 2020-08-30 DIAGNOSIS — K219 Gastro-esophageal reflux disease without esophagitis: Secondary | ICD-10-CM | POA: Diagnosis not present

## 2020-08-30 DIAGNOSIS — F32A Depression, unspecified: Secondary | ICD-10-CM | POA: Diagnosis not present

## 2020-08-30 DIAGNOSIS — M48061 Spinal stenosis, lumbar region without neurogenic claudication: Secondary | ICD-10-CM | POA: Diagnosis not present

## 2020-08-30 DIAGNOSIS — I10 Essential (primary) hypertension: Secondary | ICD-10-CM | POA: Diagnosis not present

## 2020-08-30 DIAGNOSIS — L4050 Arthropathic psoriasis, unspecified: Secondary | ICD-10-CM | POA: Diagnosis not present

## 2020-08-30 DIAGNOSIS — M47816 Spondylosis without myelopathy or radiculopathy, lumbar region: Secondary | ICD-10-CM | POA: Diagnosis not present

## 2020-08-30 DIAGNOSIS — Z7951 Long term (current) use of inhaled steroids: Secondary | ICD-10-CM | POA: Diagnosis not present

## 2020-09-11 DIAGNOSIS — Z96641 Presence of right artificial hip joint: Secondary | ICD-10-CM | POA: Diagnosis not present

## 2020-10-13 ENCOUNTER — Other Ambulatory Visit: Payer: Self-pay | Admitting: Family Medicine

## 2020-10-13 DIAGNOSIS — F419 Anxiety disorder, unspecified: Secondary | ICD-10-CM

## 2020-10-20 ENCOUNTER — Other Ambulatory Visit: Payer: Self-pay | Admitting: Family Medicine

## 2020-10-20 DIAGNOSIS — G4709 Other insomnia: Secondary | ICD-10-CM

## 2020-10-20 NOTE — Telephone Encounter (Signed)
Requested medication (s) are due for refill today: yes   Requested medication (s) are on the active medication list: yes   Last refill: 07/15/2020  Future visit scheduled: no  Notes to clinic: this refill cannot be delegated    Requested Prescriptions  Pending Prescriptions Disp Refills   QUEtiapine (SEROQUEL) 200 MG tablet [Pharmacy Med Name: QUETIAPINE 200MG  TABLETS] 90 tablet 1    Sig: TAKE 1 TABLET(200 MG) BY MOUTH AT BEDTIME      Not Delegated - Psychiatry:  Antipsychotics - Second Generation (Atypical) - quetiapine Failed - 10/20/2020  8:49 AM      Failed - This refill cannot be delegated      Passed - ALT in normal range and within 180 days    ALT  Date Value Ref Range Status  07/18/2020 32 0 - 44 U/L Final          Passed - AST in normal range and within 180 days    AST  Date Value Ref Range Status  07/18/2020 24 15 - 41 U/L Final          Passed - Completed PHQ-2 or PHQ-9 in the last 360 days      Passed - Last BP in normal range    BP Readings from Last 1 Encounters:  08/02/20 125/70          Passed - Valid encounter within last 6 months    Recent Outpatient Visits           2 months ago Acute cystitis with hematuria   [place], DO   4 months ago Irritable bowel syndrome with both constipation and diarrhea   [facility] Kathrine Haddock, NP   4 months ago Viral upper respiratory tract infection   [facility] Kathrine Haddock, NP   6 months ago Cough   [facility] [place], Lupita Raider, FNP   7 months ago Right otitis media, unspecified otitis media type   [facility], Lupita Raider, [place]

## 2020-10-25 ENCOUNTER — Encounter: Payer: Self-pay | Admitting: Internal Medicine

## 2020-10-25 ENCOUNTER — Other Ambulatory Visit: Payer: Self-pay

## 2020-10-25 ENCOUNTER — Ambulatory Visit (INDEPENDENT_AMBULATORY_CARE_PROVIDER_SITE_OTHER): Payer: Medicare Other | Admitting: Internal Medicine

## 2020-10-25 ENCOUNTER — Other Ambulatory Visit: Payer: Self-pay | Admitting: Internal Medicine

## 2020-10-25 VITALS — BP 136/82 | HR 87 | Temp 97.2°F | Resp 17 | Ht 66.0 in | Wt 202.8 lb

## 2020-10-25 DIAGNOSIS — G8929 Other chronic pain: Secondary | ICD-10-CM | POA: Diagnosis not present

## 2020-10-25 DIAGNOSIS — F419 Anxiety disorder, unspecified: Secondary | ICD-10-CM | POA: Diagnosis not present

## 2020-10-25 DIAGNOSIS — L4050 Arthropathic psoriasis, unspecified: Secondary | ICD-10-CM | POA: Diagnosis not present

## 2020-10-25 DIAGNOSIS — I10 Essential (primary) hypertension: Secondary | ICD-10-CM

## 2020-10-25 DIAGNOSIS — K582 Mixed irritable bowel syndrome: Secondary | ICD-10-CM

## 2020-10-25 DIAGNOSIS — M545 Low back pain, unspecified: Secondary | ICD-10-CM

## 2020-10-25 DIAGNOSIS — F5104 Psychophysiologic insomnia: Secondary | ICD-10-CM | POA: Diagnosis not present

## 2020-10-25 DIAGNOSIS — G4709 Other insomnia: Secondary | ICD-10-CM | POA: Diagnosis not present

## 2020-10-25 DIAGNOSIS — Z6832 Body mass index (BMI) 32.0-32.9, adult: Secondary | ICD-10-CM

## 2020-10-25 DIAGNOSIS — J4540 Moderate persistent asthma, uncomplicated: Secondary | ICD-10-CM

## 2020-10-25 DIAGNOSIS — E6609 Other obesity due to excess calories: Secondary | ICD-10-CM

## 2020-10-25 DIAGNOSIS — F331 Major depressive disorder, recurrent, moderate: Secondary | ICD-10-CM | POA: Diagnosis not present

## 2020-10-25 DIAGNOSIS — K219 Gastro-esophageal reflux disease without esophagitis: Secondary | ICD-10-CM

## 2020-10-25 MED ORDER — QUETIAPINE FUMARATE 200 MG PO TABS: 200.0000 mg | ORAL_TABLET | Freq: Every day | ORAL | 1 refills | Status: DC

## 2020-10-25 MED ORDER — LOSARTAN POTASSIUM 50 MG PO TABS: 50.0000 mg | ORAL_TABLET | Freq: Every day | ORAL | 0 refills | Status: DC

## 2020-10-25 MED ORDER — LUBIPROSTONE 24 MCG PO CAPS: 24.0000 ug | ORAL_CAPSULE | Freq: Two times a day (BID) | ORAL | 1 refills | Status: DC

## 2020-10-25 NOTE — Assessment & Plan Note (Signed)
Encouraged exercise for weight loss and core strengthening Encouraged regular stretching Continue tramadol as needed

## 2020-10-25 NOTE — Assessment & Plan Note (Signed)
Amitiza refilled today

## 2020-10-25 NOTE — Assessment & Plan Note (Signed)
Stable on paroxetine and Seroquel Support offered

## 2020-10-25 NOTE — Assessment & Plan Note (Signed)
Stable on her current dose of trazodone Will monitor

## 2020-10-25 NOTE — Telephone Encounter (Signed)
   Notes to clinic:  Requesting a 90 day supply    Requested Prescriptions  Pending Prescriptions Disp Refills   losartan (COZAAR) 50 MG tablet [Pharmacy Med Name: LOSARTAN 50MG  TABLETS] 90 tablet     Sig: TAKE 1 TABLET(50 MG) BY MOUTH DAILY      Cardiovascular:  Angiotensin Receptor Blockers Passed - 10/25/2020 11:18 AM      Passed - Cr in normal range and within 180 days    Creat  Date Value Ref Range Status  10/09/2017 0.90 0.50 - 0.99 mg/dL Final    Comment:    For patients >29 years of age, the reference limit for Creatinine is approximately 13% higher for people identified as African-American. .    Creatinine, Ser  Date Value Ref Range Status  07/28/2020 0.89 0.44 - 1.00 mg/dL Final          Passed - K in normal range and within 180 days    Potassium  Date Value Ref Range Status  07/28/2020 4.1 3.5 - 5.1 mmol/L Final          Passed - Patient is not pregnant      Passed - Last BP in normal range    BP Readings from Last 1 Encounters:  10/25/20 136/82          Passed - Valid encounter within last 6 months    Recent Outpatient Visits           Today Moderate episode of recurrent major depressive disorder [facility])   [facility] [place], [name], NP   2 months ago Acute cystitis with hematuria   [place], DO   4 months ago Irritable bowel syndrome with both constipation and diarrhea   [place], NP   4 months ago Viral upper respiratory tract infection   [place], NP   6 months ago Cough   [facility], Lupita Raider, FNP       Future Appointments             In 6 months Baity, [name], NP [facility], [facility]

## 2020-10-25 NOTE — Patient Instructions (Signed)
Heart-Healthy Eating Plan Heart-healthy meal planning includes: Eating less unhealthy fats. Eating more healthy fats. Making other changes in your diet. Talk with your doctor or a diet specialist (dietitian) to create an eating plan that is right for you. What is my plan? Your doctor may recommend an eating plan that includes: Total fat: ______% or less of total calories a day. Saturated fat: ______% or less of total calories a day. Cholesterol: less than _________mg a day. What are tips for following this plan? Cooking Avoid frying your food. Try to bake, boil, grill, or broil it instead. You can also reduce fat by: Removing the skin from poultry. Removing all visible fats from meats. Steaming vegetables in water or broth. Meal planning  At meals, divide your plate into four equal parts: Fill one-half of your plate with vegetables and green salads. Fill one-fourth of your plate with whole grains. Fill one-fourth of your plate with lean protein foods. Eat 4-5 servings of vegetables per day. A serving of vegetables is: 1 cup of raw or cooked vegetables. 2 cups of raw leafy greens. Eat 4-5 servings of fruit per day. A serving of fruit is: 1 medium whole fruit.  cup of dried fruit.  cup of fresh, frozen, or canned fruit.  cup of 100% fruit juice. Eat more foods that have soluble fiber. These are apples, broccoli, carrots, beans, peas, and barley. Try to get 20-30 g of fiber per day. Eat 4-5 servings of nuts, legumes, and seeds per week: 1 serving of dried beans or legumes equals  cup after being cooked. 1 serving of nuts is  cup. 1 serving of seeds equals 1 tablespoon.  General information Eat more home-cooked food. Eat less restaurant, buffet, and fast food. Limit or avoid alcohol. Limit foods that are high in starch and sugar. Avoid fried foods. Lose weight if you are overweight. Keep track of how much salt (sodium) you eat. This is important if you have high blood  pressure. Ask your doctor to tell you more about this. Try to add vegetarian meals each week. Fats Choose healthy fats. These include olive oil and canola oil, flaxseeds, walnuts, almonds, and seeds. Eat more omega-3 fats. These include salmon, mackerel, sardines, tuna, flaxseed oil, and ground flaxseeds. Try to eat fish at least 2 times each week. Check food labels. Avoid foods with trans fats or high amounts of saturated fat. Limit saturated fats. These are often found in animal products, such as meats, butter, and cream. These are also found in plant foods, such as palm oil, palm kernel oil, and coconut oil. Avoid foods with partially hydrogenated oils in them. These have trans fats. Examples are stick margarine, some tub margarines, cookies, crackers, and other baked goods. What foods can I eat? Fruits All fresh, canned (in natural juice), or frozen fruits. Vegetables Fresh or frozen vegetables (raw, steamed, roasted, or grilled). Green salads. Grains Most grains. Choose whole wheat and whole grains most of the time. Rice andpasta, including brown rice and pastas made with whole wheat. Meats and other proteins Lean, well-trimmed beef, veal, pork, and lamb. Chicken and turkey without skin. All fish and shellfish. Wild duck, rabbit, pheasant, and venison. Egg whites or low-cholesterol egg substitutes. Dried beans, peas, lentils, and tofu. Seedsand most nuts. Dairy Low-fat or nonfat cheeses, including ricotta and mozzarella. Skim or 1% milk that is liquid, powdered, or evaporated. Buttermilk that is made with low-fatmilk. Nonfat or low-fat yogurt. Fats and oils Non-hydrogenated (trans-free) margarines. Vegetable oils, including soybean, sesame,   sunflower, olive, peanut, safflower, corn, canola, and cottonseed. Salad dressings or mayonnaisemade with a vegetable oil. Beverages Mineral water. Coffee and tea. Diet carbonated beverages. Sweets and desserts Sherbet, gelatin, and fruit ice. Small  amounts of dark chocolate. Limit all sweets and desserts. Seasonings and condiments All seasonings and condiments. The items listed above may not be a complete list of foods and drinks you can eat. Contact a dietitian for more options. What foods should I avoid? Fruits Canned fruit in heavy syrup. Fruit in cream or butter sauce. Fried fruit. Limitcoconut. Vegetables Vegetables cooked in cheese, cream, or butter sauce. Fried vegetables. Grains Breads that are made with saturated or trans fats, oils, or whole milk. Croissants. Sweet rolls. Donuts. High-fat crackers,such as cheese crackers. Meats and other proteins Fatty meats, such as hot dogs, ribs, sausage, bacon, rib-eye roast or steak. High-fat deli meats, such as salami and bologna. Caviar. Domestic duck andgoose. Organ meats, such as liver. Dairy Cream, sour cream, cream cheese, and creamed cottage cheese. Whole-milk cheeses. Whole or 2% milk that is liquid, evaporated, or condensed. Whole buttermilk. Cream sauce or high-fat cheese sauce. Yogurt that is made fromwhole milk. Fats and oils Meat fat, or shortening. Cocoa butter, hydrogenated oils, palm oil, coconut oil, palm kernel oil. Solid fats and shortenings, including bacon fat, salt pork, lard, and butter. Nondairy cream substitutes. Salad dressings with cheeseor sour cream. Beverages Regular sodas and juice drinks with added sugar. Sweets and desserts Frosting. Pudding. Cookies. Cakes. Pies. Milk chocolate or white chocolate.Buttered syrups. Full-fat ice cream or ice cream drinks. The items listed above may not be a complete list of foods and drinks to avoid. Contact a dietitian for more information. Summary Heart-healthy meal planning includes eating less unhealthy fats, eating more healthy fats, and making other changes in your diet. Eat a balanced diet. This includes fruits and vegetables, low-fat or nonfat dairy, lean protein, nuts and legumes, whole grains, and heart-healthy  oils and fats. This information is not intended to replace advice given to you by your health care provider. Make sure you discuss any questions you have with your healthcare provider. Document Revised: 06/26/2017 Document Reviewed: 05/30/2017 Elsevier Patient Education  2022 Elsevier Inc.  

## 2020-10-25 NOTE — Assessment & Plan Note (Signed)
Encourage diet and exercise for weight loss 

## 2020-10-25 NOTE — Assessment & Plan Note (Signed)
Stable on her current dose of paroxetine and Seroquel Support offered

## 2020-10-25 NOTE — Assessment & Plan Note (Signed)
Encouraged weight loss as this can help reduce reflux symptoms Continue pantoprazole 

## 2020-10-25 NOTE — Assessment & Plan Note (Signed)
Will DC verapamil Rx for losartan 50 mg 1 tab p.o. daily Reinforced DASH diet and exercise for weight loss  RTC in 2 weeks for nurse visit BP check

## 2020-10-25 NOTE — Assessment & Plan Note (Signed)
Continue Singulair, Symbicort and albuterol Will monitor

## 2020-10-25 NOTE — Progress Notes (Signed)
Subjective:    Patient ID: Michele Newton, female    DOB: 04/16/1956, 65 y.o.   MRN: [id number]  HPI  Patient presents the clinic today for follow-up of chronic conditions.  She is establishing care with me today, transferring care from [facility], NP.  HTN: Her BP today is 136/82.  She is taking Verapamil as prescribed but does not feel it is very effective.  ECG from 07/2020 reviewed.  GERD: She is not sure what triggers this.  She takes Pantoprazole with good relief of symptoms.  There is no upper GI on file.  Psoriatic Arthritis: Mainly in her elbows, knees.  She is not taking anything for this. She is not following with rheumatology.  OA: Mainly in her back and hips. She takes Tramadol as needed with good relief.  Anxiety and Depression: Chronic, managed on Paroxetine and Seroquel.  She is not currently seeing a therapist.  She does not follow with psychiatry.  She denies SI/HI.  Insomnia: She has difficulty falling asleep.  She takes Trazodone as prescribed with good relief of symptoms.  There is no sleep study on file.  Asthma: She reports chronic cough and shortness of breath.  She is taking Singulair, Symbicort and Albuterol as prescribed.  There are no PFTs on file.  IBS: Alternating constipation and diarrhea.  She is not taking Amitiza as prescribed.  There is no colonoscopy on file.  Review of Systems     Past Medical History:  Diagnosis Date   Anxiety    Arthritis    Asthma    Depression    Frequent headaches    GERD (gastroesophageal reflux disease)    Hydrocephalus (HCC) 1995   Migraine    Psoriatic arthritis (HCC)     Current Outpatient Medications  Medication Sig Dispense Refill   albuterol (VENTOLIN HFA) 108 (90 Base) MCG/ACT inhaler INHALE 1 TO 2 PUFFS INTO THE LUNGS EVERY 6 HOURS AS NEEDED FOR WHEEZING OR SHORTNESS OF BREATH (Patient taking differently: Inhale 1-2 puffs into the lungs every 6 (six) hours as needed for wheezing or shortness of breath.) 6.7  g 1   ARIPiprazole (ABILIFY) 2 MG tablet Take 1 tablet (2 mg total) by mouth daily. (Patient not taking: Reported on 08/02/2020) 90 tablet 1   budesonide-formoterol (SYMBICORT) 160-4.5 MCG/ACT inhaler Inhale 2 puffs into the lungs 2 (two) times daily. 1 each 3   cephALEXin (KEFLEX) 500 MG capsule Take 1 capsule (500 mg total) by mouth 3 (three) times daily. For 7 days 21 capsule 0   docusate sodium (COLACE) 100 MG capsule Take 1 capsule (100 mg total) by mouth 2 (two) times daily. 10 capsule 0   enoxaparin (LOVENOX) 40 MG/0.4ML injection Inject 0.4 mLs (40 mg total) into the skin daily for 14 days. 5.6 mL 0   fluticasone (FLONASE) 50 MCG/ACT nasal spray Place 2 sprays into both nostrils daily. Use for 4-6 weeks then stop and use seasonally or as needed. 16 g 3   lubiprostone (AMITIZA) 24 MCG capsule Take 1 capsule (24 mcg total) by mouth 2 (two) times daily with a meal. (Patient not taking: Reported on 08/02/2020) 60 capsule 2   methocarbamol (ROBAXIN) 500 MG tablet Take 1 tablet (500 mg total) by mouth every 6 (six) hours as needed for muscle spasms. 30 tablet 0   montelukast (SINGULAIR) 10 MG tablet Take 1 tablet (10 mg total) by mouth at bedtime. 90 tablet 1   Naltrexone-buPROPion HCl ER 8-90 MG TB12 Start 1 tablet every  morning for 7 days, then 1 tablet twice daily for 7 days, then 2 tablets every morning and one in the evening, then 2 tablets twice daily (Patient not taking: No sig reported) 120 tablet 0   oxyCODONE (OXY IR/ROXICODONE) 5 MG immediate release tablet Take 1-2 tablets (5-10 mg total) by mouth every 4 (four) hours as needed for moderate pain. 30 tablet 0   pantoprazole (PROTONIX) 40 MG tablet TAKE 1 TABLET(40 MG) BY MOUTH TWICE DAILY (Patient taking differently: Take 40 mg by mouth 2 (two) times daily.) 180 tablet 0   PARoxetine (PAXIL) 40 MG tablet TAKE 1 TABLET(40 MG) BY MOUTH AT BEDTIME 90 tablet 0   QUEtiapine (SEROQUEL) 200 MG tablet Take 1 tablet (200 mg total) by mouth at  bedtime. 90 tablet 1   traMADol (ULTRAM) 50 MG tablet Take 1 tablet (50 mg total) by mouth every 6 (six) hours. 30 tablet 0   traZODone (DESYREL) 50 MG tablet Take 0.5-1 tablets (25-50 mg total) by mouth at bedtime as needed for sleep. (Patient taking differently: Take 50 mg by mouth at bedtime.) 30 tablet 3   verapamil (CALAN) 120 MG tablet Take 1 tablet (120 mg total) by mouth daily. 90 tablet 1   No current facility-administered medications for this visit.    Allergies  Allergen Reactions   Amitriptyline Other (See Comments)    hallucinations   Effexor [Venlafaxine] Other (See Comments)    Amnesia, hospitalized x 2 weeks   Haldol [Haloperidol] Other (See Comments)    Paralyzed vocal cords     Family History  Problem Relation Age of Onset   Arthritis Mother    Hyperlipidemia Mother    Heart disease Mother 79       Died of heart failure   Hypertension Mother    Diabetes Mother    Depression Mother    Arthritis Father    Cancer Father        prostate cancer   Diabetes Brother    Breast cancer Maternal Grandmother     Social History   Socioeconomic History   Marital status: Married    Spouse name: [name]   Number of children: 2   Years of education: 14   Highest education level: Not on file  Occupational History    Employer: OTHER  Tobacco Use   Smoking status: Never   Smokeless tobacco: Never  Vaping Use   Vaping Use: Never used  Substance and Sexual Activity   Alcohol use: No   Drug use: No   Sexual activity: Not Currently    Birth control/protection: Surgical  Other Topics Concern   Not on file  Social History Narrative   Jasey lives at home with her husband, [name], of 5 months and a cat named, Puff. She has 2 sons from a previous marriage. She has 4 grandchildren.    Social Determinants of Health   Financial Resource Strain: Not on file  Food Insecurity: Not on file  Transportation Needs: Not on file  Physical Activity: Not on file  Stress: Not on file   Social Connections: Not on file  Intimate Partner Violence: Not on file     Constitutional: Patient reports fatigue.  Denies fever, malaise, fatigue, headache or abrupt weight changes.  HEENT: Denies eye pain, eye redness, ear pain, ringing in the ears, wax buildup, runny nose, nasal congestion, bloody nose, or sore throat. Respiratory: Denies difficulty breathing, shortness of breath, cough or sputum production.   Cardiovascular: Denies chest pain, chest tightness,  palpitations or swelling in the hands or feet.  Gastrointestinal: Patient reports alternating diarrhea and constipation.  Denies abdominal pain, bloating, or blood in the stool.  GU: Denies urgency, frequency, pain with urination, burning sensation, blood in urine, odor or discharge. Musculoskeletal: Patient reports chronic low back pain.  Denies decrease in range of motion, difficulty with gait, or joint swelling.  Skin: Denies redness, rashes, lesions or ulcercations.  Neurological: Patient reports insomnia.  Denies dizziness, difficulty with memory, difficulty with speech or problems with balance and coordination.  Psych: Patient has a history of anxiety and depression.  Denies SI/HI.  No other specific complaints in a complete review of systems (except as listed in HPI above).  Objective:   Physical Exam  BP 136/82 (BP Location: Left Arm, Patient Position: Sitting, Cuff Size: Large)   Pulse 87   Temp (!) 97.2 F (36.2 C) (Temporal)   Resp 17   Ht 5\' 6"  (1.676 m)   Wt 202 lb 12.8 oz (92 kg)   SpO2 98%   BMI 32.73 kg/m   Wt Readings from Last 3 Encounters:  07/18/20 207 lb 3.2 oz (94 kg)  06/20/20 207 lb 3.2 oz (94 kg)  03/13/20 205 lb (93 kg)    General: Appears her stated age, obese, in NAD. Skin: Warm, dry and intact. No rashes noted. HEENT: Head: normal shape and size; Eyes: sclera white and EOMs intact;  Cardiovascular: Normal rate and rhythm. S1,S2 noted.  No murmur, rubs or gallops noted. No JVD or BLE  edema. No carotid bruits noted. Pulmonary/Chest: Normal effort and positive vesicular breath sounds. No respiratory distress. No wheezes, rales or ronchi noted.  Abdomen: Soft and nontender. Normal bowel sounds.  Musculoskeletal:No difficulty with gait.  Neurological: Alert and oriented. Psychiatric: Mood and affect normal. Behavior is normal. Judgment and thought content normal.    BMET    Component Value Date/Time   NA 137 07/28/2020 0527   K 4.1 07/28/2020 0527   CL 106 07/28/2020 0527   CO2 25 07/28/2020 0527   GLUCOSE 122 (H) 07/28/2020 0527   BUN 13 07/28/2020 0527   CREATININE 0.89 07/28/2020 0527   CREATININE 0.90 10/09/2017 1024   CALCIUM 8.6 (L) 07/28/2020 0527   GFRNONAA >60 07/28/2020 0527   GFRNONAA 69 10/09/2017 1024   GFRAA >60 11/20/2018 0408   GFRAA 79 10/09/2017 1024    Lipid Panel     Component Value Date/Time   CHOL 289 (H) 02/20/2017 0841   TRIG 165.0 (H) 02/20/2017 0841   HDL 38.40 (L) 02/20/2017 0841   CHOLHDL 8 02/20/2017 0841   VLDL 33.0 02/20/2017 0841   LDLCALC 218 (H) 02/20/2017 0841    CBC    Component Value Date/Time   WBC 10.4 07/29/2020 0430   RBC 4.02 07/29/2020 0430   HGB 11.6 (L) 07/29/2020 0430   HCT 34.9 (L) 07/29/2020 0430   PLT 302 07/29/2020 0430   MCV 86.8 07/29/2020 0430   MCH 28.9 07/29/2020 0430   MCHC 33.2 07/29/2020 0430   RDW 13.6 07/29/2020 0430   LYMPHSABS 2.2 07/18/2020 1054   MONOABS 0.8 07/18/2020 1054   EOSABS 0.1 07/18/2020 1054   BASOSABS 0.1 07/18/2020 1054    Hgb A1C Lab Results  Component Value Date   HGBA1C 5.5 08/09/2019            Assessment & Plan:   [name], NP This visit occurred during the SARS-CoV-2 public health emergency.  Safety protocols were in place, including screening questions  prior to the visit, additional usage of staff PPE, and extensive cleaning of exam room while observing appropriate contact time as indicated for disinfecting solutions.

## 2020-10-25 NOTE — Assessment & Plan Note (Signed)
Managed without medications Will monitor

## 2020-11-08 ENCOUNTER — Ambulatory Visit: Payer: Medicare Other

## 2020-11-12 ENCOUNTER — Other Ambulatory Visit: Payer: Self-pay | Admitting: Family Medicine

## 2020-11-12 DIAGNOSIS — F5104 Psychophysiologic insomnia: Secondary | ICD-10-CM

## 2020-11-12 NOTE — Telephone Encounter (Signed)
Requested Prescriptions  Pending Prescriptions Disp Refills  . traZODone (DESYREL) 50 MG tablet [Pharmacy Med Name: TRAZODONE 50MG  TABLETS] 30 tablet 3    Sig: TAKE 1/2 TO 1 TABLET(25 TO 50 MG) BY MOUTH AT BEDTIME AS NEEDED FOR SLEEP     Psychiatry: Antidepressants - Serotonin Modulator Passed - 11/12/2020  7:15 AM      Passed - Completed PHQ-2 or PHQ-9 in the last 360 days      Passed - Valid encounter within last 6 months    Recent Outpatient Visits          2 weeks ago Moderate episode of recurrent major depressive disorder [facility])   [facility] [place], [name], NP   3 months ago Acute cystitis with hematuria   [place], DO   4 months ago Irritable bowel syndrome with both constipation and diarrhea   [facility] Kathrine Haddock, NP   5 months ago Viral upper respiratory tract infection   [place], NP   6 months ago Cough   [facility], Lupita Raider, FNP      Future Appointments            In 5 months [place], [name], NP [facility], [facility]

## 2020-11-21 ENCOUNTER — Other Ambulatory Visit: Payer: Self-pay | Admitting: Internal Medicine

## 2020-11-21 NOTE — Telephone Encounter (Signed)
She cancelled her blood pressure recheck, not refilling until she comes for nurse visit

## 2020-11-21 NOTE — Telephone Encounter (Signed)
   Notes to clinic:  Patient requests 90 days supply   Requested Prescriptions  Pending Prescriptions Disp Refills   losartan (COZAAR) 50 MG tablet [Pharmacy Med Name: LOSARTAN 50MG  TABLETS] 90 tablet     Sig: TAKE 1 TABLET(50 MG) BY MOUTH DAILY      Cardiovascular:  Angiotensin Receptor Blockers Passed - 11/21/2020  9:59 AM      Passed - Cr in normal range and within 180 days    Creat  Date Value Ref Range Status  10/09/2017 0.90 0.50 - 0.99 mg/dL Final    Comment:    For patients >70 years of age, the reference limit for Creatinine is approximately 13% higher for people identified as African-American. .    Creatinine, Ser  Date Value Ref Range Status  07/28/2020 0.89 0.44 - 1.00 mg/dL Final          Passed - K in normal range and within 180 days    Potassium  Date Value Ref Range Status  07/28/2020 4.1 3.5 - 5.1 mmol/L Final          Passed - Patient is not pregnant      Passed - Last BP in normal range    BP Readings from Last 1 Encounters:  10/25/20 136/82          Passed - Valid encounter within last 6 months    Recent Outpatient Visits           3 weeks ago Moderate episode of recurrent major depressive disorder [facility])   [facility] [place], [name], NP   3 months ago Acute cystitis with hematuria   [place], DO   5 months ago Irritable bowel syndrome with both constipation and diarrhea   [place], NP   5 months ago Viral upper respiratory tract infection   [place], NP   7 months ago Cough   [facility], Lupita Raider, FNP       Future Appointments             In 5 months Baity, [name], NP [facility], [facility]

## 2020-11-27 DIAGNOSIS — H2511 Age-related nuclear cataract, right eye: Secondary | ICD-10-CM | POA: Diagnosis not present

## 2020-11-27 DIAGNOSIS — H35033 Hypertensive retinopathy, bilateral: Secondary | ICD-10-CM | POA: Diagnosis not present

## 2020-11-27 DIAGNOSIS — H52213 Irregular astigmatism, bilateral: Secondary | ICD-10-CM | POA: Diagnosis not present

## 2020-11-27 DIAGNOSIS — H25013 Cortical age-related cataract, bilateral: Secondary | ICD-10-CM | POA: Diagnosis not present

## 2020-11-27 DIAGNOSIS — H2513 Age-related nuclear cataract, bilateral: Secondary | ICD-10-CM | POA: Diagnosis not present

## 2020-11-27 DIAGNOSIS — H1045 Other chronic allergic conjunctivitis: Secondary | ICD-10-CM | POA: Diagnosis not present

## 2020-12-04 ENCOUNTER — Other Ambulatory Visit: Payer: Self-pay

## 2020-12-08 DIAGNOSIS — M1611 Unilateral primary osteoarthritis, right hip: Secondary | ICD-10-CM | POA: Diagnosis not present

## 2020-12-08 DIAGNOSIS — Z96641 Presence of right artificial hip joint: Secondary | ICD-10-CM | POA: Diagnosis not present

## 2020-12-12 ENCOUNTER — Other Ambulatory Visit: Payer: Self-pay | Admitting: Family Medicine

## 2020-12-12 DIAGNOSIS — H25011 Cortical age-related cataract, right eye: Secondary | ICD-10-CM | POA: Diagnosis not present

## 2020-12-12 DIAGNOSIS — J4520 Mild intermittent asthma, uncomplicated: Secondary | ICD-10-CM

## 2020-12-12 DIAGNOSIS — H2511 Age-related nuclear cataract, right eye: Secondary | ICD-10-CM | POA: Diagnosis not present

## 2020-12-20 ENCOUNTER — Telehealth: Payer: Self-pay

## 2020-12-20 NOTE — Telephone Encounter (Signed)
Dysuria since starting on the Losartan. I scheduled the patient an appt for tomorrow at 11:20am.

## 2020-12-20 NOTE — Telephone Encounter (Signed)
Copied from [place] [phone]. Topic: General - Other >> Dec 20, 2020  3:21 PM Valere Dross wrote: Reason for CRM: Pt called in stating the medication she was put on is not helping her and wants to see about getting put on something different. She also states she believes its giving her a UTI and wants to know if she can get some antibiotics. Please advise. >> Dec 20, 2020  3:24 PM Valere Dross wrote: losartan (COZAAR) 50 MG tablet

## 2020-12-21 ENCOUNTER — Other Ambulatory Visit: Payer: Self-pay

## 2020-12-21 ENCOUNTER — Other Ambulatory Visit: Payer: Self-pay | Admitting: Internal Medicine

## 2020-12-21 ENCOUNTER — Ambulatory Visit (INDEPENDENT_AMBULATORY_CARE_PROVIDER_SITE_OTHER): Payer: Medicare Other | Admitting: Internal Medicine

## 2020-12-21 ENCOUNTER — Encounter: Payer: Self-pay | Admitting: Internal Medicine

## 2020-12-21 VITALS — BP 143/83 | HR 85 | Temp 97.1°F | Resp 17 | Ht 66.0 in | Wt 199.6 lb

## 2020-12-21 DIAGNOSIS — Z6832 Body mass index (BMI) 32.0-32.9, adult: Secondary | ICD-10-CM | POA: Diagnosis not present

## 2020-12-21 DIAGNOSIS — R300 Dysuria: Secondary | ICD-10-CM

## 2020-12-21 DIAGNOSIS — E6609 Other obesity due to excess calories: Secondary | ICD-10-CM

## 2020-12-21 DIAGNOSIS — I10 Essential (primary) hypertension: Secondary | ICD-10-CM

## 2020-12-21 MED ORDER — VERAPAMIL HCL ER 180 MG PO TBCR: 180.0000 mg | EXTENDED_RELEASE_TABLET | Freq: Every day | ORAL | 0 refills | Status: DC

## 2020-12-21 MED ORDER — NITROFURANTOIN MONOHYD MACRO 100 MG PO CAPS: 100.0000 mg | ORAL_CAPSULE | Freq: Two times a day (BID) | ORAL | 0 refills | Status: DC

## 2020-12-21 NOTE — Progress Notes (Signed)
Subjective:    Patient ID: Michele Newton, female    DOB: 1956/03/15, 65 y.o.   MRN: [id number]  HPI  Patient presents to the clinic today for follow-up of hypertension.  She has been taking Losartan as prescribed but feels like this is causing her urgency, frequency and dysuria.  She was switched from verapamil because she did not feel like that medication was effective.  Her BP today is 143/83.  ECG from 07/2020 reviewed.  She also reports urinary urgency, frequency and dysuria.  She reports this started 6 weeks ago.  She denies urine odor, blood in her urine, vaginal symptoms, fever, chills, nausea or vomiting.  She has taken AZO OTC with some relief of symptoms.  Review of Systems     Past Medical History:  Diagnosis Date   Anxiety    Arthritis    Asthma    Depression    Frequent headaches    GERD (gastroesophageal reflux disease)    Hydrocephalus (HCC) 1995   Migraine    Psoriatic arthritis (HCC)     Current Outpatient Medications  Medication Sig Dispense Refill   albuterol (VENTOLIN HFA) 108 (90 Base) MCG/ACT inhaler INHALE 1 TO 2 PUFFS INTO THE LUNGS EVERY 6 HOURS AS NEEDED FOR WHEEZING OR SHORTNESS OF BREATH (Patient taking differently: Inhale 1-2 puffs into the lungs every 6 (six) hours as needed for wheezing or shortness of breath.) 6.7 g 1   budesonide-formoterol (SYMBICORT) 160-4.5 MCG/ACT inhaler Inhale 2 puffs into the lungs 2 (two) times daily. 1 each 3   fluticasone (FLONASE) 50 MCG/ACT nasal spray Place 2 sprays into both nostrils daily. Use for 4-6 weeks then stop and use seasonally or as needed. 16 g 3   losartan (COZAAR) 50 MG tablet TAKE 1 TABLET(50 MG) BY MOUTH DAILY 90 tablet 2   lubiprostone (AMITIZA) 24 MCG capsule Take 1 capsule (24 mcg total) by mouth 2 (two) times daily with a meal. 180 capsule 1   montelukast (SINGULAIR) 10 MG tablet TAKE 1 TABLET(10 MG) BY MOUTH AT BEDTIME 90 tablet 1   pantoprazole (PROTONIX) 40 MG tablet TAKE 1 TABLET(40 MG) BY MOUTH  TWICE DAILY (Patient taking differently: Take 40 mg by mouth 2 (two) times daily.) 180 tablet 0   PARoxetine (PAXIL) 40 MG tablet TAKE 1 TABLET(40 MG) BY MOUTH AT BEDTIME 90 tablet 0   QUEtiapine (SEROQUEL) 200 MG tablet Take 1 tablet (200 mg total) by mouth at bedtime. 90 tablet 1   traMADol (ULTRAM) 50 MG tablet Take 1 tablet (50 mg total) by mouth every 6 (six) hours. (Patient not taking: Reported on 10/25/2020) 30 tablet 0   traZODone (DESYREL) 50 MG tablet TAKE 1/2 TO 1 TABLET(25 TO 50 MG) BY MOUTH AT BEDTIME AS NEEDED FOR SLEEP 30 tablet 3   No current facility-administered medications for this visit.    Allergies  Allergen Reactions   Amitriptyline Other (See Comments)    hallucinations   Effexor [Venlafaxine] Other (See Comments)    Amnesia, hospitalized x 2 weeks   Haldol [Haloperidol] Other (See Comments)    Paralyzed vocal cords     Family History  Problem Relation Age of Onset   Arthritis Mother    Hyperlipidemia Mother    Heart disease Mother 28       Died of heart failure   Hypertension Mother    Diabetes Mother    Depression Mother    Arthritis Father    Cancer Father  prostate cancer   Diabetes Brother    Breast cancer Maternal Grandmother     Social History   Socioeconomic History   Marital status: Married    Spouse name: [name]   Number of children: 2   Years of education: 14   Highest education level: Not on file  Occupational History    Employer: OTHER  Tobacco Use   Smoking status: Never   Smokeless tobacco: Never  Vaping Use   Vaping Use: Never used  Substance and Sexual Activity   Alcohol use: No   Drug use: No   Sexual activity: Not Currently    Birth control/protection: Surgical  Other Topics Concern   Not on file  Social History Narrative   Klynn lives at home with her husband, [name], of 5 months and a cat named, Puff. She has 2 sons from a previous marriage. She has 4 grandchildren.    Social Determinants of Health   Financial  Resource Strain: Not on file  Food Insecurity: Not on file  Transportation Needs: Not on file  Physical Activity: Not on file  Stress: Not on file  Social Connections: Not on file  Intimate Partner Violence: Not on file     Constitutional: Denies fever, malaise, fatigue, headache or abrupt weight changes.  Respiratory: Denies difficulty breathing, shortness of breath, cough or sputum production.   Cardiovascular: Denies chest pain, chest tightness, palpitations or swelling in the hands or feet.  GU: Patient reports urgency, frequency dysuria.  Denies blood in urine, odor or discharge. Neurological: Denies dizziness, difficulty with memory, difficulty with speech or problems with balance and coordination.   No other specific complaints in a complete review of systems (except as listed in HPI above).  Objective:   Physical Exam  BP (!) 143/83 (BP Location: Right Arm, Patient Position: Sitting, Cuff Size: Large)   Pulse 85   Temp (!) 97.1 F (36.2 C)   Resp 17   Ht '5\' 6"'$  (1.676 m)   Wt 199 lb 9.6 oz (90.5 kg)   SpO2 98%   BMI 32.22 kg/m   Wt Readings from Last 3 Encounters:  10/25/20 202 lb 12.8 oz (92 kg)  07/18/20 207 lb 3.2 oz (94 kg)  06/20/20 207 lb 3.2 oz (94 kg)    General: Appears her stated age, obese, in NAD. HEENT: Head: normal shape and size; Eyes: sclera white and EOMs intact;  Cardiovascular: Normal rate and rhythm. S1,S2 noted.  No murmur, rubs or gallops noted.  Pulmonary/Chest: Normal effort and positive vesicular breath sounds. No respiratory distress. No wheezes, rales or ronchi noted.  Neurological: Alert and oriented.    BMET    Component Value Date/Time   NA 137 07/28/2020 0527   K 4.1 07/28/2020 0527   CL 106 07/28/2020 0527   CO2 25 07/28/2020 0527   GLUCOSE 122 (H) 07/28/2020 0527   BUN 13 07/28/2020 0527   CREATININE 0.89 07/28/2020 0527   CREATININE 0.90 10/09/2017 1024   CALCIUM 8.6 (L) 07/28/2020 0527   GFRNONAA >60 07/28/2020 0527    GFRNONAA 69 10/09/2017 1024   GFRAA >60 11/20/2018 0408   GFRAA 79 10/09/2017 1024    Lipid Panel     Component Value Date/Time   CHOL 289 (H) 02/20/2017 0841   TRIG 165.0 (H) 02/20/2017 0841   HDL 38.40 (L) 02/20/2017 0841   CHOLHDL 8 02/20/2017 0841   VLDL 33.0 02/20/2017 0841   LDLCALC 218 (H) 02/20/2017 0841    CBC  Component Value Date/Time   WBC 10.4 07/29/2020 0430   RBC 4.02 07/29/2020 0430   HGB 11.6 (L) 07/29/2020 0430   HCT 34.9 (L) 07/29/2020 0430   PLT 302 07/29/2020 0430   MCV 86.8 07/29/2020 0430   MCH 28.9 07/29/2020 0430   MCHC 33.2 07/29/2020 0430   RDW 13.6 07/29/2020 0430   LYMPHSABS 2.2 07/18/2020 1054   MONOABS 0.8 07/18/2020 1054   EOSABS 0.1 07/18/2020 1054   BASOSABS 0.1 07/18/2020 1054    Hgb A1C Lab Results  Component Value Date   HGBA1C 5.5 08/09/2019           Assessment & Plan:   Urgency, frequency and dysuria:  Advised her this is likely not coming from her blood pressure medication Urinalysis: Interference with AZO We will send urine culture Rx for Macrobid 100 mg twice daily x5 days Push fluids  RTC in 2 weeks for BP check [name], NP This visit occurred during the SARS-CoV-2 public health emergency.  Safety protocols were in place, including screening questions prior to the visit, additional usage of staff PPE, and extensive cleaning of exam room while observing appropriate contact time as indicated for disinfecting solutions.

## 2020-12-21 NOTE — Telephone Encounter (Signed)
  Notes to clinic:  Patient requests 90 days supply   Requested Prescriptions  Pending Prescriptions Disp Refills   verapamil (CALAN-SR) 180 MG CR tablet [Pharmacy Med Name: VERAPAMIL ER '180MG'$  TABLETS] 90 tablet     Sig: TAKE 1 TABLET(180 MG) BY MOUTH AT BEDTIME     Cardiovascular:  Calcium Channel Blockers Failed - 12/21/2020 11:38 AM      Failed - Last BP in normal range    BP Readings from Last 1 Encounters:  12/21/20 (!) 143/83          Passed - Valid encounter within last 6 months    Recent Outpatient Visits           Today [facility] [place], [place] W, NP   1 month ago Moderate episode of recurrent major depressive disorder [facility]   [facility] [place], [name], NP   4 months ago Acute cystitis with hematuria   [place], DO   6 months ago Irritable bowel syndrome with both constipation and diarrhea   [place], NP   6 months ago Viral upper respiratory tract infection   [place], NP       Future Appointments             In 1 week Garnette Gunner, [name], NP [facility], [place]   In 4 months [place], [name], NP [facility], [facility]

## 2020-12-21 NOTE — Assessment & Plan Note (Signed)
Encourage diet and exercise weight loss 

## 2020-12-21 NOTE — Assessment & Plan Note (Signed)
Uncontrolled on Losartan, will DC Will restart Verapamil but increase dose to 180 mg daily Reinforced DASH diet and exercise for weight loss

## 2020-12-21 NOTE — Patient Instructions (Signed)

## 2020-12-22 LAB — URINE CULTURE
MICRO NUMBER:: 12261437
SPECIMEN QUALITY:: ADEQUATE

## 2021-01-01 DIAGNOSIS — H25012 Cortical age-related cataract, left eye: Secondary | ICD-10-CM | POA: Diagnosis not present

## 2021-01-01 DIAGNOSIS — H2512 Age-related nuclear cataract, left eye: Secondary | ICD-10-CM | POA: Diagnosis not present

## 2021-01-03 ENCOUNTER — Other Ambulatory Visit: Payer: Self-pay

## 2021-01-03 ENCOUNTER — Encounter: Payer: Self-pay | Admitting: Internal Medicine

## 2021-01-03 ENCOUNTER — Ambulatory Visit (INDEPENDENT_AMBULATORY_CARE_PROVIDER_SITE_OTHER): Payer: Medicare Other | Admitting: Internal Medicine

## 2021-01-03 DIAGNOSIS — E6609 Other obesity due to excess calories: Secondary | ICD-10-CM | POA: Diagnosis not present

## 2021-01-03 DIAGNOSIS — Z6832 Body mass index (BMI) 32.0-32.9, adult: Secondary | ICD-10-CM

## 2021-01-03 DIAGNOSIS — I10 Essential (primary) hypertension: Secondary | ICD-10-CM

## 2021-01-03 MED ORDER — VERAPAMIL HCL ER 180 MG PO TBCR: 180.0000 mg | EXTENDED_RELEASE_TABLET | Freq: Every day | ORAL | 1 refills | Status: DC

## 2021-01-03 NOTE — Progress Notes (Signed)
Subjective:    Patient ID: Michele Newton, female    DOB: Jul 15, 1955, 65 y.o.   MRN: [id number]  HPI  Pt presents to the clinic today for 2 week follow up of HTN. At her last visit, her Losartan was d/c'd due to ineffectiveness and she was restarted on Verapamil. She has been taking the medications as prescribed. Her BP today is 134/83.  Review of Systems     Past Medical History:  Diagnosis Date   Anxiety    Arthritis    Asthma    Depression    Frequent headaches    GERD (gastroesophageal reflux disease)    Hydrocephalus (HCC) 1995   Migraine    Psoriatic arthritis (HCC)     Current Outpatient Medications  Medication Sig Dispense Refill   albuterol (VENTOLIN HFA) 108 (90 Base) MCG/ACT inhaler INHALE 1 TO 2 PUFFS INTO THE LUNGS EVERY 6 HOURS AS NEEDED FOR WHEEZING OR SHORTNESS OF BREATH (Patient taking differently: Inhale 1-2 puffs into the lungs every 6 (six) hours as needed for wheezing or shortness of breath.) 6.7 g 1   budesonide-formoterol (SYMBICORT) 160-4.5 MCG/ACT inhaler Inhale 2 puffs into the lungs 2 (two) times daily. 1 each 3   fluticasone (FLONASE) 50 MCG/ACT nasal spray Place 2 sprays into both nostrils daily. Use for 4-6 weeks then stop and use seasonally or as needed. 16 g 3   montelukast (SINGULAIR) 10 MG tablet TAKE 1 TABLET(10 MG) BY MOUTH AT BEDTIME 90 tablet 1   nitrofurantoin, macrocrystal-monohydrate, (MACROBID) 100 MG capsule Take 1 capsule (100 mg total) by mouth 2 (two) times daily. 10 capsule 0   pantoprazole (PROTONIX) 40 MG tablet TAKE 1 TABLET(40 MG) BY MOUTH TWICE DAILY (Patient taking differently: Take 40 mg by mouth 2 (two) times daily.) 180 tablet 0   PARoxetine (PAXIL) 40 MG tablet TAKE 1 TABLET(40 MG) BY MOUTH AT BEDTIME 90 tablet 0   phenazopyridine (PYRIDIUM) 95 MG tablet Take 95 mg by mouth 3 (three) times daily as needed for pain.     QUEtiapine (SEROQUEL) 200 MG tablet Take 1 tablet (200 mg total) by mouth at bedtime. 90 tablet 1   traZODone  (DESYREL) 50 MG tablet TAKE 1/2 TO 1 TABLET(25 TO 50 MG) BY MOUTH AT BEDTIME AS NEEDED FOR SLEEP 30 tablet 3   verapamil (CALAN-SR) 180 MG CR tablet Take 1 tablet (180 mg total) by mouth at bedtime. 30 tablet 0   No current facility-administered medications for this visit.    Allergies  Allergen Reactions   Amitriptyline Other (See Comments)    hallucinations   Effexor [Venlafaxine] Other (See Comments)    Amnesia, hospitalized x 2 weeks   Haldol [Haloperidol] Other (See Comments)    Paralyzed vocal cords     Family History  Problem Relation Age of Onset   Arthritis Mother    Hyperlipidemia Mother    Heart disease Mother 56       Died of heart failure   Hypertension Mother    Diabetes Mother    Depression Mother    Arthritis Father    Cancer Father        prostate cancer   Diabetes Brother    Breast cancer Maternal Grandmother     Social History   Socioeconomic History   Marital status: Married    Spouse name: [name]   Number of children: 2   Years of education: 14   Highest education level: Not on file  Occupational History  Employer: OTHER  Tobacco Use   Smoking status: Never   Smokeless tobacco: Never  Vaping Use   Vaping Use: Never used  Substance and Sexual Activity   Alcohol use: No   Drug use: No   Sexual activity: Not Currently    Birth control/protection: Surgical  Other Topics Concern   Not on file  Social History Narrative   Michele Newton lives at home with her husband, [name], of 5 months and a cat named, Puff. She has 2 sons from a previous marriage. She has 4 grandchildren.    Social Determinants of Health   Financial Resource Strain: Not on file  Food Insecurity: Not on file  Transportation Needs: Not on file  Physical Activity: Not on file  Stress: Not on file  Social Connections: Not on file  Intimate Partner Violence: Not on file     Constitutional: Denies fever, malaise, fatigue, headache or abrupt weight changes.  Respiratory: Denies  difficulty breathing, shortness of breath, cough or sputum production.   Cardiovascular: Denies chest pain, chest tightness, palpitations or swelling in the hands or feet.  Neurological: Denies dizziness, difficulty with memory, difficulty with speech or problems with balance and coordination.    No other specific complaints in a complete review of systems (except as listed in HPI above).  Objective:   Physical Exam   BP 134/83 (BP Location: Left Arm, Patient Position: Sitting, Cuff Size: Normal)   Pulse 82   Temp (!) 97.2 F (36.2 C) (Temporal)   Resp 17   Ht '5\' 6"'$  (1.676 m)   Wt 201 lb (91.2 kg)   SpO2 99%   BMI 32.44 kg/m   Wt Readings from Last 3 Encounters:  12/21/20 199 lb 9.6 oz (90.5 kg)  10/25/20 202 lb 12.8 oz (92 kg)  07/18/20 207 lb 3.2 oz (94 kg)    General: Appears her stated age, obese, in NAD. Skin: Warm, dry and intact.  Cardiovascular: Normal rate and rhythm. S1,S2 noted.  No murmur, rubs or gallops noted. No JVD or BLE edema. Pulmonary/Chest: Normal effort and positive vesicular breath sounds. No respiratory distress. No wheezes, rales or ronchi noted.  Neurological: Alert and oriented.   BMET    Component Value Date/Time   NA 137 07/28/2020 0527   K 4.1 07/28/2020 0527   CL 106 07/28/2020 0527   CO2 25 07/28/2020 0527   GLUCOSE 122 (H) 07/28/2020 0527   BUN 13 07/28/2020 0527   CREATININE 0.89 07/28/2020 0527   CREATININE 0.90 10/09/2017 1024   CALCIUM 8.6 (L) 07/28/2020 0527   GFRNONAA >60 07/28/2020 0527   GFRNONAA 69 10/09/2017 1024   GFRAA >60 11/20/2018 0408   GFRAA 79 10/09/2017 1024    Lipid Panel     Component Value Date/Time   CHOL 289 (H) 02/20/2017 0841   TRIG 165.0 (H) 02/20/2017 0841   HDL 38.40 (L) 02/20/2017 0841   CHOLHDL 8 02/20/2017 0841   VLDL 33.0 02/20/2017 0841   LDLCALC 218 (H) 02/20/2017 0841    CBC    Component Value Date/Time   WBC 10.4 07/29/2020 0430   RBC 4.02 07/29/2020 0430   HGB 11.6 (L) 07/29/2020  0430   HCT 34.9 (L) 07/29/2020 0430   PLT 302 07/29/2020 0430   MCV 86.8 07/29/2020 0430   MCH 28.9 07/29/2020 0430   MCHC 33.2 07/29/2020 0430   RDW 13.6 07/29/2020 0430   LYMPHSABS 2.2 07/18/2020 1054   MONOABS 0.8 07/18/2020 1054   EOSABS 0.1 07/18/2020 1054  BASOSABS 0.1 07/18/2020 1054    Hgb A1C Lab Results  Component Value Date   HGBA1C 5.5 08/09/2019           Assessment & Plan:   [name], NP This visit occurred during the SARS-CoV-2 public health emergency.  Safety protocols were in place, including screening questions prior to the visit, additional usage of staff PPE, and extensive cleaning of exam room while observing appropriate contact time as indicated for disinfecting solutions.

## 2021-01-04 ENCOUNTER — Encounter: Payer: Self-pay | Admitting: Internal Medicine

## 2021-01-04 DIAGNOSIS — M25551 Pain in right hip: Secondary | ICD-10-CM | POA: Diagnosis not present

## 2021-01-04 DIAGNOSIS — M79604 Pain in right leg: Secondary | ICD-10-CM | POA: Diagnosis not present

## 2021-01-04 DIAGNOSIS — M545 Low back pain, unspecified: Secondary | ICD-10-CM | POA: Diagnosis not present

## 2021-01-04 NOTE — Patient Instructions (Signed)

## 2021-01-04 NOTE — Assessment & Plan Note (Signed)
Controlled on Verapamil, refilled today Reinforced DASH diet and exercise for weight loss

## 2021-01-04 NOTE — Assessment & Plan Note (Signed)
Reinforced DASH diet and exercise for weight loss

## 2021-01-09 DIAGNOSIS — H25012 Cortical age-related cataract, left eye: Secondary | ICD-10-CM | POA: Diagnosis not present

## 2021-01-09 DIAGNOSIS — H25812 Combined forms of age-related cataract, left eye: Secondary | ICD-10-CM | POA: Diagnosis not present

## 2021-01-09 DIAGNOSIS — H2512 Age-related nuclear cataract, left eye: Secondary | ICD-10-CM | POA: Diagnosis not present

## 2021-01-11 ENCOUNTER — Other Ambulatory Visit: Payer: Self-pay | Admitting: Family Medicine

## 2021-01-11 DIAGNOSIS — F419 Anxiety disorder, unspecified: Secondary | ICD-10-CM

## 2021-01-11 DIAGNOSIS — M25551 Pain in right hip: Secondary | ICD-10-CM | POA: Diagnosis not present

## 2021-01-11 DIAGNOSIS — M545 Low back pain, unspecified: Secondary | ICD-10-CM | POA: Diagnosis not present

## 2021-01-11 DIAGNOSIS — F32A Depression, unspecified: Secondary | ICD-10-CM

## 2021-01-11 DIAGNOSIS — M79604 Pain in right leg: Secondary | ICD-10-CM | POA: Diagnosis not present

## 2021-01-12 DIAGNOSIS — M79604 Pain in right leg: Secondary | ICD-10-CM | POA: Diagnosis not present

## 2021-01-12 DIAGNOSIS — M545 Low back pain, unspecified: Secondary | ICD-10-CM | POA: Diagnosis not present

## 2021-01-12 DIAGNOSIS — M25551 Pain in right hip: Secondary | ICD-10-CM | POA: Diagnosis not present

## 2021-01-17 DIAGNOSIS — M25551 Pain in right hip: Secondary | ICD-10-CM | POA: Diagnosis not present

## 2021-01-17 DIAGNOSIS — M79604 Pain in right leg: Secondary | ICD-10-CM | POA: Diagnosis not present

## 2021-01-17 DIAGNOSIS — M545 Low back pain, unspecified: Secondary | ICD-10-CM | POA: Diagnosis not present

## 2021-01-19 DIAGNOSIS — M25551 Pain in right hip: Secondary | ICD-10-CM | POA: Diagnosis not present

## 2021-01-19 DIAGNOSIS — M79604 Pain in right leg: Secondary | ICD-10-CM | POA: Diagnosis not present

## 2021-01-19 DIAGNOSIS — M545 Low back pain, unspecified: Secondary | ICD-10-CM | POA: Diagnosis not present

## 2021-01-24 DIAGNOSIS — M545 Low back pain, unspecified: Secondary | ICD-10-CM | POA: Diagnosis not present

## 2021-01-24 DIAGNOSIS — M25551 Pain in right hip: Secondary | ICD-10-CM | POA: Diagnosis not present

## 2021-01-24 DIAGNOSIS — M79604 Pain in right leg: Secondary | ICD-10-CM | POA: Diagnosis not present

## 2021-01-31 DIAGNOSIS — M79604 Pain in right leg: Secondary | ICD-10-CM | POA: Diagnosis not present

## 2021-01-31 DIAGNOSIS — M25551 Pain in right hip: Secondary | ICD-10-CM | POA: Diagnosis not present

## 2021-01-31 DIAGNOSIS — M545 Low back pain, unspecified: Secondary | ICD-10-CM | POA: Diagnosis not present

## 2021-02-05 DIAGNOSIS — M79604 Pain in right leg: Secondary | ICD-10-CM | POA: Diagnosis not present

## 2021-02-05 DIAGNOSIS — M545 Low back pain, unspecified: Secondary | ICD-10-CM | POA: Diagnosis not present

## 2021-02-05 DIAGNOSIS — M25551 Pain in right hip: Secondary | ICD-10-CM | POA: Diagnosis not present

## 2021-02-08 DIAGNOSIS — M545 Low back pain, unspecified: Secondary | ICD-10-CM | POA: Diagnosis not present

## 2021-02-08 DIAGNOSIS — M25551 Pain in right hip: Secondary | ICD-10-CM | POA: Diagnosis not present

## 2021-02-08 DIAGNOSIS — M79604 Pain in right leg: Secondary | ICD-10-CM | POA: Diagnosis not present

## 2021-03-05 ENCOUNTER — Other Ambulatory Visit: Payer: Self-pay | Admitting: Orthopedic Surgery

## 2021-03-05 DIAGNOSIS — M1611 Unilateral primary osteoarthritis, right hip: Secondary | ICD-10-CM | POA: Diagnosis not present

## 2021-03-05 DIAGNOSIS — G8929 Other chronic pain: Secondary | ICD-10-CM | POA: Diagnosis not present

## 2021-03-05 DIAGNOSIS — Z96641 Presence of right artificial hip joint: Secondary | ICD-10-CM | POA: Diagnosis not present

## 2021-03-05 DIAGNOSIS — M25551 Pain in right hip: Secondary | ICD-10-CM | POA: Diagnosis not present

## 2021-03-07 ENCOUNTER — Other Ambulatory Visit: Payer: Self-pay | Admitting: Orthopedic Surgery

## 2021-03-07 DIAGNOSIS — G8929 Other chronic pain: Secondary | ICD-10-CM

## 2021-03-07 DIAGNOSIS — M25551 Pain in right hip: Secondary | ICD-10-CM

## 2021-03-07 DIAGNOSIS — Z96641 Presence of right artificial hip joint: Secondary | ICD-10-CM

## 2021-03-10 ENCOUNTER — Other Ambulatory Visit: Payer: Self-pay | Admitting: Internal Medicine

## 2021-03-10 DIAGNOSIS — F5104 Psychophysiologic insomnia: Secondary | ICD-10-CM

## 2021-03-10 NOTE — Telephone Encounter (Signed)
Requested Prescriptions  Pending Prescriptions Disp Refills  . traZODone (DESYREL) 50 MG tablet [Pharmacy Med Name: TRAZODONE 50MG  TABLETS] 30 tablet 3    Sig: TAKE 1/2 TO 1 TABLET(25 TO 50 MG) BY MOUTH AT BEDTIME AS NEEDED FOR SLEEP     Psychiatry: Antidepressants - Serotonin Modulator Passed - 03/10/2021  7:10 AM      Passed - Completed PHQ-2 or PHQ-9 in the last 360 days      Passed - Valid encounter within last 6 months    Recent Outpatient Visits          2 months ago Essential hypertension   [facility] [place], [name], NP   2 months ago [facility] [place], [place], NP   4 months ago Moderate episode of recurrent major depressive disorder [facility])   [facility] [place], [name], NP   7 months ago Acute cystitis with hematuria   [place], DO   8 months ago Irritable bowel syndrome with both constipation and diarrhea   [place], NP      Future Appointments            In 1 month [place], [name], NP [facility] [place], [facility]

## 2021-03-13 ENCOUNTER — Other Ambulatory Visit: Payer: Self-pay

## 2021-03-13 ENCOUNTER — Telehealth (INDEPENDENT_AMBULATORY_CARE_PROVIDER_SITE_OTHER): Payer: Medicare Other | Admitting: Internal Medicine

## 2021-03-13 ENCOUNTER — Encounter: Payer: Self-pay | Admitting: Internal Medicine

## 2021-03-13 DIAGNOSIS — Z20822 Contact with and (suspected) exposure to covid-19: Secondary | ICD-10-CM

## 2021-03-13 DIAGNOSIS — J4541 Moderate persistent asthma with (acute) exacerbation: Secondary | ICD-10-CM | POA: Diagnosis not present

## 2021-03-13 DIAGNOSIS — J069 Acute upper respiratory infection, unspecified: Secondary | ICD-10-CM

## 2021-03-13 MED ORDER — PREDNISONE 10 MG PO TABS: ORAL_TABLET | ORAL | 0 refills | Status: DC

## 2021-03-13 MED ORDER — HYDROCODONE BIT-HOMATROP MBR 5-1.5 MG/5ML PO SOLN: 5.0000 mL | Freq: Three times a day (TID) | ORAL | 0 refills | Status: DC | PRN

## 2021-03-13 MED ORDER — AZITHROMYCIN 250 MG PO TABS: ORAL_TABLET | ORAL | 0 refills | Status: DC

## 2021-03-13 NOTE — Progress Notes (Signed)
Virtual Visit via Video Note  I connected with Michele Newton on 03/13/21 at  1:20 PM EST by a video enabled telemedicine application and verified that I am speaking with the correct person using two identifiers.  Location: Patient: Home Provider: Office  Persons participating in this video call: Michele Silversmith, NP and [name]   I discussed the limitations of evaluation and management by telemedicine and the availability of in person appointments. The patient expressed understanding and agreed to proceed.  History of Present Illness:  Patient reports fatigue, headache, nasal congestion, cough and diarrhea.  She reports this started 2 to 3 days ago.  The headache is located in her forehead.  She describes the pain as pressure.  She denies dizziness or visual changes.  She is going clear mucus out of her nose.  The cough is productive of yellow mucus.  She denies runny nose, ear pain, sore throat, shortness of breath, nausea, vomiting.  She denies fever but has had chills or body aches.  She has not taken anything OTC for symptoms.  She has had sick contacts with similar symptoms.  She has had a negative COVID test at home.  She has a history of asthma, managed on Symbicort, Albuterol, Flonase and Singulair.   Past Medical History:  Diagnosis Date   Anxiety    Arthritis    Asthma    Depression    Frequent headaches    GERD (gastroesophageal reflux disease)    Hydrocephalus (HCC) 1995   Migraine    Psoriatic arthritis (HCC)     Current Outpatient Medications  Medication Sig Dispense Refill   albuterol (VENTOLIN HFA) 108 (90 Base) MCG/ACT inhaler INHALE 1 TO 2 PUFFS INTO THE LUNGS EVERY 6 HOURS AS NEEDED FOR WHEEZING OR SHORTNESS OF BREATH (Patient taking differently: Inhale 1-2 puffs into the lungs every 6 (six) hours as needed for wheezing or shortness of breath.) 6.7 g 1   azithromycin (ZITHROMAX) 250 MG tablet Take 2 tabs today, then 1 tab daily x 4 days 6 tablet 0    budesonide-formoterol (SYMBICORT) 160-4.5 MCG/ACT inhaler Inhale 2 puffs into the lungs 2 (two) times daily. 1 each 3   fluticasone (FLONASE) 50 MCG/ACT nasal spray Place 2 sprays into both nostrils daily. Use for 4-6 weeks then stop and use seasonally or as needed. 16 g 3   HYDROcodone bit-homatropine (HYCODAN) 5-1.5 MG/5ML syrup Take 5 mLs by mouth every 8 (eight) hours as needed for cough. 120 mL 0   methocarbamol (ROBAXIN) 500 MG tablet Take 500 mg by mouth every 6 (six) hours as needed.     montelukast (SINGULAIR) 10 MG tablet TAKE 1 TABLET(10 MG) BY MOUTH AT BEDTIME 90 tablet 1   pantoprazole (PROTONIX) 40 MG tablet TAKE 1 TABLET(40 MG) BY MOUTH TWICE DAILY (Patient taking differently: Take 40 mg by mouth 2 (two) times daily.) 180 tablet 0   PARoxetine (PAXIL) 40 MG tablet TAKE 1 TABLET(40 MG) BY MOUTH AT BEDTIME 90 tablet 0   predniSONE (DELTASONE) 10 MG tablet Take 6 tabs on day 1, 5 tabs on day 2, 4 tabs on day 3, 3 tabs on day 4, 2 tabs on day 5, 1 tab on day 6 21 tablet 0   QUEtiapine (SEROQUEL) 200 MG tablet Take 1 tablet (200 mg total) by mouth at bedtime. 90 tablet 1   traZODone (DESYREL) 50 MG tablet TAKE 1/2 TO 1 TABLET(25 TO 50 MG) BY MOUTH AT BEDTIME AS NEEDED FOR SLEEP 30 tablet 3  verapamil (CALAN-SR) 180 MG CR tablet Take 1 tablet (180 mg total) by mouth at bedtime. 90 tablet 1   losartan (COZAAR) 50 MG tablet Take 50 mg by mouth daily. (Patient not taking: Reported on 03/13/2021)     No current facility-administered medications for this visit.    Allergies  Allergen Reactions   Amitriptyline Other (See Comments)    hallucinations   Effexor [Venlafaxine] Other (See Comments)    Amnesia, hospitalized x 2 weeks   Haldol [Haloperidol] Other (See Comments)    Paralyzed vocal cords     Family History  Problem Relation Age of Onset   Arthritis Mother    Hyperlipidemia Mother    Heart disease Mother 63       Died of heart failure   Hypertension Mother    Diabetes Mother     Depression Mother    Arthritis Father    Cancer Father        prostate cancer   Diabetes Brother    Breast cancer Maternal Grandmother     Social History   Socioeconomic History   Marital status: Married    Spouse name: [name]   Number of children: 2   Years of education: 14   Highest education level: Not on file  Occupational History    Employer: OTHER  Tobacco Use   Smoking status: Never   Smokeless tobacco: Never  Vaping Use   Vaping Use: Never used  Substance and Sexual Activity   Alcohol use: No   Drug use: No   Sexual activity: Not Currently    Birth control/protection: Surgical  Other Topics Concern   Not on file  Social History Narrative   Jet lives at home with her husband, [name], of 5 months and a cat named, Puff. She has 2 sons from a previous marriage. She has 4 grandchildren.    Social Determinants of Health   Financial Resource Strain: Not on file  Food Insecurity: Not on file  Transportation Needs: Not on file  Physical Activity: Not on file  Stress: Not on file  Social Connections: Not on file  Intimate Partner Violence: Not on file     Constitutional: Patient reports chills, fatigue, headache.  Denies fever or abrupt weight changes.  HEENT: Patient reports nasal congestion.  Denies eye pain, eye redness, ear pain, ringing in the ears, wax buildup, runny nose,  bloody nose, or sore throat. Respiratory: Patient reports cough.  Denies difficulty breathing, shortness of breath.   Cardiovascular: Denies chest pain, chest tightness, palpitations or swelling in the hands or feet.  Gastrointestinal: Patient reports diarrhea.  Denies abdominal pain, bloating, constipation, diarrhea or Neurological: Denies dizziness, difficulty with memory, difficulty with speech or problems with balance and coordination.   No other specific complaints in a complete review of systems (except as listed in HPI above).    Observations/Objective:  Wt Readings from Last 3  Encounters:  01/03/21 201 lb (91.2 kg)  12/21/20 199 lb 9.6 oz (90.5 kg)  10/25/20 202 lb 12.8 oz (92 kg)    General: Appears her stated age, unwell but in NAD. HEENT: Head: normal shape and size; Nose: Congestion noted; Throat/Mouth: Hoarseness noted.  Neck:  Neck supple, trachea midline. No masses, lumps or thyromegaly present.  Pulmonary/Chest: Normal effort No respiratory distress Neurological: Alert and oriented.  BMET    Component Value Date/Time   NA 137 07/28/2020 0527   K 4.1 07/28/2020 0527   CL 106 07/28/2020 0527   CO2 25 07/28/2020  0527   GLUCOSE 122 (H) 07/28/2020 0527   BUN 13 07/28/2020 0527   CREATININE 0.89 07/28/2020 0527   CREATININE 0.90 10/09/2017 1024   CALCIUM 8.6 (L) 07/28/2020 0527   GFRNONAA >60 07/28/2020 0527   GFRNONAA 69 10/09/2017 1024   GFRAA >60 11/20/2018 0408   GFRAA 79 10/09/2017 1024    Lipid Panel     Component Value Date/Time   CHOL 289 (H) 02/20/2017 0841   TRIG 165.0 (H) 02/20/2017 0841   HDL 38.40 (L) 02/20/2017 0841   CHOLHDL 8 02/20/2017 0841   VLDL 33.0 02/20/2017 0841   LDLCALC 218 (H) 02/20/2017 0841    CBC    Component Value Date/Time   WBC 10.4 07/29/2020 0430   RBC 4.02 07/29/2020 0430   HGB 11.6 (L) 07/29/2020 0430   HCT 34.9 (L) 07/29/2020 0430   PLT 302 07/29/2020 0430   MCV 86.8 07/29/2020 0430   MCH 28.9 07/29/2020 0430   MCHC 33.2 07/29/2020 0430   RDW 13.6 07/29/2020 0430   LYMPHSABS 2.2 07/18/2020 1054   MONOABS 0.8 07/18/2020 1054   EOSABS 0.1 07/18/2020 1054   BASOSABS 0.1 07/18/2020 1054    Hgb A1C Lab Results  Component Value Date   HGBA1C 5.5 08/09/2019       Assessment and Plan:  Exposure to COVID-19:  Encourage rest and fluids Discussed antiviral treatment but will hold off at this time secondary to concern for rebound case of COVID Rx for Pred taper x6 days-monitor sugars as these will likely be slightly elevated Rx for Azithromycin x5 days Rx for Hycodan as needed for  cough Continue asthma and allergy medications as prescribed  Return precautions discussed Follow Up Instructions:    I discussed the assessment and treatment plan with the patient. The patient was provided an opportunity to ask questions and all were answered. The patient agreed with the plan and demonstrated an understanding of the instructions.   The patient was advised to call back or seek an in-person evaluation if the symptoms worsen or if the condition fails to improve as anticipated.   Michele Silversmith, NP

## 2021-03-13 NOTE — Patient Instructions (Signed)

## 2021-03-14 ENCOUNTER — Telehealth: Payer: Self-pay

## 2021-03-14 NOTE — Telephone Encounter (Signed)
Copied from [place] [id number]. Topic: General - Other >> Mar 13, 2021 11:58 AM Tessa Lerner A wrote: Reason for CRM: The patient has been told by their pharmacy that their HYDROcodone bit-homatropine (HYCODAN) 5-1.5 MG/5ML syrup [[id number]]  prescription is not covered by their insurance provider   The patient would like to know if it is possible to be prescribed an alternative   Please contact further

## 2021-03-15 ENCOUNTER — Ambulatory Visit: Payer: Medicare Other

## 2021-03-15 MED ORDER — PROMETHAZINE-DM 6.25-15 MG/5ML PO SYRP: 5.0000 mL | ORAL_SOLUTION | Freq: Four times a day (QID) | ORAL | 0 refills | Status: DC | PRN

## 2021-03-15 NOTE — Telephone Encounter (Signed)
Promethazine DM sent to pharmacy

## 2021-03-15 NOTE — Telephone Encounter (Signed)
The pt was notified that the prescription was changed and sent over to her pharmacy.

## 2021-03-15 NOTE — Addendum Note (Signed)
Addended by: Jearld Fenton on: 03/15/2021 07:38 AM   Modules accepted: Orders

## 2021-03-22 ENCOUNTER — Encounter: Admission: RE | Admit: 2021-03-22 | Payer: Medicare Other | Source: Ambulatory Visit

## 2021-03-23 ENCOUNTER — Encounter: Payer: Self-pay | Admitting: Internal Medicine

## 2021-03-23 ENCOUNTER — Other Ambulatory Visit: Payer: Self-pay

## 2021-03-23 ENCOUNTER — Ambulatory Visit (INDEPENDENT_AMBULATORY_CARE_PROVIDER_SITE_OTHER): Payer: Medicare Other | Admitting: Internal Medicine

## 2021-03-23 VITALS — BP 134/88 | HR 85 | Temp 97.4°F | Resp 18 | Ht 66.0 in | Wt 197.8 lb

## 2021-03-23 DIAGNOSIS — Z8616 Personal history of COVID-19: Secondary | ICD-10-CM

## 2021-03-23 DIAGNOSIS — J00 Acute nasopharyngitis [common cold]: Secondary | ICD-10-CM | POA: Diagnosis not present

## 2021-03-23 MED ORDER — AZITHROMYCIN 250 MG PO TABS: ORAL_TABLET | ORAL | 0 refills | Status: DC

## 2021-03-23 NOTE — Patient Instructions (Signed)

## 2021-03-23 NOTE — Progress Notes (Signed)
HPI  Pt presents to the clinic today with c/o headache, runny nose, sore throat, cough and shortness of breath.  This started 11 days ago.  The headache is located in her forehead.  She describes the pain as pressure.  She reports associated dizziness but denies visual changes.  She is blowing yellow mucus out of her nose.  She denies difficulty swallowing.  The cough is productive of yellow mucus.  She denies nasal congestion, ear pain, chest pain, nausea, vomiting or diarrhea at this time.  She has been running low-grade fevers but denies chills or body aches.  She has been taking Sudafed and Tylenol OTC with minimal relief of symptoms.  She was seen for the same 11/8, prescribed Prednisone and Azithromycin which she took with some improvement in symptoms but symptoms worsened after stopping these medications.  She has a history of asthma, on Symbicort and Albuterol.  Review of Systems      Past Medical History:  Diagnosis Date   Anxiety    Arthritis    Asthma    Depression    Frequent headaches    GERD (gastroesophageal reflux disease)    Hydrocephalus (HCC) 1995   Migraine    Psoriatic arthritis ([place])     Family History  Problem Relation Age of Onset   Arthritis Mother    Hyperlipidemia Mother    Heart disease Mother 40       Died of heart failure   Hypertension Mother    Diabetes Mother    Depression Mother    Arthritis Father    Cancer Father        prostate cancer   Diabetes Brother    Breast cancer Maternal Grandmother     Social History   Socioeconomic History   Marital status: Married    Spouse name: [name]   Number of children: 2   Years of education: 14   Highest education level: Not on file  Occupational History    Employer: OTHER  Tobacco Use   Smoking status: Never   Smokeless tobacco: Never  Vaping Use   Vaping Use: Never used  Substance and Sexual Activity   Alcohol use: No   Drug use: No   Sexual activity: Not Currently    Birth  control/protection: Surgical  Other Topics Concern   Not on file  Social History Narrative   Jadee lives at home with her husband, [name], of 5 months and a cat named, Puff. She has 2 sons from a previous marriage. She has 4 grandchildren.    Social Determinants of Health   Financial Resource Strain: Not on file  Food Insecurity: Not on file  Transportation Needs: Not on file  Physical Activity: Not on file  Stress: Not on file  Social Connections: Not on file  Intimate Partner Violence: Not on file    Allergies  Allergen Reactions   Amitriptyline Other (See Comments)    hallucinations   Effexor [Venlafaxine] Other (See Comments)    Amnesia, hospitalized x 2 weeks   Haldol [Haloperidol] Other (See Comments)    Paralyzed vocal cords      Constitutional: Positive headache, fatigue and fever. Denies abrupt weight changes.  HEENT:  Positive runny nose, sore throat. Denies eye redness, eye pain, pressure behind the eyes, facial pain, nasal congestion, ear pain, ringing in the ears, wax buildup, or bloody nose. Respiratory: Positive cough and shortness of breath. Denies difficulty breathing.  Cardiovascular: Denies chest pain, chest tightness, palpitations or swelling in the  hands or feet.   No other specific complaints in a complete review of systems (except as listed in HPI above).  Objective:   BP (!) 148/101 (BP Location: Right Arm, Patient Position: Sitting, Cuff Size: Normal)   Pulse 85   Temp (!) 97.4 F (36.3 C) (Temporal)   Resp 18   Ht 5\' 6"  (1.676 m)   Wt 197 lb 12.8 oz (89.7 kg)   SpO2 99%   BMI 31.93 kg/m   Wt Readings from Last 3 Encounters:  01/03/21 201 lb (91.2 kg)  12/21/20 199 lb 9.6 oz (90.5 kg)  10/25/20 202 lb 12.8 oz (92 kg)     General: Appears her stated age, appears unwell but in NAD. HEENT: Head: normal shape and size; Eyes: sclera white, no icterus, conjunctiva pink;  Neck: No cervical lymphadenopathy.  Cardiovascular: Normal rate and  rhythm. S1,S2 noted.  No murmur, rubs or gallops noted.  Pulmonary/Chest: Normal effort and positive vesicular breath sounds. No respiratory distress. No wheezes, rales or ronchi noted.       Assessment & Plan:   Upper Respiratory Infection s/p Covid:  Get some rest and drink plenty of water Do salt water gargles for the sore throat will refill Azithromycin x5 days to start in the next day or 2 if feeling worse 80 mg Depo-Medrol IM today Start Zyrtec OTC daily  RTC as needed or if symptoms persist.   [name], NP This visit occurred during the SARS-CoV-2 public health emergency.  Safety protocols were in place, including screening questions prior to the visit, additional usage of staff PPE, and extensive cleaning of exam room while observing appropriate contact time as indicated for disinfecting solutions.

## 2021-04-03 ENCOUNTER — Encounter
Admission: RE | Admit: 2021-04-03 | Discharge: 2021-04-03 | Disposition: A | Discharge status: Home / Self Care | Payer: Medicare Other | Source: Ambulatory Visit | Attending: Orthopedic Surgery | Admitting: Orthopedic Surgery

## 2021-04-03 ENCOUNTER — Other Ambulatory Visit: Payer: Self-pay

## 2021-04-03 DIAGNOSIS — M25551 Pain in right hip: Secondary | ICD-10-CM | POA: Diagnosis not present

## 2021-04-03 DIAGNOSIS — Z96641 Presence of right artificial hip joint: Secondary | ICD-10-CM | POA: Insufficient documentation

## 2021-04-03 DIAGNOSIS — G8929 Other chronic pain: Secondary | ICD-10-CM | POA: Insufficient documentation

## 2021-04-03 DIAGNOSIS — Z471 Aftercare following joint replacement surgery: Secondary | ICD-10-CM | POA: Diagnosis not present

## 2021-04-03 MED ORDER — TECHNETIUM TC 99M MEDRONATE IV KIT
23.2800 | PACK | Freq: Once | INTRAVENOUS | Status: AC | PRN
  Administered 2021-04-03: 23.28 via INTRAVENOUS

## 2021-04-09 ENCOUNTER — Other Ambulatory Visit: Payer: Self-pay | Admitting: Internal Medicine

## 2021-04-09 DIAGNOSIS — F32A Depression, unspecified: Secondary | ICD-10-CM

## 2021-04-09 DIAGNOSIS — F419 Anxiety disorder, unspecified: Secondary | ICD-10-CM

## 2021-04-09 DIAGNOSIS — G4709 Other insomnia: Secondary | ICD-10-CM

## 2021-04-09 NOTE — Telephone Encounter (Signed)
Requested Prescriptions  Pending Prescriptions Disp Refills  . QUEtiapine (SEROQUEL) 200 MG tablet [Pharmacy Med Name: QUETIAPINE 200MG  TABLETS] 90 tablet 1    Sig: TAKE 1 TABLET(200 MG) BY MOUTH AT BEDTIME     Not Delegated - Psychiatry:  Antipsychotics - Second Generation (Atypical) - quetiapine Failed - 04/09/2021  7:15 AM      Failed - This refill cannot be delegated      Failed - ALT in normal range and within 180 days    ALT  Date Value Ref Range Status  07/18/2020 32 0 - 44 U/L Final         Failed - AST in normal range and within 180 days    AST  Date Value Ref Range Status  07/18/2020 24 15 - 41 U/L Final         Passed - Completed PHQ-2 or PHQ-9 in the last 360 days      Passed - Last BP in normal range    BP Readings from Last 1 Encounters:  03/23/21 134/88         Passed - Valid encounter within last 6 months    Recent Outpatient Visits          2 weeks ago Acute nasopharyngitis   [facility] [place], [place], [place]   3 weeks ago Moderate persistent asthma with acute exacerbation   [place], [name], NP   3 months ago Essential hypertension   [place], [name], NP   3 months ago [facility] [place], [place], NP   5 months ago Moderate episode of recurrent major depressive disorder [facility])   [facility] [place], [name], NP      Future Appointments            In 3 weeks Baity, [name], NP [facility] [place]           . PARoxetine (PAXIL) 40 MG tablet [Pharmacy Med Name: PAROXETINE 40MG  TABLETS] 90 tablet 0    Sig: TAKE 1 TABLET(40 MG) BY MOUTH AT BEDTIME     Psychiatry:  Antidepressants - SSRI Passed - 04/09/2021  7:15 AM      Passed - Completed PHQ-2 or PHQ-9 in the last 360 days      Passed - Valid encounter within last 6 months    Recent Outpatient Visits          2 weeks ago Acute nasopharyngitis   [facility]  [place], [place], NP   3 weeks ago Moderate persistent asthma with acute exacerbation   [place], NP   3 months ago Essential hypertension   [place], [name], NP   3 months ago [facility] [place], [place], NP   5 months ago Moderate episode of recurrent major depressive disorder [facility])   [facility], [name], NP      Future Appointments            In 3 weeks Baity, [name], NP [facility]

## 2021-04-09 NOTE — Telephone Encounter (Signed)
Requested medications are due for refill today.  yes  Requested medications are on the active medications list.  yes  Last refill. 01/12/2021  Future visit scheduled.   yes  Notes to clinic.  Medication not delegated.

## 2021-04-23 ENCOUNTER — Ambulatory Visit (INDEPENDENT_AMBULATORY_CARE_PROVIDER_SITE_OTHER): Payer: Medicare Other | Admitting: Internal Medicine

## 2021-04-23 ENCOUNTER — Other Ambulatory Visit: Payer: Self-pay

## 2021-04-23 ENCOUNTER — Encounter: Payer: Self-pay | Admitting: Internal Medicine

## 2021-04-23 VITALS — BP 136/88 | HR 98 | Temp 97.9°F | Resp 17 | Ht 66.0 in | Wt 200.2 lb

## 2021-04-23 DIAGNOSIS — N951 Menopausal and female climacteric states: Secondary | ICD-10-CM | POA: Diagnosis not present

## 2021-04-23 DIAGNOSIS — R61 Generalized hyperhidrosis: Secondary | ICD-10-CM | POA: Diagnosis not present

## 2021-04-23 DIAGNOSIS — R454 Irritability and anger: Secondary | ICD-10-CM

## 2021-04-23 DIAGNOSIS — Z23 Encounter for immunization: Secondary | ICD-10-CM

## 2021-04-23 NOTE — Patient Instructions (Signed)

## 2021-04-23 NOTE — Addendum Note (Signed)
Addended by: Wilson Singer on: 04/23/2021 02:27 PM   Modules accepted: Orders

## 2021-04-23 NOTE — Progress Notes (Signed)
Subjective:    Patient ID: Michele Newton, female    DOB: Nov 14, 1955, 65 y.o.   MRN: [id number]  HPI  Patient presents the clinic today with complaint of hot flashes, increased sweating and feeling on edge. She noticed this about 6 months ago. She has a history of anxiety and depression, managed on Paroxetine and Seroquel. She went through menopause at 53.   Review of Systems     Past Medical History:  Diagnosis Date   Anxiety    Arthritis    Asthma    Depression    Frequent headaches    GERD (gastroesophageal reflux disease)    Hydrocephalus (HCC) 1995   Migraine    Psoriatic arthritis (HCC)     Current Outpatient Medications  Medication Sig Dispense Refill   QUEtiapine (SEROQUEL) 200 MG tablet TAKE 1 TABLET(200 MG) BY MOUTH AT BEDTIME 90 tablet 1   albuterol (VENTOLIN HFA) 108 (90 Base) MCG/ACT inhaler INHALE 1 TO 2 PUFFS INTO THE LUNGS EVERY 6 HOURS AS NEEDED FOR WHEEZING OR SHORTNESS OF BREATH (Patient taking differently: Inhale 1-2 puffs into the lungs every 6 (six) hours as needed for wheezing or shortness of breath.) 6.7 g 1   azithromycin (ZITHROMAX) 250 MG tablet Take 2 tabs today, then 1 tab daily x 4 days 6 tablet 0   budesonide-formoterol (SYMBICORT) 160-4.5 MCG/ACT inhaler Inhale 2 puffs into the lungs 2 (two) times daily. 1 each 3   fluticasone (FLONASE) 50 MCG/ACT nasal spray Place 2 sprays into both nostrils daily. Use for 4-6 weeks then stop and use seasonally or as needed. 16 g 3   methocarbamol (ROBAXIN) 500 MG tablet Take 500 mg by mouth every 6 (six) hours as needed.     montelukast (SINGULAIR) 10 MG tablet TAKE 1 TABLET(10 MG) BY MOUTH AT BEDTIME 90 tablet 1   pantoprazole (PROTONIX) 40 MG tablet TAKE 1 TABLET(40 MG) BY MOUTH TWICE DAILY (Patient taking differently: Take 40 mg by mouth 2 (two) times daily.) 180 tablet 0   PARoxetine (PAXIL) 40 MG tablet TAKE 1 TABLET(40 MG) BY MOUTH AT BEDTIME 90 tablet 0   promethazine-dextromethorphan (PROMETHAZINE-DM)  6.25-15 MG/5ML syrup Take 5 mLs by mouth 4 (four) times daily as needed for cough. 118 mL 0   traZODone (DESYREL) 50 MG tablet TAKE 1/2 TO 1 TABLET(25 TO 50 MG) BY MOUTH AT BEDTIME AS NEEDED FOR SLEEP 30 tablet 3   verapamil (CALAN-SR) 180 MG CR tablet Take 1 tablet (180 mg total) by mouth at bedtime. 90 tablet 1   No current facility-administered medications for this visit.    Allergies  Allergen Reactions   Amitriptyline Other (See Comments)    hallucinations   Effexor [Venlafaxine] Other (See Comments)    Amnesia, hospitalized x 2 weeks   Haldol [Haloperidol] Other (See Comments)    Paralyzed vocal cords     Family History  Problem Relation Age of Onset   Arthritis Mother    Hyperlipidemia Mother    Heart disease Mother 67       Died of heart failure   Hypertension Mother    Diabetes Mother    Depression Mother    Arthritis Father    Cancer Father        prostate cancer   Diabetes Brother    Breast cancer Maternal Grandmother     Social History   Socioeconomic History   Marital status: Married    Spouse name: [name]   Number of children: 2   Years  of education: 14   Highest education level: Not on file  Occupational History    Employer: OTHER  Tobacco Use   Smoking status: Never   Smokeless tobacco: Never  Vaping Use   Vaping Use: Never used  Substance and Sexual Activity   Alcohol use: No   Drug use: No   Sexual activity: Not Currently    Birth control/protection: Surgical  Other Topics Concern   Not on file  Social History Narrative   Ettel lives at home with her husband, [name], of 5 months and a cat named, Puff. She has 2 sons from a previous marriage. She has 4 grandchildren.    Social Determinants of Health   Financial Resource Strain: Not on file  Food Insecurity: Not on file  Transportation Needs: Not on file  Physical Activity: Not on file  Stress: Not on file  Social Connections: Not on file  Intimate Partner Violence: Not on file      Constitutional: Denies fever, malaise, fatigue, headache or abrupt weight changes.  Respiratory: Denies difficulty breathing, shortness of breath, cough or sputum production.   Cardiovascular: Denies chest pain, chest tightness, palpitations or swelling in the hands or feet.  Gastrointestinal: Denies abdominal pain, bloating, constipation, diarrhea or blood in the stool.  GU: Pt reports amenorrhea. Denies urgency, frequency, pain with urination, burning sensation, blood in urine, odor or discharge. Skin: Denies redness, rashes, lesions or ulcercations.  Neurological: Patient reports hot flashes.  Denies dizziness, difficulty with memory, difficulty with speech or problems with balance and coordination.  Psych: Pt has a history of anxiety and depression. Denies SI/HI.  No other specific complaints in a complete review of systems (except as listed in HPI above).  Objective:   Physical Exam  BP 136/88 (BP Location: Right Arm, Patient Position: Sitting, Cuff Size: Normal)    Pulse 98    Temp 97.9 F (36.6 C) (Temporal)    Resp 17    Ht 5\' 6"  (1.676 m)    Wt 200 lb 3.2 oz (90.8 kg)    SpO2 99%    BMI 32.31 kg/m   Wt Readings from Last 3 Encounters:  03/23/21 197 lb 12.8 oz (89.7 kg)  01/03/21 201 lb (91.2 kg)  12/21/20 199 lb 9.6 oz (90.5 kg)    General: Appears her stated age, obese, in NAD. Skin: Warm, dry and intact.  Cardiovascular: Normal rate. Pulmonary/Chest: Normal effort. Musculoskeletal: No difficulty with gait.  Neurological: Alert and oriented.  Psychiatric: Mood and affect normal. Behavior is normal. Judgment and thought content normal.    BMET    Component Value Date/Time   NA 137 07/28/2020 0527   K 4.1 07/28/2020 0527   CL 106 07/28/2020 0527   CO2 25 07/28/2020 0527   GLUCOSE 122 (H) 07/28/2020 0527   BUN 13 07/28/2020 0527   CREATININE 0.89 07/28/2020 0527   CREATININE 0.90 10/09/2017 1024   CALCIUM 8.6 (L) 07/28/2020 0527   GFRNONAA >60 07/28/2020  0527   GFRNONAA 69 10/09/2017 1024   GFRAA >60 11/20/2018 0408   GFRAA 79 10/09/2017 1024    Lipid Panel     Component Value Date/Time   CHOL 289 (H) 02/20/2017 0841   TRIG 165.0 (H) 02/20/2017 0841   HDL 38.40 (L) 02/20/2017 0841   CHOLHDL 8 02/20/2017 0841   VLDL 33.0 02/20/2017 0841   LDLCALC 218 (H) 02/20/2017 0841    CBC    Component Value Date/Time   WBC 10.4 07/29/2020 0430   RBC  4.02 07/29/2020 0430   HGB 11.6 (L) 07/29/2020 0430   HCT 34.9 (L) 07/29/2020 0430   PLT 302 07/29/2020 0430   MCV 86.8 07/29/2020 0430   MCH 28.9 07/29/2020 0430   MCHC 33.2 07/29/2020 0430   RDW 13.6 07/29/2020 0430   LYMPHSABS 2.2 07/18/2020 1054   MONOABS 0.8 07/18/2020 1054   EOSABS 0.1 07/18/2020 1054   BASOSABS 0.1 07/18/2020 1054    Hgb A1C Lab Results  Component Value Date   HGBA1C 5.5 08/09/2019            Assessment & Plan:   Menopausal Hot Flashes, Sweating, Irritability:  Try black Cohosh or Estroven OTC according to the package directions We will check TSH, estrogen and progesterone today Continue Paroxetine and Seroquel as previously prescribed  We will follow-up after labs are back with further recommendation and treatment plan  [name], NP This visit occurred during the SARS-CoV-2 public health emergency.  Safety protocols were in place, including screening questions prior to the visit, additional usage of staff PPE, and extensive cleaning of exam room while observing appropriate contact time as indicated for disinfecting solutions.

## 2021-04-28 LAB — PROGESTERONE: Progesterone: <0.5 ng/mL

## 2021-04-28 LAB — ESTROGENS, TOTAL: Estrogen: 173.9 pg/mL

## 2021-04-28 LAB — TSH: TSH: 1.21 mIU/L (ref 0.40–4.50)

## 2021-05-01 ENCOUNTER — Encounter: Payer: Medicare Other | Admitting: Internal Medicine

## 2021-05-18 ENCOUNTER — Encounter: Payer: Self-pay | Admitting: Family Medicine

## 2021-05-18 ENCOUNTER — Ambulatory Visit: Payer: Medicare Other | Admitting: Family Medicine

## 2021-05-18 ENCOUNTER — Other Ambulatory Visit: Payer: Self-pay

## 2021-05-18 ENCOUNTER — Telehealth (INDEPENDENT_AMBULATORY_CARE_PROVIDER_SITE_OTHER): Payer: Medicare Other | Admitting: Family Medicine

## 2021-05-18 VITALS — Ht 66.0 in | Wt 200.0 lb

## 2021-05-18 DIAGNOSIS — J9801 Acute bronchospasm: Secondary | ICD-10-CM

## 2021-05-18 DIAGNOSIS — J0110 Acute frontal sinusitis, unspecified: Secondary | ICD-10-CM

## 2021-05-18 MED ORDER — PREDNISONE 10 MG PO TABS: ORAL_TABLET | ORAL | 0 refills | Status: DC

## 2021-05-18 MED ORDER — HYDROCODONE BIT-HOMATROP MBR 5-1.5 MG/5ML PO SOLN: 5.0000 mL | Freq: Three times a day (TID) | ORAL | 0 refills | Status: DC | PRN

## 2021-05-18 MED ORDER — AMOXICILLIN-POT CLAVULANATE 875-125 MG PO TABS
1.0000 | ORAL_TABLET | Freq: Two times a day (BID) | ORAL | 0 refills | Status: DC
Start: 2021-05-18 — End: 2021-09-13

## 2021-05-18 NOTE — Progress Notes (Signed)
Virtual Visit via Telephone The purpose of this virtual visit is to provide medical care while limiting exposure to the novel coronavirus (COVID19) for both patient and office staff.  Consent was obtained for phone visit:  Yes.   Answered questions that patient had about telehealth interaction:  Yes.   I discussed the limitations, risks, security and privacy concerns of performing an evaluation and management service by telephone. I also discussed with the patient that there may be a patient responsible charge related to this service. The patient expressed understanding and agreed to proceed.  Patient Location: Home Provider Location: [facility] (Office)  Participants in virtual visit: - Patient: Michele Newton - CMA: Orinda Kenner, [place] - Provider: Dr Parks Ranger  ---------------------------------------------------------------------- Chief Complaint  Patient presents with   Headache   Sore Throat    S: Reviewed CMA documentation. I have called patient and gathered additional HPI as follows:  Sinus Congestion, Headache, L Ear Ache, Sore Throat  Reports that symptoms started onset 4 days ago, and sore throat started yesterday. She reports no known sick contact, her neighbors have covid positive. She has a home test for COVID19 has not done yet.  - Admits chills and temperature instability Admits thick productive sputum Cough worse at night - History of prior sinus surgery with recurrent sinus infection UTD Flu Shot currently and primary series COVID vaccine and 1 booster.   Denies any body ache, shortness of breath nausea vomiting diarrhea  Past Medical History:  Diagnosis Date   Anxiety    Arthritis    Asthma    Depression    Frequent headaches    GERD (gastroesophageal reflux disease)    Hydrocephalus (HCC) 1995   Migraine    Psoriatic arthritis ([place])    Social History   Tobacco Use   Smoking status: Never   Smokeless tobacco: Never  Vaping Use    Vaping Use: Never used  Substance Use Topics   Alcohol use: No   Drug use: No    Current Outpatient Medications:    amoxicillin-clavulanate (AUGMENTIN) 875-125 MG tablet, Take 1 tablet by mouth 2 (two) times daily., Disp: 20 tablet, Rfl: 0   HYDROcodone bit-homatropine (HYCODAN) 5-1.5 MG/5ML syrup, Take 5 mLs by mouth every 8 (eight) hours as needed for cough., Disp: 120 mL, Rfl: 0   predniSONE (DELTASONE) 10 MG tablet, Take 6 tabs with breakfast Day 1, 5 tabs Day 2, 4 tabs Day 3, 3 tabs Day 4, 2 tabs Day 5, 1 tab Day 6., Disp: 21 tablet, Rfl: 0   QUEtiapine (SEROQUEL) 200 MG tablet, TAKE 1 TABLET(200 MG) BY MOUTH AT BEDTIME, Disp: 90 tablet, Rfl: 1   albuterol (VENTOLIN HFA) 108 (90 Base) MCG/ACT inhaler, INHALE 1 TO 2 PUFFS INTO THE LUNGS EVERY 6 HOURS AS NEEDED FOR WHEEZING OR SHORTNESS OF BREATH (Patient taking differently: Inhale 1-2 puffs into the lungs every 6 (six) hours as needed for wheezing or shortness of breath.), Disp: 6.7 g, Rfl: 1   budesonide-formoterol (SYMBICORT) 160-4.5 MCG/ACT inhaler, Inhale 2 puffs into the lungs 2 (two) times daily., Disp: 1 each, Rfl: 3   fluticasone (FLONASE) 50 MCG/ACT nasal spray, Place 2 sprays into both nostrils daily. Use for 4-6 weeks then stop and use seasonally or as needed., Disp: 16 g, Rfl: 3   methocarbamol (ROBAXIN) 500 MG tablet, Take 500 mg by mouth every 6 (six) hours as needed., Disp: , Rfl:    montelukast (SINGULAIR) 10 MG tablet, TAKE 1 TABLET(10 MG) BY  MOUTH AT BEDTIME, Disp: 90 tablet, Rfl: 1   pantoprazole (PROTONIX) 40 MG tablet, TAKE 1 TABLET(40 MG) BY MOUTH TWICE DAILY (Patient taking differently: Take 40 mg by mouth 2 (two) times daily.), Disp: 180 tablet, Rfl: 0   PARoxetine (PAXIL) 40 MG tablet, TAKE 1 TABLET(40 MG) BY MOUTH AT BEDTIME, Disp: 90 tablet, Rfl: 0   traZODone (DESYREL) 50 MG tablet, TAKE 1/2 TO 1 TABLET(25 TO 50 MG) BY MOUTH AT BEDTIME AS NEEDED FOR SLEEP, Disp: 30 tablet, Rfl: 3   verapamil (CALAN-SR) 180 MG CR  tablet, Take 1 tablet (180 mg total) by mouth at bedtime., Disp: 90 tablet, Rfl: 1  Depression screen [facility] 2/9 03/23/2021 10/25/2020 03/13/2020  Decreased Interest 0 2 0  Down, Depressed, Hopeless 0 2 0  PHQ - 2 Score 0 4 0  Altered sleeping 0 2 1  Tired, decreased energy 1 3 0  Change in appetite 0 0 0  Feeling bad or failure about yourself  0 0 0  Trouble concentrating 0 2 0  Moving slowly or fidgety/restless 0 0 0  Suicidal thoughts 0 0 0  PHQ-9 Score 1 11 1   Difficult doing work/chores - Not difficult at all Not difficult at all  Some recent data might be hidden    GAD 7 : Generalized Anxiety Score 10/25/2020 03/13/2020 11/16/2018 05/12/2018  Nervous, Anxious, on Edge 2 0 2 3  Control/stop worrying 2 0 1 3  Worry too much - different things 2 0 1 1  Trouble relaxing 2 0 1 3  Restless 3 0 0 0  Easily annoyed or irritable 3 1 2 3   Afraid - awful might happen 1 0 1 0  Total GAD 7 Score 15 1 8 13   Anxiety Difficulty Somewhat difficult Not difficult at all - Not difficult at all    -------------------------------------------------------------------------- O: No physical exam performed due to remote telephone encounter.  Lab results reviewed.  Recent Results (from the past 2160 hour(s))  TSH     Status: None   Collection Time: 04/23/21  2:24 PM  Result Value Ref Range   TSH 1.21 0.40 - 4.50 mIU/L  Estrogens, total     Status: None   Collection Time: 04/23/21  2:24 PM  Result Value Ref Range   Estrogen 173.9 pg/mL    Comment: . Reference Ranges for Total Estrogen: .   Follicular Phase        (1-12 days):  90-590 pg/mL   Luteal Phase:     130-460 pg/mL   Postmenopausal:    50-170 pg/mL . The total estrogen assay is not recommended for use in pre-pubertal children.   Progesterone     Status: None   Collection Time: 04/23/21  2:24 PM  Result Value Ref Range   Progesterone <0.5 ng/mL    Comment:             Reference Ranges          Female             Follicular Phase      < 1.0             Luteal Phase      2.6-21.5             Post menopausal      < 0.5             Pregnancy             1st Trimester  4.1-34.0             2nd Trimester    24.0-76.0             3rd Trimester   52.0-302.0     -------------------------------------------------------------------------- A&P:  Problem List Items Addressed This Visit   None Visit Diagnoses     Acute non-recurrent frontal sinusitis    -  Primary   Relevant Medications   amoxicillin-clavulanate (AUGMENTIN) 875-125 MG tablet   predniSONE (DELTASONE) 10 MG tablet   HYDROcodone bit-homatropine (HYCODAN) 5-1.5 MG/5ML syrup   Acute bronchospasm       Relevant Medications   predniSONE (DELTASONE) 10 MG tablet   HYDROcodone bit-homatropine (HYCODAN) 5-1.5 MG/5ML syrup      Consistent with acute frontal sinusitis, likely initially viral URI vs allergic rhinitis component with worsening concern for bacterial infection given new worsening change and current history of recurrent sinusitis with known sinus problems s/p surgery  Plan: Advised that initially may have been viral illness, she should still test for COVID within 5 day window and consider Flu test. Otherwise can treat symptoms Will proceed w/ plan heading into weekend with Augmentin antibiotic as she has done for sinus infection in past W/ Asthma history will add prednisone taper ONLY IF NEEDED, otherwise HOLD Rx Hycodan cough syrup Keep Flonase Return criteria reviewed     Meds ordered this encounter  Medications   amoxicillin-clavulanate (AUGMENTIN) 875-125 MG tablet    Sig: Take 1 tablet by mouth 2 (two) times daily.    Dispense:  20 tablet    Refill:  0   predniSONE (DELTASONE) 10 MG tablet    Sig: Take 6 tabs with breakfast Day 1, 5 tabs Day 2, 4 tabs Day 3, 3 tabs Day 4, 2 tabs Day 5, 1 tab Day 6.    Dispense:  21 tablet    Refill:  0   HYDROcodone bit-homatropine (HYCODAN) 5-1.5 MG/5ML syrup    Sig: Take 5 mLs by mouth every 8  (eight) hours as needed for cough.    Dispense:  120 mL    Refill:  0    Follow-up: - Return in 1 week if unresolved  Patient verbalizes understanding with the above medical recommendations including the limitation of remote medical advice.  Specific follow-up and call-back criteria were given for patient to follow-up or seek medical care more urgently if needed.   - Time spent in direct consultation with patient on phone: 11 minutes  Nobie Putnam, [facility] 05/18/2021, 8:14 AM

## 2021-05-18 NOTE — Patient Instructions (Signed)
Thank you for coming to the office today.  Start augmentin Cough syrup Prednisone only if needed  IF you test positive for COVID now or in future  [facility] and state that your physician has referred them to speak to them for COVID Anti viral treatment. The pharmacist will discuss the criteria and review symptoms and if meets criteria, they can prescribe it for them.  [facility], [address] [place], [place] [address]    Please schedule a Follow-up Appointment to: No follow-ups on file.  If you have any other questions or concerns, please feel free to call the office or send a message through [place]. You may also schedule an earlier appointment if necessary.  Additionally, you may be receiving a survey about your experience at our office within a few days to 1 week by e-mail or mail. We value your feedback.  Nobie Putnam, DO [place]

## 2021-06-25 DIAGNOSIS — M25562 Pain in left knee: Secondary | ICD-10-CM | POA: Diagnosis not present

## 2021-06-25 DIAGNOSIS — M1712 Unilateral primary osteoarthritis, left knee: Secondary | ICD-10-CM | POA: Diagnosis not present

## 2021-06-25 DIAGNOSIS — M47816 Spondylosis without myelopathy or radiculopathy, lumbar region: Secondary | ICD-10-CM | POA: Diagnosis not present

## 2021-07-03 ENCOUNTER — Other Ambulatory Visit: Payer: Self-pay | Admitting: Physical Medicine & Rehabilitation

## 2021-07-03 DIAGNOSIS — G8929 Other chronic pain: Secondary | ICD-10-CM | POA: Diagnosis not present

## 2021-07-03 DIAGNOSIS — M533 Sacrococcygeal disorders, not elsewhere classified: Secondary | ICD-10-CM | POA: Diagnosis not present

## 2021-07-03 DIAGNOSIS — M5441 Lumbago with sciatica, right side: Secondary | ICD-10-CM | POA: Diagnosis not present

## 2021-07-04 ENCOUNTER — Ambulatory Visit
Admission: RE | Admit: 2021-07-04 | Discharge: 2021-07-04 | Disposition: A | Discharge status: Home / Self Care | Payer: Medicare HMO | Source: Ambulatory Visit | Attending: Physical Medicine & Rehabilitation | Admitting: Physical Medicine & Rehabilitation

## 2021-07-04 DIAGNOSIS — G8929 Other chronic pain: Secondary | ICD-10-CM | POA: Diagnosis not present

## 2021-07-04 DIAGNOSIS — M5441 Lumbago with sciatica, right side: Secondary | ICD-10-CM | POA: Diagnosis not present

## 2021-07-08 ENCOUNTER — Other Ambulatory Visit: Payer: Self-pay | Admitting: Internal Medicine

## 2021-07-08 DIAGNOSIS — F5104 Psychophysiologic insomnia: Secondary | ICD-10-CM

## 2021-07-08 DIAGNOSIS — F419 Anxiety disorder, unspecified: Secondary | ICD-10-CM

## 2021-07-09 NOTE — Telephone Encounter (Signed)
Requested Prescriptions  ?Pending Prescriptions Disp Refills  ?? verapamil (CALAN-SR) 180 MG CR tablet [Pharmacy Med Name: VERAPAMIL ER '180MG'$  TABLETS] 90 tablet 0  ?  Sig: TAKE 1 TABLET(180 MG) BY MOUTH AT BEDTIME  ?  ? Cardiovascular: Calcium Channel Blockers 3 Passed - 07/08/2021  7:14 AM  ?  ?  Passed - ALT in normal range and within 360 days  ?  ALT  ?Date Value Ref Range Status  ?07/18/2020 32 0 - 44 U/L Final  ?   ?  ?  Passed - AST in normal range and within 360 days  ?  AST  ?Date Value Ref Range Status  ?07/18/2020 24 15 - 41 U/L Final  ?   ?  ?  Passed - Cr in normal range and within 360 days  ?  Creat  ?Date Value Ref Range Status  ?10/09/2017 0.90 0.50 - 0.99 mg/dL Final  ?  Comment:  ?  For patients >68 years of age, the reference limit ?for Creatinine is approximately 13% higher for people ?identified as African-American. ?. ?  ? ?Creatinine, Ser  ?Date Value Ref Range Status  ?07/28/2020 0.89 0.44 - 1.00 mg/dL Final  ?   ?  ?  Passed - Last BP in normal range  ?  BP Readings from Last 1 Encounters:  ?04/23/21 136/88  ?   ?  ?  Passed - Last Heart Rate in normal range  ?  Pulse Readings from Last 1 Encounters:  ?04/23/21 98  ?   ?  ?  Passed - Valid encounter within last 6 months  ?  Recent Outpatient Visits   ?      ? 1 month ago Acute non-recurrent frontal sinusitis  ? [place], DO  ? 2 months ago Hot flashes due to menopause  ? [facility] [place], [place] W, NP  ? 3 months ago Acute nasopharyngitis  ? [facility] [place], [place] W, NP  ? 3 months ago Moderate persistent asthma with acute exacerbation  ? [facility] [place], [place] W, NP  ? 6 months ago Essential hypertension  ? [facility] [place], [place] W, NP  ?  ?  ? ?  ?  ?  ?? traZODone (DESYREL) 50 MG tablet [Pharmacy Med Name: TRAZODONE '50MG'$  TABLETS] 30 tablet 2  ?  Sig: TAKE 1/2 TO 1 TABLET(25 TO 50 MG) BY MOUTH AT BEDTIME AS NEEDED FOR SLEEP  ?   ? Psychiatry: Antidepressants - Serotonin Modulator Passed - 07/08/2021  7:14 AM  ?  ?  Passed - Completed PHQ-2 or PHQ-9 in the last 360 days  ?  ?  Passed - Valid encounter within last 6 months  ?  Recent Outpatient Visits   ?      ? 1 month ago Acute non-recurrent frontal sinusitis  ? [place], DO  ? 2 months ago Hot flashes due to menopause  ? [facility] [place], [place] W, NP  ? 3 months ago Acute nasopharyngitis  ? [facility] [place], [place] W, NP  ? 3 months ago Moderate persistent asthma with acute exacerbation  ? [facility] [place], [place] W, NP  ? 6 months ago Essential hypertension  ? [facility] [place], [name], NP  ?  ?  ? ?  ?  ?  ?? PARoxetine (PAXIL) 40 MG tablet [Pharmacy  Med Name: PAROXETINE '40MG'$  TABLETS] 90 tablet 0  ?  Sig: TAKE 1 TABLET(40 MG) BY MOUTH AT BEDTIME  ?  ? Psychiatry:  Antidepressants - SSRI Passed - 07/08/2021  7:14 AM  ?  ?  Passed - Completed PHQ-2 or PHQ-9 in the last 360 days  ?  ?  Passed - Valid encounter within last 6 months  ?  Recent Outpatient Visits   ?      ? 1 month ago Acute non-recurrent frontal sinusitis  ? [place], DO  ? 2 months ago Hot flashes due to menopause  ? [facility] [place], [place] W, NP  ? 3 months ago Acute nasopharyngitis  ? [facility] [place], [place] W, NP  ? 3 months ago Moderate persistent asthma with acute exacerbation  ? [facility] [place], [place] W, NP  ? 6 months ago Essential hypertension  ? [facility] [place], [name], NP  ?  ?  ? ?  ?  ?  ? ?

## 2021-07-10 DIAGNOSIS — M533 Sacrococcygeal disorders, not elsewhere classified: Secondary | ICD-10-CM | POA: Diagnosis not present

## 2021-07-10 DIAGNOSIS — M5117 Intervertebral disc disorders with radiculopathy, lumbosacral region: Secondary | ICD-10-CM | POA: Diagnosis not present

## 2021-07-10 DIAGNOSIS — G8929 Other chronic pain: Secondary | ICD-10-CM | POA: Diagnosis not present

## 2021-07-16 DIAGNOSIS — M5117 Intervertebral disc disorders with radiculopathy, lumbosacral region: Secondary | ICD-10-CM | POA: Diagnosis not present

## 2021-07-24 DIAGNOSIS — H1045 Other chronic allergic conjunctivitis: Secondary | ICD-10-CM | POA: Diagnosis not present

## 2021-07-24 DIAGNOSIS — H5203 Hypermetropia, bilateral: Secondary | ICD-10-CM | POA: Diagnosis not present

## 2021-07-24 DIAGNOSIS — H26493 Other secondary cataract, bilateral: Secondary | ICD-10-CM | POA: Diagnosis not present

## 2021-07-24 DIAGNOSIS — H01009 Unspecified blepharitis unspecified eye, unspecified eyelid: Secondary | ICD-10-CM | POA: Diagnosis not present

## 2021-07-31 DIAGNOSIS — M533 Sacrococcygeal disorders, not elsewhere classified: Secondary | ICD-10-CM | POA: Diagnosis not present

## 2021-07-31 DIAGNOSIS — M48062 Spinal stenosis, lumbar region with neurogenic claudication: Secondary | ICD-10-CM | POA: Diagnosis not present

## 2021-07-31 DIAGNOSIS — M5441 Lumbago with sciatica, right side: Secondary | ICD-10-CM | POA: Diagnosis not present

## 2021-07-31 DIAGNOSIS — G8929 Other chronic pain: Secondary | ICD-10-CM | POA: Diagnosis not present

## 2021-08-01 DIAGNOSIS — Z96641 Presence of right artificial hip joint: Secondary | ICD-10-CM | POA: Diagnosis not present

## 2021-08-01 DIAGNOSIS — M7061 Trochanteric bursitis, right hip: Secondary | ICD-10-CM | POA: Diagnosis not present

## 2021-08-01 DIAGNOSIS — M1611 Unilateral primary osteoarthritis, right hip: Secondary | ICD-10-CM | POA: Diagnosis not present

## 2021-08-07 ENCOUNTER — Other Ambulatory Visit: Payer: Self-pay | Admitting: Internal Medicine

## 2021-08-07 DIAGNOSIS — J4520 Mild intermittent asthma, uncomplicated: Secondary | ICD-10-CM

## 2021-08-08 NOTE — Telephone Encounter (Signed)
Requested Prescriptions  ?Pending Prescriptions Disp Refills  ?? montelukast (SINGULAIR) 10 MG tablet [Pharmacy Med Name: MONTELUKAST '10MG'$  TABLETS] 90 tablet 0  ?  Sig: TAKE 1 TABLET(10 MG) BY MOUTH AT BEDTIME  ?  ? Pulmonology:  Leukotriene Inhibitors Passed - 08/07/2021  7:03 AM  ?  ?  Passed - Valid encounter within last 12 months  ?  Recent Outpatient Visits   ?      ? 2 months ago Acute non-recurrent frontal sinusitis  ? [place], DO  ? 3 months ago Hot flashes due to menopause  ? [facility] [place], [place] W, NP  ? 4 months ago Acute nasopharyngitis  ? [facility] [place], [place] W, NP  ? 4 months ago Moderate persistent asthma with acute exacerbation  ? [facility] [place], [place] W, NP  ? 7 months ago Essential hypertension  ? [facility] [place], [name], NP  ?  ?  ? ?  ?  ?  ? ? ?

## 2021-09-14 ENCOUNTER — Ambulatory Visit (INDEPENDENT_AMBULATORY_CARE_PROVIDER_SITE_OTHER): Payer: Medicare HMO

## 2021-09-14 VITALS — Wt 200.0 lb

## 2021-09-14 DIAGNOSIS — Z0000 Encounter for general adult medical examination without abnormal findings: Secondary | ICD-10-CM | POA: Diagnosis not present

## 2021-09-14 NOTE — Progress Notes (Signed)
?Virtual Visit via Telephone Note ? ?I connected with  Michele Newton on 09/14/21 at  8:45 AM EDT by telephone and verified that I am speaking with the correct person using two identifiers. ? ?Location: ?Patient: home ?Provider: [facility] ?Persons participating in the virtual visit: patient/Nurse Health Advisor ?  ?I discussed the limitations, risks, security and privacy concerns of performing an evaluation and management service by telephone and the availability of in person appointments. The patient expressed understanding and agreed to proceed. ? ?Interactive audio and video telecommunications were attempted between this nurse and patient, however failed, due to patient having technical difficulties OR patient did not have access to video capability.  We continued and completed visit with audio only. ? ?Some vital signs may be absent or patient reported.  ? ?Dionisio David, LPN ? ?Subjective:  ? Michele Newton is a 66 y.o. female who presents for Medicare Annual (Subsequent) preventive examination. ? ?Review of Systems    ? ?  ? ?   ?Objective:  ?  ?There were no vitals filed for this visit. ?There is no height or weight on file to calculate BMI. ? ? ?  07/27/2020  ? 11:06 AM 07/18/2020  ? 10:22 AM 11/19/2018  ?  5:25 PM 11/11/2018  ? 12:54 PM 07/22/2018  ?  3:31 PM 03/20/2017  ?  9:55 AM 03/06/2017  ?  8:48 AM  ?Advanced Directives  ?Does Patient Have a Medical Advance Directive? No No No No No No No  ?Would patient like information on creating a medical advance directive? No - Patient declined Yes (MAU/Ambulatory/Procedural Areas - Information given) No - Patient declined No - Patient declined No - Patient declined  No - Patient declined  ? ? ?Current Medications (verified) ?Outpatient Encounter Medications as of 09/14/2021  ?Medication Sig  ? gabapentin (NEURONTIN) 300 MG capsule Take 1 capsule by mouth 3 (three) times daily.  ? neomycin-polymyxin-dexamethasone (MAXITROL) 0.1 % ophthalmic suspension Apply to eye.  ? oxyCODONE  (OXY IR/ROXICODONE) 5 MG immediate release tablet Take by mouth.  ? QUEtiapine (SEROQUEL) 200 MG tablet TAKE 1 TABLET(200 MG) BY MOUTH AT BEDTIME  ? albuterol (VENTOLIN HFA) 108 (90 Base) MCG/ACT inhaler INHALE 1 TO 2 PUFFS INTO THE LUNGS EVERY 6 HOURS AS NEEDED FOR WHEEZING OR SHORTNESS OF BREATH (Patient taking differently: Inhale 1-2 puffs into the lungs every 6 (six) hours as needed for wheezing or shortness of breath.)  ? budesonide-formoterol (SYMBICORT) 160-4.5 MCG/ACT inhaler Inhale 2 puffs into the lungs 2 (two) times daily.  ? fluticasone (FLONASE) 50 MCG/ACT nasal spray Place 2 sprays into both nostrils daily. Use for 4-6 weeks then stop and use seasonally or as needed.  ? HYDROcodone bit-homatropine (HYCODAN) 5-1.5 MG/5ML syrup Take 5 mLs by mouth every 8 (eight) hours as needed for cough.  ? losartan (COZAAR) 50 MG tablet Take 50 mg by mouth daily.  ? methocarbamol (ROBAXIN) 500 MG tablet Take 500 mg by mouth every 6 (six) hours as needed.  ? montelukast (SINGULAIR) 10 MG tablet TAKE 1 TABLET(10 MG) BY MOUTH AT BEDTIME  ? pantoprazole (PROTONIX) 40 MG tablet TAKE 1 TABLET(40 MG) BY MOUTH TWICE DAILY (Patient taking differently: Take 40 mg by mouth 2 (two) times daily.)  ? PARoxetine (PAXIL) 40 MG tablet TAKE 1 TABLET(40 MG) BY MOUTH AT BEDTIME  ? traZODone (DESYREL) 50 MG tablet TAKE 1/2 TO 1 TABLET(25 TO 50 MG) BY MOUTH AT BEDTIME AS NEEDED FOR SLEEP  ? verapamil (CALAN-SR) 180 MG CR tablet TAKE  1 TABLET(180 MG) BY MOUTH AT BEDTIME  ? [DISCONTINUED] amoxicillin-clavulanate (AUGMENTIN) 875-125 MG tablet Take 1 tablet by mouth 2 (two) times daily.  ? [DISCONTINUED] predniSONE (DELTASONE) 10 MG tablet Take 6 tabs with breakfast Day 1, 5 tabs Day 2, 4 tabs Day 3, 3 tabs Day 4, 2 tabs Day 5, 1 tab Day 6.  ? ?No facility-administered encounter medications on file as of 09/14/2021.  ? ? ?Allergies (verified) ?Amitriptyline, Effexor [venlafaxine], and Haldol [haloperidol]  ? ?History: ?Past Medical History:   ?Diagnosis Date  ? Anxiety   ? Arthritis   ? Asthma   ? Depression   ? Frequent headaches   ? GERD (gastroesophageal reflux disease)   ? Hydrocephalus ([place]) 1995  ? Migraine   ? Psoriatic arthritis ([place])   ? ?Past Surgical History:  ?Procedure Laterality Date  ? BREAST SURGERY  2000  ? biopsy  ? NASAL SINUS SURGERY  1998  ? TONSILLECTOMY AND ADENOIDECTOMY  1985  ? 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 Procedure: TOTAL HIP ARTHROPLASTY ANTERIOR APPROACH;  Surgeon: Hessie Knows, MD;  Location: [facility] ORS;  Service: Orthopedics;  Laterality: Left;  ? TOTAL HIP ARTHROPLASTY Right 07/27/2020  ? Procedure: TOTAL HIP ARTHROPLASTY ANTERIOR APPROACH;  Surgeon: Hessie Knows, MD;  Location: [facility] ORS;  Service: Orthopedics;  Laterality: Right;  ? TUBAL LIGATION    ? TYMPANOSTOMY TUBE PLACEMENT  1998  ? VENTRICULAR ATRIAL SHUNT Right 1995  ? ?Family History  ?Problem Relation Age of Onset  ? Arthritis Mother   ? Hyperlipidemia Mother   ? Heart disease Mother 20  ?     Died of heart failure  ? Hypertension Mother   ? Diabetes Mother   ? Depression Mother   ? Arthritis Father   ? Cancer Father   ?     prostate cancer  ? Diabetes Brother   ? Breast cancer Maternal Grandmother   ? ?Social History  ? ?Socioeconomic History  ? Marital status: Married  ?  Spouse name: [name]  ? Number of children: 2  ? Years of education: 19  ? Highest education level: Not on file  ?Occupational History  ?  Employer: OTHER  ?Tobacco Use  ? Smoking status: Never  ? Smokeless tobacco: Never  ?Vaping Use  ? Vaping Use: Never used  ?Substance and Sexual Activity  ? Alcohol use: No  ? Drug use: No  ? Sexual activity: Not Currently  ?  Birth control/protection: Surgical  ?Other Topics Concern  ? Not on file  ?Social History Narrative  ? Artelia lives at home with her husband, [name], of 5 months and a cat named, Puff. She has 2 sons from a previous marriage. She has 4 grandchildren.   ? ?Social Determinants of Health  ? ?Financial Resource Strain: Not on file   ?Food Insecurity: Not on file  ?Transportation Needs: Not on file  ?Physical Activity: Not on file  ?Stress: Not on file  ?Social Connections: Not on file  ? ? ?Tobacco Counseling ?Counseling given: Not Answered ? ? ?Clinical Intake: ? ?Pre-visit preparation completed: Yes ? ?Pain : No/denies pain ? ?  ? ?Nutritional Risks: None ?Diabetes: No ? ?How often do you need to have someone help you when you read instructions, pamphlets, or other written materials from your doctor or pharmacy?: 1 - Never ? ?Diabetic?no ? ?Interpreter Needed?: No ? ?Information entered by :: Kirke Shaggy, LPN ? ? ?Activities of Daily Living ?   ? View : No data to  display.  ?  ?  ?  ? ? ?Patient Care Team: ?Jearld Fenton, NP as PCP - General (Internal Medicine) ? ?Indicate any recent Medical Services you may have received from other than [facility] providers in the past year (date may be approximate). ? ?   ?Assessment:  ? This is a routine wellness examination for Michele Newton. ? ?Hearing/Vision screen ?No results found. ? ?Dietary issues and exercise activities discussed: ?  ? ? Goals Addressed   ?None ?  ? ?Depression Screen ? ?  03/23/2021  ?  1:25 PM 10/25/2020  ? 10:29 AM 03/13/2020  ?  3:06 PM 09/15/2019  ? 11:06 AM 03/25/2019  ?  1:54 PM 11/16/2018  ? 10:34 AM 05/12/2018  ?  1:21 PM  ?PHQ 2/9 Scores  ?PHQ - 2 Score 0 4 0 0 0 2 6  ?PHQ- 9 Score '1 11 1 '$ 0  13 12  ?  ?Fall Risk ? ?  03/13/2020  ?  3:05 PM 11/16/2018  ? 10:34 AM 07/02/2016  ?  2:50 PM  ?Fall Risk   ?Falls in the past year? 0 0 Yes  ?Number falls in past yr:   1  ?Injury with Fall?   Yes  ?Follow up Falls evaluation completed Falls evaluation completed   ? ? ?FALL RISK PREVENTION PERTAINING TO THE HOME: ? ?Any stairs in or around the home? Yes  ?If so, are there any without handrails? No  ?Home free of loose throw rugs in walkways, pet beds, electrical cords, etc? Yes  ?Adequate lighting in your home to reduce risk of falls? Yes  ? ?ASSISTIVE DEVICES UTILIZED TO PREVENT FALLS: ? ?Life  alert? No  ?Use of a cane, walker or w/c? Yes  ?Grab bars in the bathroom? Yes  ?Shower chair or bench in shower? Yes  ?Elevated toilet seat or a handicapped toilet? Yes  ? ?Cognitive Function: ?  ?  ?  ? ?Imm

## 2021-09-14 NOTE — Patient Instructions (Signed)
Ms. Jaso , ?Thank you for taking time to come for your Medicare Wellness Visit. I appreciate your ongoing commitment to your health goals. Please review the following plan we discussed and let me know if I can assist you in the future.  ? ?Screening recommendations/referrals: ?Colonoscopy: declined referral ?Mammogram: declined referral ?Bone Density: declined referral ?Recommended yearly ophthalmology/optometry visit for glaucoma screening and checkup ?Recommended yearly dental visit for hygiene and checkup ? ?Vaccinations: ?Influenza vaccine: 04/23/21 ?Pneumococcal vaccine: 04/23/21, needs second shot ?Tdap vaccine: 01/04/10, due ?Shingles vaccine: n/d   ?Covid-19:07/04/19, 07/26/19 ? ?Advanced directives: no ? ?Conditions/risks identified: none ? ?Next appointment: Follow up in one year for your annual wellness visit 09/20/22 @ 8:15am by phone ? ? ?Preventive Care 67 Years and Older, Female ?Preventive care refers to lifestyle choices and visits with your health care provider that can promote health and wellness. ?What does preventive care include? ?A yearly physical exam. This is also called an annual well check. ?Dental exams once or twice a year. ?Routine eye exams. Ask your health care provider how often you should have your eyes checked. ?Personal lifestyle choices, including: ?Daily care of your teeth and gums. ?Regular physical activity. ?Eating a healthy diet. ?Avoiding tobacco and drug use. ?Limiting alcohol use. ?Practicing safe sex. ?Taking low-dose aspirin every day. ?Taking vitamin and mineral supplements as recommended by your health care provider. ?What happens during an annual well check? ?The services and screenings done by your health care provider during your annual well check will depend on your age, overall health, lifestyle risk factors, and family history of disease. ?Counseling  ?Your health care provider may ask you questions about your: ?Alcohol use. ?Tobacco use. ?Drug use. ?Emotional  well-being. ?Home and relationship well-being. ?Sexual activity. ?Eating habits. ?History of falls. ?Memory and ability to understand (cognition). ?Work and work Statistician. ?Reproductive health. ?Screening  ?You may have the following tests or measurements: ?Height, weight, and BMI. ?Blood pressure. ?Lipid and cholesterol levels. These may be checked every 5 years, or more frequently if you are over 67 years old. ?Skin check. ?Lung cancer screening. You may have this screening every year starting at age 67 if you have a 30-pack-year history of smoking and currently smoke or have quit within the past 15 years. ?Fecal occult blood test (FOBT) of the stool. You may have this test every year starting at age 67. ?Flexible sigmoidoscopy or colonoscopy. You may have a sigmoidoscopy every 5 years or a colonoscopy every 10 years starting at age 67. ?Hepatitis C blood test. ?Hepatitis B blood test. ?Sexually transmitted disease (STD) testing. ?Diabetes screening. This is done by checking your blood sugar (glucose) after you have not eaten for a while (fasting). You may have this done every 1-3 years. ?Bone density scan. This is done to screen for osteoporosis. You may have this done starting at age 67. ?Mammogram. This may be done every 1-2 years. Talk to your health care provider about how often you should have regular mammograms. ?Talk with your health care provider about your test results, treatment options, and if necessary, the need for more tests. ?Vaccines  ?Your health care provider may recommend certain vaccines, such as: ?Influenza vaccine. This is recommended every year. ?Tetanus, diphtheria, and acellular pertussis (Tdap, Td) vaccine. You may need a Td booster every 10 years. ?Zoster vaccine. You may need this after age 67. ?Pneumococcal 13-valent conjugate (PCV13) vaccine. One dose is recommended after age 67. ?Pneumococcal polysaccharide (PPSV23) vaccine. One dose is recommended after age 67. ?  Talk to your  health care provider about which screenings and vaccines you need and how often you need them. ?This information is not intended to replace advice given to you by your health care provider. Make sure you discuss any questions you have with your health care provider. ?Document Released: 05/19/2015 Document Revised: 01/10/2016 Document Reviewed: 02/21/2015 ?Elsevier Interactive Patient Education ? 2017 [place]. ? ?Fall Prevention in the Home ?Falls can cause injuries. They can happen to people of all ages. There are many things you can do to make your home safe and to help prevent falls. ?What can I do on the outside of my home? ?Regularly fix the edges of walkways and driveways and fix any cracks. ?Remove anything that might make you trip as you walk through a door, such as a raised step or threshold. ?Trim any bushes or trees on the path to your home. ?Use bright outdoor lighting. ?Clear any walking paths of anything that might make someone trip, such as rocks or tools. ?Regularly check to see if handrails are loose or broken. Make sure that both sides of any steps have handrails. ?Any raised decks and porches should have guardrails on the edges. ?Have any leaves, snow, or ice cleared regularly. ?Use sand or salt on walking paths during winter. ?Clean up any spills in your garage right away. This includes oil or grease spills. ?What can I do in the bathroom? ?Use night lights. ?Install grab bars by the toilet and in the tub and shower. Do not use towel bars as grab bars. ?Use non-skid mats or decals in the tub or shower. ?If you need to sit down in the shower, use a plastic, non-slip stool. ?Keep the floor dry. Clean up any water that spills on the floor as soon as it happens. ?Remove soap buildup in the tub or shower regularly. ?Attach bath mats securely with double-sided non-slip rug tape. ?Do not have throw rugs and other things on the floor that can make you trip. ?What can I do in the bedroom? ?Use night  lights. ?Make sure that you have a light by your bed that is easy to reach. ?Do not use any sheets or blankets that are too big for your bed. They should not hang down onto the floor. ?Have a firm chair that has side arms. You can use this for support while you get dressed. ?Do not have throw rugs and other things on the floor that can make you trip. ?What can I do in the kitchen? ?Clean up any spills right away. ?Avoid walking on wet floors. ?Keep items that you use a lot in easy-to-reach places. ?If you need to reach something above you, use a strong step stool that has a grab bar. ?Keep electrical cords out of the way. ?Do not use floor polish or wax that makes floors slippery. If you must use wax, use non-skid floor wax. ?Do not have throw rugs and other things on the floor that can make you trip. ?What can I do with my stairs? ?Do not leave any items on the stairs. ?Make sure that there are handrails on both sides of the stairs and use them. Fix handrails that are broken or loose. Make sure that handrails are as long as the stairways. ?Check any carpeting to make sure that it is firmly attached to the stairs. Fix any carpet that is loose or worn. ?Avoid having throw rugs at the top or bottom of the stairs. If you do have throw  rugs, attach them to the floor with carpet tape. ?Make sure that you have a light switch at the top of the stairs and the bottom of the stairs. If you do not have them, ask someone to add them for you. ?What else can I do to help prevent falls? ?Wear shoes that: ?Do not have high heels. ?Have rubber bottoms. ?Are comfortable and fit you well. ?Are closed at the toe. Do not wear sandals. ?If you use a stepladder: ?Make sure that it is fully opened. Do not climb a closed stepladder. ?Make sure that both sides of the stepladder are locked into place. ?Ask someone to hold it for you, if possible. ?Clearly mark and make sure that you can see: ?Any grab bars or handrails. ?First and last  steps. ?Where the edge of each step is. ?Use tools that help you move around (mobility aids) if they are needed. These include: ?Canes. ?Walkers. ?Scooters. ?Crutches. ?Turn on the lights when you go into a dark are

## 2021-09-19 DIAGNOSIS — M25551 Pain in right hip: Secondary | ICD-10-CM | POA: Diagnosis not present

## 2021-09-19 DIAGNOSIS — Z96641 Presence of right artificial hip joint: Secondary | ICD-10-CM | POA: Diagnosis not present

## 2021-09-19 DIAGNOSIS — G8929 Other chronic pain: Secondary | ICD-10-CM | POA: Diagnosis not present

## 2021-09-24 ENCOUNTER — Ambulatory Visit (INDEPENDENT_AMBULATORY_CARE_PROVIDER_SITE_OTHER): Payer: Medicare HMO | Admitting: Internal Medicine

## 2021-09-24 ENCOUNTER — Encounter: Payer: Self-pay | Admitting: Internal Medicine

## 2021-09-24 VITALS — BP 124/72 | HR 75 | Temp 97.1°F | Wt 206.0 lb

## 2021-09-24 DIAGNOSIS — K219 Gastro-esophageal reflux disease without esophagitis: Secondary | ICD-10-CM

## 2021-09-24 DIAGNOSIS — R1310 Dysphagia, unspecified: Secondary | ICD-10-CM | POA: Diagnosis not present

## 2021-09-24 MED ORDER — PANTOPRAZOLE SODIUM 40 MG PO TBEC: 40.0000 mg | DELAYED_RELEASE_TABLET | Freq: Two times a day (BID) | ORAL | 0 refills | Status: DC

## 2021-09-24 NOTE — Progress Notes (Signed)
Subjective:    Patient ID: Michele Newton, female    DOB: 07/13/1955, 66 y.o.   MRN: [id number]  HPI  Pt presents to the clinic today with c/o worsening GERD and dysphagia. She noticed this over the last 1-2 months. She does feel like her food gets "stuck" at times. She usually has to vomit to get this feeling to go away.  She has been having a lot of nausea as well.  She ran out of her Pantoprazole. She has taken Tums as needed.  Review of Systems     Past Medical History:  Diagnosis Date   Anxiety    Arthritis    Asthma    Depression    Frequent headaches    GERD (gastroesophageal reflux disease)    Hydrocephalus (HCC) 1995   Migraine    Psoriatic arthritis (HCC)     Current Outpatient Medications  Medication Sig Dispense Refill   albuterol (VENTOLIN HFA) 108 (90 Base) MCG/ACT inhaler INHALE 1 TO 2 PUFFS INTO THE LUNGS EVERY 6 HOURS AS NEEDED FOR WHEEZING OR SHORTNESS OF BREATH (Patient taking differently: Inhale 1-2 puffs into the lungs every 6 (six) hours as needed for wheezing or shortness of breath.) 6.7 g 1   budesonide-formoterol (SYMBICORT) 160-4.5 MCG/ACT inhaler Inhale 2 puffs into the lungs 2 (two) times daily. 1 each 3   fluticasone (FLONASE) 50 MCG/ACT nasal spray Place 2 sprays into both nostrils daily. Use for 4-6 weeks then stop and use seasonally or as needed. 16 g 3   gabapentin (NEURONTIN) 300 MG capsule Take 1 capsule by mouth 3 (three) times daily.     HYDROcodone bit-homatropine (HYCODAN) 5-1.5 MG/5ML syrup Take 5 mLs by mouth every 8 (eight) hours as needed for cough. (Patient not taking: Reported on 09/14/2021) 120 mL 0   losartan (COZAAR) 50 MG tablet Take 50 mg by mouth daily. (Patient not taking: Reported on 09/14/2021)     methocarbamol (ROBAXIN) 500 MG tablet Take 500 mg by mouth every 6 (six) hours as needed.     montelukast (SINGULAIR) 10 MG tablet TAKE 1 TABLET(10 MG) BY MOUTH AT BEDTIME (Patient not taking: Reported on 09/14/2021) 90 tablet 0    neomycin-polymyxin-dexamethasone (MAXITROL) 0.1 % ophthalmic suspension Apply to eye. (Patient not taking: Reported on 09/14/2021)     oxyCODONE (OXY IR/ROXICODONE) 5 MG immediate release tablet Take by mouth.     pantoprazole (PROTONIX) 40 MG tablet TAKE 1 TABLET(40 MG) BY MOUTH TWICE DAILY (Patient taking differently: Take 40 mg by mouth 2 (two) times daily.) 180 tablet 0   PARoxetine (PAXIL) 40 MG tablet TAKE 1 TABLET(40 MG) BY MOUTH AT BEDTIME 90 tablet 0   QUEtiapine (SEROQUEL) 200 MG tablet TAKE 1 TABLET(200 MG) BY MOUTH AT BEDTIME 90 tablet 1   traZODone (DESYREL) 50 MG tablet TAKE 1/2 TO 1 TABLET(25 TO 50 MG) BY MOUTH AT BEDTIME AS NEEDED FOR SLEEP 30 tablet 2   verapamil (CALAN-SR) 180 MG CR tablet TAKE 1 TABLET(180 MG) BY MOUTH AT BEDTIME 90 tablet 0   No current facility-administered medications for this visit.    Allergies  Allergen Reactions   Amitriptyline Other (See Comments)    hallucinations   Effexor [Venlafaxine] Other (See Comments)    Amnesia, hospitalized x 2 weeks   Haldol [Haloperidol] Other (See Comments)    Paralyzed vocal cords     Family History  Problem Relation Age of Onset   Arthritis Mother    Hyperlipidemia Mother    Heart disease  Mother 63       Died of heart failure   Hypertension Mother    Diabetes Mother    Depression Mother    Arthritis Father    Cancer Father        prostate cancer   Diabetes Brother    Breast cancer Maternal Grandmother     Social History   Socioeconomic History   Marital status: Married    Spouse name: [name]   Number of children: 2   Years of education: 14   Highest education level: Not on file  Occupational History    Employer: OTHER  Tobacco Use   Smoking status: Never   Smokeless tobacco: Never  Vaping Use   Vaping Use: Never used  Substance and Sexual Activity   Alcohol use: No   Drug use: No   Sexual activity: Not Currently    Birth control/protection: Surgical  Other Topics Concern   Not on file   Social History Narrative   Winola lives at home with her husband, [name], of 5 months and a cat named, Puff. She has 2 sons from a previous marriage. She has 4 grandchildren.    Social Determinants of Health   Financial Resource Strain: Low Risk    Difficulty of Paying Living Expenses: Not hard at all  Food Insecurity: No Food Insecurity   Worried About Charity fundraiser in the Last Year: Never true   [place] in the Last Year: Never true  Transportation Needs: No Transportation Needs   Lack of Transportation (Medical): No   Lack of Transportation (Non-Medical): No  Physical Activity: Insufficiently Active   Days of Exercise per Week: 2 days   Minutes of Exercise per Session: 20 min  Stress: No Stress Concern Present   Feeling of Stress : Only a little  Social Connections: Moderately Isolated   Frequency of Communication with Friends and Family: Three times a week   Frequency of Social Gatherings with Friends and Family: Once a week   Attends Religious Services: Never   Marine scientist or Organizations: No   Attends Music therapist: Never   Marital Status: Married  Human resources officer Violence: Not At Risk   Fear of Current or Ex-Partner: No   Emotionally Abused: No   Physically Abused: No   Sexually Abused: No     Constitutional: Denies fever, malaise, fatigue, headache or abrupt weight changes.  Respiratory: Denies difficulty breathing, shortness of breath, cough or sputum production.   Cardiovascular: Denies chest pain, chest tightness, palpitations or swelling in the hands or feet.  Gastrointestinal: Patient reports dysphagia, nausea and reflux.  Denies abdominal pain, bloating, constipation, diarrhea or blood in the stool.   No other specific complaints in a complete review of systems (except as listed in HPI above).  Objective:   Physical Exam  BP 124/72 (BP Location: Left Arm, Patient Position: Sitting, Cuff Size: Large)   Pulse 75   Temp  (!) 97.1 F (36.2 C) (Temporal)   Wt 206 lb (93.4 kg)   SpO2 99%   BMI 33.25 kg/m   Wt Readings from Last 3 Encounters:  09/14/21 200 lb (90.7 kg)  05/18/21 200 lb (90.7 kg)  04/23/21 200 lb 3.2 oz (90.8 kg)    General: Appears her stated age, obese, in NAD. Cardiovascular: Normal rate and rhythm. S1,S2 noted.  No murmur, rubs or gallops noted.  Pulmonary/Chest: Normal effort and positive vesicular breath sounds. No respiratory distress. No wheezes, rales  or ronchi noted.  Abdomen: Soft and nontender. Normal bowel sounds Neurological: Alert and oriented.  BMET    Component Value Date/Time   NA 137 07/28/2020 0527   K 4.1 07/28/2020 0527   CL 106 07/28/2020 0527   CO2 25 07/28/2020 0527   GLUCOSE 122 (H) 07/28/2020 0527   BUN 13 07/28/2020 0527   CREATININE 0.89 07/28/2020 0527   CREATININE 0.90 10/09/2017 1024   CALCIUM 8.6 (L) 07/28/2020 0527   GFRNONAA >60 07/28/2020 0527   GFRNONAA 69 10/09/2017 1024   GFRAA >60 11/20/2018 0408   GFRAA 79 10/09/2017 1024    Lipid Panel     Component Value Date/Time   CHOL 289 (H) 02/20/2017 0841   TRIG 165.0 (H) 02/20/2017 0841   HDL 38.40 (L) 02/20/2017 0841   CHOLHDL 8 02/20/2017 0841   VLDL 33.0 02/20/2017 0841   LDLCALC 218 (H) 02/20/2017 0841    CBC    Component Value Date/Time   WBC 10.4 07/29/2020 0430   RBC 4.02 07/29/2020 0430   HGB 11.6 (L) 07/29/2020 0430   HCT 34.9 (L) 07/29/2020 0430   PLT 302 07/29/2020 0430   MCV 86.8 07/29/2020 0430   MCH 28.9 07/29/2020 0430   MCHC 33.2 07/29/2020 0430   RDW 13.6 07/29/2020 0430   LYMPHSABS 2.2 07/18/2020 1054   MONOABS 0.8 07/18/2020 1054   EOSABS 0.1 07/18/2020 1054   BASOSABS 0.1 07/18/2020 1054    Hgb A1C Lab Results  Component Value Date   HGBA1C 5.5 08/09/2019           Assessment & Plan:     [name], NP

## 2021-09-24 NOTE — Patient Instructions (Signed)

## 2021-09-24 NOTE — Assessment & Plan Note (Signed)
Deteriorated Pantoprazole refilled today I am concerned that she may have a stricture, referral to GI for upper endoscopy for further evaluation Try to avoid foods that trigger reflux Encourage weight loss as this can produce reflux symptoms

## 2021-10-02 ENCOUNTER — Other Ambulatory Visit: Payer: Self-pay | Admitting: Internal Medicine

## 2021-10-02 DIAGNOSIS — G4709 Other insomnia: Secondary | ICD-10-CM

## 2021-10-02 DIAGNOSIS — F32A Depression, unspecified: Secondary | ICD-10-CM

## 2021-10-03 NOTE — Telephone Encounter (Signed)
Requested medication (s) are due for refill today:   Yes for all 3  Requested medication (s) are on the active medication list:   Yes for all 3  Future visit scheduled:   Yes in 3 wks   Last ordered: Paxil and verapamil 07/09/2021 #90, 0 refills;   Seroquel 04/09/2021 #90, 1 refill non delegated refill  Returned because labs due per protocol and Seroquel is non delegated   Requested Prescriptions  Pending Prescriptions Disp Refills   PARoxetine (PAXIL) 40 MG tablet [Pharmacy Med Name: PAROXETINE '40MG'$  TABLETS] 90 tablet 0    Sig: TAKE 1 TABLET(40 MG) BY MOUTH AT BEDTIME     Psychiatry:  Antidepressants - SSRI Passed - 10/02/2021  1:31 PM      Passed - Completed PHQ-2 or PHQ-9 in the last 360 days      Passed - Valid encounter within last 6 months    Recent Outpatient Visits           1 week ago Dysphagia, unspecified type   [facility] [place], [name], NP   4 months ago Acute non-recurrent frontal sinusitis   [place], DO   5 months ago Hot flashes due to menopause   [facility] [place], [name], NP   6 months ago Acute nasopharyngitis   [facility] [place], [name], NP   6 months ago Moderate persistent asthma with acute exacerbation   [facility] [place], [name], NP       Future Appointments             In 3 weeks [place], [name], NP [facility]              verapamil (CALAN-SR) 180 MG CR tablet [Pharmacy Med Name: VERAPAMIL ER '180MG'$  TABLETS] 90 tablet 0    Sig: TAKE 1 TABLET(180 MG) BY MOUTH AT BEDTIME     Cardiovascular: Calcium Channel Blockers 3 Failed - 10/02/2021  1:31 PM      Failed - ALT in normal range and within 360 days    ALT  Date Value Ref Range Status  07/18/2020 32 0 - 44 U/L Final         Failed - AST in normal range and within 360 days    AST  Date Value Ref Range Status  07/18/2020 24 15 - 41 U/L Final          Failed - Cr in normal range and within 360 days    Creat  Date Value Ref Range Status  10/09/2017 0.90 0.50 - 0.99 mg/dL Final    Comment:    For patients >64 years of age, the reference limit for Creatinine is approximately 13% higher for people identified as African-American. .    Creatinine, Ser  Date Value Ref Range Status  07/28/2020 0.89 0.44 - 1.00 mg/dL Final         Passed - Last BP in normal range    BP Readings from Last 1 Encounters:  09/24/21 124/72         Passed - Last Heart Rate in normal range    Pulse Readings from Last 1 Encounters:  09/24/21 75         Passed - Valid encounter within last 6 months    Recent Outpatient Visits           1 week ago Dysphagia, unspecified type   [place]  [facility] [place], [name], NP   4 months ago Acute non-recurrent frontal sinusitis   [place], DO   5 months ago Hot flashes due to menopause   [facility] [place], [name], NP   6 months ago Acute nasopharyngitis   [facility] [place], [place], NP   6 months ago Moderate persistent asthma with acute exacerbation   [facility] [place], [name], NP       Future Appointments             In 3 weeks [place], [name], NP [facility]              QUEtiapine (SEROQUEL) 200 MG tablet [Pharmacy Med Name: QUETIAPINE '200MG'$  TABLETS] 90 tablet 1    Sig: TAKE 1 TABLET(200 MG) BY MOUTH AT BEDTIME     Not Delegated - Psychiatry:  Antipsychotics - Second Generation (Atypical) - quetiapine Failed - 10/02/2021  1:31 PM      Failed - This refill cannot be delegated      Failed - Lipid Panel in normal range within the last 12 months    Cholesterol  Date Value Ref Range Status  02/20/2017 289 (H) 0 - 200 mg/dL Final    Comment:    ATP III Classification       Desirable:  < 200 mg/dL               Borderline High:  200 - 239 mg/dL          High:  > = 240 mg/dL    LDL Cholesterol  Date Value Ref Range Status  02/20/2017 218 (H) 0 - 99 mg/dL Final   HDL  Date Value Ref Range Status  02/20/2017 38.40 (L) >39.00 mg/dL Final   Triglycerides  Date Value Ref Range Status  02/20/2017 165.0 (H) 0.0 - 149.0 mg/dL Final    Comment:    Normal:  <150 mg/dLBorderline High:  150 - 199 mg/dL         Failed - CBC within normal limits and completed in the last 12 months    WBC  Date Value Ref Range Status  07/29/2020 10.4 4.0 - 10.5 K/uL Final   RBC  Date Value Ref Range Status  07/29/2020 4.02 3.87 - 5.11 MIL/uL Final   Hemoglobin  Date Value Ref Range Status  07/29/2020 11.6 (L) 12.0 - 15.0 g/dL Final   HCT  Date Value Ref Range Status  07/29/2020 34.9 (L) 36.0 - 46.0 % Final   MCHC  Date Value Ref Range Status  07/29/2020 33.2 30.0 - 36.0 g/dL Final   [facility]  Date Value Ref Range Status  07/29/2020 28.9 26.0 - 34.0 pg Final   MCV  Date Value Ref Range Status  07/29/2020 86.8 80.0 - 100.0 fL Final   No results found for: PLTCOUNTKUC, LABPLAT, POCPLA RDW  Date Value Ref Range Status  07/29/2020 13.6 11.5 - 15.5 % Final         Failed - CMP within normal limits and completed in the last 12 months    Albumin  Date Value Ref Range Status  07/18/2020 4.1 3.5 - 5.0 g/dL Final   Alkaline Phosphatase  Date Value Ref Range Status  07/18/2020 81 38 - 126 U/L Final   Alkaline phosphatase (APISO)  Date Value Ref Range Status  10/09/2017 95 33 - 130 U/L Final   ALT  Date Value Ref Range Status  07/18/2020 32 0 - 44 U/L Final   AST  Date Value Ref Range Status  07/18/2020 24 15 - 41 U/L Final   BUN  Date Value Ref Range Status  07/28/2020 13 8 - 23 mg/dL Final   Calcium  Date Value Ref Range Status  07/28/2020 8.6 (L) 8.9 - 10.3 mg/dL Final   CO2  Date Value Ref Range Status  07/28/2020 25 22 - 32 mmol/L Final   Creat  Date Value Ref Range Status  10/09/2017 0.90 0.50 - 0.99 mg/dL Final    Comment:    For patients  >74 years of age, the reference limit for Creatinine is approximately 13% higher for people identified as African-American. .    Creatinine, Ser  Date Value Ref Range Status  07/28/2020 0.89 0.44 - 1.00 mg/dL Final   Glucose, Bld  Date Value Ref Range Status  07/28/2020 122 (H) 70 - 99 mg/dL Final    Comment:    Glucose reference range applies only to samples taken after fasting for at least 8 hours.   Potassium  Date Value Ref Range Status  07/28/2020 4.1 3.5 - 5.1 mmol/L Final   Sodium  Date Value Ref Range Status  07/28/2020 137 135 - 145 mmol/L Final   Total Bilirubin  Date Value Ref Range Status  07/18/2020 0.6 0.3 - 1.2 mg/dL Final   Protein, ur  Date Value Ref Range Status  07/25/2020 NEGATIVE NEGATIVE mg/dL Final   Total Protein  Date Value Ref Range Status  07/18/2020 7.3 6.5 - 8.1 g/dL Final   GFR, Est African American  Date Value Ref Range Status  10/09/2017 79 > OR = 60 mL/min/1.83m Final   GFR calc Af Amer  Date Value Ref Range Status  11/20/2018 >60 >60 mL/min Final   GFR  Date Value Ref Range Status  02/20/2017 76.18 >60.00 mL/min Final   GFR, Est Non African American  Date Value Ref Range Status  10/09/2017 69 > OR = 60 mL/min/1.754mFinal   GFR, Estimated  Date Value Ref Range Status  07/28/2020 >60 >60 mL/min Final    Comment:    (NOTE) Calculated using the CKD-EPI Creatinine Equation (2021)          Passed - TSH in normal range and within 360 days    TSH  Date Value Ref Range Status  04/23/2021 1.21 0.40 - 4.50 mIU/L Final         Passed - Completed PHQ-2 or PHQ-9 in the last 360 days      Passed - Last BP in normal range    BP Readings from Last 1 Encounters:  09/24/21 124/72         Passed - Last Heart Rate in normal range    Pulse Readings from Last 1 Encounters:  09/24/21 75         Passed - Valid encounter within last 6 months    Recent Outpatient Visits           1 week ago Dysphagia, unspecified type    [facility]   4 months ago Acute non-recurrent frontal sinusitis   [place]   5 months ago Hot flashes due to menopause   [facility]   6 months ago Acute nasopharyngitis   [facility]   6 months ago Moderate  persistent asthma with acute exacerbation   [place], NP       Future Appointments             In 3 weeks Garnette Gun[place], [name], NP [facility]

## 2021-10-23 ENCOUNTER — Ambulatory Visit: Payer: Self-pay

## 2021-10-23 NOTE — Telephone Encounter (Signed)
Apt scheduled for 10/25/2021 at 10am.  Pt advised.   Thanks,   -Mickel Baas

## 2021-10-23 NOTE — Telephone Encounter (Signed)
The patient has experienced sinus discomfort including congestion, drainage and coughing for roughly a week   The patient has previously taken over the counter medication with no improvement   The patient would like to speak with a member of staff further when possible     Chief Complaint: Sinus pain, congestion, cough, fever. Asking to be worked in with PCP for virtual. Declines [facility] Symptoms: Above Frequency: 1 week ago Pertinent Negatives: Patient denies  Disposition: '[]'$ ED /'[]'$ Urgent Care (no appt availability in office) / '[]'$ Appointment(In office/virtual)/ '[]'$  [facility]/ '[]'$ Home Care/ '[]'$ Refused Recommended Disposition /'[]'$ [facility]/ '[x]'$  Follow-up with PCP Additional Notes: Please advise pt. No availability until Thursday.  Answer Assessment - Initial Assessment Questions 1. LOCATION: "Where does it hurt?"      Face 2. ONSET: "When did the sinus pain start?"  (e.g., hours, days)      1 week ago 3. SEVERITY: "How bad is the pain?"   (Scale 1-10; mild, moderate or severe)   - MILD (1-3): doesn't interfere with normal activities    - MODERATE (4-7): interferes with normal activities (e.g., work or school) or awakens from sleep   - SEVERE (8-10): excruciating pain and patient unable to do any normal activities        Now - 7 4. RECURRENT SYMPTOM: "Have you ever had sinus problems before?" If Yes, ask: "When was the last time?" and "What happened that time?"      Yes 5. NASAL CONGESTION: "Is the nose blocked?" If Yes, ask: "Can you open it or must you breathe through your mouth?"     yes 6. NASAL DISCHARGE: "Do you have discharge from your nose?" If so ask, "What color?"     Clear 7. FEVER: "Do you have a fever?" If Yes, ask: "What is it, how was it measured, and when did it start?"      Yes - 100.6 and now 101.6 8. OTHER SYMPTOMS: "Do you have any other symptoms?" (e.g., sore throat, cough, earache, difficulty breathing)     Cough 9. PREGNANCY:  "Is there any chance you are pregnant?" "When was your last menstrual period?"     No  Protocols used: Sinus Pain or Congestion-A-AH

## 2021-10-25 ENCOUNTER — Ambulatory Visit: Payer: Medicare HMO | Admitting: Internal Medicine

## 2021-10-26 DIAGNOSIS — Z03818 Encounter for observation for suspected exposure to other biological agents ruled out: Secondary | ICD-10-CM | POA: Diagnosis not present

## 2021-10-26 DIAGNOSIS — J45901 Unspecified asthma with (acute) exacerbation: Secondary | ICD-10-CM | POA: Diagnosis not present

## 2021-10-26 DIAGNOSIS — R509 Fever, unspecified: Secondary | ICD-10-CM | POA: Diagnosis not present

## 2021-10-26 DIAGNOSIS — R051 Acute cough: Secondary | ICD-10-CM | POA: Diagnosis not present

## 2021-10-26 DIAGNOSIS — R059 Cough, unspecified: Secondary | ICD-10-CM | POA: Diagnosis not present

## 2021-10-26 DIAGNOSIS — J209 Acute bronchitis, unspecified: Secondary | ICD-10-CM | POA: Diagnosis not present

## 2021-10-26 DIAGNOSIS — R062 Wheezing: Secondary | ICD-10-CM | POA: Diagnosis not present

## 2021-10-27 ENCOUNTER — Other Ambulatory Visit: Payer: Self-pay | Admitting: Internal Medicine

## 2021-10-27 DIAGNOSIS — K219 Gastro-esophageal reflux disease without esophagitis: Secondary | ICD-10-CM

## 2021-10-29 ENCOUNTER — Encounter: Payer: Self-pay | Admitting: Internal Medicine

## 2021-10-29 ENCOUNTER — Ambulatory Visit (INDEPENDENT_AMBULATORY_CARE_PROVIDER_SITE_OTHER): Payer: Medicare HMO | Admitting: Internal Medicine

## 2021-10-29 VITALS — BP 136/86 | HR 83 | Temp 97.1°F | Ht 66.0 in | Wt 206.0 lb

## 2021-10-29 DIAGNOSIS — Z1159 Encounter for screening for other viral diseases: Secondary | ICD-10-CM

## 2021-10-29 DIAGNOSIS — E6609 Other obesity due to excess calories: Secondary | ICD-10-CM | POA: Diagnosis not present

## 2021-10-29 DIAGNOSIS — Z1211 Encounter for screening for malignant neoplasm of colon: Secondary | ICD-10-CM | POA: Diagnosis not present

## 2021-10-29 DIAGNOSIS — Z6833 Body mass index (BMI) 33.0-33.9, adult: Secondary | ICD-10-CM | POA: Diagnosis not present

## 2021-10-29 DIAGNOSIS — R7309 Other abnormal glucose: Secondary | ICD-10-CM | POA: Diagnosis not present

## 2021-10-29 DIAGNOSIS — J4520 Mild intermittent asthma, uncomplicated: Secondary | ICD-10-CM | POA: Diagnosis not present

## 2021-10-29 DIAGNOSIS — Z0001 Encounter for general adult medical examination with abnormal findings: Secondary | ICD-10-CM | POA: Diagnosis not present

## 2021-10-29 DIAGNOSIS — E785 Hyperlipidemia, unspecified: Secondary | ICD-10-CM | POA: Diagnosis not present

## 2021-10-29 MED ORDER — BUDESONIDE-FORMOTEROL FUMARATE 160-4.5 MCG/ACT IN AERO: 2.0000 | INHALATION_SPRAY | Freq: Two times a day (BID) | RESPIRATORY_TRACT | 3 refills | Status: DC

## 2021-10-29 MED ORDER — ALBUTEROL SULFATE HFA 108 (90 BASE) MCG/ACT IN AERS: 1.0000 | INHALATION_SPRAY | Freq: Four times a day (QID) | RESPIRATORY_TRACT | 1 refills | Status: DC | PRN

## 2021-10-29 NOTE — Assessment & Plan Note (Signed)
She is interested in weight loss medication Will review labs and discuss further with patient

## 2021-10-30 ENCOUNTER — Other Ambulatory Visit: Payer: Self-pay | Admitting: Family Medicine

## 2021-10-30 DIAGNOSIS — R918 Other nonspecific abnormal finding of lung field: Secondary | ICD-10-CM

## 2021-10-31 LAB — CBC
HCT: 43.8 % (ref 35.0–45.0)
Hemoglobin: 14.4 g/dL (ref 11.7–15.5)
MCH: 28.1 pg (ref 27.0–33.0)
MCHC: 32.9 g/dL (ref 32.0–36.0)
MCV: 85.5 fL (ref 80.0–100.0)
MPV: 9.3 fL (ref 7.5–12.5)
Platelets: 444 10*3/uL — ABNORMAL HIGH (ref 140–400)
RBC: 5.12 10*6/uL — ABNORMAL HIGH (ref 3.80–5.10)
RDW: 13.2 % (ref 11.0–15.0)
WBC: 11 10*3/uL — ABNORMAL HIGH (ref 3.8–10.8)

## 2021-10-31 LAB — COMPLETE METABOLIC PANEL WITH GFR
AG Ratio: 2 (calc) (ref 1.0–2.5)
ALT: 23 U/L (ref 6–29)
AST: 14 U/L (ref 10–35)
Albumin: 4.4 g/dL (ref 3.6–5.1)
Alkaline phosphatase (APISO): 85 U/L (ref 37–153)
BUN: 18 mg/dL (ref 7–25)
CO2: 25 mmol/L (ref 20–32)
Calcium: 9.5 mg/dL (ref 8.6–10.4)
Chloride: 106 mmol/L (ref 98–110)
Creat: 0.86 mg/dL (ref 0.50–1.05)
Globulin: 2.2 g/dL (calc) (ref 1.9–3.7)
Glucose, Bld: 123 mg/dL (ref 65–139)
Potassium: 3.8 mmol/L (ref 3.5–5.3)
Sodium: 140 mmol/L (ref 135–146)
Total Bilirubin: 0.3 mg/dL (ref 0.2–1.2)
Total Protein: 6.6 g/dL (ref 6.1–8.1)
eGFR: 74 mL/min/{1.73_m2} (ref >=60)

## 2021-10-31 LAB — LIPID PANEL
Cholesterol: 268 mg/dL — ABNORMAL HIGH (ref <200)
HDL: 47 mg/dL — ABNORMAL LOW (ref >=50)
LDL Cholesterol (Calc): 181 mg/dL (calc) — ABNORMAL HIGH
Non-HDL Cholesterol (Calc): 221 mg/dL (calc) — ABNORMAL HIGH (ref <130)
Total CHOL/HDL Ratio: 5.7 (calc) — ABNORMAL HIGH (ref <5.0)
Triglycerides: 212 mg/dL — ABNORMAL HIGH (ref <150)

## 2021-10-31 LAB — HEPATITIS C ANTIBODY: Hepatitis C Ab: NONREACTIVE

## 2021-10-31 LAB — HEMOGLOBIN A1C
Hgb A1c MFr Bld: 5.5 % of total Hgb (ref <5.7)
Mean Plasma Glucose: 111 mg/dL
eAG (mmol/L): 6.2 mmol/L

## 2021-11-01 MED ORDER — INSULIN PEN NEEDLE 31G X 5 MM MISC: 0 refills | Status: DC

## 2021-11-01 MED ORDER — SAXENDA 18 MG/3ML ~~LOC~~ SOPN: PEN_INJECTOR | SUBCUTANEOUS | 0 refills | Status: DC

## 2021-11-01 NOTE — Addendum Note (Signed)
Addended by: Jearld Fenton on: 11/01/2021 07:52 AM   Modules accepted: Orders

## 2021-11-05 ENCOUNTER — Telehealth: Payer: Self-pay

## 2021-11-05 DIAGNOSIS — E782 Mixed hyperlipidemia: Secondary | ICD-10-CM | POA: Insufficient documentation

## 2021-11-05 MED ORDER — ATORVASTATIN CALCIUM 10 MG PO TABS: 10.0000 mg | ORAL_TABLET | Freq: Every day | ORAL | 0 refills | Status: DC

## 2021-11-05 NOTE — Telephone Encounter (Signed)
Pt advised.  Apt for 02/07/2022.   Thanks,   -Mickel Baas

## 2021-11-05 NOTE — Addendum Note (Signed)
Addended by: Jearld Fenton on: 11/05/2021 09:56 AM   Modules accepted: Orders

## 2021-11-05 NOTE — Telephone Encounter (Signed)
Copied from Michele Newton [id number]. Topic: General - Other >> Nov 02, 2021  4:45 PM Everette C wrote: Reason for CRM: The patient has called to share that they have had second thoughts on their previous discussion about being prescribed cholesterol medication   The patient initially declined to be prescribed a new medication but has changed their mind and would like to be prescribed the cholesterol medication they had previously discussed with Prov. Baity   Please contact the patient further when possible

## 2021-11-05 NOTE — Telephone Encounter (Signed)
I have sent this in for her.  She needs to schedule a lab only appointment in 3 months to repeat lipids.

## 2021-11-07 DIAGNOSIS — R918 Other nonspecific abnormal finding of lung field: Secondary | ICD-10-CM | POA: Diagnosis not present

## 2021-11-08 DIAGNOSIS — Z1211 Encounter for screening for malignant neoplasm of colon: Secondary | ICD-10-CM | POA: Diagnosis not present

## 2021-11-15 LAB — COLOGUARD: COLOGUARD: NEGATIVE

## 2021-11-30 DIAGNOSIS — G8929 Other chronic pain: Secondary | ICD-10-CM | POA: Diagnosis not present

## 2021-11-30 DIAGNOSIS — M25551 Pain in right hip: Secondary | ICD-10-CM | POA: Diagnosis not present

## 2021-11-30 DIAGNOSIS — Z96641 Presence of right artificial hip joint: Secondary | ICD-10-CM | POA: Diagnosis not present

## 2021-12-03 ENCOUNTER — Other Ambulatory Visit: Payer: Self-pay | Admitting: Orthopedic Surgery

## 2021-12-14 ENCOUNTER — Other Ambulatory Visit: Payer: Self-pay

## 2021-12-14 ENCOUNTER — Encounter
Admission: RE | Admit: 2021-12-14 | Discharge: 2021-12-14 | Disposition: A | Discharge status: Home / Self Care | Payer: Medicare HMO | Source: Ambulatory Visit | Attending: Orthopedic Surgery | Admitting: Orthopedic Surgery

## 2021-12-14 VITALS — BP 147/77 | HR 79 | Resp 16 | Ht 66.0 in | Wt 208.6 lb

## 2021-12-14 DIAGNOSIS — Z01818 Encounter for other preprocedural examination: Secondary | ICD-10-CM | POA: Diagnosis not present

## 2021-12-14 DIAGNOSIS — Z01812 Encounter for preprocedural laboratory examination: Secondary | ICD-10-CM

## 2021-12-14 HISTORY — DX: Nausea with vomiting, unspecified: Z98.890

## 2021-12-14 HISTORY — DX: Other specified postprocedural states: Z98.890

## 2021-12-14 HISTORY — DX: Essential (primary) hypertension: I10

## 2021-12-14 HISTORY — DX: Nausea with vomiting, unspecified: R11.2

## 2021-12-14 LAB — URINALYSIS, ROUTINE W REFLEX MICROSCOPIC
Bilirubin Urine: NEGATIVE
Glucose, UA: NEGATIVE mg/dL
Hgb urine dipstick: NEGATIVE
Ketones, ur: NEGATIVE mg/dL
Nitrite: NEGATIVE
Protein, ur: NEGATIVE mg/dL
Specific Gravity, Urine: 1.025 (ref 1.005–1.030)
Squamous Epithelial / HPF: >50 — ABNORMAL HIGH (ref 0–5)
pH: 5 (ref 5.0–8.0)

## 2021-12-14 LAB — TYPE AND SCREEN
ABO/RH(D): O POS
Antibody Screen: NEGATIVE

## 2021-12-14 LAB — SURGICAL PCR SCREEN
MRSA, PCR: NEGATIVE
Staphylococcus aureus: NEGATIVE

## 2021-12-14 NOTE — Patient Instructions (Addendum)
Your procedure is scheduled on: 12/25/21 - Tuesday Report to the Registration Desk on the 1st floor of the [facility]. To find out your arrival time, please call [phone] between 1PM - 3PM on: 12/24/21 - Monday If your arrival time is 6:00 am, do not arrive prior to that time as the [place] entrance doors do not open until 6:00 am.  REMEMBER: Instructions that are not followed completely may result in serious medical risk, up to and including death; or upon the discretion of your surgeon and anesthesiologist your surgery may need to be rescheduled.  Do not eat food after midnight the night before surgery.  No gum chewing, lozengers or hard candies.  You may however, drink CLEAR liquids up to 2 hours before you are scheduled to arrive for your surgery. Do not drink anything within 2 hours of your scheduled arrival time.  Clear liquids include: - water  - apple juice without pulp - gatorade (not RED colors) - black coffee or tea (Do NOT add milk or creamers to the coffee or tea) Do NOT drink anything that is not on this list.  In addition, your doctor has ordered for you to drink the provided  Ensure Pre-Surgery Clear Carbohydrate Drink  Drinking this carbohydrate drink up to two hours before surgery helps to reduce insulin resistance and improve patient outcomes. Please complete drinking 2 hours prior to scheduled arrival time.  TAKE THESE MEDICATIONS THE MORNING OF SURGERY WITH A SIP OF WATER:  - budesonide-formoterol (SYMBICORT)   - pantoprazole (PROTONIX)   Use albuterol (VENTOLIN HFA) 108 (90 Base) MCG/ACT inhale on the day of surgery and bring to the hospital.  One week prior to surgery: 12/18/21 :  Stop Anti-inflammatories (NSAIDS) such as Advil, Aleve, Ibuprofen, Motrin, Naproxen, Naprosyn and Aspirin based products such as Excedrin, Goodys Powder, BC Powder.  Stop ANY OVER THE COUNTER supplements until after surgery.  You may however, continue to take Tylenol  if needed for pain up until the day of surgery.  No Alcohol for 24 hours before or after surgery.  No Smoking including e-cigarettes for 24 hours prior to surgery.  No chewable tobacco products for at least 6 hours prior to surgery.  No nicotine patches on the day of surgery.  Do not use any "recreational" drugs for at least a week prior to your surgery.  Please be advised that the combination of cocaine and anesthesia may have negative outcomes, up to and including death. If you test positive for cocaine, your surgery will be cancelled.  On the morning of surgery brush your teeth with toothpaste and water, you may rinse your mouth with mouthwash if you wish. Do not swallow any toothpaste or mouthwash.  Use CHG Soap or wipes as directed on instruction sheet.  Do not wear jewelry, make-up, hairpins, clips or nail polish.  Do not wear lotions, powders, or perfumes.   Do not shave body from the neck down 48 hours prior to surgery just in case you cut yourself which could leave a site for infection.  Also, freshly shaved skin may become irritated if using the CHG soap.  Contact lenses, hearing aids and dentures may not be worn into surgery.  Do not bring valuables to the hospital. [facility] is not responsible for any missing/lost belongings or valuables.   Notify your doctor if there is any change in your medical condition (cold, fever, infection).  Wear comfortable clothing (specific to your surgery type) to the hospital.  After  surgery, you can help prevent lung complications by doing breathing exercises.  Take deep breaths and cough every 1-2 hours. Your doctor may order a device called an Incentive Spirometer to help you take deep breaths. When coughing or sneezing, hold a pillow firmly against your incision with both hands. This is called "splinting." Doing this helps protect your incision. It also decreases belly discomfort.  If you are being admitted to the hospital  overnight, leave your suitcase in the car. After surgery it may be brought to your room.  If you are being discharged the day of surgery, you will not be allowed to drive home. You will need a responsible adult (18 years or older) to drive you home and stay with you that night.   If you are taking public transportation, you will need to have a responsible adult (18 years or older) with you. Please confirm with your physician that it is acceptable to use public transportation.   Please call the [facility] Dept. at [phone] if you have any questions about these instructions.  Surgery Visitation Policy:  Patients undergoing a surgery or procedure may have two family members or support persons with them as long as the person is not COVID-19 positive or experiencing its symptoms.   Inpatient Visitation:    Visiting hours are 7 a.m. to 8 p.m. Up to four visitors are allowed at one time in a patient room, including children. The visitors may rotate out with other people during the day. One designated support person (adult) may remain overnight.

## 2021-12-19 ENCOUNTER — Other Ambulatory Visit: Payer: Self-pay | Admitting: Internal Medicine

## 2021-12-19 DIAGNOSIS — G4709 Other insomnia: Secondary | ICD-10-CM

## 2021-12-19 DIAGNOSIS — F419 Anxiety disorder, unspecified: Secondary | ICD-10-CM

## 2021-12-19 NOTE — Telephone Encounter (Signed)
Requested Prescriptions  Pending Prescriptions Disp Refills  . PARoxetine (PAXIL) 40 MG tablet [Pharmacy Med Name: PAROXETINE 40MG TABLETS] 90 tablet 0    Sig: TAKE 1 TABLET(40 MG) BY MOUTH AT BEDTIME     Psychiatry:  Antidepressants - SSRI Passed - 12/19/2021  7:09 AM      Passed - Completed PHQ-2 or PHQ-9 in the last 360 days      Passed - Valid encounter within last 6 months    Recent Outpatient Visits          1 month ago Screening for colon cancer   [facility] [place], [name], NP   2 months ago Dysphagia, unspecified type   [facility] [place], [name], NP   7 months ago Acute non-recurrent frontal sinusitis   [facility] [place], Devonne Doughty, DO   8 months ago Hot flashes due to menopause   [facility] [place], [name], NP   9 months ago Acute nasopharyngitis   [facility] [place], [place] W, NP             . verapamil (CALAN-SR) 180 MG CR tablet [Pharmacy Med Name: VERAPAMIL ER 180MG TABLETS] 90 tablet 0    Sig: TAKE 1 TABLET(180 MG) BY MOUTH AT BEDTIME     Cardiovascular: Calcium Channel Blockers 3 Failed - 12/19/2021  7:09 AM      Failed - Last BP in normal range    BP Readings from Last 1 Encounters:  12/14/21 (!) 147/77         Passed - ALT in normal range and within 360 days    ALT  Date Value Ref Range Status  10/29/2021 23 6 - 29 U/L Final         Passed - AST in normal range and within 360 days    AST  Date Value Ref Range Status  10/29/2021 14 10 - 35 U/L Final         Passed - Cr in normal range and within 360 days    Creat  Date Value Ref Range Status  10/29/2021 0.86 0.50 - 1.05 mg/dL Final         Passed - Last Heart Rate in normal range    Pulse Readings from Last 1 Encounters:  12/14/21 79         Passed - Valid encounter within last 6 months    Recent Outpatient Visits          1 month ago Screening for colon cancer   [facility] [place],  [name], NP   2 months ago Dysphagia, unspecified type   [facility] [place], [place], [place]   7 months ago Acute non-recurrent frontal sinusitis   [place], DO   8 months ago Hot flashes due to menopause   [facility] [place], [name], NP   9 months ago Acute nasopharyngitis   [facility] [place], [place] W, NP             . QUEtiapine (SEROQUEL) 200 MG tablet [Pharmacy Med Name: QUETIAPINE 200MG TABLETS] 90 tablet     Sig: TAKE 1 TABLET(200 MG) BY MOUTH AT BEDTIME     Not Delegated - Psychiatry:  Antipsychotics - Second Generation (Atypical) - quetiapine Failed - 12/19/2021  7:09 AM      Failed - This refill cannot be delegated  Failed - Last BP in normal range    BP Readings from Last 1 Encounters:  12/14/21 (!) 147/77         Failed - Lipid Panel in normal range within the last 12 months    Cholesterol  Date Value Ref Range Status  10/29/2021 268 (H) <200 mg/dL Final   LDL Cholesterol (Calc)  Date Value Ref Range Status  10/29/2021 181 (H) mg/dL (calc) Final    Comment:    Reference range: <100 . Desirable range <100 mg/dL for primary prevention;   <70 mg/dL for patients with CHD or diabetic patients  with > or = 2 CHD risk factors. Marland Kitchen LDL-C is now calculated using the Martin-Hopkins  calculation, which is a validated novel method providing  better accuracy than the Friedewald equation in the  estimation of LDL-C.  [name] et al. Annamaria Helling. 0086;761(95): 2061-2068  (http://education.Quest[facility].com/faq/FAQ164)    HDL  Date Value Ref Range Status  10/29/2021 47 (L) > OR = 50 mg/dL Final   Triglycerides  Date Value Ref Range Status  10/29/2021 212 (H) <150 mg/dL Final    Comment:    . If a non-fasting specimen was collected, consider repeat triglyceride testing on a fasting specimen if clinically indicated.  [name] et al. J. of Clin. Lipidol. 0932;6:712-458. .           Failed - CBC within normal limits and completed in the last 12 months    WBC  Date Value Ref Range Status  10/29/2021 11.0 (H) 3.8 - 10.8 Thousand/uL Final   RBC  Date Value Ref Range Status  10/29/2021 5.12 (H) 3.80 - 5.10 Million/uL Final   Hemoglobin  Date Value Ref Range Status  10/29/2021 14.4 11.7 - 15.5 g/dL Final   HCT  Date Value Ref Range Status  10/29/2021 43.8 35.0 - 45.0 % Final   MCHC  Date Value Ref Range Status  10/29/2021 32.9 32.0 - 36.0 g/dL Final   [facility]  Date Value Ref Range Status  10/29/2021 28.1 27.0 - 33.0 pg Final   MCV  Date Value Ref Range Status  10/29/2021 85.5 80.0 - 100.0 fL Final   No results found for: "PLTCOUNTKUC", "LABPLAT", "POCPLA" RDW  Date Value Ref Range Status  10/29/2021 13.2 11.0 - 15.0 % Final         Failed - CMP within normal limits and completed in the last 12 months    Albumin  Date Value Ref Range Status  07/18/2020 4.1 3.5 - 5.0 g/dL Final   Alkaline Phosphatase  Date Value Ref Range Status  07/18/2020 81 38 - 126 U/L Final   Alkaline phosphatase (APISO)  Date Value Ref Range Status  10/29/2021 85 37 - 153 U/L Final   ALT  Date Value Ref Range Status  10/29/2021 23 6 - 29 U/L Final   AST  Date Value Ref Range Status  10/29/2021 14 10 - 35 U/L Final   BUN  Date Value Ref Range Status  10/29/2021 18 7 - 25 mg/dL Final   Calcium  Date Value Ref Range Status  10/29/2021 9.5 8.6 - 10.4 mg/dL Final   CO2  Date Value Ref Range Status  10/29/2021 25 20 - 32 mmol/L Final   Creat  Date Value Ref Range Status  10/29/2021 0.86 0.50 - 1.05 mg/dL Final   Glucose, Bld  Date Value Ref Range Status  10/29/2021 123 65 - 139 mg/dL Final    Comment:    .  Non-fasting reference interval .    Potassium  Date Value Ref Range Status  10/29/2021 3.8 3.5 - 5.3 mmol/L Final   Sodium  Date Value Ref Range Status  10/29/2021 140 135 - 146 mmol/L Final   Total Bilirubin  Date Value Ref  Range Status  10/29/2021 0.3 0.2 - 1.2 mg/dL Final   Protein, ur  Date Value Ref Range Status  12/14/2021 NEGATIVE NEGATIVE mg/dL Final   Total Protein  Date Value Ref Range Status  10/29/2021 6.6 6.1 - 8.1 g/dL Final   GFR, Est African American  Date Value Ref Range Status  10/09/2017 79 > OR = 60 mL/min/1.77m Final   GFR calc Af Amer  Date Value Ref Range Status  11/20/2018 >60 >60 mL/min Final   GFR  Date Value Ref Range Status  02/20/2017 76.18 >60.00 mL/min Final   eGFR  Date Value Ref Range Status  10/29/2021 74 > OR = 60 mL/min/1.73mFinal    Comment:    The eGFR is based on the CKD-EPI 2021 equation. To calculate  the new eGFR from a previous Creatinine or Cystatin C result, go to https://www.kidney.org/professionals/ kdoqi/gfr%5Fcalculator    GFR, Est Non African American  Date Value Ref Range Status  10/09/2017 69 > OR = 60 mL/min/1.7357minal   GFR, Estimated  Date Value Ref Range Status  07/28/2020 >60 >60 mL/min Final    Comment:    (NOTE) Calculated using the CKD-EPI Creatinine Equation (2021)          Passed - TSH in normal range and within 360 days    TSH  Date Value Ref Range Status  04/23/2021 1.21 0.40 - 4.50 mIU/L Final         Passed - Completed PHQ-2 or PHQ-9 in the last 360 days      Passed - Last Heart Rate in normal range    Pulse Readings from Last 1 Encounters:  12/14/21 79         Passed - Valid encounter within last 6 months    Recent Outpatient Visits          1 month ago Screening for colon cancer   [facility]   2 months ago Dysphagia, unspecified type   [facility]   7 months ago Acute non-recurrent frontal sinusitis   [place]   8 months ago Hot flashes due to menopause   [facility]   9 months ago Acute nasopharyngitis   [facility] [place]

## 2021-12-19 NOTE — Telephone Encounter (Signed)
Requested medication (s) are due for refill today: yes  Requested medication (s) are on the active medication list: yes  Last refill:  10/04/21 #90  Future visit scheduled: no  Notes to clinic:  med not delegated to NT to RF   Requested Prescriptions  Pending Prescriptions Disp Refills   QUEtiapine (SEROQUEL) 200 MG tablet [Pharmacy Med Name: QUETIAPINE 200MG TABLETS] 90 tablet     Sig: TAKE 1 TABLET(200 MG) BY MOUTH AT BEDTIME     Not Delegated - Psychiatry:  Antipsychotics - Second Generation (Atypical) - quetiapine Failed - 12/19/2021  7:09 AM      Failed - This refill cannot be delegated      Failed - Last BP in normal range    BP Readings from Last 1 Encounters:  12/14/21 (!) 147/77         Failed - Lipid Panel in normal range within the last 12 months    Cholesterol  Date Value Ref Range Status  10/29/2021 268 (H) <200 mg/dL Final   LDL Cholesterol (Calc)  Date Value Ref Range Status  10/29/2021 181 (H) mg/dL (calc) Final    Comment:    Reference range: <100 . Desirable range <100 mg/dL for primary prevention;   <70 mg/dL for patients with CHD or diabetic patients  with > or = 2 CHD risk factors. Marland Kitchen LDL-C is now calculated using the Martin-Hopkins  calculation, which is a validated novel method providing  better accuracy than the Friedewald equation in the  estimation of LDL-C.  [name] et al. Annamaria Helling. 0488;891(69): 2061-2068  (http://education.[facility].com/faq/FAQ164)    HDL  Date Value Ref Range Status  10/29/2021 47 (L) > OR = 50 mg/dL Final   Triglycerides  Date Value Ref Range Status  10/29/2021 212 (H) <150 mg/dL Final    Comment:    . If a non-fasting specimen was collected, consider repeat triglyceride testing on a fasting specimen if clinically indicated.  [name] et al. J. of Clin. Lipidol. 4503;8:882-800. .          Failed - CBC within normal limits and completed in the last 12 months    WBC  Date Value Ref Range Status   10/29/2021 11.0 (H) 3.8 - 10.8 Thousand/uL Final   RBC  Date Value Ref Range Status  10/29/2021 5.12 (H) 3.80 - 5.10 Million/uL Final   Hemoglobin  Date Value Ref Range Status  10/29/2021 14.4 11.7 - 15.5 g/dL Final   HCT  Date Value Ref Range Status  10/29/2021 43.8 35.0 - 45.0 % Final   MCHC  Date Value Ref Range Status  10/29/2021 32.9 32.0 - 36.0 g/dL Final   [facility]  Date Value Ref Range Status  10/29/2021 28.1 27.0 - 33.0 pg Final   MCV  Date Value Ref Range Status  10/29/2021 85.5 80.0 - 100.0 fL Final   No results found for: "PLTCOUNTKUC", "LABPLAT", "POCPLA" RDW  Date Value Ref Range Status  10/29/2021 13.2 11.0 - 15.0 % Final         Failed - CMP within normal limits and completed in the last 12 months    Albumin  Date Value Ref Range Status  07/18/2020 4.1 3.5 - 5.0 g/dL Final   Alkaline Phosphatase  Date Value Ref Range Status  07/18/2020 81 38 - 126 U/L Final   Alkaline phosphatase (APISO)  Date Value Ref Range Status  10/29/2021 85 37 - 153 U/L Final   ALT  Date Value Ref Range Status  10/29/2021 23  6 - 29 U/L Final   AST  Date Value Ref Range Status  10/29/2021 14 10 - 35 U/L Final   BUN  Date Value Ref Range Status  10/29/2021 18 7 - 25 mg/dL Final   Calcium  Date Value Ref Range Status  10/29/2021 9.5 8.6 - 10.4 mg/dL Final   CO2  Date Value Ref Range Status  10/29/2021 25 20 - 32 mmol/L Final   Creat  Date Value Ref Range Status  10/29/2021 0.86 0.50 - 1.05 mg/dL Final   Glucose, Bld  Date Value Ref Range Status  10/29/2021 123 65 - 139 mg/dL Final    Comment:    .        Non-fasting reference interval .    Potassium  Date Value Ref Range Status  10/29/2021 3.8 3.5 - 5.3 mmol/L Final   Sodium  Date Value Ref Range Status  10/29/2021 140 135 - 146 mmol/L Final   Total Bilirubin  Date Value Ref Range Status  10/29/2021 0.3 0.2 - 1.2 mg/dL Final   Protein, ur  Date Value Ref Range Status  12/14/2021 NEGATIVE  NEGATIVE mg/dL Final   Total Protein  Date Value Ref Range Status  10/29/2021 6.6 6.1 - 8.1 g/dL Final   GFR, Est African American  Date Value Ref Range Status  10/09/2017 79 > OR = 60 mL/min/1.29m Final   GFR calc Af Amer  Date Value Ref Range Status  11/20/2018 >60 >60 mL/min Final   GFR  Date Value Ref Range Status  02/20/2017 76.18 >60.00 mL/min Final   eGFR  Date Value Ref Range Status  10/29/2021 74 > OR = 60 mL/min/1.745mFinal    Comment:    The eGFR is based on the CKD-EPI 2021 equation. To calculate  the new eGFR from a previous Creatinine or Cystatin C result, go to https://www.kidney.org/professionals/ kdoqi/gfr%5Fcalculator    GFR, Est Non African American  Date Value Ref Range Status  10/09/2017 69 > OR = 60 mL/min/1.7353minal   GFR, Estimated  Date Value Ref Range Status  07/28/2020 >60 >60 mL/min Final    Comment:    (NOTE) Calculated using the CKD-EPI Creatinine Equation (2021)          Passed - TSH in normal range and within 360 days    TSH  Date Value Ref Range Status  04/23/2021 1.21 0.40 - 4.50 mIU/L Final         Passed - Completed PHQ-2 or PHQ-9 in the last 360 days      Passed - Last Heart Rate in normal range    Pulse Readings from Last 1 Encounters:  12/14/21 79         Passed - Valid encounter within last 6 months    Recent Outpatient Visits           1 month ago [facility]ening for colon cancer   [facility]   2 months ago Dysphagia, unspecified type   [facility]   7 months ago Acute non-recurrent frontal sinusitis   [facility]   8 months ago Hot flashes due to menopause   [facility]   9 months ago Acute nasopharyngitis   [facility]              Signed Prescriptions Disp Refills   PARoxetine (PAXIL)  40 MG tablet 90 tablet 0     Sig: TAKE 1 TABLET(40 MG) BY MOUTH AT BEDTIME     Psychiatry:  Antidepressants - SSRI Passed - 12/19/2021  7:09 AM      Passed - Completed PHQ-2 or PHQ-9 in the last 360 days      Passed - Valid encounter within last 6 months    Recent Outpatient Visits           1 month ago Screening for colon cancer   [facility] [place], [name], NP   2 months ago Dysphagia, unspecified type   [facility] [place], [name], NP   7 months ago Acute non-recurrent frontal sinusitis   [place], DO   8 months ago Hot flashes due to menopause   [facility] [place], [name], NP   9 months ago Acute nasopharyngitis   [facility] [place], [place] W, NP               verapamil (CALAN-SR) 180 MG CR tablet 90 tablet 0    Sig: TAKE 1 TABLET(180 MG) BY MOUTH AT BEDTIME     Cardiovascular: Calcium Channel Blockers 3 Failed - 12/19/2021  7:09 AM      Failed - Last BP in normal range    BP Readings from Last 1 Encounters:  12/14/21 (!) 147/77         Passed - ALT in normal range and within 360 days    ALT  Date Value Ref Range Status  10/29/2021 23 6 - 29 U/L Final         Passed - AST in normal range and within 360 days    AST  Date Value Ref Range Status  10/29/2021 14 10 - 35 U/L Final         Passed - Cr in normal range and within 360 days    Creat  Date Value Ref Range Status  10/29/2021 0.86 0.50 - 1.05 mg/dL Final         Passed - Last Heart Rate in normal range    Pulse Readings from Last 1 Encounters:  12/14/21 79         Passed - Valid encounter within last 6 months    Recent Outpatient Visits           1 month ago Screening for colon cancer   [place], [name], NP   2 months ago Dysphagia, unspecified type   [facility] [place], [name], NP   7 months ago Acute non-recurrent frontal sinusitis   [place], DO   8 months ago Hot flashes due to menopause   [place], NP   9 months ago Acute nasopharyngitis   [facility] [place], [name], [place]

## 2021-12-25 ENCOUNTER — Inpatient Hospital Stay: Payer: Medicare HMO

## 2021-12-25 ENCOUNTER — Inpatient Hospital Stay: Payer: Medicare HMO | Admitting: Urgent Care

## 2021-12-25 ENCOUNTER — Encounter
Admission: RE | Disposition: A | Discharge status: Home Health | Payer: Self-pay | Source: Home / Self Care | Attending: Orthopedic Surgery

## 2021-12-25 ENCOUNTER — Inpatient Hospital Stay
Admission: RE | Admit: 2021-12-25 | Discharge: 2021-12-26 | DRG: 468 | Disposition: A | Discharge status: Home Health | Payer: Medicare HMO | Attending: Orthopedic Surgery | Admitting: Orthopedic Surgery

## 2021-12-25 ENCOUNTER — Encounter: Payer: Self-pay | Admitting: Orthopedic Surgery

## 2021-12-25 ENCOUNTER — Other Ambulatory Visit: Payer: Self-pay

## 2021-12-25 DIAGNOSIS — G8929 Other chronic pain: Secondary | ICD-10-CM | POA: Diagnosis not present

## 2021-12-25 DIAGNOSIS — J45909 Unspecified asthma, uncomplicated: Secondary | ICD-10-CM | POA: Diagnosis present

## 2021-12-25 DIAGNOSIS — Z8261 Family history of arthritis: Secondary | ICD-10-CM

## 2021-12-25 DIAGNOSIS — Z471 Aftercare following joint replacement surgery: Secondary | ICD-10-CM | POA: Diagnosis not present

## 2021-12-25 DIAGNOSIS — T8484XA Pain due to internal orthopedic prosthetic devices, implants and grafts, initial encounter: Secondary | ICD-10-CM | POA: Diagnosis not present

## 2021-12-25 DIAGNOSIS — Z888 Allergy status to other drugs, medicaments and biological substances status: Secondary | ICD-10-CM

## 2021-12-25 DIAGNOSIS — Z8249 Family history of ischemic heart disease and other diseases of the circulatory system: Secondary | ICD-10-CM | POA: Diagnosis not present

## 2021-12-25 DIAGNOSIS — Z79899 Other long term (current) drug therapy: Secondary | ICD-10-CM

## 2021-12-25 DIAGNOSIS — I10 Essential (primary) hypertension: Secondary | ICD-10-CM | POA: Diagnosis not present

## 2021-12-25 DIAGNOSIS — Z96649 Presence of unspecified artificial hip joint: Secondary | ICD-10-CM

## 2021-12-25 DIAGNOSIS — M25551 Pain in right hip: Secondary | ICD-10-CM | POA: Diagnosis not present

## 2021-12-25 DIAGNOSIS — Z96641 Presence of right artificial hip joint: Secondary | ICD-10-CM | POA: Diagnosis not present

## 2021-12-25 DIAGNOSIS — F32A Depression, unspecified: Secondary | ICD-10-CM | POA: Diagnosis present

## 2021-12-25 DIAGNOSIS — K219 Gastro-esophageal reflux disease without esophagitis: Secondary | ICD-10-CM | POA: Diagnosis not present

## 2021-12-25 DIAGNOSIS — Y831 Surgical operation with implant of artificial internal device as the cause of abnormal reaction of the patient, or of later complication, without mention of misadventure at the time of the procedure: Secondary | ICD-10-CM | POA: Diagnosis present

## 2021-12-25 DIAGNOSIS — L4050 Arthropathic psoriasis, unspecified: Secondary | ICD-10-CM | POA: Diagnosis present

## 2021-12-25 DIAGNOSIS — E782 Mixed hyperlipidemia: Secondary | ICD-10-CM | POA: Diagnosis not present

## 2021-12-25 DIAGNOSIS — T84090A Other mechanical complication of internal right hip prosthesis, initial encounter: Secondary | ICD-10-CM | POA: Diagnosis not present

## 2021-12-25 DIAGNOSIS — Z7951 Long term (current) use of inhaled steroids: Secondary | ICD-10-CM

## 2021-12-25 HISTORY — PX: TOTAL HIP REVISION: SHX763

## 2021-12-25 LAB — CBC
HCT: 40.3 % (ref 36.0–46.0)
Hemoglobin: 12.9 g/dL (ref 12.0–15.0)
MCH: 27.8 pg (ref 26.0–34.0)
MCHC: 32 g/dL (ref 30.0–36.0)
MCV: 86.9 fL (ref 80.0–100.0)
Platelets: 426 10*3/uL — ABNORMAL HIGH (ref 150–400)
RBC: 4.64 MIL/uL (ref 3.87–5.11)
RDW: 13.2 % (ref 11.5–15.5)
WBC: 13.8 10*3/uL — ABNORMAL HIGH (ref 4.0–10.5)
nRBC: 0 % (ref 0.0–0.2)

## 2021-12-25 LAB — CREATININE, SERUM
Creatinine, Ser: 0.88 mg/dL (ref 0.44–1.00)
GFR, Estimated: >60 mL/min (ref >60)

## 2021-12-25 SURGERY — TOTAL HIP REVISION
Anesthesia: Spinal | Site: Hip | Laterality: Right

## 2021-12-25 MED ORDER — PROMETHAZINE HCL 25 MG/ML IJ SOLN
6.2500 mg | INTRAMUSCULAR | Status: DC | PRN
  Administered 2021-12-25: 6.25 mg via INTRAVENOUS

## 2021-12-25 MED ORDER — TRANEXAMIC ACID 1000 MG/10ML IV SOLN
INTRAVENOUS | Status: AC
Start: 2021-12-25 — End: ?
  Filled 2021-12-25: qty 10

## 2021-12-25 MED ORDER — FENTANYL CITRATE (PF) 100 MCG/2ML IJ SOLN
INTRAMUSCULAR | Status: AC
  Administered 2021-12-25: 50 ug via INTRAVENOUS
  Filled 2021-12-25: qty 2

## 2021-12-25 MED ORDER — HEMOSTATIC AGENTS (NO CHARGE) OPTIME
TOPICAL | Status: DC | PRN
  Administered 2021-12-25: 2 via TOPICAL

## 2021-12-25 MED ORDER — FENTANYL CITRATE (PF) 100 MCG/2ML IJ SOLN
25.0000 ug | INTRAMUSCULAR | Status: AC | PRN
  Administered 2021-12-25 (×3): 25 ug via INTRAVENOUS
  Administered 2021-12-25: 50 ug via INTRAVENOUS

## 2021-12-25 MED ORDER — CHLORHEXIDINE GLUCONATE 0.12 % MT SOLN: 15.0000 mL | Freq: Once | OROMUCOSAL | Status: AC

## 2021-12-25 MED ORDER — SODIUM CHLORIDE 0.9 % IV SOLN: INTRAVENOUS | Status: DC

## 2021-12-25 MED ORDER — FENTANYL CITRATE (PF) 100 MCG/2ML IJ SOLN
INTRAMUSCULAR | Status: AC
  Administered 2021-12-25: 25 ug via INTRAVENOUS
  Filled 2021-12-25: qty 2

## 2021-12-25 MED ORDER — HYDROMORPHONE HCL 1 MG/ML IJ SOLN
INTRAMUSCULAR | Status: AC
  Administered 2021-12-25: 1 mg via INTRAVENOUS
  Filled 2021-12-25: qty 1

## 2021-12-25 MED ORDER — CEFAZOLIN SODIUM-DEXTROSE 2-4 GM/100ML-% IV SOLN
INTRAVENOUS | Status: AC
  Filled 2021-12-25: qty 100

## 2021-12-25 MED ORDER — LIDOCAINE HCL (PF) 2 % IJ SOLN
INTRAMUSCULAR | Status: DC | PRN
  Administered 2021-12-25: 40 mg

## 2021-12-25 MED ORDER — FENTANYL CITRATE (PF) 100 MCG/2ML IJ SOLN
INTRAMUSCULAR | Status: AC
  Filled 2021-12-25: qty 2

## 2021-12-25 MED ORDER — EPHEDRINE 5 MG/ML INJ
INTRAVENOUS | Status: AC
Start: 2021-12-25 — End: ?
  Filled 2021-12-25: qty 5

## 2021-12-25 MED ORDER — MIDAZOLAM HCL 5 MG/5ML IJ SOLN
INTRAMUSCULAR | Status: DC | PRN
  Administered 2021-12-25 (×2): 2 mg via INTRAVENOUS

## 2021-12-25 MED ORDER — OXYCODONE HCL 5 MG PO TABS
5.0000 mg | ORAL_TABLET | ORAL | Status: DC | PRN
  Administered 2021-12-26: 5 mg via ORAL
  Filled 2021-12-25: qty 1

## 2021-12-25 MED ORDER — 0.9 % SODIUM CHLORIDE (POUR BTL) OPTIME
TOPICAL | Status: DC | PRN
  Administered 2021-12-25: 1000 mL

## 2021-12-25 MED ORDER — OXYCODONE HCL 5 MG/5ML PO SOLN: 5.0000 mg | Freq: Once | ORAL | Status: AC | PRN

## 2021-12-25 MED ORDER — LACTATED RINGERS IV SOLN: INTRAVENOUS | Status: DC

## 2021-12-25 MED ORDER — PROPOFOL 1000 MG/100ML IV EMUL
INTRAVENOUS | Status: AC
Start: 2021-12-25 — End: ?
  Filled 2021-12-25: qty 100

## 2021-12-25 MED ORDER — SURGIPHOR WOUND IRRIGATION SYSTEM - OPTIME
TOPICAL | Status: DC | PRN
  Administered 2021-12-25: 450 mL via TOPICAL

## 2021-12-25 MED ORDER — PHENOL 1.4 % MT LIQD: 1.0000 | OROMUCOSAL | Status: DC | PRN

## 2021-12-25 MED ORDER — METHOCARBAMOL 1000 MG/10ML IJ SOLN: 500.0000 mg | Freq: Four times a day (QID) | INTRAVENOUS | Status: DC | PRN

## 2021-12-25 MED ORDER — OXYCODONE HCL 5 MG PO TABS
10.0000 mg | ORAL_TABLET | ORAL | Status: DC | PRN
  Administered 2021-12-25: 10 mg via ORAL
  Filled 2021-12-25: qty 2

## 2021-12-25 MED ORDER — CHLORHEXIDINE GLUCONATE 0.12 % MT SOLN
OROMUCOSAL | Status: AC
  Administered 2021-12-25: 15 mL via OROMUCOSAL
  Filled 2021-12-25: qty 15

## 2021-12-25 MED ORDER — PHENYLEPHRINE HCL-NACL 20-0.9 MG/250ML-% IV SOLN
INTRAVENOUS | Status: AC
Start: 2021-12-25 — End: ?
  Filled 2021-12-25: qty 250

## 2021-12-25 MED ORDER — PAROXETINE HCL 20 MG PO TABS
40.0000 mg | ORAL_TABLET | Freq: Every day | ORAL | Status: DC
  Administered 2021-12-25: 40 mg via ORAL
  Filled 2021-12-25: qty 2

## 2021-12-25 MED ORDER — BUPIVACAINE HCL (PF) 0.25 % IJ SOLN
INTRAMUSCULAR | Status: AC
Start: 2021-12-25 — End: ?
  Filled 2021-12-25: qty 30

## 2021-12-25 MED ORDER — ORAL CARE MOUTH RINSE: 15.0000 mL | Freq: Once | OROMUCOSAL | Status: AC

## 2021-12-25 MED ORDER — ACETAMINOPHEN 500 MG PO TABS
1000.0000 mg | ORAL_TABLET | Freq: Four times a day (QID) | ORAL | Status: DC
  Administered 2021-12-25 – 2021-12-26 (×3): 1000 mg via ORAL
  Filled 2021-12-25 (×3): qty 2

## 2021-12-25 MED ORDER — MENTHOL 3 MG MT LOZG
1.0000 | LOZENGE | OROMUCOSAL | Status: DC | PRN
Start: 2021-12-25 — End: 2021-12-26

## 2021-12-25 MED ORDER — FLEET ENEMA 7-19 GM/118ML RE ENEM: 1.0000 | ENEMA | Freq: Once | RECTAL | Status: DC | PRN

## 2021-12-25 MED ORDER — ALUM & MAG HYDROXIDE-SIMETH 200-200-20 MG/5ML PO SUSP
30.0000 mL | ORAL | Status: DC | PRN
Start: 2021-12-25 — End: 2021-12-26

## 2021-12-25 MED ORDER — PHENYLEPHRINE HCL-NACL 20-0.9 MG/250ML-% IV SOLN
INTRAVENOUS | Status: DC | PRN
  Administered 2021-12-25: 50 ug/min via INTRAVENOUS

## 2021-12-25 MED ORDER — ACETAMINOPHEN 10 MG/ML IV SOLN
INTRAVENOUS | Status: AC
  Administered 2021-12-25: 1000 mg via INTRAVENOUS
  Filled 2021-12-25: qty 100

## 2021-12-25 MED ORDER — FENTANYL CITRATE (PF) 100 MCG/2ML IJ SOLN
INTRAMUSCULAR | Status: DC | PRN
  Administered 2021-12-25: 100 ug via INTRAVENOUS

## 2021-12-25 MED ORDER — TRAMADOL HCL 50 MG PO TABS
50.0000 mg | ORAL_TABLET | Freq: Four times a day (QID) | ORAL | Status: DC
  Administered 2021-12-25 – 2021-12-26 (×3): 50 mg via ORAL
  Filled 2021-12-25 (×3): qty 1

## 2021-12-25 MED ORDER — DIPHENHYDRAMINE HCL 12.5 MG/5ML PO ELIX: 12.5000 mg | ORAL_SOLUTION | ORAL | Status: DC | PRN

## 2021-12-25 MED ORDER — MIDAZOLAM HCL 2 MG/2ML IJ SOLN
INTRAMUSCULAR | Status: AC
  Filled 2021-12-25: qty 2

## 2021-12-25 MED ORDER — HYDROMORPHONE HCL 1 MG/ML IJ SOLN: 0.5000 mg | INTRAMUSCULAR | Status: DC | PRN

## 2021-12-25 MED ORDER — TRANEXAMIC ACID-NACL 1000-0.7 MG/100ML-% IV SOLN
INTRAVENOUS | Status: DC | PRN
  Administered 2021-12-25: 1000 mg via INTRAVENOUS

## 2021-12-25 MED ORDER — METHOCARBAMOL 500 MG PO TABS
500.0000 mg | ORAL_TABLET | Freq: Four times a day (QID) | ORAL | Status: DC | PRN
  Administered 2021-12-25 – 2021-12-26 (×2): 500 mg via ORAL
  Filled 2021-12-25 (×2): qty 1

## 2021-12-25 MED ORDER — BISACODYL 5 MG PO TBEC
5.0000 mg | DELAYED_RELEASE_TABLET | Freq: Every day | ORAL | Status: DC | PRN
  Administered 2021-12-26: 5 mg via ORAL
  Filled 2021-12-25: qty 1

## 2021-12-25 MED ORDER — METOCLOPRAMIDE HCL 5 MG/ML IJ SOLN
5.0000 mg | Freq: Three times a day (TID) | INTRAMUSCULAR | Status: DC | PRN
  Administered 2021-12-26: 10 mg via INTRAVENOUS
  Filled 2021-12-25: qty 2

## 2021-12-25 MED ORDER — EPHEDRINE SULFATE (PRESSORS) 50 MG/ML IJ SOLN
INTRAMUSCULAR | Status: DC | PRN
  Administered 2021-12-25 (×2): 5 mg via INTRAVENOUS
  Administered 2021-12-25: 10 mg via INTRAVENOUS
  Administered 2021-12-25: 5 mg via INTRAVENOUS

## 2021-12-25 MED ORDER — BUPIVACAINE LIPOSOME 1.3 % IJ SUSP
INTRAMUSCULAR | Status: AC
Start: 2021-12-25 — End: ?
  Filled 2021-12-25: qty 20

## 2021-12-25 MED ORDER — CEFAZOLIN SODIUM-DEXTROSE 2-4 GM/100ML-% IV SOLN
2.0000 g | INTRAVENOUS | Status: AC
  Administered 2021-12-25: 2 g via INTRAVENOUS

## 2021-12-25 MED ORDER — PANTOPRAZOLE SODIUM 40 MG PO TBEC
40.0000 mg | DELAYED_RELEASE_TABLET | Freq: Every day | ORAL | Status: DC
  Administered 2021-12-26: 40 mg via ORAL
  Filled 2021-12-25: qty 1

## 2021-12-25 MED ORDER — TRAZODONE HCL 50 MG PO TABS: 50.0000 mg | ORAL_TABLET | Freq: Every evening | ORAL | Status: DC | PRN

## 2021-12-25 MED ORDER — VERAPAMIL HCL ER 180 MG PO TBCR
180.0000 mg | EXTENDED_RELEASE_TABLET | Freq: Every day | ORAL | Status: DC
  Administered 2021-12-25: 180 mg via ORAL
  Filled 2021-12-25: qty 1

## 2021-12-25 MED ORDER — ACETAMINOPHEN 10 MG/ML IV SOLN: 1000.0000 mg | Freq: Once | INTRAVENOUS | Status: DC | PRN

## 2021-12-25 MED ORDER — ONDANSETRON HCL 4 MG/2ML IJ SOLN
4.0000 mg | Freq: Four times a day (QID) | INTRAMUSCULAR | Status: DC | PRN
  Administered 2021-12-25: 4 mg via INTRAVENOUS
  Filled 2021-12-25: qty 2

## 2021-12-25 MED ORDER — SENNOSIDES-DOCUSATE SODIUM 8.6-50 MG PO TABS
1.0000 | ORAL_TABLET | Freq: Every evening | ORAL | Status: DC | PRN
  Administered 2021-12-25: 1 via ORAL
  Filled 2021-12-25: qty 1

## 2021-12-25 MED ORDER — SODIUM CHLORIDE FLUSH 0.9 % IV SOLN
INTRAVENOUS | Status: AC
Start: 2021-12-25 — End: ?
  Filled 2021-12-25: qty 40

## 2021-12-25 MED ORDER — QUETIAPINE FUMARATE 200 MG PO TABS
200.0000 mg | ORAL_TABLET | Freq: Every day | ORAL | Status: DC
  Administered 2021-12-25: 200 mg via ORAL
  Filled 2021-12-25: qty 1

## 2021-12-25 MED ORDER — ONDANSETRON HCL 4 MG PO TABS: 4.0000 mg | ORAL_TABLET | Freq: Four times a day (QID) | ORAL | Status: DC | PRN

## 2021-12-25 MED ORDER — PHENYLEPHRINE HCL (PRESSORS) 10 MG/ML IV SOLN
INTRAVENOUS | Status: DC | PRN
  Administered 2021-12-25: 40 ug via INTRAVENOUS
  Administered 2021-12-25 (×5): 80 ug via INTRAVENOUS
  Administered 2021-12-25: 40 ug via INTRAVENOUS

## 2021-12-25 MED ORDER — SODIUM CHLORIDE (PF) 0.9 % IJ SOLN
INTRAMUSCULAR | Status: DC | PRN
  Administered 2021-12-25: 90 mL

## 2021-12-25 MED ORDER — CEFAZOLIN SODIUM-DEXTROSE 2-4 GM/100ML-% IV SOLN
INTRAVENOUS | Status: AC
  Administered 2021-12-25: 2 g via INTRAVENOUS
  Filled 2021-12-25: qty 100

## 2021-12-25 MED ORDER — ONDANSETRON HCL 4 MG/2ML IJ SOLN
INTRAMUSCULAR | Status: DC | PRN
  Administered 2021-12-25: 4 mg via INTRAVENOUS

## 2021-12-25 MED ORDER — DOCUSATE SODIUM 100 MG PO CAPS
100.0000 mg | ORAL_CAPSULE | Freq: Two times a day (BID) | ORAL | Status: DC
  Administered 2021-12-25 – 2021-12-26 (×2): 100 mg via ORAL
  Filled 2021-12-25 (×2): qty 1

## 2021-12-25 MED ORDER — OXYCODONE HCL 5 MG PO TABS
ORAL_TABLET | ORAL | Status: AC
  Administered 2021-12-25: 5 mg via ORAL
  Filled 2021-12-25: qty 1

## 2021-12-25 MED ORDER — CEFAZOLIN SODIUM-DEXTROSE 2-4 GM/100ML-% IV SOLN
2.0000 g | Freq: Four times a day (QID) | INTRAVENOUS | Status: AC
  Administered 2021-12-26: 2 g via INTRAVENOUS
  Filled 2021-12-25: qty 100

## 2021-12-25 MED ORDER — PROMETHAZINE HCL 25 MG/ML IJ SOLN
INTRAMUSCULAR | Status: AC
  Filled 2021-12-25: qty 1

## 2021-12-25 MED ORDER — EPINEPHRINE PF 1 MG/ML IJ SOLN
INTRAMUSCULAR | Status: AC
  Filled 2021-12-25: qty 1

## 2021-12-25 MED ORDER — METOCLOPRAMIDE HCL 5 MG PO TABS: 5.0000 mg | ORAL_TABLET | Freq: Three times a day (TID) | ORAL | Status: DC | PRN

## 2021-12-25 MED ORDER — BUPIVACAINE HCL (PF) 0.5 % IJ SOLN
INTRAMUSCULAR | Status: DC | PRN
  Administered 2021-12-25: 3 mL via INTRATHECAL

## 2021-12-25 MED ORDER — ENOXAPARIN SODIUM 40 MG/0.4ML IJ SOSY
40.0000 mg | PREFILLED_SYRINGE | INTRAMUSCULAR | Status: DC
  Administered 2021-12-26: 40 mg via SUBCUTANEOUS
  Filled 2021-12-25: qty 0.4

## 2021-12-25 MED ORDER — BUPIVACAINE HCL (PF) 0.5 % IJ SOLN
INTRAMUSCULAR | Status: AC
  Filled 2021-12-25: qty 10

## 2021-12-25 MED ORDER — OXYCODONE HCL 5 MG PO TABS: 5.0000 mg | ORAL_TABLET | Freq: Once | ORAL | Status: AC | PRN

## 2021-12-25 MED ORDER — PROPOFOL 500 MG/50ML IV EMUL
INTRAVENOUS | Status: DC | PRN
  Administered 2021-12-25: 75 ug/kg/min via INTRAVENOUS

## 2021-12-25 MED ORDER — ACETAMINOPHEN 325 MG PO TABS: 325.0000 mg | ORAL_TABLET | Freq: Four times a day (QID) | ORAL | Status: DC | PRN

## 2021-12-25 MED ORDER — SODIUM CHLORIDE FLUSH 0.9 % IV SOLN
INTRAVENOUS | Status: AC
  Filled 2021-12-25: qty 10

## 2021-12-25 MED ORDER — ALBUTEROL SULFATE (2.5 MG/3ML) 0.083% IN NEBU: 3.0000 mL | INHALATION_SOLUTION | Freq: Four times a day (QID) | RESPIRATORY_TRACT | Status: DC | PRN

## 2021-12-25 SURGICAL SUPPLY — 66 items
BLADE CUP REMOVAL 48 LONG (MISCELLANEOUS) IMPLANT
BLADE CUP REMOVAL 50 LONG (MISCELLANEOUS) IMPLANT
BLADE CUP REMOVAL 50 SHORT (MISCELLANEOUS) IMPLANT
BLADE CUP REMOVAL 52 LONG (MISCELLANEOUS) IMPLANT
BLADE SAGITTAL AGGR TOOTH XLG (BLADE) ×1 IMPLANT
BNDG COHESIVE 6X5 TAN ST LF (GAUZE/BANDAGES/DRESSINGS) ×3 IMPLANT
CANISTER WOUND CARE 500ML ATS (WOUND CARE) ×1 IMPLANT
CHLORAPREP W/TINT 26 (MISCELLANEOUS) ×1 IMPLANT
COOLER POLAR GLACIER W/PUMP (MISCELLANEOUS) IMPLANT
COVER BACK TABLE REUSABLE LG (DRAPES) ×1 IMPLANT
DRAPE 3/4 80X56 (DRAPES) ×3 IMPLANT
DRAPE C-ARM XRAY 36X54 (DRAPES) ×1 IMPLANT
DRAPE INCISE IOBAN 66X60 STRL (DRAPES) IMPLANT
DRAPE POUCH INSTRU U-SHP 10X18 (DRAPES) ×1 IMPLANT
DRESSING SURGICEL FIBRLLR 1X2 (HEMOSTASIS) ×2 IMPLANT
DRSG MEPILEX SACRM 8.7X9.8 (GAUZE/BANDAGES/DRESSINGS) ×1 IMPLANT
DRSG SURGICEL FIBRILLAR 1X2 (HEMOSTASIS) ×2
ELECT BLADE 6.5 EXT (BLADE) ×1 IMPLANT
ELECT REM PT RETURN 9FT ADLT (ELECTROSURGICAL) ×1
ELECTRODE REM PT RTRN 9FT ADLT (ELECTROSURGICAL) ×1 IMPLANT
GLOVE BIOGEL PI IND STRL 9 (GLOVE) ×1 IMPLANT
GLOVE BIOGEL PI INDICATOR 9 (GLOVE) ×1
GLOVE SURG SYN 9.0  PF PI (GLOVE) ×2
GLOVE SURG SYN 9.0 PF PI (GLOVE) ×2 IMPLANT
GOWN SRG 2XL LVL 4 RGLN SLV (GOWNS) ×1 IMPLANT
GOWN STRL NON-REIN 2XL LVL4 (GOWNS) ×1
GOWN STRL REUS W/ TWL LRG LVL3 (GOWN DISPOSABLE) ×1 IMPLANT
GOWN STRL REUS W/TWL LRG LVL3 (GOWN DISPOSABLE) ×1
HIP FEM HD XL 28 (Head) IMPLANT
HOLDER FOLEY CATH W/STRAP (MISCELLANEOUS) ×1 IMPLANT
HOOD PEEL AWAY FLYTE STAYCOOL (MISCELLANEOUS) ×1 IMPLANT
KIT PREVENA INCISION MGT 13 (CANNISTER) ×1 IMPLANT
LINER DUAL MOB 50MM (Liner) IMPLANT
MANIFOLD NEPTUNE II (INSTRUMENTS) ×1 IMPLANT
MARKER SKIN DUAL TIP RULER LAB (MISCELLANEOUS) IMPLANT
MAT ABSORB  FLUID 56X50 GRAY (MISCELLANEOUS) ×1
MAT ABSORB FLUID 56X50 GRAY (MISCELLANEOUS) ×1 IMPLANT
MectBlades Cup Removal 52 Long IMPLANT
MectaBlades Cup Removal IMPLANT
MectaBlades Cup Removal 50 long IMPLANT
MectaBlades Cup Removal 50 short IMPLANT
NDL SPNL 20GX3.5 QUINCKE YW (NEEDLE) ×2 IMPLANT
NEEDLE SPNL 20GX3.5 QUINCKE YW (NEEDLE) ×2 IMPLANT
NS IRRIG 1000ML POUR BTL (IV SOLUTION) ×1 IMPLANT
PACK HIP COMPR (MISCELLANEOUS) ×1 IMPLANT
PAD WRAPON POLAR SHDR XLG (MISCELLANEOUS) IMPLANT
PUTTY DBX 1CC (Putty) ×1 IMPLANT
PUTTY DBX 1CC DEPUY (Putty) IMPLANT
SCALPEL PROTECTED #10 DISP (BLADE) ×2 IMPLANT
SOLUTION IRRIG SURGIPHOR (IV SOLUTION) ×1 IMPLANT
STAPLER SKIN PROX 35W (STAPLE) ×1 IMPLANT
STRAP SAFETY 5IN WIDE (MISCELLANEOUS) ×1 IMPLANT
SUT DVC 2 QUILL PDO  T11 36X36 (SUTURE) ×1
SUT DVC 2 QUILL PDO T11 36X36 (SUTURE) ×1 IMPLANT
SUT SILK 0 (SUTURE) ×1
SUT SILK 0 30XBRD TIE 6 (SUTURE) ×1 IMPLANT
SUT V-LOC 90 ABS DVC 3-0 CL (SUTURE) ×1 IMPLANT
SUT VIC AB 1 CT1 36 (SUTURE) ×1 IMPLANT
SYR 50ML LL SCALE MARK (SYRINGE) ×2 IMPLANT
SYR BULB IRRIG 60ML STRL (SYRINGE) ×1 IMPLANT
TAPE MICROFOAM 4IN (TAPE) IMPLANT
TOWEL OR 17X26 4PK STRL BLUE (TOWEL DISPOSABLE) IMPLANT
TRAP FLUID SMOKE EVACUATOR (MISCELLANEOUS) ×1 IMPLANT
TRAY FOLEY MTR SLVR 16FR STAT (SET/KITS/TRAYS/PACK) ×1 IMPLANT
WATER STERILE IRR 1000ML POUR (IV SOLUTION) ×1 IMPLANT
WRAPON POLAR PAD SHDR XLG (MISCELLANEOUS) ×1

## 2021-12-25 NOTE — Transfer of Care (Signed)
Immediate Anesthesia Transfer of Care Note  Patient: Michele Newton  Procedure(s) Performed: Right hip revision, acetabular component & femoral head (Right: Hip)  Patient Location: PACU  Anesthesia Type:Spinal  Level of Consciousness: awake and alert   Airway & Oxygen Therapy: Patient Spontanous Breathing  Post-op Assessment: Report given to RN and Post -op Vital signs reviewed and stable  Post vital signs: Reviewed  Last Vitals:  Vitals Value Taken Time  BP    Temp    Pulse    Resp 10 12/25/21 1433  SpO2    Vitals shown include unvalidated device data.  Last Pain:         Complications: No notable events documented.

## 2021-12-25 NOTE — Progress Notes (Signed)
PT Cancellation Note  Patient Details Name: Michele Newton MRN: [id number] DOB: 18-Aug-1955   Cancelled Treatment:    Reason Eval/Treat Not Completed: Other (comment).  Chart reviewed and spoke with nursing about pt.  Pt currently in PACU and is feeling nauseated currently with bouts of emesis.  Pt will be held for therapy until the AM or as medically appropriate.    Gwenlyn Saran, PT, DPT 12/25/21, 4:28 PM

## 2021-12-25 NOTE — H&P (Signed)
Chief Complaint  Patient presents with  Right Hip - Follow-up, Pain    History of the Present Illness: Michele Newton is a 66 y.o. female here today.   The patient presents for follow-up evaluation status post prior right total hip arthroplasty by me in 08/2020. She has persisting pain. The only explanation I have found is possibly some impingement on the cup. Right hip pain, the cup is horizontal, and we talked about revising that and changing orientation. She comes back today for recheck.  The patient states her right hip pain is unchanged. She locates her pain to the groin, and in the last few weeks she has had pain in her buttock as well. She states when she sits down, her buttocks hurt, and it feels like something is sticking in her buttocks. The patient states she is limited with her walking. She cannot walk a distance without having hold on something or a cane for support. She states when she is sitting, it is not painful, but when she stands or walks, it is painful.  The patient states she was placed on a different blood pressure medication. She is not taking Saxenda for weight loss, but her insurance would not pay for it.  The patient is retired. I have reviewed past medical, surgical, social and family history, and allergies as documented in the EMR.  Past Medical History: Past Medical History:  Diagnosis Date  Acute bursitis of left shoulder 05/20/2017  Acute tension headache  Allergy  Anxiety  Anxiety and depression 06/11/2013  Last Assessment & Plan: Formatting of this note might be different from the original. Reports she has previously been on Abilify '2mg'$  and has had refills left over, has restarted on this medication within the past few weeks with improvement in anxiety/depression. Requesting to restart. Refills sent to pharmacy on file.  Anxiety state 06/11/2013  Arthritis  Chronic left hip pain 02/26/2018  Depression  Esophageal reflux 07/09/2014  Last Assessment & Plan: D/c'd  Dexilant due to cost. Omeprazole 40 mg daily for GERD. Will follow.  Essential hypertension 02/06/2018  Last Assessment & Plan: Uncontrolled hypertension. BP goal < 130/80. Pt is not working on lifestyle modifications. Taking medications tolerating well without side effects. Complications: IIH, Obesity, headache, anxiety Plan: 1. Continue taking VerapamilCR 120 mg once daily - INCREASE valsartan to 160 mg once daily - Consider adding HCTZ at next visit if remains elevated. 2. Obtain labs next v  Headache 12/19/2014  Headache disorder 12/04/2016  History of ventricular shunt 12/19/2014  Localized osteoarthrosis of left hip 08/26/2018  Advanced left hip osteoarthritis  Lumbar stenosis without neurogenic claudication 10/06/2019  Psoriasis  Psoriatic arthropathy (CMS-HCC) 07/27/2013  S/P VP shunt  Sleep disturbance 07/09/2014  Last Assessment & Plan: Refilled seroquel and asked her to read about insomnia and come up with a bed time routine to better help with sleep. Will follow.  Spondylosis without myelopathy or radiculopathy, lumbar region 09/14/2019  Status post total hip replacement, left 11/19/2018   Past Surgical History: Past Surgical History:  Procedure Laterality Date  NEUROENDOSCOPIC PLACEMENT / REPLACEMENT VENTRICULAR CATHETER W/ ATTACHMENT SHUNT / EXTERNAL DRAIN Right 1994  ARTHROPLASTY HIP TOTAL Right 07/27/2020  Dr. [name]  ear tubes  FUNCTIONAL ENDOSCOPIC SINUS SURGERY  JOINT REPLACEMENT  right ventricular shunt  TONSILLECTOMY  TUBAL LIGATION   Past Family History: Family History  Problem Relation Age of Onset  Coronary Artery Disease (Blocked arteries around heart) Mother  Arthritis Mother  Arthritis Father  Stroke Sister  Gout Brother  Medications: Current Outpatient Medications Ordered in Epic  Medication Sig Dispense Refill  albuterol 90 mcg/actuation inhaler Inhale 2 inhalations into the lungs every 6 (six) hours as needed  atorvastatin (LIPITOR) 10 MG tablet Take 1  tablet by mouth once daily  budesonide-formoteroL (SYMBICORT) 160-4.5 mcg/actuation inhaler Inhale into the lungs  cyclobenzaprine (FLEXERIL) 5 MG tablet Take 5 mg by mouth 2 (two) times daily as needed  fluticasone (FLONASE) 50 mcg/actuation nasal spray Place into one nostril  HYDROcodone-acetaminophen (NORCO) 5-325 mg tablet Take 1 tablet by mouth every 6 (six) hours as needed  montelukast (SINGULAIR) 10 mg tablet Take by mouth.  oxyCODONE (ROXICODONE) 5 MG immediate release tablet Take 1 tablet (5 mg total) by mouth every 6 (six) hours as needed for Pain 30 tablet 0  pantoprazole (PROTONIX) 40 MG DR tablet Take 40 mg by mouth once daily  PARoxetine (PAXIL) 40 MG tablet Take 1 tablet by mouth once daily  QUEtiapine (SEROQUEL) 200 MG tablet Take 1 tablet by mouth once daily  traZODone (DESYREL) 50 MG tablet Take by mouth Take 0.5-1 tablets (25-50 mg total) by mouth at bedtime as needed for sleep  valsartan (DIOVAN) 160 MG tablet Take 160 mg by mouth once daily  verapamiL (CALAN) 120 MG tablet Take by mouth Take 1 tablet (120 mg total) by mouth daily.   No current Epic-ordered facility-administered medications on file.   Allergies: Allergies  Allergen Reactions  Amitriptyline Hallucination  hallucinations  Haloperidol Other (See Comments)  Paralyzed vocal cords  Hydrocodone-Acetaminophen Itching  Venlafaxine Other (See Comments)  Amnesia, hospitalized x 2 weeks    Body mass index is 34.88 kg/m.  Review of Systems: A comprehensive 14 point ROS was performed, reviewed, and the pertinent orthopaedic findings are documented in the HPI.  Vitals:  11/30/21 0840  BP: 124/76    General Physical Examination:   General/Constitutional: No apparent distress: well-nourished and well developed. Eyes: Pupils equal, round with synchronous movement. Lungs: Clear to auscultation HEENT: Normal Vascular: No edema, swelling or tenderness, except as noted in detailed exam. Cardiac: Heart rate  and rhythm is regular. Integumentary: No impressive skin lesions present, except as noted in detailed exam. Neuro/Psych: Normal mood and affect, oriented to person, place, and time.  Musculoskeletal Examination: On exam, right hip internal rotation is 30 degrees and external rotation is 60 degrees.  Radiographs:  No new imaging studies were obtained today.  Assessment: ICD-10-CM  1. Hip pain, chronic, right M25.551  G89.29  2. Status post total hip replacement, right Z96.641   Plan:  The patient has clinical findings of right hip pain status post right total hip arthroplasty.  We discussed the patient's prior x-ray findings. I explained the only explanation I have found is possibly some impingement on the cup. The cup is somewhat horizontal. I recommend right total hip arthroplasty. I explained the surgery and postoperative course in detail. I encouraged the patient to try to exercise prior to surgery.  We will schedule the patient for right total hip arthroplasty in 3 weeks.  Surgical Risks:  The nature of the condition and the proposed procedure has been reviewed in detail with the patient. Surgical versus non-surgical options and prognosis for recovery have been reviewed and the inherent risks and benefits of each have been discussed including the risks of infection, bleeding, injury to nerves/blood vessels/tendons, incomplete relief of symptoms, persisting pain and/or stiffness, loss of function, complex regional pain syndrome, failure of the procedure, as appropriate.  Document Attestation: Pincus Sanes, have  reviewed and updated documentation for [facility], MD, utilizing Nuance DAX.    Electronically signed by Lauris Poag, MD at 12/01/2021 8:27 AM EDT  Reviewed  H+P. No changes noted.

## 2021-12-25 NOTE — Anesthesia Procedure Notes (Signed)
Spinal  Patient location during procedure: OR Start time: 12/25/2021 12:00 PM End time: 12/25/2021 12:13 PM Reason for block: surgical anesthesia Staffing Anesthesiologist: Iran Ouch, MD Resident/CRNA: Rolla Plate, CRNA Other anesthesia staff: Lenord Fellers, RN Performed by: Rolla Plate, CRNA Authorized by: Iran Ouch, MD   Preanesthetic Checklist Completed: patient identified, IV checked, site marked, risks and benefits discussed, surgical consent, monitors and equipment checked, pre-op evaluation and timeout performed Spinal Block Patient position: sitting Prep: ChloraPrep Patient monitoring: continuous pulse ox and blood pressure Approach: right paramedian Location: L4-5 Injection technique: single-shot Needle Needle type: Pencan  Needle gauge: 24 G

## 2021-12-25 NOTE — Anesthesia Preprocedure Evaluation (Addendum)
Anesthesia Evaluation  Patient identified by MRN, date of birth, ID band Patient awake    Reviewed: Allergy & Precautions, H&P , NPO status , Patient's Chart, lab work & pertinent test results  History of Anesthesia Complications (+) PONV and history of anesthetic complications  Airway Mallampati: III  TM Distance: <3 FB Neck ROM: full    Dental  (+) Chipped   Pulmonary shortness of breath, asthma ,    Pulmonary exam normal        Cardiovascular hypertension, Pt. on medications (-) angina(-) Past MI and (-) DOE Normal cardiovascular exam     Neuro/Psych  Headaches, PSYCHIATRIC DISORDERS Anxiety History of hydrocephalus that improved with shunting,  No active symptoms.  No problem with spinal in 2020.  Patient was consented for risk of shunt infection and or herniation and she voiced understanding    GI/Hepatic GERD  Medicated and Controlled,  Endo/Other    Renal/GU   negative genitourinary   Musculoskeletal  (+) Arthritis ,   Abdominal (+) + obese,   Peds  Hematology negative hematology ROS (+)   Anesthesia Other Findings Past Medical History: No date: Anxiety No date: Arthritis No date: Asthma No date: Depression No date: Frequent headaches No date: GERD (gastroesophageal reflux disease) 1995: Hydrocephalus ([place]) No date: Migraine No date: Psoriatic arthritis ([place])  Past Surgical History: 2000: BREAST SURGERY     Comment:  biopsy 1998: NASAL SINUS SURGERY 1985: TONSILLECTOMY AND ADENOIDECTOMY 11/19/2018: TOTAL HIP ARTHROPLASTY; Left     Comment:  Procedure: TOTAL HIP ARTHROPLASTY ANTERIOR APPROACH;                Surgeon: Hessie Knows, MD;  Location: [facility] ORS;                Service: Orthopedics;  Laterality: Left; No date: TUBAL LIGATION 1998: TYMPANOSTOMY TUBE PLACEMENT 1995: VENTRICULAR ATRIAL SHUNT; Right     Reproductive/Obstetrics (+) Pregnancy                             Anesthesia Physical  Anesthesia Plan  ASA: III  Anesthesia Plan: Spinal   Post-op Pain Management: Minimal or no pain anticipated   Induction: Intravenous  PONV Risk Score and Plan: Ondansetron and Dexamethasone  Airway Management Planned: Natural Airway and Nasal Cannula  Additional Equipment:   Intra-op Plan:   Post-operative Plan:   Informed Consent: I have reviewed the patients History and Physical, chart, labs and discussed the procedure including the risks, benefits and alternatives for the proposed anesthesia with the patient or authorized representative who has indicated his/her understanding and acceptance.     Dental Advisory Given  Plan Discussed with: Anesthesiologist, CRNA and Surgeon  Anesthesia Plan Comments: (.  Patient reports no bleeding problems and no anticoagulant use.  Plan for spinal with backup GA  Patient consented for risks of anesthesia including but not limited to:  - adverse reactions to medications - damage to eyes, teeth, lips or other oral mucosa - nerve damage due to positioning  - risk of bleeding, infection and or nerve damage from spinal that could lead to paralysis - risk of headache or failed spinal - damage to teeth, lips or other oral mucosa - sore throat or hoarseness - damage to heart, brain, nerves, lungs, other parts of body or loss of life  Patient voiced understanding.)       Anesthesia Quick Evaluation

## 2021-12-25 NOTE — Progress Notes (Signed)
Patient's husband on status, waiting on room assignment.

## 2021-12-25 NOTE — Op Note (Signed)
12/25/2021  2:43 PM  PATIENT:  Michele Newton  66 y.o. female  PRE-OPERATIVE DIAGNOSIS:  Hip pain, chronic, right  M25.551 Status post total hip replacement, right  Z96.641  POST-OPERATIVE DIAGNOSIS:  Hip pain, chronic, right  M25.551 Status post total hip replacement, right  Z96.641  PROCEDURE: Revision right femoral head component  SURGEON: Laurene Footman, MD  ASSISTANTS: None  ANESTHESIA:   spinal  EBL:  Total I/O In: 800 [I.V.:800] Out: 650 [Urine:300; Blood:350]  BLOOD ADMINISTERED:none  DRAINS:     Incisional wound VAC  LOCAL MEDICATIONS USED:  MARCAINE    and OTHER Exparel  SPECIMEN:  No Specimen  DISPOSITION OF SPECIMEN:  N/A  COUNTS:  YES  TOURNIQUET:  * No tourniquets in log *  IMPLANTS: Medacta 50 mm Mpact DM cup with XL 28 mm metal head  DICTATION: .Dragon Dictation   The patient was brought to the operating room and after spinal anesthesia was obtained patient was placed on the operative table with the ipsilateral foot into the Medacta attachment, contralateral leg on a well-padded table. C-arm was brought in and preop template x-ray taken. After prepping and draping in usual sterile fashion appropriate patient identification and timeout procedures were completed. Anterior approach to the hip was obtained and centered over the greater trochanter and TFL muscle.  Prior incision was utilized.  The subcutaneous tissue was incised hemostasis being achieved by electrocautery. TFL fascia was incised and the muscle retracted laterally deep retractor placed.  The anterior capsule was exposed and a capsulotomy performed.  There was extensive scar tissue debrided from the joint and there appeared to be impingement on the lateral aspect of the implant and the acetabular component.  After excision of this disc extensive scar tissue is the head was removed with some difficulty impacting it off the neck and then removing it.  Attempted to remove the cup with different cup explant  type cutting devices but the cup was solid and could not be removed as felt that was the acetabular component had not shifted and the leg had been short the pain may have been second more secondary to impingement and rather than risk of pelvic fracture or discontinuity by more extensive bone-cutting using curved osteotomes is felt just revising the neck would be appropriate.  The cup was still very stable and did not move.  Trials were utilized and an XL gave what appeared to be similarly to the contralateral side.  The XL DM components were assembled and when the trunnion was dried impacted and the hip reduced after having infiltrated the joint with Exparel throughout the case along with Marcaine.  The hip was also thoroughly irrigated during the procedure with Betadine and saline.  The hip was reduced and the abductor felt tight but on the x-ray it appeared that there was no longer be impingement with slightly increased offset and length restored some bone putty was placed superior laterally where bone had been cut away to try to get regrowth for the cup in the future.  The hip was reduced and was stable the wound was thoroughly irrigated with fibrillar placed along the posterior capsule and medial neck. The deep fascia ws closed using a heavy Quill.3-0 V-loc to close the skin with skin staples.  Incisional wound VAC applied and patient was sent to recovery in stable condition.   PLAN OF CARE: Admit to inpatient

## 2021-12-26 ENCOUNTER — Encounter: Payer: Self-pay | Admitting: Orthopedic Surgery

## 2021-12-26 LAB — BASIC METABOLIC PANEL
Anion gap: 6 (ref 5–15)
BUN: 17 mg/dL (ref 8–23)
CO2: 27 mmol/L (ref 22–32)
Calcium: 8.8 mg/dL — ABNORMAL LOW (ref 8.9–10.3)
Chloride: 106 mmol/L (ref 98–111)
Creatinine, Ser: 0.98 mg/dL (ref 0.44–1.00)
GFR, Estimated: >60 mL/min (ref >60)
Glucose, Bld: 135 mg/dL — ABNORMAL HIGH (ref 70–99)
Potassium: 4.2 mmol/L (ref 3.5–5.1)
Sodium: 139 mmol/L (ref 135–145)

## 2021-12-26 LAB — CBC
HCT: 39 % (ref 36.0–46.0)
Hemoglobin: 12.6 g/dL (ref 12.0–15.0)
MCH: 28.6 pg (ref 26.0–34.0)
MCHC: 32.3 g/dL (ref 30.0–36.0)
MCV: 88.4 fL (ref 80.0–100.0)
Platelets: 374 10*3/uL (ref 150–400)
RBC: 4.41 MIL/uL (ref 3.87–5.11)
RDW: 13.2 % (ref 11.5–15.5)
WBC: 13.2 10*3/uL — ABNORMAL HIGH (ref 4.0–10.5)
nRBC: 0 % (ref 0.0–0.2)

## 2021-12-26 MED ORDER — ACETAMINOPHEN 500 MG PO TABS: 1000.0000 mg | ORAL_TABLET | Freq: Four times a day (QID) | ORAL | 0 refills | Status: DC

## 2021-12-26 MED ORDER — OXYCODONE HCL 5 MG PO TABS: 5.0000 mg | ORAL_TABLET | ORAL | 0 refills | Status: DC | PRN

## 2021-12-26 MED ORDER — METHOCARBAMOL 500 MG PO TABS: 500.0000 mg | ORAL_TABLET | Freq: Four times a day (QID) | ORAL | 0 refills | Status: DC | PRN

## 2021-12-26 MED ORDER — DOCUSATE SODIUM 100 MG PO CAPS: 100.0000 mg | ORAL_CAPSULE | Freq: Two times a day (BID) | ORAL | 0 refills | Status: DC

## 2021-12-26 MED ORDER — TRAMADOL HCL 50 MG PO TABS: 50.0000 mg | ORAL_TABLET | Freq: Four times a day (QID) | ORAL | 0 refills | Status: DC | PRN

## 2021-12-26 MED ORDER — ENOXAPARIN SODIUM 40 MG/0.4ML IJ SOSY: 40.0000 mg | PREFILLED_SYRINGE | INTRAMUSCULAR | 0 refills | Status: DC

## 2021-12-26 NOTE — Progress Notes (Signed)
Physical Therapy Treatment Patient Details Name: Michele Newton MRN: [id number] DOB: [birth year] Today's Date: 12/26/2021   History of Present Illness Michele Newton is a 66 y/o female s/p revision of R THR on 12/25/21.    PT Comments    Pt received seated in recliner upon arrival to room and pt agreeable to therapy.  Pt able to transfer and performed well with all mobility concerns.  Pt able to ambulate around the nursing station and back to room with all needs met and call bell within reach.  Pt left with all needs met and ready to be d/c at this time.  Current discharge plans to home with HHPT remain appropriate at this time.  Pt will continue to benefit from skilled therapy in order to address deficits listed below.      Recommendations for follow up therapy are one component of a multi-disciplinary discharge planning process, led by the attending physician.  Recommendations may be updated based on patient status, additional functional criteria and insurance authorization.  Follow Up Recommendations  Home health PT     Assistance Recommended at Discharge PRN  Patient can return home with the following A little help with walking and/or transfers;A little help with bathing/dressing/bathroom;Help with stairs or ramp for entrance   Equipment Recommendations  None recommended by PT    Recommendations for Other Services       Precautions / Restrictions Precautions Precautions: Anterior Hip;Fall Restrictions Weight Bearing Restrictions: Yes RLE Weight Bearing: Weight bearing as tolerated     Mobility  Bed Mobility Overal bed mobility: Needs Assistance Bed Mobility: Supine to Sit     Supine to sit: Supervision, HOB elevated          Transfers Overall transfer level: Needs assistance Equipment used: Rolling walker (2 wheels) Transfers: Bed to chair/wheelchair/BSC   Stand pivot transfers: Supervision         General transfer comment: Stand pivot from EOB > chair with  supervision + RW    Ambulation/Gait Ambulation/Gait assistance: Supervision Gait Distance (Feet): 160 Feet Assistive device: Rolling walker (2 wheels) Gait Pattern/deviations: Step-through pattern, Decreased stance time - right, Decreased step length - left Gait velocity: decreased     General Gait Details: Slightly decreased stance time on the R LE, but good mobility.   Stairs Stairs: Yes Stairs assistance: Min guard, Supervision Stair Management: One rail Right, Step to pattern, Sideways Number of Stairs: 8 General stair comments: good carryover following verbal and visual cuing.   Wheelchair Mobility    Modified Rankin (Stroke Patients Only)       Balance Overall balance assessment: Needs assistance Sitting-balance support: Feet supported Sitting balance-Leahy Scale: Good     Standing balance support: Bilateral upper extremity supported, During functional activity Standing balance-Leahy Scale: Good Standing balance comment: use of RW to support BUE during functional mobility                            Cognition Arousal/Alertness: Awake/alert Behavior During Therapy: WFL for tasks assessed/performed Overall Cognitive Status: Within Functional Limits for tasks assessed                                 General Comments: A&Ox4, pleasant and cooperative        Exercises      General Comments        Pertinent Vitals/Pain Pain Assessment Pain Assessment: Faces Faces  Pain Scale: Hurts a little bit Pain Location: back Pain Descriptors / Indicators: Aching, Discomfort Pain Intervention(s): Monitored during session, Repositioned, Ice applied    Home Living Family/patient expects to be discharged to:: Private residence Living Arrangements: Spouse/significant other Available Help at Discharge: Family;Available 24 hours/day Type of Home: House Home Access: Stairs to enter Entrance Stairs-Rails: Psychiatric nurse of  Steps: 4   Home Layout: Two level;Able to live on main level with bedroom/bathroom Home Equipment: Rolling Walker (2 wheels);Toilet riser;Shower seat;Cane - single point;Grab bars - tub/shower;Rollator (4 wheels);Adaptive equipment Additional Comments: Husband still works but is going to be off for 1 week after D/C, sister is coming to stay with pt the following week    Prior Function            PT Goals (current goals can now be found in the care plan section) Acute Rehab PT Goals Patient Stated Goal: to go home. PT Goal Formulation: With patient/family Time For Goal Achievement: 01/09/22 Potential to Achieve Goals: Good Progress towards PT goals: Progressing toward goals    Frequency    BID      PT Plan Current plan remains appropriate    Co-evaluation              AM-PAC PT "6 Clicks" Mobility   Outcome Measure  Help needed turning from your back to your side while in a flat bed without using bedrails?: None Help needed moving from lying on your back to sitting on the side of a flat bed without using bedrails?: None Help needed moving to and from a bed to a chair (including a wheelchair)?: None Help needed standing up from a chair using your arms (e.g., wheelchair or bedside chair)?: None Help needed to walk in hospital room?: A Little Help needed climbing 3-5 steps with a railing? : A Little 6 Click Score: 22    End of Session Equipment Utilized During Treatment: Gait belt Activity Tolerance: Patient tolerated treatment well Patient left: in chair;with call bell/phone within reach;with family/visitor present Nurse Communication: Mobility status PT Visit Diagnosis: Unsteadiness on feet (R26.81);Other abnormalities of gait and mobility (R26.89);Muscle weakness (generalized) (M62.81);Difficulty in walking, not elsewhere classified (R26.2)     Time: 1450-1500 PT Time Calculation (min) (ACUTE ONLY): 10 min  Charges:  $Gait Training: 8-22 mins                      Gwenlyn Saran, PT, DPT 12/26/21, 5:11 PM

## 2021-12-26 NOTE — Evaluation (Signed)
Occupational Therapy Evaluation Patient Details Name: Michele Newton MRN: [id number] DOB: May 22, 1955 Today's Date: 12/26/2021   History of Present Illness Michele Newton is a 66 y/o female s/p revision of R THR on 12/25/21.   Clinical Impression   Patient seen for OT evaluation, husband present in room. Patient presenting with decreased strength, decreased endurance, impaired balance, and pain. At baseline, pt was Mod I for ADLs, IADLs, and functional mobility using a SPC. Patient lives with her spouse and has a sister nearby who can provide physical assistance as needed upon discharge. Patient currently functioning at set up/supervision for ADLs and supervision for OOB mobility using a RW. Pt/spouse were educated on polar care system, RLE WBAT, and AE for LB dressing. Pt/spouse demonstrated good carryover of education provided by OT. Pt was already familiar with AE, has reacher and sock aid at home. Patient will benefit from acute OT to increase overall independence in the areas of ADLs and functional mobility in order to safely discharge home.       Recommendations for follow up therapy are one component of a multi-disciplinary discharge planning process, led by the attending physician.  Recommendations may be updated based on patient status, additional functional criteria and insurance authorization.   Follow Up Recommendations  No OT follow up    Assistance Recommended at Discharge Set up Supervision/Assistance  Patient can return home with the following A little help with walking and/or transfers;A little help with bathing/dressing/bathroom;Assistance with cooking/housework;Assist for transportation;Help with stairs or ramp for entrance    Functional Status Assessment  Patient has had a recent decline in their functional status and demonstrates the ability to make significant improvements in function in a reasonable and predictable amount of time.  Equipment Recommendations  None recommended by  OT    Recommendations for Other Services       Precautions / Restrictions Precautions Precautions: Anterior Hip;Fall Restrictions Weight Bearing Restrictions: Yes RLE Weight Bearing: Weight bearing as tolerated      Mobility Bed Mobility Overal bed mobility: Needs Assistance Bed Mobility: Supine to Sit     Supine to sit: Supervision, HOB elevated          Transfers Overall transfer level: Needs assistance Equipment used: Rolling walker (2 wheels) Transfers: Bed to chair/wheelchair/BSC   Stand pivot transfers: Supervision         General transfer comment: Stand pivot from EOB > chair with supervision + RW      Balance Overall balance assessment: Needs assistance Sitting-balance support: Feet supported Sitting balance-Leahy Scale: Good     Standing balance support: Bilateral upper extremity supported, During functional activity Standing balance-Leahy Scale: Good Standing balance comment: use of RW to support BUE during functional mobility                           ADL either performed or assessed with clinical judgement   ADL Overall ADL's : Needs assistance/impaired                     Lower Body Dressing: With adaptive equipment;Set up;Supervision/safety;Sitting/lateral leans Lower Body Dressing Details (indicate cue type and reason): Pt able to thread BLEs through briefs with set up A and supervision. Toilet Transfer: Supervision/safety;Rolling walker (2 wheels) Toilet Transfer Details (indicate cue type and reason): simulated with bed to chair transfer         Functional mobility during ADLs: Supervision/safety;Rolling walker (2 wheels) General ADL Comments: Pt reports getting up  to [facility] this morning     Vision Patient Visual Report: No change from baseline       Perception     Praxis      Pertinent Vitals/Pain Pain Assessment Pain Assessment: Faces Faces Pain Scale: Hurts a little bit Pain Location: back Pain  Descriptors / Indicators: Aching, Discomfort Pain Intervention(s): Monitored during session, Repositioned     Hand Dominance Right   Extremity/Trunk Assessment Upper Extremity Assessment Upper Extremity Assessment: Overall WFL for tasks assessed   Lower Extremity Assessment Lower Extremity Assessment: RLE deficits/detail RLE Deficits / Details: s/p THR   Cervical / Trunk Assessment Cervical / Trunk Assessment: Normal   Communication Communication Communication: No difficulties   Cognition Arousal/Alertness: Awake/alert Behavior During Therapy: WFL for tasks assessed/performed Overall Cognitive Status: Within Functional Limits for tasks assessed                                 General Comments: A&Ox4, pleasant and cooperative     General Comments       Exercises     Shoulder Instructions      Home Living Family/patient expects to be discharged to:: Private residence Living Arrangements: Spouse/significant other Available Help at Discharge: Family;Available 24 hours/day Type of Home: House Home Access: Stairs to enter [employer] of Steps: 4 Entrance Stairs-Rails: Right;Left Home Layout: Two level;Able to live on main level with bedroom/bathroom     Bathroom Shower/Tub: Walk-in shower;Tub/shower unit   Bathroom Toilet: Handicapped height     Home Equipment: Conservation officer, nature (2 wheels);Toilet riser;Shower seat;Cane - single point;Grab bars - tub/shower;Rollator (4 wheels);Adaptive equipment Adaptive Equipment: Reacher;Sock aid Additional Comments: Husband still works but is going to be off for 1 week after D/C, sister is coming to stay with pt the following week      Prior Functioning/Environment Prior Level of Function : Independent/Modified Independent             Mobility Comments: Mod I with SPC within home and out in community. ADLs Comments: Mod I for ADLs/IADLs. Husband sometimes assists with LB dressing, pt has reacher and  sock aid from previous hip replacement but does not use often.        OT Problem List: Decreased strength;Decreased range of motion;Decreased activity tolerance;Impaired balance (sitting and/or standing);Pain      OT Treatment/Interventions: Self-care/ADL training;Therapeutic exercise;Patient/family education;Balance training;Energy conservation;Therapeutic activities;DME and/or AE instruction    OT Goals(Current goals can be found in the care plan section) Acute Rehab OT Goals Patient Stated Goal: return home OT Goal Formulation: With patient/family Time For Goal Achievement: 01/09/22 Potential to Achieve Goals: Good ADL Goals Pt Will Perform Grooming: with modified independence;standing Pt Will Perform Upper Body Dressing: with modified independence;sitting Pt Will Perform Lower Body Dressing: with modified independence;with adaptive equipment;sitting/lateral leans;sit to/from stand Pt Will Transfer to Toilet: with modified independence;ambulating;bedside commode Pt Will Perform Toileting - Clothing Manipulation and hygiene: with modified independence;sit to/from stand  OT Frequency: Min 2X/week    Co-evaluation              AM-PAC OT "6 Clicks" Daily Activity     Outcome Measure Help from another person eating meals?: None Help from another person taking care of personal grooming?: A Little Help from another person toileting, which includes using toliet, bedpan, or urinal?: A Little Help from another person bathing (including washing, rinsing, drying)?: A Little Help from another person to put on and  taking off regular upper body clothing?: None Help from another person to put on and taking off regular lower body clothing?: A Little 6 Click Score: 20   End of Session Equipment Utilized During Treatment: Gait belt;Rolling walker (2 wheels) Nurse Communication: Mobility status  Activity Tolerance: Patient tolerated treatment well Patient left: in chair;with call  bell/phone within reach;with chair alarm set;with family/visitor present (handoff to PT)  OT Visit Diagnosis: Other abnormalities of gait and mobility (R26.89);Muscle weakness (generalized) (M62.81);Pain Pain - Right/Left: Right Pain - part of body: Hip (and back)                Time: 1030-1052 OT Time Calculation (min): 22 min Charges:  OT General Charges $OT Visit: 1 Visit OT Evaluation $OT Eval Low Complexity: 1 Low  [facility] MS, OTR/L ascom [phone]  12/26/21, 1:35 PM

## 2021-12-26 NOTE — Progress Notes (Signed)
Met with the patient at the bedside She has dme at home but needs a 3 in 1, requested it to be delivered to the home She is set up with [facility] for [facility] She can afford her medication Her husband provides transportation

## 2021-12-26 NOTE — Anesthesia Postprocedure Evaluation (Signed)
Anesthesia Post Note  Patient: Michele Newton  Procedure(s) Performed: Right hip revision, acetabular component & femoral head (Right: Hip)  Patient location during evaluation: Nursing Unit Anesthesia Type: Spinal Level of consciousness: awake Pain management: satisfactory to patient Respiratory status: spontaneous breathing Postop Assessment: no headache Anesthetic complications: no   No notable events documented.   Last Vitals:  Vitals:   12/26/21 0332 12/26/21 0721  BP: 119/71 (!) 128/57  Pulse: 98 97  Resp: 17 18  Temp: 36.9 C 37.8 C  SpO2: (!) 88% 94%    Last Pain:  Vitals:   12/26/21 0424  TempSrc:   PainSc: 0-No pain                 Lerry Liner

## 2021-12-26 NOTE — Progress Notes (Cosign Needed)
Patient is not able to walk the distance required to go the bathroom, or he/she is unable to safely negotiate stairs required to access the bathroom.  A 3in1 BSC will alleviate this problem  

## 2021-12-26 NOTE — Progress Notes (Signed)
   Subjective: 1 Day Post-Op Procedure(s) (LRB): Right hip revision, acetabular component & femoral head (Right) Patient reports pain as mild.   Patient is well, and has had no acute complaints or problems Denies any CP, SOB, ABD pain. We will continue therapy today.  Plan is to go Home after hospital stay.  Objective: Vital signs in last 24 hours: Temp:  [96.9 F (36.1 C)-100 F (37.8 C)] 100 F (37.8 C) (08/23 0721) Pulse Rate:  [70-98] 97 (08/23 0721) Resp:  [10-18] 18 (08/23 0721) BP: (93-162)/(57-97) 128/57 (08/23 0721) SpO2:  [66 %-100 %] 94 % (08/23 0721) Weight:  [93 kg] 93 kg (08/22 1038)  Intake/Output from previous day: 08/22 0701 - 08/23 0700 In: 1750.2 [P.O.:420; I.V.:1030.2; IV Piggyback:300] Out: 935 [Urine:575; Emesis/NG output:10; Blood:350] Intake/Output this shift: No intake/output data recorded.  Recent Labs    12/25/21 2116 12/26/21 0622  HGB 12.9 12.6   Recent Labs    12/25/21 2116 12/26/21 0622  WBC 13.8* 13.2*  RBC 4.64 4.41  HCT 40.3 39.0  PLT 426* 374   Recent Labs    12/25/21 2116 12/26/21 0622  NA  --  139  K  --  4.2  CL  --  106  CO2  --  27  BUN  --  17  CREATININE 0.88 0.98  GLUCOSE  --  135*  CALCIUM  --  8.8*   No results for input(s): "LABPT", "INR" in the last 72 hours.  EXAM General - Patient is Alert, Appropriate, and Oriented Extremity - Neurovascular intact Sensation intact distally Intact pulses distally Dorsiflexion/Plantar flexion intact No cellulitis present Compartment soft Dressing - dressing C/D/I and no drainage, provena intact with 150 cc drainage Motor Function - intact, moving foot and toes well on exam.   Past Medical History:  Diagnosis Date   Anxiety    Arthritis    Asthma    Depression    Frequent headaches    GERD (gastroesophageal reflux disease)    Hydrocephalus (HCC) 1995   Hypertension    Migraine    PONV (postoperative nausea and vomiting)    Psoriatic arthritis (HCC)      Assessment/Plan:   1 Day Post-Op Procedure(s) (LRB): Right hip revision, acetabular component & femoral head (Right) Principal Problem:   S/P revision of total hip  Estimated body mass index is 33.09 kg/m as calculated from the following:   Height as of this encounter: '5\' 6"'$  (1.676 m).   Weight as of this encounter: 93 kg. Advance diet Up with therapy Labs and VSS Pain controlled CM to assist with discharge to home with HHPT pending completion of PT goals  DVT Prophylaxis - Lovenox, TED hose, and SCDs Weight-Bearing as tolerated to right leg   T. Rachelle Hora, PA-C Max 12/26/2021, 8:22 AM

## 2021-12-26 NOTE — Discharge Summary (Signed)
Physician Discharge Summary  Patient ID: Michele Newton MRN: [id number] DOB/AGE: [birth year] 66 y.o.  Admit date: 12/25/2021 Discharge date: 12/26/2021  Admission Diagnoses:  S/P revision of total hip [Z96.649]   Discharge Diagnoses: Patient Active Problem List   Diagnosis Date Noted   S/P revision of total hip 12/25/2021   Mixed hyperlipidemia 11/05/2021   Moderate episode of recurrent major depressive disorder ([place]) 10/25/2020   IBS (irritable bowel syndrome) 06/20/2020   Class 1 obesity due to excess calories with body mass index (BMI) of 33.0 to 33.9 in adult 03/13/2020   Psychophysiological insomnia 02/29/2020   Lumbar stenosis without neurogenic claudication 10/06/2019   Spondylosis without myelopathy or radiculopathy, lumbar region 09/14/2019   Localized osteoarthrosis of left hip 08/26/2018   Essential hypertension 02/06/2018   Asthma 02/06/2018   Chronic midline low back pain without sciatica 04/08/2017   Chronic bilateral low back pain with left-sided sciatica 04/08/2017   GERD (gastroesophageal reflux disease) 07/09/2014   Psoriatic arthritis ([place]) 07/27/2013   Anxiety 06/11/2013    Past Medical History:  Diagnosis Date   Anxiety    Arthritis    Asthma    Depression    Frequent headaches    GERD (gastroesophageal reflux disease)    Hydrocephalus ([place]) 1995   Hypertension    Migraine    PONV (postoperative nausea and vomiting)    Psoriatic arthritis ([place])      Transfusion: none   Consultants (if any):   Discharged Condition: Improved  Hospital Course: Michele Newton is a 66 y.o. female who was admitted 12/25/2021 with a diagnosis of S/P revision of total hip and went to the operating room on 12/25/2021 and underwent the above named procedures.    Surgeries: Procedure(s): Right hip revision, acetabular component & femoral head on 12/25/2021 Patient tolerated the surgery well. Taken to PACU where she was stabilized and then transferred to the orthopedic  floor.  Started on Lovenox 40 mg q 24 hrs. Foot pumps applied bilaterally at 80 mm. Heels elevated on bed with rolled towels. No evidence of DVT. Negative Homan. Physical therapy started on day #1 for gait training and transfer. OT started day #1 for ADL and assisted devices.  Patient's foley was d/c on day #1. Patient's IV  was d/c on day #1.  On post op day #1 patient was stable and ready for discharge to home with HHPT.   She was given perioperative antibiotics:  Anti-infectives (From admission, onward)    Start     Dose/Rate Route Frequency Ordered Stop   12/25/21 1900  ceFAZolin (ANCEF) IVPB 2g/100 mL premix        2 g 200 mL/hr over 30 Minutes Intravenous Every 6 hours 12/25/21 1850 12/26/21 0159   12/25/21 1851  ceFAZolin (ANCEF) 2-4 GM/100ML-% IVPB       Note to Pharmacy: Melinda Crutch P: cabinet override      12/25/21 1851 12/26/21 0659   12/25/21 1030  ceFAZolin (ANCEF) 2-4 GM/100ML-% IVPB       Note to Pharmacy: Register, Sharline A: cabinet override      12/25/21 1030 12/25/21 1925   12/25/21 0600  ceFAZolin (ANCEF) IVPB 2g/100 mL premix        2 g 200 mL/hr over 30 Minutes Intravenous On call to O.R. 12/25/21 0025 12/25/21 1225     .  She was given sequential compression devices, early ambulation, and Lovenox TEDs for DVT prophylaxis.  She benefited maximally from the hospital stay and there were no complications.  Recent vital signs:  Vitals:   12/26/21 0332 12/26/21 0721  BP: 119/71 (!) 128/57  Pulse: 98 97  Resp: 17 18  Temp: 98.4 F (36.9 C) 100 F (37.8 C)  SpO2: (!) 88% 94%    Recent laboratory studies:  Lab Results  Component Value Date   HGB 12.6 12/26/2021   HGB 12.9 12/25/2021   HGB 14.4 10/29/2021   Lab Results  Component Value Date   WBC 13.2 (H) 12/26/2021   PLT 374 12/26/2021   Lab Results  Component Value Date   INR 1.0 11/11/2018   Lab Results  Component Value Date   NA 139 12/26/2021   K 4.2 12/26/2021   CL 106  12/26/2021   CO2 27 12/26/2021   BUN 17 12/26/2021   CREATININE 0.98 12/26/2021   GLUCOSE 135 (H) 12/26/2021    Discharge Medications:   Allergies as of 12/26/2021       Reactions   Amitriptyline Other (See Comments)   hallucinations   Effexor [venlafaxine] Other (See Comments)   Amnesia, hospitalized x 2 weeks   Haldol [haloperidol] Other (See Comments)   Paralyzed vocal cords         Medication List     STOP taking these medications    naproxen sodium 220 MG tablet Commonly known as: ALEVE       TAKE these medications    acetaminophen 500 MG tablet Commonly known as: TYLENOL Take 2 tablets (1,000 mg total) by mouth every 6 (six) hours.   albuterol 108 (90 Base) MCG/ACT inhaler Commonly known as: VENTOLIN HFA Inhale 1-2 puffs into the lungs every 6 (six) hours as needed for wheezing or shortness of breath.   budesonide-formoterol 160-4.5 MCG/ACT inhaler Commonly known as: SYMBICORT Inhale 2 puffs into the lungs 2 (two) times daily.   docusate sodium 100 MG capsule Commonly known as: COLACE Take 1 capsule (100 mg total) by mouth 2 (two) times daily.   enoxaparin 40 MG/0.4ML injection Commonly known as: LOVENOX Inject 0.4 mLs (40 mg total) into the skin daily for 14 days. Start taking on: December 27, 2021   fluticasone 50 MCG/ACT nasal spray Commonly known as: FLONASE Place 2 sprays into both nostrils daily. Use for 4-6 weeks then stop and use seasonally or as needed.   Insulin Pen Needle 31G X 5 MM Misc BD Pen Needles- brand specific Inject insulin via insulin pen 6 x daily   methocarbamol 500 MG tablet Commonly known as: ROBAXIN Take 500 mg by mouth every 6 (six) hours as needed. What changed: Another medication with the same name was added. Make sure you understand how and when to take each.   methocarbamol 500 MG tablet Commonly known as: ROBAXIN Take 1 tablet (500 mg total) by mouth every 6 (six) hours as needed for muscle spasms. What changed:  You were already taking a medication with the same name, and this prescription was added. Make sure you understand how and when to take each.   montelukast 10 MG tablet Commonly known as: SINGULAIR TAKE 1 TABLET(10 MG) BY MOUTH AT BEDTIME   oxyCODONE 5 MG immediate release tablet Commonly known as: Oxy IR/ROXICODONE Take 1-2 tablets (5-10 mg total) by mouth every 4 (four) hours as needed for moderate pain (pain score 4-6). What changed:  how much to take when to take this reasons to take this   pantoprazole 40 MG tablet Commonly known as: PROTONIX TAKE 1 TABLET(40 MG) BY MOUTH TWICE DAILY   PARoxetine 40 MG  tablet Commonly known as: PAXIL TAKE 1 TABLET(40 MG) BY MOUTH AT BEDTIME   QUEtiapine 200 MG tablet Commonly known as: SEROQUEL TAKE 1 TABLET(200 MG) BY MOUTH AT BEDTIME   traMADol 50 MG tablet Commonly known as: ULTRAM Take 1 tablet (50 mg total) by mouth every 6 (six) hours as needed.   traZODone 50 MG tablet Commonly known as: DESYREL TAKE 1/2 TO 1 TABLET(25 TO 50 MG) BY MOUTH AT BEDTIME AS NEEDED FOR SLEEP   verapamil 180 MG CR tablet Commonly known as: CALAN-SR TAKE 1 TABLET(180 MG) BY MOUTH AT BEDTIME        Diagnostic Studies: DG HIP UNILAT W OR W/O PELVIS 2-3 VIEWS RIGHT  Result Date: 12/25/2021 CLINICAL DATA:  Revision of right total hip arthroplasty. EXAM: DG HIP (WITH OR WITHOUT PELVIS) 2-3V RIGHT COMPARISON:  Intraoperative views same date. Right hip radiographs 07/27/2020. FINDINGS: AP and cross-table lateral views of the right hip are submitted. There are postsurgical changes related to recent revision of the total hip arthroplasty with skin staples and a small amount of gas in the surrounding soft tissues. The visualized femoral component appears unchanged. The acetabular component has a horizontal orientation. No evidence of acute fracture or dislocation. IMPRESSION: No demonstrated complication following right total hip arthroplasty revision. The  acetabular component has a horizontal orientation. Electronically Signed   By: Richardean Sale M.D.   On: 12/25/2021 15:41   DG HIP UNILAT WITH PELVIS 1V RIGHT  Result Date: 12/25/2021 CLINICAL DATA:  Anterior RIGHT hip revision EXAM: DG HIP (WITH OR WITHOUT PELVIS) 1V RIGHT COMPARISON:  07/27/2020 Fluoroscopy time: 0 minutes 18 seconds Dose: 2.5319 mGy Images: 3 FINDINGS: RIGHT hip prosthesis. Diffuse osseous demineralization. No fracture, dislocation or bone destruction identified. IMPRESSION: RIGHT hip arthroplasty without acute complication. Electronically Signed   By: Lavonia Dana M.D.   On: 12/25/2021 14:49   DG C-Arm 1-60 Min-No Report  Result Date: 12/25/2021 Fluoroscopy was utilized by the requesting physician.  No radiographic interpretation.   DG C-Arm 1-60 Min-No Report  Result Date: 12/25/2021 Fluoroscopy was utilized by the requesting physician.  No radiographic interpretation.    Disposition: Discharge disposition: 06-Home-Health Care Svc          Follow-up Information     Duanne Guess, PA-C Follow up in 2 week(s).   Specialties: Orthopedic Surgery, Emergency Medicine Contact information: [place] [address] [phone]                  Signed: Feliberto Gottron 12/26/2021, 12:45 PM

## 2021-12-26 NOTE — Progress Notes (Signed)
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  Clinical Impression  Pt received seated in recliner upon arrival to room and pt agreeable to therapy following OT session.  Pt moves well and is able to fucntional transfer without any assistance required.  Pt then ambulated around the nursing station without any difficulty and was able to perform stair training as well with good carryover following verbal and visual cuing.  Pt notes she feels much better than her initial hip and is ready to be d/c home.  Current discharge plans to home with HHPT is most appropriate at this time.  Pt will continue to benefit from skilled therapy in order to address deficits listed below.         Recommendations for follow up therapy are one component of a multi-disciplinary discharge planning process, led by the attending physician.  Recommendations may be updated based on patient status, additional functional criteria and insurance authorization.  Follow Up Recommendations Home health PT      Assistance Recommended at Discharge PRN  Patient can return home with the following  A little help with walking and/or transfers;A little help with bathing/dressing/bathroom;Help with stairs or ramp for entrance    Equipment Recommendations None recommended by PT  Recommendations for Other Services       Functional Status Assessment Patient has had a recent decline in their functional status and demonstrates the ability to make significant improvements in function in a reasonable and predictable amount of time.     Precautions / Restrictions Precautions Precautions: Anterior Hip;Fall Restrictions Weight Bearing Restrictions: Yes RLE Weight Bearing: Weight bearing as tolerated      Mobility  Bed Mobility Overal bed mobility: Needs  Assistance Bed Mobility: Supine to Sit     Supine to sit: Supervision, HOB elevated          Transfers Overall transfer level: Needs assistance Equipment used: Rolling walker (2 wheels) Transfers: Bed to chair/wheelchair/BSC   Stand pivot transfers: Supervision         General transfer comment: Stand pivot from EOB > chair with supervision + RW    Ambulation/Gait Ambulation/Gait assistance: Supervision Gait Distance (Feet): 160 Feet Assistive device: Rolling walker (2 wheels) Gait Pattern/deviations: Step-through pattern, Decreased stance time - right, Decreased step length - left Gait velocity: decreased     General Gait Details: Slightly decreased stance time on the R LE, but good mobility.  Stairs Stairs: Yes Stairs assistance: Min guard, Supervision Stair Management: One rail Right, Step to pattern, Sideways Number of Stairs: 8 General stair comments: good carryover following verbal and visual cuing.  Wheelchair Mobility    Modified Rankin (Stroke Patients Only)       Balance Overall balance assessment: Needs assistance Sitting-balance support: Feet supported Sitting balance-Leahy Scale: Good     Standing balance support: Bilateral upper extremity supported, During functional activity Standing balance-Leahy Scale: Good Standing balance comment: use of RW to support BUE during functional mobility                             Pertinent Vitals/Pain Pain Assessment Pain Assessment: Faces Faces Pain Scale: Hurts a little bit Pain Location: back Pain Descriptors / Indicators: Aching, Discomfort Pain Intervention(s): Monitored during session, Repositioned, Ice applied    Home Living Family/patient expects to  be discharged to:: Private residence Living Arrangements: Spouse/significant other Available Help at Discharge: Family;Available 24 hours/day Type of Home: House Home Access: Stairs to enter Entrance Stairs-Rails:  Psychiatric nurse of Steps: 4   Home Layout: Two level;Able to live on main level with bedroom/bathroom Home Equipment: Rolling Walker (2 wheels);Toilet riser;Shower seat;Cane - single point;Grab bars - tub/shower;Rollator (4 wheels);Adaptive equipment Additional Comments: Husband still works but is going to be off for 1 week after D/C, sister is coming to stay with pt the following week    Prior Function Prior Level of Function : Independent/Modified Independent             Mobility Comments: Mod I with SPC within home and out in community. ADLs Comments: Mod I for ADLs/IADLs. Husband sometimes assists with LB dressing, pt has reacher and sock aid from previous hip replacement but does not use often.     Hand Dominance   Dominant Hand: Right    Extremity/Trunk Assessment   Upper Extremity Assessment Upper Extremity Assessment: Overall WFL for tasks assessed    Lower Extremity Assessment Lower Extremity Assessment: RLE deficits/detail RLE Deficits / Details: s/p THR    Cervical / Trunk Assessment Cervical / Trunk Assessment: Normal  Communication   Communication: No difficulties  Cognition Arousal/Alertness: Awake/alert Behavior During Therapy: WFL for tasks assessed/performed Overall Cognitive Status: Within Functional Limits for tasks assessed                                 General Comments: A&Ox4, pleasant and cooperative        General Comments      Exercises     Assessment/Plan    PT Assessment Patient needs continued PT services  PT Problem List Decreased strength;Decreased range of motion;Decreased balance;Decreased mobility       PT Treatment Interventions DME instruction;Gait training;Stair training;Functional mobility training;Therapeutic activities;Therapeutic exercise;Balance training;Neuromuscular re-education    PT Goals (Current goals can be found in the Care Plan section)  Acute Rehab PT Goals Patient Stated  Goal: to go home. PT Goal Formulation: With patient/family Time For Goal Achievement: 01/09/22 Potential to Achieve Goals: Good    Frequency BID     Co-evaluation               AM-PAC PT "6 Clicks" Mobility  Outcome Measure Help needed turning from your back to your side while in a flat bed without using bedrails?: None Help needed moving from lying on your back to sitting on the side of a flat bed without using bedrails?: None Help needed moving to and from a bed to a chair (including a wheelchair)?: None Help needed standing up from a chair using your arms (e.g., wheelchair or bedside chair)?: None Help needed to walk in hospital room?: A Little Help needed climbing 3-5 steps with a railing? : A Little 6 Click Score: 22    End of Session Equipment Utilized During Treatment: Gait belt Activity Tolerance: Patient tolerated treatment well Patient left: in chair;with call bell/phone within reach;with family/visitor present Nurse Communication: Mobility status PT Visit Diagnosis: Unsteadiness on feet (R26.81);Other abnormalities of gait and mobility (R26.89);Muscle weakness (generalized) (M62.81);Difficulty in walking, not elsewhere classified (R26.2)    Time: 1478-2956 PT Time Calculation (min) (ACUTE ONLY): 20 min   Charges:   PT Evaluation $PT Eval Low Complexity: 1 Low PT Treatments $Gait Training: 8-22 mins        Gwenlyn Saran, PT,  DPT 12/26/21, 1:52 PM

## 2021-12-26 NOTE — Plan of Care (Signed)

## 2021-12-26 NOTE — Discharge Instructions (Signed)

## 2021-12-27 ENCOUNTER — Telehealth: Payer: Self-pay

## 2021-12-27 NOTE — Telephone Encounter (Signed)
Will discuss at upcoming appt.

## 2021-12-27 NOTE — Telephone Encounter (Signed)
Transition Care Management Follow-up Telephone Call Date of discharge and from where: [facility] 8/23 How have you been since you were released from the hospital? ok Any questions or concerns? No  Items Reviewed: Did the pt receive and understand the discharge instructions provided? Yes  Medications obtained and verified? Yes  Other? No  Any new allergies since your discharge? No  Dietary orders reviewed? No Do you have support at home? Yes   Home Care and Equipment/Supplies: Were home health services ordered? yes If so, what is the name of the agency? No idea Has the agency set up a time to come to the patient's home? yes  Functional Questionnaire: (I = Independent and D = Dependent) ADLs: I  Bathing/Dressing- I  Meal Prep- I  Eating- I  Maintaining continence- I  Transferring/Ambulation- I  Managing Meds- I  Follow up appointments reviewed:  PCP Hospital f/u appt confirmed? Yes  Scheduled to see Michele Newton  on 01/10/23 @ 10:20. [facility] f/u appt confirmed? Yes  Scheduled to see ortho in 2 weeks, unsure of date/time Are transportation arrangements needed? No  If their condition worsens, is the pt aware to call PCP or go to the Emergency Dept.? Yes Was the patient provided with contact information for the PCP's office or ED? Yes Was to pt encouraged to call back with questions or concerns? Yes

## 2022-01-09 ENCOUNTER — Encounter: Payer: Self-pay | Admitting: Internal Medicine

## 2022-01-09 ENCOUNTER — Ambulatory Visit (INDEPENDENT_AMBULATORY_CARE_PROVIDER_SITE_OTHER): Payer: Medicare HMO | Admitting: Internal Medicine

## 2022-01-09 VITALS — BP 136/84 | HR 90 | Temp 97.1°F | Wt 204.0 lb

## 2022-01-09 DIAGNOSIS — E6609 Other obesity due to excess calories: Secondary | ICD-10-CM

## 2022-01-09 DIAGNOSIS — Z6832 Body mass index (BMI) 32.0-32.9, adult: Secondary | ICD-10-CM

## 2022-01-09 DIAGNOSIS — Z96641 Presence of right artificial hip joint: Secondary | ICD-10-CM | POA: Diagnosis not present

## 2022-01-09 NOTE — Assessment & Plan Note (Signed)
Encourage weight loss as this will help with reduce pressure on the right hip joint

## 2022-01-09 NOTE — Progress Notes (Signed)
Subjective:    Patient ID: Michele Newton, female    DOB: 1955/08/22, 66 y.o.   MRN: [id number]  HPI  Patient presents to clinic today for hospital follow-up.  She went to the hospital 8/22 for a planned right hip revision by Dr. [name].  She was discharged on 8/23. She still has some pain. She has some redness round her incision but denies drainage or warmth. She denies swelling in her lower leg or pain in her right calf. She is walking with a rolling walker. She declined to do PT.  She has a follow-up with orthopedics today.  Review of Systems     Past Medical History:  Diagnosis Date   Anxiety    Arthritis    Asthma    Depression    Frequent headaches    GERD (gastroesophageal reflux disease)    Hydrocephalus (HCC) 1995   Hypertension    Migraine    PONV (postoperative nausea and vomiting)    Psoriatic arthritis (HCC)     Current Outpatient Medications  Medication Sig Dispense Refill   acetaminophen (TYLENOL) 500 MG tablet Take 2 tablets (1,000 mg total) by mouth every 6 (six) hours. 30 tablet 0   albuterol (VENTOLIN HFA) 108 (90 Base) MCG/ACT inhaler Inhale 1-2 puffs into the lungs every 6 (six) hours as needed for wheezing or shortness of breath. 18 g 1   budesonide-formoterol (SYMBICORT) 160-4.5 MCG/ACT inhaler Inhale 2 puffs into the lungs 2 (two) times daily. (Patient not taking: Reported on 12/25/2021) 1 each 3   docusate sodium (COLACE) 100 MG capsule Take 1 capsule (100 mg total) by mouth 2 (two) times daily. 10 capsule 0   enoxaparin (LOVENOX) 40 MG/0.4ML injection Inject 0.4 mLs (40 mg total) into the skin daily for 14 days. 5.6 mL 0   fluticasone (FLONASE) 50 MCG/ACT nasal spray Place 2 sprays into both nostrils daily. Use for 4-6 weeks then stop and use seasonally or as needed. 16 g 3   Insulin Pen Needle 31G X 5 MM MISC BD Pen Needles- brand specific Inject insulin via insulin pen 6 x daily 30 each 0   methocarbamol (ROBAXIN) 500 MG tablet Take 500 mg by mouth every 6  (six) hours as needed.     methocarbamol (ROBAXIN) 500 MG tablet Take 1 tablet (500 mg total) by mouth every 6 (six) hours as needed for muscle spasms. 30 tablet 0   montelukast (SINGULAIR) 10 MG tablet TAKE 1 TABLET(10 MG) BY MOUTH AT BEDTIME 90 tablet 0   oxyCODONE (OXY IR/ROXICODONE) 5 MG immediate release tablet Take 1-2 tablets (5-10 mg total) by mouth every 4 (four) hours as needed for moderate pain (pain score 4-6). 40 tablet 0   pantoprazole (PROTONIX) 40 MG tablet TAKE 1 TABLET(40 MG) BY MOUTH TWICE DAILY 180 tablet 3   PARoxetine (PAXIL) 40 MG tablet TAKE 1 TABLET(40 MG) BY MOUTH AT BEDTIME 90 tablet 0   QUEtiapine (SEROQUEL) 200 MG tablet TAKE 1 TABLET(200 MG) BY MOUTH AT BEDTIME 90 tablet 0   traMADol (ULTRAM) 50 MG tablet Take 1 tablet (50 mg total) by mouth every 6 (six) hours as needed. 30 tablet 0   traZODone (DESYREL) 50 MG tablet TAKE 1/2 TO 1 TABLET(25 TO 50 MG) BY MOUTH AT BEDTIME AS NEEDED FOR SLEEP 30 tablet 2   verapamil (CALAN-SR) 180 MG CR tablet TAKE 1 TABLET(180 MG) BY MOUTH AT BEDTIME 90 tablet 0   No current facility-administered medications for this visit.    Allergies  Allergen Reactions   Amitriptyline Other (See Comments)    hallucinations   Effexor [Venlafaxine] Other (See Comments)    Amnesia, hospitalized x 2 weeks   Haldol [Haloperidol] Other (See Comments)    Paralyzed vocal cords     Family History  Problem Relation Age of Onset   Arthritis Mother    Hyperlipidemia Mother    Heart disease Mother 92       Died of heart failure   Hypertension Mother    Diabetes Mother    Depression Mother    Arthritis Father    Cancer Father        prostate cancer   Diabetes Brother    Breast cancer Maternal Grandmother    Colon cancer Neg Hx     Social History   Socioeconomic History   Marital status: Married    Spouse name: [name]   Number of children: 2   Years of education: 14   Highest education level: Not on file  Occupational History     Employer: OTHER  Tobacco Use   Smoking status: Never   Smokeless tobacco: Never  Vaping Use   Vaping Use: Never used  Substance and Sexual Activity   Alcohol use: No   Drug use: No   Sexual activity: Not Currently    Birth control/protection: Surgical  Other Topics Concern   Not on file  Social History Narrative   Ardeth lives at home with her husband, [name], of 5 months and a cat named, Puff. She has 2 sons from a previous marriage. She has 4 grandchildren.    Social Determinants of Health   Financial Resource Strain: Low Risk  (09/14/2021)   Overall Financial Resource Strain (CARDIA)    Difficulty of Paying Living Expenses: Not hard at all  Food Insecurity: No Food Insecurity (09/14/2021)   Hunger Vital Sign    Worried About Running Out of Food in the Last Year: Never true    Ran Out of Food in the Last Year: Never true  Transportation Needs: No Transportation Needs (09/14/2021)   PRAPARE - Hydrologist (Medical): No    Lack of Transportation (Non-Medical): No  Physical Activity: Insufficiently Active (09/14/2021)   Exercise Vital Sign    Days of Exercise per Week: 2 days    Minutes of Exercise per Session: 20 min  Stress: No Stress Concern Present (09/14/2021)   [place]    Feeling of Stress : Only a little  Social Connections: Moderately Isolated (09/14/2021)   Social Connection and Isolation Panel [NHANES]    Frequency of Communication with Friends and Family: Three times a week    Frequency of Social Gatherings with Friends and Family: Once a week    Attends Religious Services: Never    Marine scientist or Organizations: No    Attends Archivist Meetings: Never    Marital Status: Married  Human resources officer Violence: Not At Risk (09/14/2021)   Humiliation, Afraid, Rape, and Kick questionnaire    Fear of Current or Ex-Partner: No    Emotionally Abused: No     Physically Abused: No    Sexually Abused: No     Constitutional: Denies fever, malaise, fatigue, headache or abrupt weight changes.  Respiratory: Denies difficulty breathing, shortness of breath, cough or sputum production.   Cardiovascular: Denies chest pain, chest tightness, palpitations or swelling in the hands or feet.  Musculoskeletal: Patient reports right hip pain  and swelling.  Denies decrease in range of motion, difficulty with gait, muscle pain. Skin: Patient reports redness around incision.  Denies rashes, lesions or ulcercations.  Neurological: Denies numbness, tingling, weakness or problems with balance and coordination.    No other specific complaints in a complete review of systems (except as listed in HPI above).  Objective:   Physical Exam  BP 136/84 (BP Location: Left Arm, Patient Position: Sitting, Cuff Size: Normal)   Pulse 90   Temp (!) 97.1 F (36.2 C) (Temporal)   Wt 204 lb (92.5 kg)   SpO2 99%   BMI 32.93 kg/m   Wt Readings from Last 3 Encounters:  12/25/21 205 lb (93 kg)  12/14/21 208 lb 8.9 oz (94.6 kg)  10/29/21 206 lb (93.4 kg)    General: Appears her stated age, obese, in NAD. Skin: Incision to the right anterior hip well approximated without drainage.  Slight surrounding redness but no concern for infection at this time.  Cardiovascular: Normal rate and rhythm. S1,S2 noted.  No murmur, rubs or gallops noted. No JVD or BLE edema.  Pulmonary/Chest: Normal effort and positive vesicular breath sounds. No respiratory distress. No wheezes, rales or ronchi noted.  Musculoskeletal: Gait slow and steady with use of rolling walker. Neurological: Alert and oriented.   BMET    Component Value Date/Time   NA 139 12/26/2021 0622   K 4.2 12/26/2021 0622   CL 106 12/26/2021 0622   CO2 27 12/26/2021 0622   GLUCOSE 135 (H) 12/26/2021 0622   BUN 17 12/26/2021 0622   CREATININE 0.98 12/26/2021 0622   CREATININE 0.86 10/29/2021 0947   CALCIUM 8.8 (L)  12/26/2021 0622   GFRNONAA >60 12/26/2021 0622   GFRNONAA 69 10/09/2017 1024   GFRAA >60 11/20/2018 0408   GFRAA 79 10/09/2017 1024    Lipid Panel     Component Value Date/Time   CHOL 268 (H) 10/29/2021 0947   TRIG 212 (H) 10/29/2021 0947   HDL 47 (L) 10/29/2021 0947   CHOLHDL 5.7 (H) 10/29/2021 0947   VLDL 33.0 02/20/2017 0841   LDLCALC 181 (H) 10/29/2021 0947    CBC    Component Value Date/Time   WBC 13.2 (H) 12/26/2021 0622   RBC 4.41 12/26/2021 0622   HGB 12.6 12/26/2021 0622   HCT 39.0 12/26/2021 0622   PLT 374 12/26/2021 0622   MCV 88.4 12/26/2021 0622   MCH 28.6 12/26/2021 0622   MCHC 32.3 12/26/2021 0622   RDW 13.2 12/26/2021 0622   LYMPHSABS 2.2 07/18/2020 1054   MONOABS 0.8 07/18/2020 1054   EOSABS 0.1 07/18/2020 1054   BASOSABS 0.1 07/18/2020 1054    Hgb A1C Lab Results  Component Value Date   HGBA1C 5.5 10/29/2021            Assessment & Plan:  Hospital Follow-up for Right Hip Revision:  Hospital notes, labs and imaging reviewed Clinically she is doing well She will follow-up with orthopedics as planned  RTC in 3 months for follow-up chronic conditions [name], NP

## 2022-01-22 ENCOUNTER — Other Ambulatory Visit: Payer: Self-pay | Admitting: Internal Medicine

## 2022-01-22 DIAGNOSIS — F419 Anxiety disorder, unspecified: Secondary | ICD-10-CM

## 2022-01-22 DIAGNOSIS — G4709 Other insomnia: Secondary | ICD-10-CM

## 2022-01-23 ENCOUNTER — Other Ambulatory Visit: Payer: Self-pay | Admitting: Internal Medicine

## 2022-01-23 DIAGNOSIS — J4520 Mild intermittent asthma, uncomplicated: Secondary | ICD-10-CM

## 2022-01-23 NOTE — Telephone Encounter (Signed)
Requested medication (s) are due for refill today: no  Requested medication (s) are on the active medication list: yes  Last refill:  12/19/21 #90  Future visit scheduled: no  Notes to clinic:  medication not delegated to NT to reorder this med   Requested Prescriptions  Pending Prescriptions Disp Refills   QUEtiapine (SEROQUEL) 200 MG tablet [Pharmacy Med Name: QUETIAPINE 200MG TABLETS] 90 tablet     Sig: TAKE 1 TABLET(200 MG) BY MOUTH AT BEDTIME     Not Delegated - Psychiatry:  Antipsychotics - Second Generation (Atypical) - quetiapine Failed - 01/22/2022  8:18 PM      Failed - This refill cannot be delegated      Failed - Lipid Panel in normal range within the last 12 months    Cholesterol  Date Value Ref Range Status  10/29/2021 268 (H) <200 mg/dL Final   LDL Cholesterol (Calc)  Date Value Ref Range Status  10/29/2021 181 (H) mg/dL (calc) Final    Comment:    Reference range: <100 . Desirable range <100 mg/dL for primary prevention;   <70 mg/dL for patients with CHD or diabetic patients  with > or = 2 CHD risk factors. Marland Kitchen LDL-C is now calculated using the Martin-Hopkins  calculation, which is a validated novel method providing  better accuracy than the Friedewald equation in the  estimation of LDL-C.  [name] et al. Annamaria Helling. 7793;903(00): 2061-2068  (http://education.[facility].com/faq/FAQ164)    HDL  Date Value Ref Range Status  10/29/2021 47 (L) > OR = 50 mg/dL Final   Triglycerides  Date Value Ref Range Status  10/29/2021 212 (H) <150 mg/dL Final    Comment:    . If a non-fasting specimen was collected, consider repeat triglyceride testing on a fasting specimen if clinically indicated.  [name] et al. J. of Clin. Lipidol. 9233;0:076-226. .          Failed - CBC within normal limits and completed in the last 12 months    WBC  Date Value Ref Range Status  12/26/2021 13.2 (H) 4.0 - 10.5 K/uL Final   RBC  Date Value Ref Range Status   12/26/2021 4.41 3.87 - 5.11 MIL/uL Final   Hemoglobin  Date Value Ref Range Status  12/26/2021 12.6 12.0 - 15.0 g/dL Final   HCT  Date Value Ref Range Status  12/26/2021 39.0 36.0 - 46.0 % Final   MCHC  Date Value Ref Range Status  12/26/2021 32.3 30.0 - 36.0 g/dL Final   [facility]  Date Value Ref Range Status  12/26/2021 28.6 26.0 - 34.0 pg Final   MCV  Date Value Ref Range Status  12/26/2021 88.4 80.0 - 100.0 fL Final   No results found for: "PLTCOUNTKUC", "LABPLAT", "POCPLA" RDW  Date Value Ref Range Status  12/26/2021 13.2 11.5 - 15.5 % Final         Failed - CMP within normal limits and completed in the last 12 months    Albumin  Date Value Ref Range Status  07/18/2020 4.1 3.5 - 5.0 g/dL Final   Alkaline Phosphatase  Date Value Ref Range Status  07/18/2020 81 38 - 126 U/L Final   Alkaline phosphatase (APISO)  Date Value Ref Range Status  10/29/2021 85 37 - 153 U/L Final   ALT  Date Value Ref Range Status  10/29/2021 23 6 - 29 U/L Final   AST  Date Value Ref Range Status  10/29/2021 14 10 - 35 U/L Final   BUN  Date  Value Ref Range Status  12/26/2021 17 8 - 23 mg/dL Final   Calcium  Date Value Ref Range Status  12/26/2021 8.8 (L) 8.9 - 10.3 mg/dL Final   CO2  Date Value Ref Range Status  12/26/2021 27 22 - 32 mmol/L Final   Creat  Date Value Ref Range Status  10/29/2021 0.86 0.50 - 1.05 mg/dL Final   Creatinine, Ser  Date Value Ref Range Status  12/26/2021 0.98 0.44 - 1.00 mg/dL Final   Glucose, Bld  Date Value Ref Range Status  12/26/2021 135 (H) 70 - 99 mg/dL Final    Comment:    Glucose reference range applies only to samples taken after fasting for at least 8 hours.   Potassium  Date Value Ref Range Status  12/26/2021 4.2 3.5 - 5.1 mmol/L Final   Sodium  Date Value Ref Range Status  12/26/2021 139 135 - 145 mmol/L Final   Total Bilirubin  Date Value Ref Range Status  10/29/2021 0.3 0.2 - 1.2 mg/dL Final   Protein, ur  Date  Value Ref Range Status  12/14/2021 NEGATIVE NEGATIVE mg/dL Final   Total Protein  Date Value Ref Range Status  10/29/2021 6.6 6.1 - 8.1 g/dL Final   GFR, Est African American  Date Value Ref Range Status  10/09/2017 79 > OR = 60 mL/min/1.48m Final   GFR calc Af Amer  Date Value Ref Range Status  11/20/2018 >60 >60 mL/min Final   GFR  Date Value Ref Range Status  02/20/2017 76.18 >60.00 mL/min Final   eGFR  Date Value Ref Range Status  10/29/2021 74 > OR = 60 mL/min/1.741mFinal    Comment:    The eGFR is based on the CKD-EPI 2021 equation. To calculate  the new eGFR from a previous Creatinine or Cystatin C result, go to https://www.kidney.org/professionals/ kdoqi/gfr%5Fcalculator    GFR, Est Non African American  Date Value Ref Range Status  10/09/2017 69 > OR = 60 mL/min/1.7374minal   GFR, Estimated  Date Value Ref Range Status  12/26/2021 >60 >60 mL/min Final    Comment:    (NOTE) Calculated using the CKD-EPI Creatinine Equation (2021)          Passed - TSH in normal range and within 360 days    TSH  Date Value Ref Range Status  04/23/2021 1.21 0.40 - 4.50 mIU/L Final         Passed - Completed PHQ-2 or PHQ-9 in the last 360 days      Passed - Last BP in normal range    BP Readings from Last 1 Encounters:  01/09/22 136/84         Passed - Last Heart Rate in normal range    Pulse Readings from Last 1 Encounters:  01/09/22 90         Passed - Valid encounter within last 6 months    Recent Outpatient Visits           2 weeks ago History of revision of total replacement of right hip joint   [facility]   2 months ago [facility] for colon cancer   [facility]   4 months ago Dysphagia, unspecified type   [facility]   8 months ago Acute non-recurrent frontal sinusitis   [facility]   9 months ago  Hot flashes due to menopause  [facility] [place], [place] W, NP              Refused Prescriptions Disp Refills   PARoxetine (PAXIL) 40 MG tablet [Pharmacy Med Name: PAROXETINE 40MG TABLETS] 90 tablet     Sig: TAKE 1 TABLET(40 MG) BY MOUTH AT BEDTIME     Psychiatry:  Antidepressants - SSRI Passed - 01/22/2022  8:18 PM      Passed - Completed PHQ-2 or PHQ-9 in the last 360 days      Passed - Valid encounter within last 6 months    Recent Outpatient Visits           2 weeks ago History of revision of total replacement of right hip joint   [place], [name], NP   2 months ago Screening for colon cancer   [place], [name], NP   4 months ago Dysphagia, unspecified type   [facility] [place], [name], NP   8 months ago Acute non-recurrent frontal sinusitis   [place], DO   9 months ago Hot flashes due to menopause   [facility] [place], [name], NP

## 2022-01-23 NOTE — Telephone Encounter (Signed)
Requested Prescriptions  Pending Prescriptions Disp Refills  . QUEtiapine (SEROQUEL) 200 MG tablet [Pharmacy Med Name: QUETIAPINE 200MG TABLETS] 90 tablet     Sig: TAKE 1 TABLET(200 MG) BY MOUTH AT BEDTIME     Not Delegated - Psychiatry:  Antipsychotics - Second Generation (Atypical) - quetiapine Failed - 01/22/2022  8:18 PM      Failed - This refill cannot be delegated      Failed - Lipid Panel in normal range within the last 12 months    Cholesterol  Date Value Ref Range Status  10/29/2021 268 (H) <200 mg/dL Final   LDL Cholesterol (Calc)  Date Value Ref Range Status  10/29/2021 181 (H) mg/dL (calc) Final    Comment:    Reference range: <100 . Desirable range <100 mg/dL for primary prevention;   <70 mg/dL for patients with CHD or diabetic patients  with > or = 2 CHD risk factors. Marland Kitchen LDL-C is now calculated using the Martin-Hopkins  calculation, which is a validated novel method providing  better accuracy than the Friedewald equation in the  estimation of LDL-C.  [name] et al. Annamaria Helling. 9833;825(05): 2061-2068  (http://education.[facility].com/faq/FAQ164)    HDL  Date Value Ref Range Status  10/29/2021 47 (L) > OR = 50 mg/dL Final   Triglycerides  Date Value Ref Range Status  10/29/2021 212 (H) <150 mg/dL Final    Comment:    . If a non-fasting specimen was collected, consider repeat triglyceride testing on a fasting specimen if clinically indicated.  [name] et al. J. of Clin. Lipidol. 3976;7:341-937. .          Failed - CBC within normal limits and completed in the last 12 months    WBC  Date Value Ref Range Status  12/26/2021 13.2 (H) 4.0 - 10.5 K/uL Final   RBC  Date Value Ref Range Status  12/26/2021 4.41 3.87 - 5.11 MIL/uL Final   Hemoglobin  Date Value Ref Range Status  12/26/2021 12.6 12.0 - 15.0 g/dL Final   HCT  Date Value Ref Range Status  12/26/2021 39.0 36.0 - 46.0 % Final   MCHC  Date Value Ref Range Status  12/26/2021 32.3 30.0  - 36.0 g/dL Final   [facility]  Date Value Ref Range Status  12/26/2021 28.6 26.0 - 34.0 pg Final   MCV  Date Value Ref Range Status  12/26/2021 88.4 80.0 - 100.0 fL Final   No results found for: "PLTCOUNTKUC", "LABPLAT", "POCPLA" RDW  Date Value Ref Range Status  12/26/2021 13.2 11.5 - 15.5 % Final         Failed - CMP within normal limits and completed in the last 12 months    Albumin  Date Value Ref Range Status  07/18/2020 4.1 3.5 - 5.0 g/dL Final   Alkaline Phosphatase  Date Value Ref Range Status  07/18/2020 81 38 - 126 U/L Final   Alkaline phosphatase (APISO)  Date Value Ref Range Status  10/29/2021 85 37 - 153 U/L Final   ALT  Date Value Ref Range Status  10/29/2021 23 6 - 29 U/L Final   AST  Date Value Ref Range Status  10/29/2021 14 10 - 35 U/L Final   BUN  Date Value Ref Range Status  12/26/2021 17 8 - 23 mg/dL Final   Calcium  Date Value Ref Range Status  12/26/2021 8.8 (L) 8.9 - 10.3 mg/dL Final   CO2  Date Value Ref Range Status  12/26/2021 27 22 - 32 mmol/L Final  Creat  Date Value Ref Range Status  10/29/2021 0.86 0.50 - 1.05 mg/dL Final   Creatinine, Ser  Date Value Ref Range Status  12/26/2021 0.98 0.44 - 1.00 mg/dL Final   Glucose, Bld  Date Value Ref Range Status  12/26/2021 135 (H) 70 - 99 mg/dL Final    Comment:    Glucose reference range applies only to samples taken after fasting for at least 8 hours.   Potassium  Date Value Ref Range Status  12/26/2021 4.2 3.5 - 5.1 mmol/L Final   Sodium  Date Value Ref Range Status  12/26/2021 139 135 - 145 mmol/L Final   Total Bilirubin  Date Value Ref Range Status  10/29/2021 0.3 0.2 - 1.2 mg/dL Final   Protein, ur  Date Value Ref Range Status  12/14/2021 NEGATIVE NEGATIVE mg/dL Final   Total Protein  Date Value Ref Range Status  10/29/2021 6.6 6.1 - 8.1 g/dL Final   GFR, Est African American  Date Value Ref Range Status  10/09/2017 79 > OR = 60 mL/min/1.52m Final   GFR  calc Af Amer  Date Value Ref Range Status  11/20/2018 >60 >60 mL/min Final   GFR  Date Value Ref Range Status  02/20/2017 76.18 >60.00 mL/min Final   eGFR  Date Value Ref Range Status  10/29/2021 74 > OR = 60 mL/min/1.739mFinal    Comment:    The eGFR is based on the CKD-EPI 2021 equation. To calculate  the new eGFR from a previous Creatinine or Cystatin C result, go to https://www.kidney.org/professionals/ kdoqi/gfr%5Fcalculator    GFR, Est Non African American  Date Value Ref Range Status  10/09/2017 69 > OR = 60 mL/min/1.7390minal   GFR, Estimated  Date Value Ref Range Status  12/26/2021 >60 >60 mL/min Final    Comment:    (NOTE) Calculated using the CKD-EPI Creatinine Equation (2021)          Passed - TSH in normal range and within 360 days    TSH  Date Value Ref Range Status  04/23/2021 1.21 0.40 - 4.50 mIU/L Final         Passed - Completed PHQ-2 or PHQ-9 in the last 360 days      Passed - Last BP in normal range    BP Readings from Last 1 Encounters:  01/09/22 136/84         Passed - Last Heart Rate in normal range    Pulse Readings from Last 1 Encounters:  01/09/22 90         Passed - Valid encounter within last 6 months    Recent Outpatient Visits          2 weeks ago History of revision of total replacement of right hip joint   [facility]   2 months ago [facility]   4 months ago Dysphagia, unspecified type   [facility]   8 months ago Acute non-recurrent frontal sinusitis   [facility]   9 months ago Hot flashes due to menopause   [facility]             . PARoxetine (PAXIL) 40 MG tablet [Pharmacy Med Name: PAROXETINE 40MG TABLETS] 90 tablet     Sig: TAKE 1 TABLET(40 MG) BY MOUTH AT BEDTIME  Psychiatry:   Antidepressants - SSRI Passed - 01/22/2022  8:18 PM      Passed - Completed PHQ-2 or PHQ-9 in the last 360 days      Passed - Valid encounter within last 6 months    Recent Outpatient Visits          2 weeks ago History of revision of total replacement of right hip joint   [place], [name], NP   2 months ago Screening for colon cancer   [facility] [place], [name], NP   4 months ago Dysphagia, unspecified type   [facility] [place], [name], NP   8 months ago Acute non-recurrent frontal sinusitis   [place], DO   9 months ago Hot flashes due to menopause   [facility] [place], [name], [place]

## 2022-01-23 NOTE — Telephone Encounter (Signed)
Refused losartan 50 mg tablets because it was discontinued 12/21/2020.  Not on medication list.

## 2022-01-29 ENCOUNTER — Ambulatory Visit: Payer: Self-pay | Admitting: *Deleted

## 2022-01-29 NOTE — Telephone Encounter (Signed)
   Chief Complaint: fever 100.8 today, was 104 post op hip 5 weeks ago. Symptoms: fatigue, cough, productive white phlegmn Frequency: started after surgery Pertinent Negatives: Patient denies sick contacts Disposition: '[]'$ ED /'[]'$ Urgent Care (no appt availability in office) / '[x]'$ Appointment(In office/virtual)/ '[]'$  [place] [facility]/ '[]'$ Home Care/ '[]'$ Refused Recommended Disposition /'[]'$ [place] [facility]/ '[]'$  Follow-up with PCP Additional Notes: Pt treated with antibiotic for post op fever and has been low grade since but not gone away. Appt made, pt has at home covid test, I asked her to test just so we would know ahead of time if she is positive. She is to call back.  Reason for Disposition  Fever present > 3 days (72 hours)  Answer Assessment - Initial Assessment Questions 1. TEMPERATURE: "What is the most recent temperature?"  "How was it measured?"      100.8, was 104 a few days ago, saw surgeon put on keflex 2. ONSET: "When did the fever start?"      5 weeks since hip surgery 3. CHILLS: "Do you have chills?" If yes: "How bad are they?"  (e.g., none, mild, moderate, severe)   - NONE: no chills   - MILD: feeling cold   - MODERATE: feeling very cold, some shivering (feels better under a thick blanket)   - SEVERE: feeling extremely cold with shaking chills (general body shaking, rigors; even under a thick blanket)      moderately 4. OTHER SYMPTOMS: "Do you have any other symptoms besides the fever?"  (e.g., abdomen pain, cough, diarrhea, earache, headache, sore throat, urination pain)     Headache, cough productive white 5. CAUSE: If there are no symptoms, ask: "What do you think is causing the fever?"      Thinks it is not related to surgery 6. CONTACTS: "Does anyone else in the family have an infection?"     no 7. TREATMENT: "What have you done so far to treat this fever?" (e.g., medications)     tylenol 8. IMMUNOCOMPROMISE: "Do you have of the following: diabetes, HIV positive,  splenectomy, cancer chemotherapy, chronic steroid treatment, transplant patient, etc."     no 9. PREGNANCY: "Is there any chance you are pregnant?" "When was your last menstrual period?"     no 10. TRAVEL: "Have you traveled out of the country in the last month?" (e.g., travel history, exposures)       no  Protocols used: Fever-A-AH

## 2022-01-31 ENCOUNTER — Ambulatory Visit
Admission: RE | Admit: 2022-01-31 | Discharge: 2022-01-31 | Disposition: A | Discharge status: Home / Self Care | Payer: Medicare HMO | Source: Ambulatory Visit | Attending: Internal Medicine | Admitting: Internal Medicine

## 2022-01-31 ENCOUNTER — Other Ambulatory Visit: Payer: Self-pay | Admitting: Internal Medicine

## 2022-01-31 ENCOUNTER — Ambulatory Visit
Admission: RE | Admit: 2022-01-31 | Discharge: 2022-01-31 | Disposition: A | Discharge status: Home / Self Care | Payer: Medicare HMO | Attending: Internal Medicine | Admitting: Internal Medicine

## 2022-01-31 ENCOUNTER — Ambulatory Visit (INDEPENDENT_AMBULATORY_CARE_PROVIDER_SITE_OTHER): Payer: Medicare HMO | Admitting: Internal Medicine

## 2022-01-31 ENCOUNTER — Encounter: Payer: Self-pay | Admitting: Internal Medicine

## 2022-01-31 VITALS — BP 136/80 | HR 78 | Temp 98.1°F | Wt 208.0 lb

## 2022-01-31 DIAGNOSIS — R0602 Shortness of breath: Secondary | ICD-10-CM

## 2022-01-31 DIAGNOSIS — R509 Fever, unspecified: Secondary | ICD-10-CM | POA: Diagnosis not present

## 2022-01-31 DIAGNOSIS — R519 Headache, unspecified: Secondary | ICD-10-CM

## 2022-01-31 DIAGNOSIS — H9203 Otalgia, bilateral: Secondary | ICD-10-CM | POA: Diagnosis not present

## 2022-01-31 DIAGNOSIS — R0989 Other specified symptoms and signs involving the circulatory and respiratory systems: Secondary | ICD-10-CM | POA: Diagnosis not present

## 2022-01-31 DIAGNOSIS — R059 Cough, unspecified: Secondary | ICD-10-CM | POA: Diagnosis not present

## 2022-01-31 DIAGNOSIS — R42 Dizziness and giddiness: Secondary | ICD-10-CM | POA: Diagnosis not present

## 2022-01-31 DIAGNOSIS — R051 Acute cough: Secondary | ICD-10-CM | POA: Diagnosis not present

## 2022-01-31 LAB — POCT URINALYSIS DIPSTICK
Bilirubin, UA: NEGATIVE
Blood, UA: NEGATIVE
Glucose, UA: NEGATIVE
Ketones, UA: NEGATIVE
Leukocytes, UA: NEGATIVE
Nitrite, UA: NEGATIVE
Protein, UA: NEGATIVE
Spec Grav, UA: 1.015 (ref 1.010–1.025)
Urobilinogen, UA: 0.2 E.U./dL
pH, UA: 5 (ref 5.0–8.0)

## 2022-01-31 MED ORDER — PREDNISONE 10 MG PO TABS: 10.0000 mg | ORAL_TABLET | Freq: Every day | ORAL | 0 refills | Status: DC

## 2022-01-31 NOTE — Addendum Note (Signed)
Addended by: Ashley Royalty E on: 01/31/2022 09:57 AM   Modules accepted: Orders

## 2022-01-31 NOTE — Patient Instructions (Signed)
Fever, Adult     A fever is an increase in the body's temperature. It is usually defined as a temperature of 100.33F (38C) or higher. Brief mild or moderate fevers generally have no long-term effects, and they often do not need treatment. Moderate or high fevers may make you feel uncomfortable and can sometimes be a sign of a serious illness or disease. The sweating that may occur with repeated or prolonged fever may also cause a loss of fluid in the body (dehydration). Fever is confirmed by taking a temperature with a thermometer. A measured temperature can vary with: Age. Time of day. Where in the body you take the temperature. Readings may vary if you place the thermometer: In the mouth (oral). In the rectum (rectal). In the ear (tympanic). Under the arm (axillary). On the forehead (temporal). Follow these instructions at home: Medicines Take over-the counter and prescription medicines only as told by your health care provider. Follow the dosing instructions carefully. If you were prescribed an antibiotic medicine, take it as told by your health care provider. Do not stop taking the antibiotic even if you start to feel better. General instructions Watch your condition for any changes. Let your health care provider know about them. Rest as needed. Drink enough fluid to keep your urine pale yellow. This helps to prevent dehydration. Sponge yourself or bathe with room-temperature water to help reduce your body temperature as needed. Do not use ice water. Do not use too many blankets or wear clothes that are too heavy. If your fever may be caused by an infection that spreads from person to person (is contagious), such as a cold or the flu, you should stay home from work and public gatherings for at least 24 hours after your fever is gone. Your fever should be gone without the need to use medicines. Contact a health care provider if: You vomit. You cannot eat or drink without vomiting. You  have diarrhea. You have pain when you urinate. Your symptoms do not improve with treatment. You develop new symptoms. You develop excessive weakness. Get help right away if: You have shortness of breath or have trouble breathing. You are dizzy or you faint. You are disoriented or confused. You develop signs of dehydration, such as: Dark urine, very little urine, or no urine. Cracked lips. Dry mouth. Sunken eyes. Sleepiness. Weakness. You develop severe pain in your abdomen. You have persistent vomiting or diarrhea. You develop a skin rash. Your symptoms suddenly get worse. Summary A fever is an increase in the body's temperature. It is usually defined as a temperature of 100.33F (38C) or higher. Moderate or high fevers can sometimes be a sign of a serious illness or disease. The sweating that may occur with repeated or prolonged fever may also cause dehydration. Pay attention to any changes in your symptoms and contact your health care provider if your symptoms do not improve with treatment. Take over-the counter and prescription medicines only as told by your health care provider. Follow the dosing instructions carefully. If your fever is from an infection that may be contagious, such as cold or flu, you should stay home from work and public gatherings for at least 24 hours after your fever is gone. Your fever should be gone without the need to use medicines. Get help right away if you develop signs of dehydration, such as dark urine, cracked lips, dry mouth, sunken eyes, sleepiness, or weakness. This information is not intended to replace advice given to you by  your health care provider. Make sure you discuss any questions you have with your health care provider. Document Revised: 08/20/2021 Document Reviewed: 09/12/2020 Elsevier Patient Education  [place].

## 2022-01-31 NOTE — Progress Notes (Signed)
Subjective:    Patient ID: Michele Newton, female    DOB: Sep 12, 1955, 66 y.o.   MRN: [id number]  HPI  Patient presents to clinic today with plaint of fever.  She reports this started 5 weeks ago.  She underwent a right total hip revision by Dr. [name] on 12/25/2021. She reports she tends to run a fever in the evenings. She reports her fever has been as high as 104. She reports she can tell when she has a fever because she gets a headache. The headache is located in her forehead. She describes the pain as pressure. She reports associated dizziness but denies vision changes. She has a runny nose, ear pain and cough. The cough his productive of white/yellow mucous. She has some mild shortness of breath. She denies nasal congestion, sore throat, chest pain. She intermittently has chills but denies body aches. She has tried using a nebulizer and taking Tylenol OTC with minimal relief of symptoms. She had a negative home covid test. She has not had sick contacts.   Review of Systems     Past Medical History:  Diagnosis Date   Anxiety    Arthritis    Asthma    Depression    Frequent headaches    GERD (gastroesophageal reflux disease)    Hydrocephalus (HCC) 1995   Hypertension    Migraine    PONV (postoperative nausea and vomiting)    Psoriatic arthritis (HCC)     Current Outpatient Medications  Medication Sig Dispense Refill   acetaminophen (TYLENOL) 500 MG tablet Take 2 tablets (1,000 mg total) by mouth every 6 (six) hours. 30 tablet 0   albuterol (VENTOLIN HFA) 108 (90 Base) MCG/ACT inhaler Inhale 1-2 puffs into the lungs every 6 (six) hours as needed for wheezing or shortness of breath. 18 g 1   budesonide-formoterol (SYMBICORT) 160-4.5 MCG/ACT inhaler Inhale 2 puffs into the lungs 2 (two) times daily. 1 each 3   docusate sodium (COLACE) 100 MG capsule Take 1 capsule (100 mg total) by mouth 2 (two) times daily. 10 capsule 0   enoxaparin (LOVENOX) 40 MG/0.4ML injection Inject 0.4 mLs (40 mg  total) into the skin daily for 14 days. 5.6 mL 0   fluticasone (FLONASE) 50 MCG/ACT nasal spray Place 2 sprays into both nostrils daily. Use for 4-6 weeks then stop and use seasonally or as needed. 16 g 3   methocarbamol (ROBAXIN) 500 MG tablet Take 1 tablet (500 mg total) by mouth every 6 (six) hours as needed for muscle spasms. 30 tablet 0   montelukast (SINGULAIR) 10 MG tablet TAKE 1 TABLET(10 MG) BY MOUTH AT BEDTIME 90 tablet 0   oxyCODONE (OXY IR/ROXICODONE) 5 MG immediate release tablet Take 1-2 tablets (5-10 mg total) by mouth every 4 (four) hours as needed for moderate pain (pain score 4-6). 40 tablet 0   pantoprazole (PROTONIX) 40 MG tablet TAKE 1 TABLET(40 MG) BY MOUTH TWICE DAILY 180 tablet 3   PARoxetine (PAXIL) 40 MG tablet TAKE 1 TABLET(40 MG) BY MOUTH AT BEDTIME 90 tablet 0   QUEtiapine (SEROQUEL) 200 MG tablet TAKE 1 TABLET(200 MG) BY MOUTH AT BEDTIME 90 tablet 0   traMADol (ULTRAM) 50 MG tablet Take 1 tablet (50 mg total) by mouth every 6 (six) hours as needed. 30 tablet 0   traZODone (DESYREL) 50 MG tablet TAKE 1/2 TO 1 TABLET(25 TO 50 MG) BY MOUTH AT BEDTIME AS NEEDED FOR SLEEP 30 tablet 2   verapamil (CALAN-SR) 180 MG CR tablet TAKE  1 TABLET(180 MG) BY MOUTH AT BEDTIME 90 tablet 0   No current facility-administered medications for this visit.    Allergies  Allergen Reactions   Amitriptyline Other (See Comments)    hallucinations   Effexor [Venlafaxine] Other (See Comments)    Amnesia, hospitalized x 2 weeks   Haldol [Haloperidol] Other (See Comments)    Paralyzed vocal cords     Family History  Problem Relation Age of Onset   Arthritis Mother    Hyperlipidemia Mother    Heart disease Mother 64       Died of heart failure   Hypertension Mother    Diabetes Mother    Depression Mother    Arthritis Father    Cancer Father        prostate cancer   Diabetes Brother    Breast cancer Maternal Grandmother    Colon cancer Neg Hx     Social History   Socioeconomic  History   Marital status: Married    Spouse name: [name]   Number of children: 2   Years of education: 14   Highest education level: Not on file  Occupational History    Employer: OTHER  Tobacco Use   Smoking status: Never   Smokeless tobacco: Never  Vaping Use   Vaping Use: Never used  Substance and Sexual Activity   Alcohol use: No   Drug use: No   Sexual activity: Not Currently    Birth control/protection: Surgical  Other Topics Concern   Not on file  Social History Narrative   Michele Newton lives at home with her husband, [name], of 5 months and a cat named, Puff. She has 2 sons from a previous marriage. She has 4 grandchildren.    Social Determinants of Health   Financial Resource Strain: Low Risk  (09/14/2021)   Overall Financial Resource Strain (CARDIA)    Difficulty of Paying Living Expenses: Not hard at all  Food Insecurity: No Food Insecurity (09/14/2021)   Hunger Vital Sign    Worried About Running Out of Food in the Last Year: Never true    Ran Out of Food in the Last Year: Never true  Transportation Needs: No Transportation Needs (09/14/2021)   PRAPARE - Hydrologist (Medical): No    Lack of Transportation (Non-Medical): No  Physical Activity: Insufficiently Active (09/14/2021)   Exercise Vital Sign    Days of Exercise per Week: 2 days    Minutes of Exercise per Session: 20 min  Stress: No Stress Concern Present (09/14/2021)   [place]    Feeling of Stress : Only a little  Social Connections: Moderately Isolated (09/14/2021)   Social Connection and Isolation Panel [NHANES]    Frequency of Communication with Friends and Family: Three times a week    Frequency of Social Gatherings with Friends and Family: Once a week    Attends Religious Services: Never    Marine scientist or Organizations: No    Attends Archivist Meetings: Never    Marital Status: Married   Human resources officer Violence: Not At Risk (09/14/2021)   Humiliation, Afraid, Rape, and Kick questionnaire    Fear of Current or Ex-Partner: No    Emotionally Abused: No    Physically Abused: No    Sexually Abused: No     Constitutional: Patient reports recurrent fever, headache.  Denies malaise, fatigue, or abrupt weight changes.  HEENT: Pt reports runny nose, ear  pain. Denies eye pain, eye redness, ringing in the ears, wax buildup, nasal congestion, bloody nose, or sore throat. Respiratory: Pt reports cough and shortness of breath. Denies difficulty breathing.   Cardiovascular: Denies chest pain, chest tightness, palpitations or swelling in the hands or feet.  Gastrointestinal: Denies abdominal pain, bloating, constipation, diarrhea or blood in the stool.  Musculoskeletal: Denies decrease in range of motion, difficulty with gait, muscle pain or joint pain and swelling.  Skin: Denies redness, rashes, lesions or ulcercations.  Neurological: Pt reports intermittent dizziness. Denies  difficulty with memory, difficulty with speech or problems with balance and coordination.    No other specific complaints in a complete review of systems (except as listed in HPI above).  Objective:   Physical Exam BP 136/80 (BP Location: Left Arm, Patient Position: Sitting, Cuff Size: Large)   Pulse 78   Temp 98.1 F (36.7 C) (Oral)   Wt 208 lb (94.3 kg)   SpO2 98%   BMI 33.57 kg/m   Wt Readings from Last 3 Encounters:  01/09/22 204 lb (92.5 kg)  12/25/21 205 lb (93 kg)  12/14/21 208 lb 8.9 oz (94.6 kg)    General: Appears her stated age, obese, in NAD. Skin: Warm, dry and intact. No rashes noted. HEENT: Head: normal shape and size; Eyes: sclera white, no icterus, conjunctiva pink, PERRLA and EOMs intact; Ears: Tm's gray, hole noted in right TM, normal light reflex; Throat/Mouth: Teeth present, mucosa pink and moist, no exudate, lesions or ulcerations noted.  Neck:  No adenopathy  noted. Cardiovascular: Normal rate and rhythm. S1,S2 noted.  No murmur, rubs or gallops noted. N Pulmonary/Chest: Normal effort and positive vesicular breath sounds. Wheezing noted in the RUL. No respiratory distress. No rales or ronchi noted.  Musculoskeletal:  No difficulty with gait.  Neurological: Alert and oriented.   BMET    Component Value Date/Time   NA 139 12/26/2021 0622   K 4.2 12/26/2021 0622   CL 106 12/26/2021 0622   CO2 27 12/26/2021 0622   GLUCOSE 135 (H) 12/26/2021 0622   BUN 17 12/26/2021 0622   CREATININE 0.98 12/26/2021 0622   CREATININE 0.86 10/29/2021 0947   CALCIUM 8.8 (L) 12/26/2021 0622   GFRNONAA >60 12/26/2021 0622   GFRNONAA 69 10/09/2017 1024   GFRAA >60 11/20/2018 0408   GFRAA 79 10/09/2017 1024    Lipid Panel     Component Value Date/Time   CHOL 268 (H) 10/29/2021 0947   TRIG 212 (H) 10/29/2021 0947   HDL 47 (L) 10/29/2021 0947   CHOLHDL 5.7 (H) 10/29/2021 0947   VLDL 33.0 02/20/2017 0841   LDLCALC 181 (H) 10/29/2021 0947    CBC    Component Value Date/Time   WBC 13.2 (H) 12/26/2021 0622   RBC 4.41 12/26/2021 0622   HGB 12.6 12/26/2021 0622   HCT 39.0 12/26/2021 0622   PLT 374 12/26/2021 0622   MCV 88.4 12/26/2021 0622   MCH 28.6 12/26/2021 0622   MCHC 32.3 12/26/2021 0622   RDW 13.2 12/26/2021 0622   LYMPHSABS 2.2 07/18/2020 1054   MONOABS 0.8 07/18/2020 1054   EOSABS 0.1 07/18/2020 1054   BASOSABS 0.1 07/18/2020 1054    Hgb A1C Lab Results  Component Value Date   HGBA1C 5.5 10/29/2021            Assessment & Plan:   Fever of Unknown Origin, Headache, Dizziness, Runny Nose, Ear Pain, Cough, Shortness of Breath:  Exam benign No fever today but she reports this  often happens at night Will check CBC, CMET Urinalysis normal Chest xray today for further evaluation RX for Prednisone 10 mg daily x 5 days for asthma exacerbation Consider referral to ID if symptoms persist  RTC in 3 months for follow-up of chronic  conditions [name], NP

## 2022-01-31 NOTE — Telephone Encounter (Signed)
D/D 12/15/10. Requested Prescriptions  Refused Prescriptions Disp Refills  . atorvastatin (LIPITOR) 10 MG tablet [Pharmacy Med Name: ATORVASTATIN '10MG'$  TABLETS] 90 tablet 0    Sig: TAKE 1 TABLET(10 MG) BY MOUTH DAILY     Cardiovascular:  Antilipid - Statins Failed - 01/31/2022  3:19 AM      Failed - Lipid Panel in normal range within the last 12 months    Cholesterol  Date Value Ref Range Status  10/29/2021 268 (H) <200 mg/dL Final   LDL Cholesterol (Calc)  Date Value Ref Range Status  10/29/2021 181 (H) mg/dL (calc) Final    Comment:    Reference range: <100 . Desirable range <100 mg/dL for primary prevention;   <70 mg/dL for patients with CHD or diabetic patients  with > or = 2 CHD risk factors. Marland Kitchen LDL-C is now calculated using the Martin-Hopkins  calculation, which is a validated novel method providing  better accuracy than the Friedewald equation in the  estimation of LDL-C.  [name] et al. Annamaria Helling. 1610;960(45): 2061-2068  (http://education.[facility].com/faq/FAQ164)    HDL  Date Value Ref Range Status  10/29/2021 47 (L) > OR = 50 mg/dL Final   Triglycerides  Date Value Ref Range Status  10/29/2021 212 (H) <150 mg/dL Final    Comment:    . If a non-fasting specimen was collected, consider repeat triglyceride testing on a fasting specimen if clinically indicated.  [name] et al. J. of Clin. Lipidol. 4098;1:191-478. Marland Kitchen          Passed - Patient is not pregnant      Passed - Valid encounter within last 12 months    Recent Outpatient Visits          Today Fever of unknown origin   [place], [place], NP   3 weeks ago History of revision of total replacement of right hip joint   [facility], [name], NP   3 months ago Screening for colon cancer   [facility], [name], NP   4 months ago Dysphagia, unspecified type   [facility], [name], NP   8 months ago Acute  non-recurrent frontal sinusitis   [facility] [place], Devonne Doughty, [place]

## 2022-02-01 LAB — COMPLETE METABOLIC PANEL WITH GFR
AG Ratio: 1.7 (calc) (ref 1.0–2.5)
ALT: 32 U/L — ABNORMAL HIGH (ref 6–29)
AST: 20 U/L (ref 10–35)
Albumin: 4.2 g/dL (ref 3.6–5.1)
Alkaline phosphatase (APISO): 134 U/L (ref 37–153)
BUN: 13 mg/dL (ref 7–25)
CO2: 26 mmol/L (ref 20–32)
Calcium: 9.5 mg/dL (ref 8.6–10.4)
Chloride: 105 mmol/L (ref 98–110)
Creat: 0.84 mg/dL (ref 0.50–1.05)
Globulin: 2.5 g/dL (calc) (ref 1.9–3.7)
Glucose, Bld: 93 mg/dL (ref 65–99)
Potassium: 4.9 mmol/L (ref 3.5–5.3)
Sodium: 140 mmol/L (ref 135–146)
Total Bilirubin: 0.4 mg/dL (ref 0.2–1.2)
Total Protein: 6.7 g/dL (ref 6.1–8.1)
eGFR: 77 mL/min/{1.73_m2} (ref >=60)

## 2022-02-01 LAB — CBC
HCT: 41.6 % (ref 35.0–45.0)
Hemoglobin: 13.7 g/dL (ref 11.7–15.5)
MCH: 28.6 pg (ref 27.0–33.0)
MCHC: 32.9 g/dL (ref 32.0–36.0)
MCV: 86.8 fL (ref 80.0–100.0)
MPV: 9.1 fL (ref 7.5–12.5)
Platelets: 400 10*3/uL (ref 140–400)
RBC: 4.79 10*6/uL (ref 3.80–5.10)
RDW: 13 % (ref 11.0–15.0)
WBC: 8.3 10*3/uL (ref 3.8–10.8)

## 2022-02-06 ENCOUNTER — Other Ambulatory Visit: Payer: Self-pay

## 2022-02-06 DIAGNOSIS — M1611 Unilateral primary osteoarthritis, right hip: Secondary | ICD-10-CM | POA: Diagnosis not present

## 2022-02-06 DIAGNOSIS — Z96649 Presence of unspecified artificial hip joint: Secondary | ICD-10-CM | POA: Diagnosis not present

## 2022-02-07 ENCOUNTER — Ambulatory Visit: Payer: PRIVATE HEALTH INSURANCE | Admitting: Gastroenterology

## 2022-02-07 ENCOUNTER — Other Ambulatory Visit: Payer: Medicare HMO

## 2022-02-11 ENCOUNTER — Ambulatory Visit: Payer: Self-pay | Admitting: *Deleted

## 2022-02-11 NOTE — Telephone Encounter (Addendum)
  Chief Complaint: URI symptoms Symptoms: fever 100-101 every afternoon Frequency: same symptoms as when she saw [place] 10 days ago. Pertinent Negatives: Patient denies colored sputum, it is very thick and very white Disposition: '[]'$ ED /'[]'$ Urgent Care (no appt availability in office) / '[]'$ Appointment(In office/virtual)/ '[]'$  [place] [facility]/ '[]'$ Home Care/ '[]'$ Refused Recommended Disposition /'[]'$ [place] [facility]/ '[x]'$  Follow-up with PCP Additional Notes: Pt saw Rollene Fare for same symptoms less than 2 weeks ago, took 5 days of prednisone with a little improvement and then it came back. Pt requesting to try antibiotic instead of prednisone.   Answer Assessment - Initial Assessment Questions 1. ONSET: "When did the nasal discharge start?"      A month at least 2. AMOUNT: "How much discharge is there?"      Very thick white stuff she coughs up 3. COUGH: "Do you have a cough?" If Yes, ask: "Describe the color of your sputum" (clear, white, yellow, green)     White and thick 4. RESPIRATORY DISTRESS: "Describe your breathing."      A little wheezing 5. FEVER: "Do you have a fever?" If Yes, ask: "What is your temperature, how was it measured, and when did it start?"     100.6 - 101 6. SEVERITY: "Overall, how bad are you feeling right now?" (e.g., doesn't interfere with normal activities, staying home from school/work, staying in bed)      Weak, tired slight wheezing 7. OTHER SYMPTOMS: "Do you have any other symptoms?" (e.g., sore throat, earache, wheezing, vomiting)     Earrache and ST 8. PREGNANCY: "Is there any chance you are pregnant?" "When was your last menstrual period?"     no  Protocols used: Common Cold-A-AH

## 2022-02-12 NOTE — Telephone Encounter (Addendum)
LMTCB 02/12/2022.  PEC please advise what symptoms she is experiencing now.   Thanks,   -Mickel Baas

## 2022-02-12 NOTE — Telephone Encounter (Signed)
Can you call and see exactly what set him she is experiencing now?

## 2022-02-13 ENCOUNTER — Ambulatory Visit: Payer: Self-pay | Admitting: *Deleted

## 2022-02-13 MED ORDER — AMOXICILLIN-POT CLAVULANATE 875-125 MG PO TABS: 1.0000 | ORAL_TABLET | Freq: Two times a day (BID) | ORAL | 0 refills | Status: DC

## 2022-02-13 NOTE — Telephone Encounter (Signed)
We will try antibiotics. She should follow up with me if symptoms do not improve.

## 2022-02-13 NOTE — Telephone Encounter (Signed)
Pt advised.   Thanks,   -Zeynep Fantroy  

## 2022-02-13 NOTE — Telephone Encounter (Signed)
See Triage note dated for 02/13/2022.   Thanks,   -Mickel Baas

## 2022-02-13 NOTE — Telephone Encounter (Signed)
  Chief Complaint: fever, cough, headaches Symptoms: fever- 100.0 now. Measured with "forehead" thermometer. Checked on husband for normal reading.with elevated temps gets chills and shaking in afternoons.  Reports "bad" headaches when fever goes up in afternoons. Cough noted productive with thick white sputum. Runny nose, sore throat. Shortness of breath at rest. Reports difficulty breathing last night used inhaler and helped with breathing. Reports hip surgery within the past month. "Ever since surgery I have been sick" Frequency: 1 month  Pertinent Negatives: Patient denies chest pain no difficulty breathing now. Temp 100.0.  Disposition: '[x]'$ ED /'[]'$ Urgent Care (no appt availability in office) / '[]'$ Appointment(In office/virtual)/ '[]'$  [place] [facility]/ '[]'$ Home Care/ '[]'$ Refused Recommended Disposition /'[]'$ [place] [facility]/ '[x]'$  Follow-up with PCP Additional Notes:   PCP requesting patient to call back and review sx. See previous encounter from 02/12/22. Treated with prednisone. No available appt today. Please advise . Patient would like a call back before going to ED.  Reason for Disposition  [1] Fever > 100.0 F (37.8 C) AND [2] surgery in the last month  Answer Assessment - Initial Assessment Questions 1. TEMPERATURE: "What is the most recent temperature?"  "How was it measured?"      100.0 measured with forehead thermometer 2. ONSET: "When did the fever start?"      1 month  3. CHILLS: "Do you have chills?" If yes: "How bad are they?"  (e.g., none, mild, moderate, severe)   - NONE: no chills   - MILD: feeling cold   - MODERATE: feeling very cold, some shivering (feels better under a thick blanket)   - SEVERE: feeling extremely cold with shaking chills (general body shaking, rigors; even under a thick blanket)      Moderate chills at times mostly in afternoons 4. OTHER SYMPTOMS: "Do you have any other symptoms besides the fever?"  (e.g., abdomen pain, cough, diarrhea, earache, headache,  sore throat, urination pain)     Cough productive thick white sputum 5. CAUSE: If there are no symptoms, ask: "What do you think is causing the fever?"      Na  6. CONTACTS: "Does anyone else in the family have an infection?"     no 7. TREATMENT: "What have you done so far to treat this fever?" (e.g., medications)     Has seen PCP prednisone given  8. IMMUNOCOMPROMISE: "Do you have of the following: diabetes, HIV positive, splenectomy, cancer chemotherapy, chronic steroid treatment, transplant patient, etc."     Hx asthma  9. PREGNANCY: "Is there any chance you are pregnant?" "When was your last menstrual period?"     na 10. TRAVEL: "Have you traveled out of the country in the last month?" (e.g., travel history, exposures)       na  Protocols used: [facility]

## 2022-02-13 NOTE — Addendum Note (Signed)
Addended by: Jearld Fenton on: 02/13/2022 10:01 AM   Modules accepted: Orders

## 2022-02-15 ENCOUNTER — Ambulatory Visit: Payer: Self-pay | Admitting: *Deleted

## 2022-02-15 NOTE — Telephone Encounter (Signed)
  Chief Complaint: Covid positive this morning Symptoms: Fever, sore throat, coughing, headache, runny nose.      (I've been sick for a long time with URI symptoms but this morning I felt worse and decided to test)  I've had a fever for a long time and they can't figure out where it's coming from. Frequency: Felt worse this morning.   Pertinent Negatives: Patient denies shortness of breath or chest tightness though she is using her nebulizer and rescue inhaler as needed.   (Has asthma) Disposition: '[]'$ ED /'[]'$ Urgent Care (no appt availability in office) / '[]'$ Appointment(In office/virtual)/ '[]'$  [place] [facility]/ '[]'$ Home Care/ '[]'$ Refused Recommended Disposition /'[]'$ [place] [facility]/ '[x]'$  Follow-up with PCP Additional Notes: A high priority message has been sent to [place] to someone calling her back.    Interested in taking an antiviral for Covid.

## 2022-02-15 NOTE — Telephone Encounter (Signed)
Message from Erick Blinks sent at 02/15/2022  3:06 PM EDT  Summary: Covid positive + symptoms   Pt called to report that she has tested positive for covid, fever /sore throat / cough/ headache / runny nose   Best contact: [phone]           Call History   Type Contact Phone/Fax User  02/15/2022 03:04 PM EDT Phone ([address]) [place], [place] (Self) [phone] Jerilynn Mages) Marlan Palau    Reason for Disposition  [1] HIGH RISK patient (e.g., weak immune system, age > 32 years, obesity with BMI 30 or higher, pregnant, chronic lung disease or other chronic medical condition) AND [2] COVID symptoms (e.g., cough, fever)  (Exceptions: Already seen by PCP and no new or worsening symptoms.)  Answer Assessment - Initial Assessment Questions 1. COVID-19 DIAGNOSIS: "How do you know that you have COVID?" (e.g., positive lab test or self-test, diagnosed by doctor or NP/PA, symptoms after exposure).     Covid positive       I've been sick for a long time.   But today I checked and I'm positive.    2. COVID-19 EXPOSURE: "Was there any known exposure to COVID before the symptoms began?" CDC Definition of close contact: within 6 feet (2 meters) for a total of 15 minutes or more over a 24-hour period.      Not asked 3. ONSET: "When did the COVID-19 symptoms start?"      I started feeling worse this morning.   So I tested. I've had a fever for a month.   It's worse in the afternoon.   This morning it was 100.8 4. WORST SYMPTOM: "What is your worst symptom?" (e.g., cough, fever, shortness of breath, muscle aches)     fever 5. COUGH: "Do you have a cough?" If Yes, ask: "How bad is the cough?"       Yes  Thick white but now it's more yellow color. 6. FEVER: "Do you have a fever?" If Yes, ask: "What is your temperature, how was it measured, and when did it start?"     Yes In evening it's at it's highest.    I'm on Amoxycillin now.  I've had fever for a month.  7. RESPIRATORY STATUS: "Describe your  breathing?" (e.g., normal; shortness of breath, wheezing, unable to speak)      Yes the other night I took a shower and got really got short of breath.    I have asthma.   I've been doing better since.   I'm using my nebulizer. 8. BETTER-SAME-WORSE: "Are you getting better, staying the same or getting worse compared to yesterday?"  If getting worse, ask, "In what way?"     Not asked 9. OTHER SYMPTOMS: "Do you have any other symptoms?"  (e.g., chills, fatigue, headache, loss of smell or taste, muscle pain, sore throat)     Tired, headache, sore throat, runny nose 10. HIGH RISK DISEASE: "Do you have any chronic medical problems?" (e.g., asthma, heart or lung disease, weak immune system, obesity, etc.)       I've been sick for a month with a fever.    11. VACCINE: "Have you had the COVID-19 vaccine?" If Yes, ask: "Which one, how many shots, when did you get it?"       Not asked 12. PREGNANCY: "Is there any chance you are pregnant?" "When was your last menstrual period?"       Not asked 13. O2 SATURATION MONITOR:  "Do you use an  oxygen saturation monitor (pulse oximeter) at home?" If Yes, ask "What is your reading (oxygen level) today?" "What is your usual oxygen saturation reading?" (e.g., 95%)       N/A  Protocols used: Coronavirus (COVID-19) Diagnosed or Suspected-A-AH

## 2022-02-18 ENCOUNTER — Encounter: Payer: Self-pay | Admitting: Internal Medicine

## 2022-02-18 ENCOUNTER — Telehealth (INDEPENDENT_AMBULATORY_CARE_PROVIDER_SITE_OTHER): Payer: Medicare HMO | Admitting: Internal Medicine

## 2022-02-18 DIAGNOSIS — U071 COVID-19: Secondary | ICD-10-CM | POA: Diagnosis not present

## 2022-02-18 MED ORDER — HYDROCOD POLI-CHLORPHE POLI ER 10-8 MG/5ML PO SUER: 5.0000 mL | Freq: Two times a day (BID) | ORAL | 0 refills | Status: DC | PRN

## 2022-02-18 NOTE — Progress Notes (Signed)
Virtual Visit via Video Note  I connected with Michele Newton on 02/18/22 at 11:20 AM EDT by a video enabled telemedicine application and verified that I am speaking with the correct person using two identifiers.  Location: Patient: Home Provider: Office  Person's participating in this video call: [name] NP-C and Dayelin Balducci.   I discussed the limitations of evaluation and management by telemedicine and the availability of in person appointments. The patient expressed understanding and agreed to proceed.  History of Present Illness:  Pt reports headache, runny nose, ear fullness, sore throat, and cough. This started 4 days ago.  The headache is located in her forehead.  She describes the pain as pressure.  She is blowing clear mucous out of her nose.  She did has difficulty swallowing.  The cough is productive of thick yellow mucous. The cough is keeping her up at night.  She has had fevers but denies chills or body aches.  She took a home covid test which was positive. She is currently on an antibiotic for persistent fevers and URI symptoms. She is taking Mucinex, Aleve OTC and nembulizer with some relief of symptoms.    Past Medical History:  Diagnosis Date   Anxiety    Arthritis    Asthma    Depression    Frequent headaches    GERD (gastroesophageal reflux disease)    Hydrocephalus (HCC) 1995   Hypertension    Migraine    PONV (postoperative nausea and vomiting)    Psoriatic arthritis (HCC)     Current Outpatient Medications  Medication Sig Dispense Refill   amoxicillin-clavulanate (AUGMENTIN) 875-125 MG tablet Take 1 tablet by mouth 2 (two) times daily. 20 tablet 0   docusate sodium (COLACE) 100 MG capsule Take 1 capsule (100 mg total) by mouth 2 (two) times daily. 10 capsule 0   fluticasone (FLONASE) 50 MCG/ACT nasal spray Place 2 sprays into both nostrils daily. Use for 4-6 weeks then stop and use seasonally or as needed. 16 g 3   methocarbamol (ROBAXIN) 500 MG tablet  Take 1 tablet by mouth every 6 (six) hours as needed.     montelukast (SINGULAIR) 10 MG tablet TAKE 1 TABLET(10 MG) BY MOUTH AT BEDTIME 90 tablet 0   pantoprazole (PROTONIX) 40 MG tablet TAKE 1 TABLET(40 MG) BY MOUTH TWICE DAILY 180 tablet 3   PARoxetine (PAXIL) 40 MG tablet TAKE 1 TABLET(40 MG) BY MOUTH AT BEDTIME 90 tablet 0   QUEtiapine (SEROQUEL) 200 MG tablet TAKE 1 TABLET(200 MG) BY MOUTH AT BEDTIME 90 tablet 0   traMADol (ULTRAM) 50 MG tablet Take 1 tablet (50 mg total) by mouth every 6 (six) hours as needed. 30 tablet 0   traZODone (DESYREL) 50 MG tablet TAKE 1/2 TO 1 TABLET(25 TO 50 MG) BY MOUTH AT BEDTIME AS NEEDED FOR SLEEP 30 tablet 2   verapamil (CALAN-SR) 180 MG CR tablet TAKE 1 TABLET(180 MG) BY MOUTH AT BEDTIME 90 tablet 0   albuterol (VENTOLIN HFA) 108 (90 Base) MCG/ACT inhaler Inhale 1-2 puffs into the lungs every 6 (six) hours as needed for wheezing or shortness of breath. (Patient not taking: Reported on 02/18/2022) 18 g 1   budesonide-formoterol (SYMBICORT) 160-4.5 MCG/ACT inhaler Inhale 2 puffs into the lungs 2 (two) times daily. (Patient not taking: Reported on 01/31/2022) 1 each 3   predniSONE (DELTASONE) 10 MG tablet Take 1 tablet (10 mg total) by mouth daily with breakfast. 5 tablet 0   No current facility-administered medications for this visit.  Allergies  Allergen Reactions   Amitriptyline Other (See Comments)    hallucinations   Effexor [Venlafaxine] Other (See Comments)    Amnesia, hospitalized x 2 weeks   Haldol [Haloperidol] Other (See Comments)    Paralyzed vocal cords     Family History  Problem Relation Age of Onset   Arthritis Mother    Hyperlipidemia Mother    Heart disease Mother 61       Died of heart failure   Hypertension Mother    Diabetes Mother    Depression Mother    Arthritis Father    Cancer Father        prostate cancer   Diabetes Brother    Breast cancer Maternal Grandmother    Colon cancer Neg Hx     Social History    Socioeconomic History   Marital status: Married    Spouse name: [name]   Number of children: 2   Years of education: 14   Highest education level: Not on file  Occupational History    Employer: OTHER  Tobacco Use   Smoking status: Never   Smokeless tobacco: Never  Vaping Use   Vaping Use: Never used  Substance and Sexual Activity   Alcohol use: No   Drug use: No   Sexual activity: Not Currently    Birth control/protection: Surgical  Other Topics Concern   Not on file  Social History Narrative   [name] lives at home with her husband, [name], of 5 months and a cat named, Puff. She has 2 sons from a previous marriage. She has 4 grandchildren.    Social Determinants of Health   Financial Resource Strain: Low Risk  (09/14/2021)   Overall Financial Resource Strain (CARDIA)    Difficulty of Paying Living Expenses: Not hard at all  Food Insecurity: No Food Insecurity (09/14/2021)   Hunger Vital Sign    Worried About Running Out of Food in the Last Year: Never true    Ran Out of Food in the Last Year: Never true  Transportation Needs: No Transportation Needs (09/14/2021)   PRAPARE - Hydrologist (Medical): No    Lack of Transportation (Non-Medical): No  Physical Activity: Insufficiently Active (09/14/2021)   Exercise Vital Sign    Days of Exercise per Week: 2 days    Minutes of Exercise per Session: 20 min  Stress: No Stress Concern Present (09/14/2021)   [place]    Feeling of Stress : Only a little  Social Connections: Moderately Isolated (09/14/2021)   Social Connection and Isolation Panel [NHANES]    Frequency of Communication with Friends and Family: Three times a week    Frequency of Social Gatherings with Friends and Family: Once a week    Attends Religious Services: Never    Marine scientist or Organizations: No    Attends Archivist Meetings: Never    Marital  Status: Married  Human resources officer Violence: Not At Risk (09/14/2021)   Humiliation, Afraid, Rape, and Kick questionnaire    Fear of Current or Ex-Partner: No    Emotionally Abused: No    Physically Abused: No    Sexually Abused: No     Constitutional: Patient reports fever, headache.  Denies malaise, fatigue, or abrupt weight changes.  HEENT: Patient reports runny nose, sore throat.  Denies eye pain, eye redness, ear pain, ringing in the ears, wax buildup, nasal congestion, bloody nose. Respiratory: Patient reports cough.  Denies difficulty breathing, shortness of breath.   Cardiovascular: Denies chest pain, chest tightness, palpitations or swelling in the hands or feet.  Gastrointestinal: Denies abdominal pain, bloating, constipation, diarrhea or blood in the stool.   No other specific complaints in a complete review of systems (except as listed in HPI above).    Observations/Objective:  Wt Readings from Last 3 Encounters:  01/31/22 208 lb (94.3 kg)  01/09/22 204 lb (92.5 kg)  12/25/21 205 lb (93 kg)    General: Appears her stated age, obese, appears unwell but in NAD. Skin: Warm, dry and intact.  HEENT: Head: normal shape and size; Nose: Sniffling noted; Throat/Mouth: Hoarseness noted.  Pulmonary/Chest: Normal effort.  Dry cough noted.  No respiratory distress.   Neurological: Alert and oriented.   BMET    Component Value Date/Time   NA 140 01/31/2022 0859   K 4.9 01/31/2022 0859   CL 105 01/31/2022 0859   CO2 26 01/31/2022 0859   GLUCOSE 93 01/31/2022 0859   BUN 13 01/31/2022 0859   CREATININE 0.84 01/31/2022 0859   CALCIUM 9.5 01/31/2022 0859   GFRNONAA >60 12/26/2021 0622   GFRNONAA 69 10/09/2017 1024   GFRAA >60 11/20/2018 0408   GFRAA 79 10/09/2017 1024    Lipid Panel     Component Value Date/Time   CHOL 268 (H) 10/29/2021 0947   TRIG 212 (H) 10/29/2021 0947   HDL 47 (L) 10/29/2021 0947   CHOLHDL 5.7 (H) 10/29/2021 0947   VLDL 33.0 02/20/2017 0841    LDLCALC 181 (H) 10/29/2021 0947    CBC    Component Value Date/Time   WBC 8.3 01/31/2022 0859   RBC 4.79 01/31/2022 0859   HGB 13.7 01/31/2022 0859   HCT 41.6 01/31/2022 0859   PLT 400 01/31/2022 0859   MCV 86.8 01/31/2022 0859   MCH 28.6 01/31/2022 0859   MCHC 32.9 01/31/2022 0859   RDW 13.0 01/31/2022 0859   LYMPHSABS 2.2 07/18/2020 1054   MONOABS 0.8 07/18/2020 1054   EOSABS 0.1 07/18/2020 1054   BASOSABS 0.1 07/18/2020 1054    Hgb A1C Lab Results  Component Value Date   HGBA1C 5.5 10/29/2021        Assessment and Plan:  COVID-19:  Encourage rest and fluids Discussed the possibility of using Paxlovid, however her symptoms are improving so we will plan to hold off on this to prevent rebound symptoms Start Zyrtec and Flonase OTC Rx for Tussionex for cough-sedation caution given Return precautions discussed  Follow Up Instructions:    I discussed the assessment and treatment plan with the patient. The patient was provided an opportunity to ask questions and all were answered. The patient agreed with the plan and demonstrated an understanding of the instructions.   The patient was advised to call back or seek an in-person evaluation if the symptoms worsen or if the condition fails to improve as anticipated.   [name], NP

## 2022-02-18 NOTE — Telephone Encounter (Signed)
Virtual visit scheduled for 02/18/2022 at 11:20am   Thanks,   -Mickel Baas

## 2022-02-18 NOTE — Patient Instructions (Signed)

## 2022-03-07 ENCOUNTER — Other Ambulatory Visit
Admission: RE | Admit: 2022-03-07 | Discharge: 2022-03-07 | Disposition: A | Discharge status: Home / Self Care | Payer: Medicare HMO | Source: Ambulatory Visit | Attending: Student | Admitting: Student

## 2022-03-07 ENCOUNTER — Encounter: Payer: Self-pay | Admitting: *Deleted

## 2022-03-07 ENCOUNTER — Other Ambulatory Visit: Payer: Self-pay

## 2022-03-07 ENCOUNTER — Emergency Department: Payer: Medicare HMO

## 2022-03-07 DIAGNOSIS — I10 Essential (primary) hypertension: Secondary | ICD-10-CM | POA: Insufficient documentation

## 2022-03-07 DIAGNOSIS — B9689 Other specified bacterial agents as the cause of diseases classified elsewhere: Secondary | ICD-10-CM | POA: Diagnosis not present

## 2022-03-07 DIAGNOSIS — R0602 Shortness of breath: Secondary | ICD-10-CM | POA: Insufficient documentation

## 2022-03-07 DIAGNOSIS — Z96643 Presence of artificial hip joint, bilateral: Secondary | ICD-10-CM | POA: Insufficient documentation

## 2022-03-07 DIAGNOSIS — J45909 Unspecified asthma, uncomplicated: Secondary | ICD-10-CM | POA: Insufficient documentation

## 2022-03-07 DIAGNOSIS — Z03818 Encounter for observation for suspected exposure to other biological agents ruled out: Secondary | ICD-10-CM | POA: Diagnosis not present

## 2022-03-07 DIAGNOSIS — Z79899 Other long term (current) drug therapy: Secondary | ICD-10-CM | POA: Diagnosis not present

## 2022-03-07 DIAGNOSIS — J40 Bronchitis, not specified as acute or chronic: Secondary | ICD-10-CM | POA: Diagnosis not present

## 2022-03-07 DIAGNOSIS — R053 Chronic cough: Secondary | ICD-10-CM | POA: Diagnosis not present

## 2022-03-07 DIAGNOSIS — R509 Fever, unspecified: Secondary | ICD-10-CM | POA: Diagnosis not present

## 2022-03-07 DIAGNOSIS — J90 Pleural effusion, not elsewhere classified: Secondary | ICD-10-CM | POA: Diagnosis not present

## 2022-03-07 DIAGNOSIS — Z7951 Long term (current) use of inhaled steroids: Secondary | ICD-10-CM | POA: Diagnosis not present

## 2022-03-07 DIAGNOSIS — R059 Cough, unspecified: Secondary | ICD-10-CM | POA: Diagnosis not present

## 2022-03-07 DIAGNOSIS — K449 Diaphragmatic hernia without obstruction or gangrene: Secondary | ICD-10-CM | POA: Diagnosis not present

## 2022-03-07 DIAGNOSIS — Z8616 Personal history of COVID-19: Secondary | ICD-10-CM | POA: Insufficient documentation

## 2022-03-07 DIAGNOSIS — R5383 Other fatigue: Secondary | ICD-10-CM | POA: Diagnosis not present

## 2022-03-07 DIAGNOSIS — J019 Acute sinusitis, unspecified: Secondary | ICD-10-CM | POA: Diagnosis not present

## 2022-03-07 LAB — COMPREHENSIVE METABOLIC PANEL
ALT: 28 U/L (ref 0–44)
AST: 31 U/L (ref 15–41)
Albumin: 4.1 g/dL (ref 3.5–5.0)
Alkaline Phosphatase: 103 U/L (ref 38–126)
Anion gap: 11 (ref 5–15)
BUN: 12 mg/dL (ref 8–23)
CO2: 23 mmol/L (ref 22–32)
Calcium: 8.9 mg/dL (ref 8.9–10.3)
Chloride: 104 mmol/L (ref 98–111)
Creatinine, Ser: 1.01 mg/dL — ABNORMAL HIGH (ref 0.44–1.00)
GFR, Estimated: >60 mL/min (ref >60)
Glucose, Bld: 167 mg/dL — ABNORMAL HIGH (ref 70–99)
Potassium: 4 mmol/L (ref 3.5–5.1)
Sodium: 138 mmol/L (ref 135–145)
Total Bilirubin: 0.6 mg/dL (ref 0.3–1.2)
Total Protein: 7.4 g/dL (ref 6.5–8.1)

## 2022-03-07 LAB — TROPONIN I (HIGH SENSITIVITY): Troponin I (High Sensitivity): 4 ng/L (ref <18)

## 2022-03-07 LAB — BRAIN NATRIURETIC PEPTIDE: B Natriuretic Peptide: 14.4 pg/mL (ref 0.0–100.0)

## 2022-03-07 LAB — D-DIMER, QUANTITATIVE: D-Dimer, Quant: 0.92 ug/mL-FEU — ABNORMAL HIGH (ref 0.00–0.50)

## 2022-03-07 MED ORDER — IOHEXOL 350 MG/ML SOLN
80.0000 mL | Freq: Once | INTRAVENOUS | Status: AC | PRN
  Administered 2022-03-07: 80 mL via INTRAVENOUS

## 2022-03-07 NOTE — ED Triage Notes (Signed)
Pt reports sob for 1 month.  Pt has [facility]dimer at  [facility]   no chest pain.  Intermittent fever.  pt sent to er for eval.  Pt alert  speech clear.

## 2022-03-08 ENCOUNTER — Emergency Department
Admission: EM | Admit: 2022-03-08 | Discharge: 2022-03-08 | Disposition: A | Discharge status: Home / Self Care | Payer: Medicare HMO | Attending: Emergency Medicine | Admitting: Emergency Medicine

## 2022-03-08 DIAGNOSIS — J45909 Unspecified asthma, uncomplicated: Secondary | ICD-10-CM

## 2022-03-08 DIAGNOSIS — R0602 Shortness of breath: Secondary | ICD-10-CM

## 2022-03-08 DIAGNOSIS — R051 Acute cough: Secondary | ICD-10-CM

## 2022-03-08 LAB — TROPONIN I (HIGH SENSITIVITY): Troponin I (High Sensitivity): 3 ng/L (ref <18)

## 2022-03-08 MED ORDER — PREDNISONE 20 MG PO TABS
60.0000 mg | ORAL_TABLET | Freq: Once | ORAL | Status: AC
  Administered 2022-03-08: 60 mg via ORAL
  Filled 2022-03-08: qty 3

## 2022-03-08 MED ORDER — HYDROCOD POLI-CHLORPHE POLI ER 10-8 MG/5ML PO SUER
5.0000 mL | Freq: Once | ORAL | Status: AC
  Administered 2022-03-08: 5 mL via ORAL
  Filled 2022-03-08: qty 5

## 2022-03-08 MED ORDER — IPRATROPIUM-ALBUTEROL 0.5-2.5 (3) MG/3ML IN SOLN
3.0000 mL | Freq: Once | RESPIRATORY_TRACT | Status: AC
  Administered 2022-03-08: 3 mL via RESPIRATORY_TRACT
  Filled 2022-03-08: qty 3

## 2022-03-08 MED ORDER — ACETAMINOPHEN 325 MG PO TABS
650.0000 mg | ORAL_TABLET | Freq: Once | ORAL | Status: AC
  Administered 2022-03-08: 650 mg via ORAL
  Filled 2022-03-08: qty 2

## 2022-03-08 MED ORDER — COMPRESSOR/NEBULIZER MISC: 1.0000 [IU] | 0 refills | Status: DC | PRN

## 2022-03-08 MED ORDER — ALBUTEROL SULFATE (2.5 MG/3ML) 0.083% IN NEBU: 2.5000 mg | INHALATION_SOLUTION | RESPIRATORY_TRACT | 0 refills | Status: DC | PRN

## 2022-03-08 NOTE — ED Notes (Signed)
Pt brought to ED rm 19 at this time. This RN now assuming care.

## 2022-03-08 NOTE — Discharge Instructions (Addendum)
You may use Albuterol nebulizer every 4 hours as needed for cough/wheezing/difficulty breathing.  Continue all the other medications you were prescribed earlier.  Return to the ER for worsening symptoms, persistent vomiting, difficulty breathing or other concerns.

## 2022-03-08 NOTE — ED Notes (Signed)
Signing pad did not work. Pt verbalized understanding of DC instructions.

## 2022-03-08 NOTE — ED Notes (Signed)
ED Provider at bedside. 

## 2022-03-08 NOTE — ED Provider Notes (Signed)
[facility] Provider Note    Event Date/Time   First MD Initiated Contact with Patient 03/08/22 0028     (approximate)   History   Shortness of Breath   HPI  Michele Newton is a 66 y.o. female referred to the ED [facility] for shortness of breath x1 month with an elevated D-dimer.  Patient has a history of asthma and has been running intermittent fevers and cough since hip surgery back in August.  Patient was COVID-positive in mid October.  States she has been on multiple cycles of antibiotics and prednisone.  Seen at [facility] yesterday with positive D-dimer and sent to the ED for CTA chest to rule out PE.  She was also placed on antibiotic, prednisone burst, Tussionex and an inhaler.  Husband states she has been using his albuterol nebulizer with relief of symptoms.  Patient denies fever, chest pain, abdominal pain, nausea, vomiting or dizziness.     Past Medical History   Past Medical History:  Diagnosis Date   Anxiety    Arthritis    Asthma    Depression    Frequent headaches    GERD (gastroesophageal reflux disease)    Hydrocephalus ([place]) 1995   Hypertension    Migraine    PONV (postoperative nausea and vomiting)    Psoriatic arthritis ([place])      Active Problem List   Patient Active Problem List   Diagnosis Date Noted   Mixed hyperlipidemia 11/05/2021   Moderate episode of recurrent major depressive disorder ([place]) 10/25/2020   IBS (irritable bowel syndrome) 06/20/2020   Class 1 obesity due to excess calories with body mass index (BMI) of 32.0 to 32.9 in adult 03/13/2020   Psychophysiological insomnia 02/29/2020   Localized osteoarthrosis of left hip 08/26/2018   Essential hypertension 02/06/2018   Asthma 02/06/2018   Chronic midline low back pain without sciatica 04/08/2017   Chronic bilateral low back pain with left-sided sciatica 04/08/2017   GERD (gastroesophageal reflux disease) 07/09/2014   Psoriatic arthritis ([place]) 07/27/2013    Anxiety 06/11/2013     Past Surgical History   Past Surgical History:  Procedure Laterality Date   BREAST SURGERY  2000   biopsy   NASAL SINUS SURGERY  1998   TENDON REPAIR Left    hand   TONSILLECTOMY AND ADENOIDECTOMY  1985   TOTAL HIP ARTHROPLASTY Left 11/19/2018   Procedure: TOTAL HIP ARTHROPLASTY ANTERIOR APPROACH;  Surgeon: Hessie Knows, MD;  Location: [facility] ORS;  Service: Orthopedics;  Laterality: Left;   TOTAL HIP ARTHROPLASTY Right 07/27/2020   Procedure: TOTAL HIP ARTHROPLASTY ANTERIOR APPROACH;  Surgeon: Hessie Knows, MD;  Location: [facility] ORS;  Service: Orthopedics;  Laterality: Right;   TOTAL HIP REVISION Right 12/25/2021   Procedure: Right hip revision, acetabular component & femoral head;  Surgeon: Hessie Knows, MD;  Location: [facility] ORS;  Service: Orthopedics;  Laterality: Right;   TUBAL LIGATION     TYMPANOSTOMY [place]   VENTRICULAR ATRIAL SHUNT Right 1995     Home Medications   Prior to Admission medications   Medication Sig Start Date End Date Taking? Authorizing Provider  albuterol (VENTOLIN HFA) 108 (90 Base) MCG/ACT inhaler Inhale 1-2 puffs into the lungs every 6 (six) hours as needed for wheezing or shortness of breath. Patient not taking: Reported on 02/18/2022 10/29/21   Jearld Fenton, NP  budesonide-formoterol [facility]) 160-4.5 MCG/ACT inhaler Inhale 2 puffs into the lungs 2 (two) times daily. Patient not taking: Reported on 01/31/2022  10/29/21   Jearld Fenton, NP  chlorpheniramine-HYDROcodone (TUSSIONEX) 10-8 MG/5ML Take 5 mLs by mouth every 12 (twelve) hours as needed for cough. 02/18/22   Jearld Fenton, NP  docusate sodium (COLACE) 100 MG capsule Take 1 capsule (100 mg total) by mouth 2 (two) times daily. 12/26/21   Duanne Guess, PA-C  fluticasone (FLONASE) 50 MCG/ACT nasal spray Place 2 sprays into both nostrils daily. Use for 4-6 weeks then stop and use seasonally or as needed. 03/01/19   Karamalegos, Devonne Doughty, DO   methocarbamol (ROBAXIN) 500 MG tablet Take 1 tablet by mouth every 6 (six) hours as needed. 12/26/21   [provider]  montelukast (SINGULAIR) 10 MG tablet TAKE 1 TABLET(10 MG) BY MOUTH AT BEDTIME 01/23/22   Jearld Fenton, NP  pantoprazole (PROTONIX) 40 MG tablet TAKE 1 TABLET(40 MG) BY MOUTH TWICE DAILY 10/29/21   Jearld Fenton, NP  PARoxetine (PAXIL) 40 MG tablet TAKE 1 TABLET(40 MG) BY MOUTH AT BEDTIME 12/19/21   Jearld Fenton, NP  QUEtiapine (SEROQUEL) 200 MG tablet TAKE 1 TABLET(200 MG) BY MOUTH AT BEDTIME 12/19/21   Jearld Fenton, NP  traMADol (ULTRAM) 50 MG tablet Take 1 tablet (50 mg total) by mouth every 6 (six) hours as needed. 12/26/21   Duanne Guess, PA-C  traZODone (DESYREL) 50 MG tablet TAKE 1/2 TO 1 TABLET(25 TO 50 MG) BY MOUTH AT BEDTIME AS NEEDED FOR SLEEP 07/09/21   Jearld Fenton, NP  verapamil (CALAN-SR) 180 MG CR tablet TAKE 1 TABLET(180 MG) BY MOUTH AT BEDTIME 12/19/21   Jearld Fenton, NP     Allergies  Amitriptyline, Effexor [venlafaxine], and Haldol [haloperidol]   Family History   Family History  Problem Relation Age of Onset   Arthritis Mother    Hyperlipidemia Mother    Heart disease Mother 72       Died of heart failure   Hypertension Mother    Diabetes Mother    Depression Mother    Arthritis Father    Cancer Father        prostate cancer   Diabetes Brother    Breast cancer Maternal Grandmother    Colon cancer Neg Hx      Physical Exam  Triage Vital Signs: ED Triage Vitals  Enc Vitals Group     BP 03/07/22 1852 (!) 160/93     Pulse Rate 03/07/22 1852 98     Resp 03/07/22 1852 20     Temp 03/07/22 1852 98.2 F (36.8 C)     Temp Source 03/07/22 1852 Oral     SpO2 03/07/22 1852 97 %     Weight 03/07/22 1850 207 lb 3.7 oz (94 kg)     Height 03/07/22 1850 '5\' 6"'$  (1.676 m)     Head Circumference --      Peak Flow --      Pain Score 03/07/22 1849 4     Pain Loc --      Pain Edu? --      Excl. in [place]? --     Updated Vital  Signs: BP (!) 150/91   Pulse 79   Temp 97.6 F (36.4 C) (Oral)   Resp 13   Ht '5\' 6"'$  (1.676 m)   Wt 94 kg   SpO2 96%   BMI 33.45 kg/m    General: Awake, no distress.  CV:  RRR.  Good peripheral perfusion.  Resp:  Normal effort.  Diminished, otherwise CTA B.  No wheezing. Abd:  Nontender.  No distention.  Other:  Bilateral calves are nontender and nonswollen.   ED Results / Procedures / Treatments  Labs (all labs ordered are listed, but only abnormal results are displayed) Labs Reviewed  COMPREHENSIVE METABOLIC PANEL - Abnormal; Notable for the following components:      Result Value   Glucose, Bld 167 (*)    Creatinine, Ser 1.01 (*)    All other components within normal limits  BRAIN NATRIURETIC PEPTIDE  TROPONIN I (HIGH SENSITIVITY)  TROPONIN I (HIGH SENSITIVITY)  TROPONIN I (HIGH SENSITIVITY)     EKG  None   RADIOLOGY I have independently visualized and interpreted patient's CTA chest as well as noted the radiology interpretation:  CTA chest: No PE, hiatal hernia, trace right pleural effusion  Official radiology report(s): CT Angio Chest PE W and/or Wo Contrast  Result Date: 03/07/2022 CLINICAL DATA:  Shortness of breath x1 month, elevated D-dimer EXAM: CT ANGIOGRAPHY CHEST WITH CONTRAST TECHNIQUE: Multidetector CT imaging of the chest was performed using the standard protocol during bolus administration of intravenous contrast. Multiplanar CT image reconstructions and MIPs were obtained to evaluate the vascular anatomy. RADIATION DOSE REDUCTION: This exam was performed according to the departmental dose-optimization program which includes automated exposure control, adjustment of the mA and/or kV according to patient size and/or use of iterative reconstruction technique. CONTRAST:  33m OMNIPAQUE IOHEXOL 350 MG/ML SOLN COMPARISON:  Chest radiograph done on 01/31/2022 FINDINGS: Cardiovascular: There is homogeneous enhancement in thoracic aorta. There are no  intraluminal filling defects in pulmonary artery branches. Scattered coronary artery calcifications are seen. Minimal pericardial effusion is seen. Mediastinum/Nodes: Unremarkable. Lungs/Pleura: There is no focal pulmonary consolidation. Minimal right pleural effusion is seen. There is no pneumothorax. Upper Abdomen: There is a thin metallic structure anterior to the liver extending into subcutaneous plane and right chest wall and into the neck. There is a small hiatal hernia. Musculoskeletal: No acute findings are seen. Review of the MIP images confirms the above findings. IMPRESSION: There is no evidence of pulmonary artery embolism. There is no evidence of thoracic aortic dissection. Scattered coronary artery calcifications are seen. There is no focal pulmonary consolidation. Small fixed hiatal hernia is seen. Electronically Signed   By: PElmer PickerM.D.   On: 03/07/2022 19:51     PROCEDURES:  Critical Care performed: No  Procedures   MEDICATIONS ORDERED IN ED: Medications  iohexol (OMNIPAQUE) 350 MG/ML injection 80 mL (80 mLs Intravenous Contrast Given 03/07/22 1932)  ipratropium-albuterol (DUONEB) 0.5-2.5 (3) MG/3ML nebulizer solution 3 mL (3 mLs Nebulization Given 03/08/22 0107)  predniSONE (DELTASONE) tablet 60 mg (60 mg Oral Given 03/08/22 0106)  chlorpheniramine-HYDROcodone (TUSSIONEX) 10-8 MG/5ML suspension 5 mL (5 mLs Oral Given 03/08/22 0107)     IMPRESSION / MDM / ASSESSMENT AND PLAN / ED COURSE  I reviewed the triage vital signs and the nursing notes.                             66year old female presenting with shortness of breath x1 month with elevated D-dimer; COVID-positive 2 weeks ago. Differential includes, but is not limited to, viral syndrome, bronchitis including COPD exacerbation, pneumonia, reactive airway disease including asthma, CHF including exacerbation with or without pulmonary/interstitial edema, pneumothorax, ACS, thoracic trauma, and pulmonary embolism.   I have personally reviewed patient's records and note her office visit from yesterday.  Patient's presentation is most consistent with acute presentation with potential threat to  life or bodily function.  CTA chest is negative for PE.  Patient has not had time to pick up any of her prescriptions from yesterday.  Will administer DuoNeb, prednisone, Tussionex and reassess.  Clinical Course as of 03/08/22 0145  Fri Mar 08, 2022  0144 Aeration improved, patient feeling better.  Will discharge home with prescription for albuterol nebulizer and solution.  Patient has all of her other prescriptions from [facility].  Strict return precautions given.  Patient and spouse verbalized understanding and agree with plan of care. [JS]    Clinical Course User Index [JS] Paulette Blanch, MD     FINAL CLINICAL IMPRESSION(S) / ED DIAGNOSES   Final diagnoses:  Shortness of breath  Acute cough  Moderate asthma, unspecified whether complicated, unspecified whether persistent     Rx / DC Orders   ED Discharge Orders     None        Note:  This document was prepared using Dragon voice recognition software and may include unintentional dictation errors.   Paulette Blanch, MD 03/08/22 [id number]

## 2022-03-11 ENCOUNTER — Telehealth: Payer: Self-pay | Admitting: *Deleted

## 2022-03-11 NOTE — Telephone Encounter (Signed)
     Patient  visit on 03/08/2022  at [facility] was for Sob  Have you been able to follow up with your primary care physician? Patient is feeling better and she had been to the walkin clinic and was sent to the ed but it was a very long wait .  The patient was  able to obtain any needed medicine or equipment.  Are there diet recommendations that you are having difficulty following?  Patient expresses understanding of discharge instructions and education provided has no other needs at this time.   [facility]air [phone] [address] , [place] [address] Email : [email or web address]

## 2022-03-20 ENCOUNTER — Other Ambulatory Visit: Payer: Self-pay | Admitting: Internal Medicine

## 2022-03-20 DIAGNOSIS — F5104 Psychophysiologic insomnia: Secondary | ICD-10-CM

## 2022-03-20 NOTE — Telephone Encounter (Signed)
Requested Prescriptions  Pending Prescriptions Disp Refills   verapamil (CALAN-SR) 180 MG CR tablet [Pharmacy Med Name: VERAPAMIL ER '180MG'$  TABLETS] 90 tablet 0    Sig: TAKE 1 TABLET(180 MG) BY MOUTH AT BEDTIME     Cardiovascular: Calcium Channel Blockers 3 Failed - 03/20/2022  7:08 AM      Failed - Cr in normal range and within 360 days    Creat  Date Value Ref Range Status  01/31/2022 0.84 0.50 - 1.05 mg/dL Final   Creatinine, Ser  Date Value Ref Range Status  03/07/2022 1.01 (H) 0.44 - 1.00 mg/dL Final         Failed - Last BP in normal range    BP Readings from Last 1 Encounters:  03/08/22 (!) 151/86         Passed - ALT in normal range and within 360 days    ALT  Date Value Ref Range Status  03/07/2022 28 0 - 44 U/L Final         Passed - AST in normal range and within 360 days    AST  Date Value Ref Range Status  03/07/2022 31 15 - 41 U/L Final         Passed - Last Heart Rate in normal range    Pulse Readings from Last 1 Encounters:  03/08/22 84         Passed - Valid encounter within last 6 months    Recent Outpatient Visits           1 month ago [facility] [place], [name], NP   1 month ago Fever of unknown origin   [facility] [place], [place] W, NP   2 months ago History of revision of total replacement of right hip joint   [facility] [place], [name], NP   4 months ago Screening for colon cancer   [facility] [place], [name], NP   5 months ago Dysphagia, unspecified type   [facility] [place], [place] W, NP               traZODone (DESYREL) 50 MG tablet [Pharmacy Med Name: TRAZODONE '50MG'$  TABLETS] 90 tablet 0    Sig: TAKE 1/2 TO 1 TABLET(25 TO 50 MG) BY MOUTH AT BEDTIME AS NEEDED FOR SLEEP     Psychiatry: Antidepressants - Serotonin Modulator Passed - 03/20/2022  7:08 AM      Passed - Completed PHQ-2 or PHQ-9 in the last 360 days      Passed - Valid  encounter within last 6 months    Recent Outpatient Visits           1 month ago [facility] [place], [name], NP   1 month ago Fever of unknown origin   [facility] [place], [place], NP   2 months ago History of revision of total replacement of right hip joint   [facility] [place], [name], NP   4 months ago Screening for colon cancer   [facility] [place], [name], NP   5 months ago Dysphagia, unspecified type   [facility] [place], [name], NP

## 2022-04-08 ENCOUNTER — Encounter: Payer: Self-pay | Admitting: Internal Medicine

## 2022-04-08 ENCOUNTER — Ambulatory Visit (INDEPENDENT_AMBULATORY_CARE_PROVIDER_SITE_OTHER): Payer: Medicare HMO | Admitting: Internal Medicine

## 2022-04-08 ENCOUNTER — Other Ambulatory Visit: Payer: Self-pay | Admitting: Internal Medicine

## 2022-04-08 VITALS — BP 134/82 | HR 71 | Temp 96.8°F | Wt 205.0 lb

## 2022-04-08 DIAGNOSIS — Z20828 Contact with and (suspected) exposure to other viral communicable diseases: Secondary | ICD-10-CM | POA: Diagnosis not present

## 2022-04-08 DIAGNOSIS — J029 Acute pharyngitis, unspecified: Secondary | ICD-10-CM | POA: Diagnosis not present

## 2022-04-08 DIAGNOSIS — R0989 Other specified symptoms and signs involving the circulatory and respiratory systems: Secondary | ICD-10-CM

## 2022-04-08 LAB — POCT INFLUENZA A/B
Influenza A, POC: NEGATIVE
Influenza B, POC: NEGATIVE

## 2022-04-08 MED ORDER — OSELTAMIVIR PHOSPHATE 75 MG PO CAPS: 75.0000 mg | ORAL_CAPSULE | Freq: Every day | ORAL | 0 refills | Status: DC

## 2022-04-08 NOTE — Patient Instructions (Signed)

## 2022-04-08 NOTE — Progress Notes (Signed)
Subjective:    Patient ID: Michele Newton, female    DOB: 1955-09-01, 66 y.o.   MRN: [id number]  HPI  Patient presents to clinic today with complaint of runny nose and sore throat.  This started 2 days ago.  She is blowing clear mucus out of her nose.  She denies difficulty swallowing.  She denies headache, nasal congestion, ear pain, cough, chest congestion, shortness of breath, nausea, vomiting or diarrhea.  She denies fever, chills or body aches.  She has had sick contacts diagnosed with the flu.  She has not taken any medications OTC for the symptoms.  Review of Systems     Past Medical History:  Diagnosis Date   Anxiety    Arthritis    Asthma    Depression    Frequent headaches    GERD (gastroesophageal reflux disease)    Hydrocephalus ([facility]) 1995   Hypertension    Migraine    PONV (postoperative nausea and vomiting)    Psoriatic arthritis ([facility])     Current Outpatient Medications  Medication Sig Dispense Refill   albuterol (PROVENTIL) (2.5 MG/3ML) 0.083% nebulizer solution Take 3 mLs (2.5 mg total) by nebulization every 4 (four) hours as needed for wheezing or shortness of breath. 75 mL 0   chlorpheniramine-HYDROcodone (TUSSIONEX) 10-8 MG/5ML Take 5 mLs by mouth every 12 (twelve) hours as needed for cough. 75 mL 0   docusate sodium (COLACE) 100 MG capsule Take 1 capsule (100 mg total) by mouth 2 (two) times daily. 10 capsule 0   fluticasone (FLONASE) 50 MCG/ACT nasal spray Place 2 sprays into both nostrils daily. Use for 4-6 weeks then stop and use seasonally or as needed. 16 g 3   methocarbamol (ROBAXIN) 500 MG tablet Take 1 tablet by mouth every 6 (six) hours as needed.     montelukast (SINGULAIR) 10 MG tablet TAKE 1 TABLET(10 MG) BY MOUTH AT BEDTIME 90 tablet 0   Nebulizers (COMPRESSOR/NEBULIZER) MISC 1 Units by Does not apply route every 4 (four) hours as needed. 1 each 0   oseltamivir (TAMIFLU) 75 MG capsule Take 1 capsule (75 mg total) by mouth daily. For 10 days. If  develop symptoms change dosing to twice daily to finish course 10 capsule 0   pantoprazole (PROTONIX) 40 MG tablet TAKE 1 TABLET(40 MG) BY MOUTH TWICE DAILY 180 tablet 3   PARoxetine (PAXIL) 40 MG tablet TAKE 1 TABLET(40 MG) BY MOUTH AT BEDTIME 90 tablet 0   QUEtiapine (SEROQUEL) 200 MG tablet TAKE 1 TABLET(200 MG) BY MOUTH AT BEDTIME 90 tablet 0   traMADol (ULTRAM) 50 MG tablet Take 1 tablet (50 mg total) by mouth every 6 (six) hours as needed. 30 tablet 0   traZODone (DESYREL) 50 MG tablet TAKE 1/2 TO 1 TABLET(25 TO 50 MG) BY MOUTH AT BEDTIME AS NEEDED FOR SLEEP 90 tablet 0   verapamil (CALAN-SR) 180 MG CR tablet TAKE 1 TABLET(180 MG) BY MOUTH AT BEDTIME 90 tablet 0   budesonide-formoterol (SYMBICORT) 160-4.5 MCG/ACT inhaler Inhale 2 puffs into the lungs 2 (two) times daily. (Patient not taking: Reported on 01/31/2022) 1 each 3   No current facility-administered medications for this visit.    Allergies  Allergen Reactions   Amitriptyline Other (See Comments)    hallucinations   Effexor [Venlafaxine] Other (See Comments)    Amnesia, hospitalized x 2 weeks   Haldol [Haloperidol] Other (See Comments)    Paralyzed vocal cords     Family History  Problem Relation Age of Onset  Arthritis Mother    Hyperlipidemia Mother    Heart disease Mother 52       Died of heart failure   Hypertension Mother    Diabetes Mother    Depression Mother    Arthritis Father    Cancer Father        prostate cancer   Diabetes Brother    Breast cancer Maternal Grandmother    Colon cancer Neg Hx     Social History   Socioeconomic History   Marital status: Married    Spouse name: [name]   Number of children: 2   Years of education: 14   Highest education level: Not on file  Occupational History    Employer: OTHER  Tobacco Use   Smoking status: Never   Smokeless tobacco: Never  Vaping Use   Vaping Use: Never used  Substance and Sexual Activity   Alcohol use: No   Drug use: No   Sexual  activity: Not Currently    Birth control/protection: Surgical  Other Topics Concern   Not on file  Social History Narrative   Karyna lives at home with her husband, [name], of 5 months and a cat named, Puff. She has 2 sons from a previous marriage. She has 4 grandchildren.    Social Determinants of Health   Financial Resource Strain: Low Risk  (09/14/2021)   Overall Financial Resource Strain (CARDIA)    Difficulty of Paying Living Expenses: Not hard at all  Food Insecurity: No Food Insecurity (09/14/2021)   Hunger Vital Sign    Worried About Running Out of Food in the Last Year: Never true    Ran Out of Food in the Last Year: Never true  Transportation Needs: No Transportation Needs (09/14/2021)   PRAPARE - Hydrologist (Medical): No    Lack of Transportation (Non-Medical): No  Physical Activity: Insufficiently Active (09/14/2021)   Exercise Vital Sign    Days of Exercise per Week: 2 days    Minutes of Exercise per Session: 20 min  Stress: No Stress Concern Present (09/14/2021)   [place]    Feeling of Stress : Only a little  Social Connections: Moderately Isolated (09/14/2021)   Social Connection and Isolation Panel [NHANES]    Frequency of Communication with Friends and Family: Three times a week    Frequency of Social Gatherings with Friends and Family: Once a week    Attends Religious Services: Never    Marine scientist or Organizations: No    Attends Archivist Meetings: Never    Marital Status: Married  Human resources officer Violence: Not At Risk (09/14/2021)   Humiliation, Afraid, Rape, and Kick questionnaire    Fear of Current or Ex-Partner: No    Emotionally Abused: No    Physically Abused: No    Sexually Abused: No     Constitutional: Denies fever, malaise, fatigue, headache or abrupt weight changes.  HEENT: Patient reports runny nose and sore throat.  Denies eye  pain, eye redness, ear pain, ringing in the ears, wax buildup, nasal congestion, bloody nose. Respiratory: Denies difficulty breathing, shortness of breath, cough or sputum production.   Cardiovascular: Denies chest pain, chest tightness, palpitations or swelling in the hands or feet.  Gastrointestinal: Denies abdominal pain, bloating, constipation, diarrhea or blood in the stool.  Psych: Denies anxiety, depression, SI/HI.  No other specific complaints in a complete review of systems (except as listed in HPI  above).  Objective:   Physical Exam   BP 134/82 (BP Location: Right Arm, Patient Position: Sitting, Cuff Size: Normal)   Pulse 71   Temp (!) 96.8 F (36 C) (Temporal)   Wt 205 lb (93 kg)   SpO2 97%   BMI 33.09 kg/m  Wt Readings from Last 3 Encounters:  04/08/22 205 lb (93 kg)  03/07/22 207 lb 3.7 oz (94 kg)  01/31/22 208 lb (94.3 kg)    General: Appears her stated age, obese, in NAD. HEENT: Head: normal shape and size; Eyes: sclera white, no icterus, conjunctiva pink, PERRLA and EOMs intact;Nose: mucosa pink and moist, septum midline; Throat/Mouth: Teeth present, mucosa erythematous and moist,, + PND, no exudate, lesions or ulcerations noted.  Neck: No adenopathy noted. Cardiovascular: Normal rate and rhythm. S1,S2 noted.  No murmur, rubs or gallops noted.  Pulmonary/Chest: Normal effort and positive vesicular breath sounds. No respiratory distress. No wheezes, rales or ronchi noted.   Neurological: Alert and oriented.   BMET    Component Value Date/Time   NA 138 03/07/2022 1853   K 4.0 03/07/2022 1853   CL 104 03/07/2022 1853   CO2 23 03/07/2022 1853   GLUCOSE 167 (H) 03/07/2022 1853   BUN 12 03/07/2022 1853   CREATININE 1.01 (H) 03/07/2022 1853   CREATININE 0.84 01/31/2022 0859   CALCIUM 8.9 03/07/2022 1853   GFRNONAA >60 03/07/2022 1853   GFRNONAA 69 10/09/2017 1024   GFRAA >60 11/20/2018 0408   GFRAA 79 10/09/2017 1024    Lipid Panel     Component Value  Date/Time   CHOL 268 (H) 10/29/2021 0947   TRIG 212 (H) 10/29/2021 0947   HDL 47 (L) 10/29/2021 0947   CHOLHDL 5.7 (H) 10/29/2021 0947   VLDL 33.0 02/20/2017 0841   LDLCALC 181 (H) 10/29/2021 0947    CBC    Component Value Date/Time   WBC 8.3 01/31/2022 0859   RBC 4.79 01/31/2022 0859   HGB 13.7 01/31/2022 0859   HCT 41.6 01/31/2022 0859   PLT 400 01/31/2022 0859   MCV 86.8 01/31/2022 0859   MCH 28.6 01/31/2022 0859   MCHC 32.9 01/31/2022 0859   RDW 13.0 01/31/2022 0859   LYMPHSABS 2.2 07/18/2020 1054   MONOABS 0.8 07/18/2020 1054   EOSABS 0.1 07/18/2020 1054   BASOSABS 0.1 07/18/2020 1054    Hgb A1C Lab Results  Component Value Date   HGBA1C 5.5 10/29/2021           Assessment & Plan:   Exposure to Influenza:  Rapid flu negative Advised her to take Zyrtec and Flonase OTC Rx for Tamiflu 75 mg daily x10 days for prophylaxis  RTC in 1 month for follow-up of chronic conditions [name], NP

## 2022-04-22 ENCOUNTER — Other Ambulatory Visit: Payer: Self-pay | Admitting: Internal Medicine

## 2022-04-22 DIAGNOSIS — F419 Anxiety disorder, unspecified: Secondary | ICD-10-CM

## 2022-04-22 DIAGNOSIS — G4709 Other insomnia: Secondary | ICD-10-CM

## 2022-04-22 NOTE — Telephone Encounter (Signed)
Medication Refill - Medication: QUEtiapine (SEROQUEL) 200 MG tablet , PARoxetine (PAXIL) 40 MG tablet   Has the patient contacted their pharmacy? Yes.     Preferred Pharmacy (with phone number or street name):  [facility] [address] Phone: [phone]  Fax: [phone]     Has the patient been seen for an appointment in the last year OR does the patient have an upcoming appointment? Yes.    Please assist patient further as she only has a few left in on one of the meds and down to 1 on the Paxil

## 2022-04-23 MED ORDER — PAROXETINE HCL 40 MG PO TABS: ORAL_TABLET | ORAL | 0 refills | Status: DC

## 2022-04-23 NOTE — Telephone Encounter (Signed)
Pt is calling back to follow up on her medication refill. Pt stated she is completely out of medication PARoxetine (PAXIL) 40 MG tablet.  Please advise.

## 2022-04-23 NOTE — Telephone Encounter (Signed)
Notes to clinic: Duplicate request- non delegated Rx- see below  Requested Prescriptions  Pending Prescriptions Disp Refills   QUEtiapine (SEROQUEL) 200 MG tablet 90 tablet 0     Not Delegated - Psychiatry:  Antipsychotics - Second Generation (Atypical) - quetiapine Failed - 04/23/2022  1:30 PM      Failed - This refill cannot be delegated      Failed - TSH in normal range and within 360 days    TSH  Date Value Ref Range Status  04/23/2021 1.21 0.40 - 4.50 mIU/L Final         Failed - Lipid Panel in normal range within the last 12 months    Cholesterol  Date Value Ref Range Status  10/29/2021 268 (H) <200 mg/dL Final   LDL Cholesterol (Calc)  Date Value Ref Range Status  10/29/2021 181 (H) mg/dL (calc) Final    Comment:    Reference range: <100 . Desirable range <100 mg/dL for primary prevention;   <70 mg/dL for patients with CHD or diabetic patients  with > or = 2 CHD risk factors. Marland Kitchen LDL-C is now calculated using the Martin-Hopkins  calculation, which is a validated novel method providing  better accuracy than the Friedewald equation in the  estimation of LDL-C.  [name] et al. Annamaria Helling. 3094;076(80): 2061-2068  (http://education.[facility].com/faq/FAQ164)    HDL  Date Value Ref Range Status  10/29/2021 47 (L) > OR = 50 mg/dL Final   Triglycerides  Date Value Ref Range Status  10/29/2021 212 (H) <150 mg/dL Final    Comment:    . If a non-fasting specimen was collected, consider repeat triglyceride testing on a fasting specimen if clinically indicated.  [name] et al. J. of Clin. Lipidol. 8811;0:315-945. .          Failed - CBC within normal limits and completed in the last 12 months    WBC  Date Value Ref Range Status  01/31/2022 8.3 3.8 - 10.8 Thousand/uL Final   RBC  Date Value Ref Range Status  01/31/2022 4.79 3.80 - 5.10 Million/uL Final   Hemoglobin  Date Value Ref Range Status  01/31/2022 13.7 11.7 - 15.5 g/dL Final   HCT  Date  Value Ref Range Status  01/31/2022 41.6 35.0 - 45.0 % Final   MCHC  Date Value Ref Range Status  01/31/2022 32.9 32.0 - 36.0 g/dL Final   [name]  Date Value Ref Range Status  01/31/2022 28.6 27.0 - 33.0 pg Final   MCV  Date Value Ref Range Status  01/31/2022 86.8 80.0 - 100.0 fL Final   No results found for: "PLTCOUNTKUC", "LABPLAT", "POCPLA" RDW  Date Value Ref Range Status  01/31/2022 13.0 11.0 - 15.0 % Final         Passed - Completed PHQ-2 or PHQ-9 in the last 360 days      Passed - Last BP in normal range    BP Readings from Last 1 Encounters:  04/08/22 134/82         Passed - Last Heart Rate in normal range    Pulse Readings from Last 1 Encounters:  04/08/22 71         Passed - Valid encounter within last 6 months    Recent Outpatient Visits           2 weeks ago Exposure to the flu   [name], [name], NP   2 months ago [name],  [name], NP   2 months ago Fever of unknown origin   [name] [place], [name], NP   3 months ago History of revision of total replacement of right hip joint   [name], [name], NP   5 months ago Screening for colon cancer   [name] [place], [name], NP              Passed - CMP within normal limits and completed in the last 12 months    Albumin  Date Value Ref Range Status  03/07/2022 4.1 3.5 - 5.0 g/dL Final   Alkaline Phosphatase  Date Value Ref Range Status  03/07/2022 103 38 - 126 U/L Final   Alkaline phosphatase (APISO)  Date Value Ref Range Status  01/31/2022 134 37 - 153 U/L Final   ALT  Date Value Ref Range Status  03/07/2022 28 0 - 44 U/L Final   AST  Date Value Ref Range Status  03/07/2022 31 15 - 41 U/L Final   BUN  Date Value Ref Range Status  03/07/2022 12 8 - 23 mg/dL Final   Calcium  Date Value Ref Range Status  03/07/2022 8.9 8.9 - 10.3 mg/dL Final   CO2  Date  Value Ref Range Status  03/07/2022 23 22 - 32 mmol/L Final   Creat  Date Value Ref Range Status  01/31/2022 0.84 0.50 - 1.05 mg/dL Final   Creatinine, Ser  Date Value Ref Range Status  03/07/2022 1.01 (H) 0.44 - 1.00 mg/dL Final   Glucose, Bld  Date Value Ref Range Status  03/07/2022 167 (H) 70 - 99 mg/dL Final    Comment:    Glucose reference range applies only to samples taken after fasting for at least 8 hours.   Potassium  Date Value Ref Range Status  03/07/2022 4.0 3.5 - 5.1 mmol/L Final    Comment:    HEMOLYSIS AT THIS LEVEL MAY AFFECT RESULT   Sodium  Date Value Ref Range Status  03/07/2022 138 135 - 145 mmol/L Final   Total Bilirubin  Date Value Ref Range Status  03/07/2022 0.6 0.3 - 1.2 mg/dL Final   Protein, ur  Date Value Ref Range Status  12/14/2021 NEGATIVE NEGATIVE mg/dL Final   Protein, UA  Date Value Ref Range Status  01/31/2022 Negative Negative Final   Total Protein  Date Value Ref Range Status  03/07/2022 7.4 6.5 - 8.1 g/dL Final   GFR, Est African American  Date Value Ref Range Status  10/09/2017 79 > OR = 60 mL/min/1.2m Final   GFR calc Af Amer  Date Value Ref Range Status  11/20/2018 >60 >60 mL/min Final   GFR  Date Value Ref Range Status  02/20/2017 76.18 >60.00 mL/min Final   eGFR  Date Value Ref Range Status  01/31/2022 77 > OR = 60 mL/min/1.745mFinal   GFR, Est Non African American  Date Value Ref Range Status  10/09/2017 69 > OR = 60 mL/min/1.7334minal   GFR, Estimated  Date Value Ref Range Status  03/07/2022 >60 >60 mL/min Final    Comment:    (NOTE) Calculated using the CKD-EPI Creatinine Equation (2021)          Signed Prescriptions Disp Refills   PARoxetine (PAXIL) 40 MG tablet 90 tablet 0    Sig: TAKE 1 TABLET(40 MG) BY MOUTH AT BEDTIME     Psychiatry:  Antidepressants - SSRI Passed - 04/23/2022  1:30 PM  Passed - Completed PHQ-2 or PHQ-9 in the last 360 days      Passed - Valid encounter within  last 6 months    Recent Outpatient Visits           2 weeks ago Exposure to the flu   [facility], [name], NP   2 months ago [facility] [place], [name], NP   2 months ago Fever of unknown origin   [facility] [name], [place] W, NP   3 months ago History of revision of total replacement of right hip joint   [facility] [place], [place], NP   5 months ago Screening for colon cancer   [facility] [place], [name], NP                 Requested Prescriptions  Pending Prescriptions Disp Refills   QUEtiapine (SEROQUEL) 200 MG tablet 90 tablet 0     Not Delegated - Psychiatry:  Antipsychotics - Second Generation (Atypical) - quetiapine Failed - 04/23/2022  1:30 PM      Failed - This refill cannot be delegated      Failed - TSH in normal range and within 360 days    TSH  Date Value Ref Range Status  04/23/2021 1.21 0.40 - 4.50 mIU/L Final         Failed - Lipid Panel in normal range within the last 12 months    Cholesterol  Date Value Ref Range Status  10/29/2021 268 (H) <200 mg/dL Final   LDL Cholesterol (Calc)  Date Value Ref Range Status  10/29/2021 181 (H) mg/dL (calc) Final    Comment:    Reference range: <100 . Desirable range <100 mg/dL for primary prevention;   <70 mg/dL for patients with CHD or diabetic patients  with > or = 2 CHD risk factors. Marland Kitchen LDL-C is now calculated using the Martin-Hopkins  calculation, which is a validated novel method providing  better accuracy than the Friedewald equation in the  estimation of LDL-C.  [name] et al. Annamaria Helling. 3419;622(29): 2061-2068  (http://education.[facility].com/faq/FAQ164)    HDL  Date Value Ref Range Status  10/29/2021 47 (L) > OR = 50 mg/dL Final   Triglycerides  Date Value Ref Range Status  10/29/2021 212 (H) <150 mg/dL Final    Comment:    . If a non-fasting specimen was collected, consider repeat  triglyceride testing on a fasting specimen if clinically indicated.  [name] et al. J. of Clin. Lipidol. 7989;2:119-417. .          Failed - CBC within normal limits and completed in the last 12 months    WBC  Date Value Ref Range Status  01/31/2022 8.3 3.8 - 10.8 Thousand/uL Final   RBC  Date Value Ref Range Status  01/31/2022 4.79 3.80 - 5.10 Million/uL Final   Hemoglobin  Date Value Ref Range Status  01/31/2022 13.7 11.7 - 15.5 g/dL Final   HCT  Date Value Ref Range Status  01/31/2022 41.6 35.0 - 45.0 % Final   MCHC  Date Value Ref Range Status  01/31/2022 32.9 32.0 - 36.0 g/dL Final   [facility]  Date Value Ref Range Status  01/31/2022 28.6 27.0 - 33.0 pg Final   MCV  Date Value Ref Range Status  01/31/2022 86.8 80.0 - 100.0 fL Final   No results found for: "PLTCOUNTKUC", "LABPLAT", "POCPLA" RDW  Date Value Ref Range Status  01/31/2022 13.0 11.0 - 15.0 % Final         Passed - Completed PHQ-2 or PHQ-9 in the last 360 days      Passed - Last BP in normal range    BP Readings from Last 1 Encounters:  04/08/22 134/82         Passed - Last Heart Rate in normal range    Pulse Readings from Last 1 Encounters:  04/08/22 71         Passed - Valid encounter within last 6 months    Recent Outpatient Visits           2 weeks ago Exposure to the flu   [facility] [place], [name], NP   2 months ago [facility] [place], [name], NP   2 months ago Fever of unknown origin   [facility] [place], [name], NP   3 months ago History of revision of total replacement of right hip joint   [facility] [place], [name], NP   5 months ago Screening for colon cancer   [facility] [place], [name], NP              Passed - CMP within normal limits and completed in the last 12 months    Albumin  Date Value Ref Range Status  03/07/2022 4.1 3.5 - 5.0 g/dL Final   Alkaline Phosphatase   Date Value Ref Range Status  03/07/2022 103 38 - 126 U/L Final   Alkaline phosphatase (APISO)  Date Value Ref Range Status  01/31/2022 134 37 - 153 U/L Final   ALT  Date Value Ref Range Status  03/07/2022 28 0 - 44 U/L Final   AST  Date Value Ref Range Status  03/07/2022 31 15 - 41 U/L Final   BUN  Date Value Ref Range Status  03/07/2022 12 8 - 23 mg/dL Final   Calcium  Date Value Ref Range Status  03/07/2022 8.9 8.9 - 10.3 mg/dL Final   CO2  Date Value Ref Range Status  03/07/2022 23 22 - 32 mmol/L Final   Creat  Date Value Ref Range Status  01/31/2022 0.84 0.50 - 1.05 mg/dL Final   Creatinine, Ser  Date Value Ref Range Status  03/07/2022 1.01 (H) 0.44 - 1.00 mg/dL Final   Glucose, Bld  Date Value Ref Range Status  03/07/2022 167 (H) 70 - 99 mg/dL Final    Comment:    Glucose reference range applies only to samples taken after fasting for at least 8 hours.   Potassium  Date Value Ref Range Status  03/07/2022 4.0 3.5 - 5.1 mmol/L Final    Comment:    HEMOLYSIS AT THIS LEVEL MAY AFFECT RESULT   Sodium  Date Value Ref Range Status  03/07/2022 138 135 - 145 mmol/L Final   Total Bilirubin  Date Value Ref Range Status  03/07/2022 0.6 0.3 - 1.2 mg/dL Final   Protein, ur  Date Value Ref Range Status  12/14/2021 NEGATIVE NEGATIVE mg/dL Final   Protein, UA  Date Value Ref Range Status  01/31/2022 Negative Negative Final   Total Protein  Date Value Ref Range Status  03/07/2022 7.4 6.5 - 8.1 g/dL Final   GFR, Est African American  Date Value Ref Range Status  10/09/2017 79 > OR = 60 mL/min/1.58m Final   GFR calc Af Amer  Date Value Ref Range Status  11/20/2018 >  60 >60 mL/min Final   GFR  Date Value Ref Range Status  02/20/2017 76.18 >60.00 mL/min Final   eGFR  Date Value Ref Range Status  01/31/2022 77 > OR = 60 mL/min/1.35m Final   GFR, Est Non African American  Date Value Ref Range Status  10/09/2017 69 > OR = 60 mL/min/1.72mFinal    GFR, Estimated  Date Value Ref Range Status  03/07/2022 >60 >60 mL/min Final    Comment:    (NOTE) Calculated using the CKD-EPI Creatinine Equation (2021)          Signed Prescriptions Disp Refills   PARoxetine (PAXIL) 40 MG tablet 90 tablet 0    Sig: TAKE 1 TABLET(40 MG) BY MOUTH AT BEDTIME     Psychiatry:  Antidepressants - SSRI Passed - 04/23/2022  1:30 PM      Passed - Completed PHQ-2 or PHQ-9 in the last 360 days      Passed - Valid encounter within last 6 months    Recent Outpatient Visits           2 weeks ago [place] to the flu   [place]   [facility]   2 months ago Fever of unknown origin   [facility]   3 months ago History of revision of total replacement of right hip joint   [facility]   5 months ago Screening for colon cancer   [facility]

## 2022-04-23 NOTE — Telephone Encounter (Signed)
Requested medication (s) are due for refill today - yes  Requested medication (s) are on the active medication list -yes  Future visit scheduled -no  Last refill: 12/19/21 #90  Notes to clinic: non delegated Rx  Requested Prescriptions  Pending Prescriptions Disp Refills   QUEtiapine (SEROQUEL) 200 MG tablet 90 tablet 0     Not Delegated - Psychiatry:  Antipsychotics - Second Generation (Atypical) - quetiapine Failed - 04/23/2022  1:30 PM      Failed - This refill cannot be delegated      Failed - TSH in normal range and within 360 days    TSH  Date Value Ref Range Status  04/23/2021 1.21 0.40 - 4.50 mIU/L Final         Failed - Lipid Panel in normal range within the last 12 months    Cholesterol  Date Value Ref Range Status  10/29/2021 268 (H) <200 mg/dL Final   LDL Cholesterol (Calc)  Date Value Ref Range Status  10/29/2021 181 (H) mg/dL (calc) Final    Comment:    Reference range: <100 . Desirable range <100 mg/dL for primary prevention;   <70 mg/dL for patients with CHD or diabetic patients  with > or = 2 CHD risk factors. Marland Kitchen LDL-C is now calculated using the Martin-Hopkins  calculation, which is a validated novel method providing  better accuracy than the Friedewald equation in the  estimation of LDL-C.  [name] et al. Annamaria Helling. 5361;443(15): 2061-2068  (http://education.[facility].com/faq/FAQ164)    HDL  Date Value Ref Range Status  10/29/2021 47 (L) > OR = 50 mg/dL Final   Triglycerides  Date Value Ref Range Status  10/29/2021 212 (H) <150 mg/dL Final    Comment:    . If a non-fasting specimen was collected, consider repeat triglyceride testing on a fasting specimen if clinically indicated.  [name] et al. J. of Clin. Lipidol. 4008;6:761-950. .          Failed - CBC within normal limits and completed in the last 12 months    WBC  Date Value Ref Range Status  01/31/2022 8.3 3.8 - 10.8 Thousand/uL Final   RBC  Date Value Ref Range Status   01/31/2022 4.79 3.80 - 5.10 Million/uL Final   Hemoglobin  Date Value Ref Range Status  01/31/2022 13.7 11.7 - 15.5 g/dL Final   HCT  Date Value Ref Range Status  01/31/2022 41.6 35.0 - 45.0 % Final   MCHC  Date Value Ref Range Status  01/31/2022 32.9 32.0 - 36.0 g/dL Final   [facility]  Date Value Ref Range Status  01/31/2022 28.6 27.0 - 33.0 pg Final   MCV  Date Value Ref Range Status  01/31/2022 86.8 80.0 - 100.0 fL Final   No results found for: "PLTCOUNTKUC", "LABPLAT", "POCPLA" RDW  Date Value Ref Range Status  01/31/2022 13.0 11.0 - 15.0 % Final         Passed - Completed PHQ-2 or PHQ-9 in the last 360 days      Passed - Last BP in normal range    BP Readings from Last 1 Encounters:  04/08/22 134/82         Passed - Last Heart Rate in normal range    Pulse Readings from Last 1 Encounters:  04/08/22 71         Passed - Valid encounter within last 6 months    Recent Outpatient Visits           2 weeks ago  Exposure to the flu   [facility] [place], [name], NP   2 months ago [facility] [place], [name], NP   2 months ago Fever of unknown origin   [facility] [place], [name], NP   3 months ago History of revision of total replacement of right hip joint   [facility] [place], [name], NP   5 months ago Screening for colon cancer   [facility] [place], [name], NP              Passed - CMP within normal limits and completed in the last 12 months    Albumin  Date Value Ref Range Status  03/07/2022 4.1 3.5 - 5.0 g/dL Final   Alkaline Phosphatase  Date Value Ref Range Status  03/07/2022 103 38 - 126 U/L Final   Alkaline phosphatase (APISO)  Date Value Ref Range Status  01/31/2022 134 37 - 153 U/L Final   ALT  Date Value Ref Range Status  03/07/2022 28 0 - 44 U/L Final   AST  Date Value Ref Range Status  03/07/2022 31 15 - 41 U/L Final   BUN  Date Value Ref  Range Status  03/07/2022 12 8 - 23 mg/dL Final   Calcium  Date Value Ref Range Status  03/07/2022 8.9 8.9 - 10.3 mg/dL Final   CO2  Date Value Ref Range Status  03/07/2022 23 22 - 32 mmol/L Final   Creat  Date Value Ref Range Status  01/31/2022 0.84 0.50 - 1.05 mg/dL Final   Creatinine, Ser  Date Value Ref Range Status  03/07/2022 1.01 (H) 0.44 - 1.00 mg/dL Final   Glucose, Bld  Date Value Ref Range Status  03/07/2022 167 (H) 70 - 99 mg/dL Final    Comment:    Glucose reference range applies only to samples taken after fasting for at least 8 hours.   Potassium  Date Value Ref Range Status  03/07/2022 4.0 3.5 - 5.1 mmol/L Final    Comment:    HEMOLYSIS AT THIS LEVEL MAY AFFECT RESULT   Sodium  Date Value Ref Range Status  03/07/2022 138 135 - 145 mmol/L Final   Total Bilirubin  Date Value Ref Range Status  03/07/2022 0.6 0.3 - 1.2 mg/dL Final   Protein, ur  Date Value Ref Range Status  12/14/2021 NEGATIVE NEGATIVE mg/dL Final   Protein, UA  Date Value Ref Range Status  01/31/2022 Negative Negative Final   Total Protein  Date Value Ref Range Status  03/07/2022 7.4 6.5 - 8.1 g/dL Final   GFR, Est African American  Date Value Ref Range Status  10/09/2017 79 > OR = 60 mL/min/1.23m Final   GFR calc Af Amer  Date Value Ref Range Status  11/20/2018 >60 >60 mL/min Final   GFR  Date Value Ref Range Status  02/20/2017 76.18 >60.00 mL/min Final   eGFR  Date Value Ref Range Status  01/31/2022 77 > OR = 60 mL/min/1.712mFinal   GFR, Est Non African American  Date Value Ref Range Status  10/09/2017 69 > OR = 60 mL/min/1.7332minal   GFR, Estimated  Date Value Ref Range Status  03/07/2022 >60 >60 mL/min Final    Comment:    (NOTE) Calculated using the CKD-EPI Creatinine Equation (2021)          Signed Prescriptions Disp Refills   PARoxetine (PAXIL) 40 MG tablet 90 tablet 0  Sig: TAKE 1 TABLET(40 MG) BY MOUTH AT BEDTIME     Psychiatry:   Antidepressants - SSRI Passed - 04/23/2022  1:30 PM      Passed - Completed PHQ-2 or PHQ-9 in the last 360 days      Passed - Valid encounter within last 6 months    Recent Outpatient Visits           2 weeks ago Exposure to the flu   [facility] [place], [name], NP   2 months ago [facility] [place], [name], NP   2 months ago Fever of unknown origin   [facility] [place], [place] W, NP   3 months ago History of revision of total replacement of right hip joint   [facility] [place], [place], NP   5 months ago Screening for colon cancer   [facility] [place], [name], NP                 Requested Prescriptions  Pending Prescriptions Disp Refills   QUEtiapine (SEROQUEL) 200 MG tablet 90 tablet 0     Not Delegated - Psychiatry:  Antipsychotics - Second Generation (Atypical) - quetiapine Failed - 04/23/2022  1:30 PM      Failed - This refill cannot be delegated      Failed - TSH in normal range and within 360 days    TSH  Date Value Ref Range Status  04/23/2021 1.21 0.40 - 4.50 mIU/L Final         Failed - Lipid Panel in normal range within the last 12 months    Cholesterol  Date Value Ref Range Status  10/29/2021 268 (H) <200 mg/dL Final   LDL Cholesterol (Calc)  Date Value Ref Range Status  10/29/2021 181 (H) mg/dL (calc) Final    Comment:    Reference range: <100 . Desirable range <100 mg/dL for primary prevention;   <70 mg/dL for patients with CHD or diabetic patients  with > or = 2 CHD risk factors. Marland Kitchen LDL-C is now calculated using the Martin-Hopkins  calculation, which is a validated novel method providing  better accuracy than the Friedewald equation in the  estimation of LDL-C.  [name] et al. Annamaria Helling. 7510;258(52): 2061-2068  (http://education.[facility].com/faq/FAQ164)    HDL  Date Value Ref Range Status  10/29/2021 47 (L) > OR = 50 mg/dL Final    Triglycerides  Date Value Ref Range Status  10/29/2021 212 (H) <150 mg/dL Final    Comment:    . If a non-fasting specimen was collected, consider repeat triglyceride testing on a fasting specimen if clinically indicated.  [name] et al. J. of Clin. Lipidol. 7782;4:235-361. .          Failed - CBC within normal limits and completed in the last 12 months    WBC  Date Value Ref Range Status  01/31/2022 8.3 3.8 - 10.8 Thousand/uL Final   RBC  Date Value Ref Range Status  01/31/2022 4.79 3.80 - 5.10 Million/uL Final   Hemoglobin  Date Value Ref Range Status  01/31/2022 13.7 11.7 - 15.5 g/dL Final   HCT  Date Value Ref Range Status  01/31/2022 41.6 35.0 - 45.0 % Final   MCHC  Date Value Ref Range Status  01/31/2022 32.9 32.0 - 36.0 g/dL Final   [facility]  Date Value Ref Range Status  01/31/2022 28.6 27.0 - 33.0 pg Final   MCV  Date Value Ref Range Status  01/31/2022 86.8 80.0 - 100.0 fL Final   No results found for: "PLTCOUNTKUC", "LABPLAT", "POCPLA" RDW  Date Value Ref Range Status  01/31/2022 13.0 11.0 - 15.0 % Final         Passed - Completed PHQ-2 or PHQ-9 in the last 360 days      Passed - Last BP in normal range    BP Readings from Last 1 Encounters:  04/08/22 134/82         Passed - Last Heart Rate in normal range    Pulse Readings from Last 1 Encounters:  04/08/22 71         Passed - Valid encounter within last 6 months    Recent Outpatient Visits           2 weeks ago Exposure to the flu   [place], [name], NP   2 months ago [facility] [place], [name], NP   2 months ago Fever of unknown origin   [facility] [place], [name], NP   3 months ago History of revision of total replacement of right hip joint   [facility] [place] [place], [name], NP   5 months ago Screening for colon cancer   [facility] [place], [name], NP              Passed -  CMP within normal limits and completed in the last 12 months    Albumin  Date Value Ref Range Status  03/07/2022 4.1 3.5 - 5.0 g/dL Final   Alkaline Phosphatase  Date Value Ref Range Status  03/07/2022 103 38 - 126 U/L Final   Alkaline phosphatase (APISO)  Date Value Ref Range Status  01/31/2022 134 37 - 153 U/L Final   ALT  Date Value Ref Range Status  03/07/2022 28 0 - 44 U/L Final   AST  Date Value Ref Range Status  03/07/2022 31 15 - 41 U/L Final   BUN  Date Value Ref Range Status  03/07/2022 12 8 - 23 mg/dL Final   Calcium  Date Value Ref Range Status  03/07/2022 8.9 8.9 - 10.3 mg/dL Final   CO2  Date Value Ref Range Status  03/07/2022 23 22 - 32 mmol/L Final   Creat  Date Value Ref Range Status  01/31/2022 0.84 0.50 - 1.05 mg/dL Final   Creatinine, Ser  Date Value Ref Range Status  03/07/2022 1.01 (H) 0.44 - 1.00 mg/dL Final   Glucose, Bld  Date Value Ref Range Status  03/07/2022 167 (H) 70 - 99 mg/dL Final    Comment:    Glucose reference range applies only to samples taken after fasting for at least 8 hours.   Potassium  Date Value Ref Range Status  03/07/2022 4.0 3.5 - 5.1 mmol/L Final    Comment:    HEMOLYSIS AT THIS LEVEL MAY AFFECT RESULT   Sodium  Date Value Ref Range Status  03/07/2022 138 135 - 145 mmol/L Final   Total Bilirubin  Date Value Ref Range Status  03/07/2022 0.6 0.3 - 1.2 mg/dL Final   Protein, ur  Date Value Ref Range Status  12/14/2021 NEGATIVE NEGATIVE mg/dL Final   Protein, UA  Date Value Ref Range Status  01/31/2022 Negative Negative Final   Total Protein  Date Value Ref Range Status  03/07/2022 7.4 6.5 - 8.1 g/dL Final   GFR, Est African  American  Date Value Ref Range Status  10/09/2017 79 > OR = 60 mL/min/1.58m Final   GFR calc Af Amer  Date Value Ref Range Status  11/20/2018 >60 >60 mL/min Final   GFR  Date Value Ref Range Status  02/20/2017 76.18 >60.00 mL/min Final   eGFR  Date Value Ref Range  Status  01/31/2022 77 > OR = 60 mL/min/1.774mFinal   GFR, Est Non African American  Date Value Ref Range Status  10/09/2017 69 > OR = 60 mL/min/1.7343minal   GFR, Estimated  Date Value Ref Range Status  03/07/2022 >60 >60 mL/min Final    Comment:    (NOTE) Calculated using the CKD-EPI Creatinine Equation (2021)          Signed Prescriptions Disp Refills   PARoxetine (PAXIL) 40 MG tablet 90 tablet 0    Sig: TAKE 1 TABLET(40 MG) BY MOUTH AT BEDTIME     Psychiatry:  Antidepressants - SSRI Passed - 04/23/2022  1:30 PM      Passed - Completed PHQ-2 or PHQ-9 in the last 360 days      Passed - Valid encounter within last 6 months    Recent Outpatient Visits           2 weeks ago [facility] to the flu   [facility]   [facility]   2 months ago Fever of unknown origin   [facility]   3 months ago History of revision of total replacement of right hip joint   [facility]   5 months ago Screening for colon cancer   [facility]

## 2022-04-23 NOTE — Telephone Encounter (Signed)
Requested Prescriptions  Pending Prescriptions Disp Refills   PARoxetine (PAXIL) 40 MG tablet 90 tablet 0    Sig: TAKE 1 TABLET(40 MG) BY MOUTH AT BEDTIME     Psychiatry:  Antidepressants - SSRI Passed - 04/23/2022  1:30 PM      Passed - Completed PHQ-2 or PHQ-9 in the last 360 days      Passed - Valid encounter within last 6 months    Recent Outpatient Visits           2 weeks ago Exposure to the flu   [facility] [place], [name], NP   2 months ago [facility] [place], [name], NP   2 months ago Fever of unknown origin   [facility] [place], [place] W, NP   3 months ago History of revision of total replacement of right hip joint   [facility] [place], [place], NP   5 months ago Screening for colon cancer   [facility] [place], [name], NP               QUEtiapine (SEROQUEL) 200 MG tablet 90 tablet 0     Not Delegated - Psychiatry:  Antipsychotics - Second Generation (Atypical) - quetiapine Failed - 04/23/2022  1:30 PM      Failed - This refill cannot be delegated      Failed - TSH in normal range and within 360 days    TSH  Date Value Ref Range Status  04/23/2021 1.21 0.40 - 4.50 mIU/L Final         Failed - Lipid Panel in normal range within the last 12 months    Cholesterol  Date Value Ref Range Status  10/29/2021 268 (H) <200 mg/dL Final   LDL Cholesterol (Calc)  Date Value Ref Range Status  10/29/2021 181 (H) mg/dL (calc) Final    Comment:    Reference range: <100 . Desirable range <100 mg/dL for primary prevention;   <70 mg/dL for patients with CHD or diabetic patients  with > or = 2 CHD risk factors. Marland Kitchen LDL-C is now calculated using the Martin-Hopkins  calculation, which is a validated novel method providing  better accuracy than the Friedewald equation in the  estimation of LDL-C.  [name] et al. Annamaria Helling. 5284;132(44): 2061-2068   (http://education.[facility].com/faq/FAQ164)    HDL  Date Value Ref Range Status  10/29/2021 47 (L) > OR = 50 mg/dL Final   Triglycerides  Date Value Ref Range Status  10/29/2021 212 (H) <150 mg/dL Final    Comment:    . If a non-fasting specimen was collected, consider repeat triglyceride testing on a fasting specimen if clinically indicated.  [name] et al. J. of Clin. Lipidol. 0102;7:253-664. .          Failed - CBC within normal limits and completed in the last 12 months    WBC  Date Value Ref Range Status  01/31/2022 8.3 3.8 - 10.8 Thousand/uL Final   RBC  Date Value Ref Range Status  01/31/2022 4.79 3.80 - 5.10 Million/uL Final   Hemoglobin  Date Value Ref Range Status  01/31/2022 13.7 11.7 - 15.5 g/dL Final   HCT  Date Value Ref Range Status  01/31/2022 41.6 35.0 - 45.0 % Final   MCHC  Date Value Ref Range Status  01/31/2022 32.9 32.0 - 36.0 g/dL Final   [facility]  Date Value Ref Range Status  01/31/2022 28.6 27.0 -  33.0 pg Final   MCV  Date Value Ref Range Status  01/31/2022 86.8 80.0 - 100.0 fL Final   No results found for: "PLTCOUNTKUC", "LABPLAT", "POCPLA" RDW  Date Value Ref Range Status  01/31/2022 13.0 11.0 - 15.0 % Final         Passed - Completed PHQ-2 or PHQ-9 in the last 360 days      Passed - Last BP in normal range    BP Readings from Last 1 Encounters:  04/08/22 134/82         Passed - Last Heart Rate in normal range    Pulse Readings from Last 1 Encounters:  04/08/22 71         Passed - Valid encounter within last 6 months    Recent Outpatient Visits           2 weeks ago Exposure to the flu   [facility] [place], [name], NP   2 months ago [facility] [place], [name], NP   2 months ago Fever of unknown origin   [facility] [place], [name], NP   3 months ago History of revision of total replacement of right hip joint   [facility] [place],  [name], NP   5 months ago Screening for colon cancer   [facility] [place], [name], NP              Passed - CMP within normal limits and completed in the last 12 months    Albumin  Date Value Ref Range Status  03/07/2022 4.1 3.5 - 5.0 g/dL Final   Alkaline Phosphatase  Date Value Ref Range Status  03/07/2022 103 38 - 126 U/L Final   Alkaline phosphatase (APISO)  Date Value Ref Range Status  01/31/2022 134 37 - 153 U/L Final   ALT  Date Value Ref Range Status  03/07/2022 28 0 - 44 U/L Final   AST  Date Value Ref Range Status  03/07/2022 31 15 - 41 U/L Final   BUN  Date Value Ref Range Status  03/07/2022 12 8 - 23 mg/dL Final   Calcium  Date Value Ref Range Status  03/07/2022 8.9 8.9 - 10.3 mg/dL Final   CO2  Date Value Ref Range Status  03/07/2022 23 22 - 32 mmol/L Final   Creat  Date Value Ref Range Status  01/31/2022 0.84 0.50 - 1.05 mg/dL Final   Creatinine, Ser  Date Value Ref Range Status  03/07/2022 1.01 (H) 0.44 - 1.00 mg/dL Final   Glucose, Bld  Date Value Ref Range Status  03/07/2022 167 (H) 70 - 99 mg/dL Final    Comment:    Glucose reference range applies only to samples taken after fasting for at least 8 hours.   Potassium  Date Value Ref Range Status  03/07/2022 4.0 3.5 - 5.1 mmol/L Final    Comment:    HEMOLYSIS AT THIS LEVEL MAY AFFECT RESULT   Sodium  Date Value Ref Range Status  03/07/2022 138 135 - 145 mmol/L Final   Total Bilirubin  Date Value Ref Range Status  03/07/2022 0.6 0.3 - 1.2 mg/dL Final   Protein, ur  Date Value Ref Range Status  12/14/2021 NEGATIVE NEGATIVE mg/dL Final   Protein, UA  Date Value Ref Range Status  01/31/2022 Negative Negative Final   Total Protein  Date Value Ref Range Status  03/07/2022 7.4 6.5 - 8.1  g/dL Final   GFR, Est African American  Date Value Ref Range Status  10/09/2017 79 > OR = 60 mL/min/1.9m Final   GFR calc Af Amer  Date Value Ref Range Status   11/20/2018 >60 >60 mL/min Final   GFR  Date Value Ref Range Status  02/20/2017 76.18 >60.00 mL/min Final   eGFR  Date Value Ref Range Status  01/31/2022 77 > OR = 60 mL/min/1.744mFinal   GFR, Est Non African American  Date Value Ref Range Status  10/09/2017 69 > OR = 60 mL/min/1.7354minal   GFR, Estimated  Date Value Ref Range Status  03/07/2022 >60 >60 mL/min Final    Comment:    (NOTE) Calculated using the CKD-EPI Creatinine Equation (2021)

## 2022-04-24 ENCOUNTER — Other Ambulatory Visit: Payer: Self-pay | Admitting: Internal Medicine

## 2022-04-24 ENCOUNTER — Other Ambulatory Visit: Payer: Self-pay | Admitting: *Deleted

## 2022-04-24 DIAGNOSIS — G4709 Other insomnia: Secondary | ICD-10-CM

## 2022-04-24 NOTE — Telephone Encounter (Signed)
Called to request why medication refused. Scheduled patient appt for 05/01/22. Patient will take last pill tonight .

## 2022-04-24 NOTE — Telephone Encounter (Signed)
Copied from [place] [id number]. Topic: General - Other >> Apr 24, 2022 11:47 AM Everette C wrote: Reason for CRM: Medication Refill - Medication: QUEtiapine (SEROQUEL) 200 MG tablet [[id number]]  Has the patient contacted their pharmacy? Yes.  The patient has been directed to contact their PCP  (Agent: If no, request that the patient contact the pharmacy for the refill. If patient does not wish to contact the pharmacy document the reason why and proceed with request.) (Agent: If yes, when and what did the pharmacy advise?)  Preferred Pharmacy (with phone number or street name): [facility] [place], [place] - [address] [place] [address] Phone: [phone] Fax: [phone] Hours: Not open 24 hours   Has the patient been seen for an appointment in the last year OR does the patient have an upcoming appointment? Yes.    Agent: Please be advised that RX refills may take up to 3 business days. We ask that you follow-up with your pharmacy.

## 2022-04-24 NOTE — Telephone Encounter (Signed)
Requested medication (s) are due for refill today: yes  Requested medication (s) are on the active medication list: yes  Last refill:  12/19/21 #90 0 refills  Future visit scheduled: yes in 1 week  Notes to clinic:  not delegate per protocol. Patient will take last pill tonight . Do you want to refill Rx?     Requested Prescriptions  Pending Prescriptions Disp Refills   QUEtiapine (SEROQUEL) 200 MG tablet [Pharmacy Med Name: QUETIAPINE 200MG TABLETS] 90 tablet 0    Sig: TAKE 1 TABLET(200 MG) BY MOUTH AT BEDTIME     Not Delegated - Psychiatry:  Antipsychotics - Second Generation (Atypical) - quetiapine Failed - 04/24/2022 11:31 AM      Failed - This refill cannot be delegated      Failed - TSH in normal range and within 360 days    TSH  Date Value Ref Range Status  04/23/2021 1.21 0.40 - 4.50 mIU/L Final         Failed - Lipid Panel in normal range within the last 12 months    Cholesterol  Date Value Ref Range Status  10/29/2021 268 (H) <200 mg/dL Final   LDL Cholesterol (Calc)  Date Value Ref Range Status  10/29/2021 181 (H) mg/dL (calc) Final    Comment:    Reference range: <100 . Desirable range <100 mg/dL for primary prevention;   <70 mg/dL for patients with CHD or diabetic patients  with > or = 2 CHD risk factors. Marland Kitchen LDL-C is now calculated using the Martin-Hopkins  calculation, which is a validated novel method providing  better accuracy than the Friedewald equation in the  estimation of LDL-C.  [name] et al. Annamaria Helling. 0786;754(49): 2061-2068  (http://education.[facility].com/faq/FAQ164)    HDL  Date Value Ref Range Status  10/29/2021 47 (L) > OR = 50 mg/dL Final   Triglycerides  Date Value Ref Range Status  10/29/2021 212 (H) <150 mg/dL Final    Comment:    . If a non-fasting specimen was collected, consider repeat triglyceride testing on a fasting specimen if clinically indicated.  [name] et al. J. of Clin. Lipidol. 2010;0:712-197. .           Failed - CBC within normal limits and completed in the last 12 months    WBC  Date Value Ref Range Status  01/31/2022 8.3 3.8 - 10.8 Thousand/uL Final   RBC  Date Value Ref Range Status  01/31/2022 4.79 3.80 - 5.10 Million/uL Final   Hemoglobin  Date Value Ref Range Status  01/31/2022 13.7 11.7 - 15.5 g/dL Final   HCT  Date Value Ref Range Status  01/31/2022 41.6 35.0 - 45.0 % Final   MCHC  Date Value Ref Range Status  01/31/2022 32.9 32.0 - 36.0 g/dL Final   [facility]  Date Value Ref Range Status  01/31/2022 28.6 27.0 - 33.0 pg Final   MCV  Date Value Ref Range Status  01/31/2022 86.8 80.0 - 100.0 fL Final   No results found for: "PLTCOUNTKUC", "LABPLAT", "POCPLA" RDW  Date Value Ref Range Status  01/31/2022 13.0 11.0 - 15.0 % Final         Passed - Completed PHQ-2 or PHQ-9 in the last 360 days      Passed - Last BP in normal range    BP Readings from Last 1 Encounters:  04/08/22 134/82         Passed - Last Heart Rate in normal range    Pulse Readings from Last  1 Encounters:  04/08/22 71         Passed - Valid encounter within last 6 months    Recent Outpatient Visits           2 weeks ago Exposure to the flu   [facility] [place], [name], NP   2 months ago [facility] [place], [name], NP   2 months ago Fever of unknown origin   [facility] [place], [name], NP   3 months ago History of revision of total replacement of right hip joint   [facility] [place], [name], NP   5 months ago Screening for colon cancer   [facility] [place], [name], NP       Future Appointments             In 1 week Garnette Gunner, [name], NP [facility] [place] within normal limits and completed in the last 12 months    Albumin  Date Value Ref Range Status  03/07/2022 4.1 3.5 - 5.0 g/dL Final   Alkaline Phosphatase  Date Value Ref Range  Status  03/07/2022 103 38 - 126 U/L Final   Alkaline phosphatase (APISO)  Date Value Ref Range Status  01/31/2022 134 37 - 153 U/L Final   ALT  Date Value Ref Range Status  03/07/2022 28 0 - 44 U/L Final   AST  Date Value Ref Range Status  03/07/2022 31 15 - 41 U/L Final   BUN  Date Value Ref Range Status  03/07/2022 12 8 - 23 mg/dL Final   Calcium  Date Value Ref Range Status  03/07/2022 8.9 8.9 - 10.3 mg/dL Final   CO2  Date Value Ref Range Status  03/07/2022 23 22 - 32 mmol/L Final   Creat  Date Value Ref Range Status  01/31/2022 0.84 0.50 - 1.05 mg/dL Final   Creatinine, Ser  Date Value Ref Range Status  03/07/2022 1.01 (H) 0.44 - 1.00 mg/dL Final   Glucose, Bld  Date Value Ref Range Status  03/07/2022 167 (H) 70 - 99 mg/dL Final    Comment:    Glucose reference range applies only to samples taken after fasting for at least 8 hours.   Potassium  Date Value Ref Range Status  03/07/2022 4.0 3.5 - 5.1 mmol/L Final    Comment:    HEMOLYSIS AT THIS LEVEL MAY AFFECT RESULT   Sodium  Date Value Ref Range Status  03/07/2022 138 135 - 145 mmol/L Final   Total Bilirubin  Date Value Ref Range Status  03/07/2022 0.6 0.3 - 1.2 mg/dL Final   Protein, ur  Date Value Ref Range Status  12/14/2021 NEGATIVE NEGATIVE mg/dL Final   Protein, UA  Date Value Ref Range Status  01/31/2022 Negative Negative Final   Total Protein  Date Value Ref Range Status  03/07/2022 7.4 6.5 - 8.1 g/dL Final   GFR, Est African American  Date Value Ref Range Status  10/09/2017 79 > OR = 60 mL/min/1.67m Final   GFR calc Af Amer  Date Value Ref Range Status  11/20/2018 >60 >60 mL/min Final   GFR  Date Value Ref Range Status  02/20/2017 76.18 >60.00 mL/min Final   eGFR  Date Value Ref Range Status  01/31/2022 77 > OR = 60 mL/min/1.771mFinal   GFR, Est Non African  American  Date Value Ref Range Status  10/09/2017 69 > OR = 60 mL/min/1.78m Final   GFR, Estimated  Date  Value Ref Range Status  03/07/2022 >60 >60 mL/min Final    Comment:    (NOTE) Calculated using the CKD-EPI Creatinine Equation (2021)

## 2022-04-25 MED ORDER — QUETIAPINE FUMARATE 200 MG PO TABS: 200.0000 mg | ORAL_TABLET | Freq: Every day | ORAL | 0 refills | Status: DC

## 2022-04-25 NOTE — Telephone Encounter (Signed)
Requested medications are due for refill today.  yes  Requested medications are on the active medications list.  yes  Last refill. 12/19/2021 #90 0 rf  Future visit scheduled.   yes  Notes to clinic.  Refill not delegated.    Requested Prescriptions  Pending Prescriptions Disp Refills   QUEtiapine (SEROQUEL) 200 MG tablet 90 tablet 0     Not Delegated - Psychiatry:  Antipsychotics - Second Generation (Atypical) - quetiapine Failed - 04/24/2022 12:05 PM      Failed - This refill cannot be delegated      Failed - TSH in normal range and within 360 days    TSH  Date Value Ref Range Status  04/23/2021 1.21 0.40 - 4.50 mIU/L Final         Failed - Lipid Panel in normal range within the last 12 months    Cholesterol  Date Value Ref Range Status  10/29/2021 268 (H) <200 mg/dL Final   LDL Cholesterol (Calc)  Date Value Ref Range Status  10/29/2021 181 (H) mg/dL (calc) Final    Comment:    Reference range: <100 . Desirable range <100 mg/dL for primary prevention;   <70 mg/dL for patients with CHD or diabetic patients  with > or = 2 CHD risk factors. Marland Kitchen LDL-C is now calculated using the Martin-Hopkins  calculation, which is a validated novel method providing  better accuracy than the Friedewald equation in the  estimation of LDL-C.  [name] et al. Annamaria Helling. 3300;762(26): 2061-2068  (http://education.[facility].com/faq/FAQ164)    HDL  Date Value Ref Range Status  10/29/2021 47 (L) > OR = 50 mg/dL Final   Triglycerides  Date Value Ref Range Status  10/29/2021 212 (H) <150 mg/dL Final    Comment:    . If a non-fasting specimen was collected, consider repeat triglyceride testing on a fasting specimen if clinically indicated.  [name] et al. J. of Clin. Lipidol. 3335;4:562-563. .          Failed - CBC within normal limits and completed in the last 12 months    WBC  Date Value Ref Range Status  01/31/2022 8.3 3.8 - 10.8 Thousand/uL Final   RBC  Date Value  Ref Range Status  01/31/2022 4.79 3.80 - 5.10 Million/uL Final   Hemoglobin  Date Value Ref Range Status  01/31/2022 13.7 11.7 - 15.5 g/dL Final   HCT  Date Value Ref Range Status  01/31/2022 41.6 35.0 - 45.0 % Final   MCHC  Date Value Ref Range Status  01/31/2022 32.9 32.0 - 36.0 g/dL Final   [facility]  Date Value Ref Range Status  01/31/2022 28.6 27.0 - 33.0 pg Final   MCV  Date Value Ref Range Status  01/31/2022 86.8 80.0 - 100.0 fL Final   No results found for: "PLTCOUNTKUC", "LABPLAT", "POCPLA" RDW  Date Value Ref Range Status  01/31/2022 13.0 11.0 - 15.0 % Final         Passed - Completed PHQ-2 or PHQ-9 in the last 360 days      Passed - Last BP in normal range    BP Readings from Last 1 Encounters:  04/08/22 134/82         Passed - Last Heart Rate in normal range    Pulse Readings from Last 1 Encounters:  04/08/22 71         Passed - Valid encounter within last 6 months    Recent Outpatient Visits  2 weeks ago Exposure to the flu   [facility] [place], [name], NP   2 months ago [facility] [place], [name], NP   2 months ago Fever of unknown origin   [facility] [place], [name], NP   3 months ago History of revision of total replacement of right hip joint   [facility] [place], [name], NP   5 months ago Screening for colon cancer   [facility] [place], [name], NP       Future Appointments             In 6 days Garnette Gunner, [name], NP [facility] [place] within normal limits and completed in the last 12 months    Albumin  Date Value Ref Range Status  03/07/2022 4.1 3.5 - 5.0 g/dL Final   Alkaline Phosphatase  Date Value Ref Range Status  03/07/2022 103 38 - 126 U/L Final   Alkaline phosphatase (APISO)  Date Value Ref Range Status  01/31/2022 134 37 - 153 U/L Final   ALT  Date Value Ref Range Status   03/07/2022 28 0 - 44 U/L Final   AST  Date Value Ref Range Status  03/07/2022 31 15 - 41 U/L Final   BUN  Date Value Ref Range Status  03/07/2022 12 8 - 23 mg/dL Final   Calcium  Date Value Ref Range Status  03/07/2022 8.9 8.9 - 10.3 mg/dL Final   CO2  Date Value Ref Range Status  03/07/2022 23 22 - 32 mmol/L Final   Creat  Date Value Ref Range Status  01/31/2022 0.84 0.50 - 1.05 mg/dL Final   Creatinine, Ser  Date Value Ref Range Status  03/07/2022 1.01 (H) 0.44 - 1.00 mg/dL Final   Glucose, Bld  Date Value Ref Range Status  03/07/2022 167 (H) 70 - 99 mg/dL Final    Comment:    Glucose reference range applies only to samples taken after fasting for at least 8 hours.   Potassium  Date Value Ref Range Status  03/07/2022 4.0 3.5 - 5.1 mmol/L Final    Comment:    HEMOLYSIS AT THIS LEVEL MAY AFFECT RESULT   Sodium  Date Value Ref Range Status  03/07/2022 138 135 - 145 mmol/L Final   Total Bilirubin  Date Value Ref Range Status  03/07/2022 0.6 0.3 - 1.2 mg/dL Final   Protein, ur  Date Value Ref Range Status  12/14/2021 NEGATIVE NEGATIVE mg/dL Final   Protein, UA  Date Value Ref Range Status  01/31/2022 Negative Negative Final   Total Protein  Date Value Ref Range Status  03/07/2022 7.4 6.5 - 8.1 g/dL Final   GFR, Est African American  Date Value Ref Range Status  10/09/2017 79 > OR = 60 mL/min/1.83m Final   GFR calc Af Amer  Date Value Ref Range Status  11/20/2018 >60 >60 mL/min Final   GFR  Date Value Ref Range Status  02/20/2017 76.18 >60.00 mL/min Final   eGFR  Date Value Ref Range Status  01/31/2022 77 > OR = 60 mL/min/1.734mFinal   GFR, Est Non African American  Date Value Ref Range Status  10/09/2017 69 > OR = 60 mL/min/1.7331minal   GFR, Estimated  Date Value Ref Range Status  03/07/2022 >60 >60 mL/min Final    Comment:    (  NOTE) Calculated using the CKD-EPI Creatinine Equation (2021)

## 2022-04-25 NOTE — Telephone Encounter (Signed)
Requested medication (s) are due for refill today - no  Requested medication (s) are on the active medication list -yes  Future visit scheduled -yes  Last refill: 04/25/22 #90  Notes to clinic: non delegated Rx, duplicate Rx  Requested Prescriptions  Pending Prescriptions Disp Refills   QUEtiapine (SEROQUEL) 200 MG tablet 90 tablet 0     Not Delegated - Psychiatry:  Antipsychotics - Second Generation (Atypical) - quetiapine Failed - 04/24/2022  5:08 PM      Failed - This refill cannot be delegated      Failed - TSH in normal range and within 360 days    TSH  Date Value Ref Range Status  04/23/2021 1.21 0.40 - 4.50 mIU/L Final         Failed - Lipid Panel in normal range within the last 12 months    Cholesterol  Date Value Ref Range Status  10/29/2021 268 (H) <200 mg/dL Final   LDL Cholesterol (Calc)  Date Value Ref Range Status  10/29/2021 181 (H) mg/dL (calc) Final    Comment:    Reference range: <100 . Desirable range <100 mg/dL for primary prevention;   <70 mg/dL for patients with CHD or diabetic patients  with > or = 2 CHD risk factors. Marland Kitchen LDL-C is now calculated using the Martin-Hopkins  calculation, which is a validated novel method providing  better accuracy than the Friedewald equation in the  estimation of LDL-C.  [name] et al. Annamaria Helling. 6384;536(46): 2061-2068  (http://education.[facility].com/faq/FAQ164)    HDL  Date Value Ref Range Status  10/29/2021 47 (L) > OR = 50 mg/dL Final   Triglycerides  Date Value Ref Range Status  10/29/2021 212 (H) <150 mg/dL Final    Comment:    . If a non-fasting specimen was collected, consider repeat triglyceride testing on a fasting specimen if clinically indicated.  [name] et al. J. of Clin. Lipidol. 8032;1:224-825. .          Failed - CBC within normal limits and completed in the last 12 months    WBC  Date Value Ref Range Status  01/31/2022 8.3 3.8 - 10.8 Thousand/uL Final   RBC  Date Value  Ref Range Status  01/31/2022 4.79 3.80 - 5.10 Million/uL Final   Hemoglobin  Date Value Ref Range Status  01/31/2022 13.7 11.7 - 15.5 g/dL Final   HCT  Date Value Ref Range Status  01/31/2022 41.6 35.0 - 45.0 % Final   MCHC  Date Value Ref Range Status  01/31/2022 32.9 32.0 - 36.0 g/dL Final   [facility]  Date Value Ref Range Status  01/31/2022 28.6 27.0 - 33.0 pg Final   MCV  Date Value Ref Range Status  01/31/2022 86.8 80.0 - 100.0 fL Final   No results found for: "PLTCOUNTKUC", "LABPLAT", "POCPLA" RDW  Date Value Ref Range Status  01/31/2022 13.0 11.0 - 15.0 % Final         Passed - Completed PHQ-2 or PHQ-9 in the last 360 days      Passed - Last BP in normal range    BP Readings from Last 1 Encounters:  04/08/22 134/82         Passed - Last Heart Rate in normal range    Pulse Readings from Last 1 Encounters:  04/08/22 71         Passed - Valid encounter within last 6 months    Recent Outpatient Visits           2  weeks ago Exposure to the flu   [facility] [place], [name], NP   2 months ago [facility] [place], [name], NP   2 months ago Fever of unknown origin   [facility] [place], [name], NP   3 months ago History of revision of total replacement of right hip joint   [facility] [place], [name], NP   5 months ago Screening for colon cancer   [facility] [place], [name], NP       Future Appointments             In 6 days Garnette Gunner, [name], NP [facility] [place] within normal limits and completed in the last 12 months    Albumin  Date Value Ref Range Status  03/07/2022 4.1 3.5 - 5.0 g/dL Final   Alkaline Phosphatase  Date Value Ref Range Status  03/07/2022 103 38 - 126 U/L Final   Alkaline phosphatase (APISO)  Date Value Ref Range Status  01/31/2022 134 37 - 153 U/L Final   ALT  Date Value Ref Range Status   03/07/2022 28 0 - 44 U/L Final   AST  Date Value Ref Range Status  03/07/2022 31 15 - 41 U/L Final   BUN  Date Value Ref Range Status  03/07/2022 12 8 - 23 mg/dL Final   Calcium  Date Value Ref Range Status  03/07/2022 8.9 8.9 - 10.3 mg/dL Final   CO2  Date Value Ref Range Status  03/07/2022 23 22 - 32 mmol/L Final   Creat  Date Value Ref Range Status  01/31/2022 0.84 0.50 - 1.05 mg/dL Final   Creatinine, Ser  Date Value Ref Range Status  03/07/2022 1.01 (H) 0.44 - 1.00 mg/dL Final   Glucose, Bld  Date Value Ref Range Status  03/07/2022 167 (H) 70 - 99 mg/dL Final    Comment:    Glucose reference range applies only to samples taken after fasting for at least 8 hours.   Potassium  Date Value Ref Range Status  03/07/2022 4.0 3.5 - 5.1 mmol/L Final    Comment:    HEMOLYSIS AT THIS LEVEL MAY AFFECT RESULT   Sodium  Date Value Ref Range Status  03/07/2022 138 135 - 145 mmol/L Final   Total Bilirubin  Date Value Ref Range Status  03/07/2022 0.6 0.3 - 1.2 mg/dL Final   Protein, ur  Date Value Ref Range Status  12/14/2021 NEGATIVE NEGATIVE mg/dL Final   Protein, UA  Date Value Ref Range Status  01/31/2022 Negative Negative Final   Total Protein  Date Value Ref Range Status  03/07/2022 7.4 6.5 - 8.1 g/dL Final   GFR, Est African American  Date Value Ref Range Status  10/09/2017 79 > OR = 60 mL/min/1.4m Final   GFR calc Af Amer  Date Value Ref Range Status  11/20/2018 >60 >60 mL/min Final   GFR  Date Value Ref Range Status  02/20/2017 76.18 >60.00 mL/min Final   eGFR  Date Value Ref Range Status  01/31/2022 77 > OR = 60 mL/min/1.766mFinal   GFR, Est Non African American  Date Value Ref Range Status  10/09/2017 69 > OR = 60 mL/min/1.7346minal   GFR, Estimated  Date Value Ref Range Status  03/07/2022 >60 >60 mL/min Final    Comment:    (  NOTE) Calculated using the CKD-EPI Creatinine Equation (2021)             Requested  Prescriptions  Pending Prescriptions Disp Refills   QUEtiapine (SEROQUEL) 200 MG tablet 90 tablet 0     Not Delegated - Psychiatry:  Antipsychotics - Second Generation (Atypical) - quetiapine Failed - 04/24/2022  5:08 PM      Failed - This refill cannot be delegated      Failed - TSH in normal range and within 360 days    TSH  Date Value Ref Range Status  04/23/2021 1.21 0.40 - 4.50 mIU/L Final         Failed - Lipid Panel in normal range within the last 12 months    Cholesterol  Date Value Ref Range Status  10/29/2021 268 (H) <200 mg/dL Final   LDL Cholesterol (Calc)  Date Value Ref Range Status  10/29/2021 181 (H) mg/dL (calc) Final    Comment:    Reference range: <100 . Desirable range <100 mg/dL for primary prevention;   <70 mg/dL for patients with CHD or diabetic patients  with > or = 2 CHD risk factors. Marland Kitchen LDL-C is now calculated using the Martin-Hopkins  calculation, which is a validated novel method providing  better accuracy than the Friedewald equation in the  estimation of LDL-C.  [name] et al. Annamaria Helling. 2353;614(43): 2061-2068  (http://education.[facility].com/faq/FAQ164)    HDL  Date Value Ref Range Status  10/29/2021 47 (L) > OR = 50 mg/dL Final   Triglycerides  Date Value Ref Range Status  10/29/2021 212 (H) <150 mg/dL Final    Comment:    . If a non-fasting specimen was collected, consider repeat triglyceride testing on a fasting specimen if clinically indicated.  [name] et al. J. of Clin. Lipidol. 1540;0:867-619. .          Failed - CBC within normal limits and completed in the last 12 months    WBC  Date Value Ref Range Status  01/31/2022 8.3 3.8 - 10.8 Thousand/uL Final   RBC  Date Value Ref Range Status  01/31/2022 4.79 3.80 - 5.10 Million/uL Final   Hemoglobin  Date Value Ref Range Status  01/31/2022 13.7 11.7 - 15.5 g/dL Final   HCT  Date Value Ref Range Status  01/31/2022 41.6 35.0 - 45.0 % Final   MCHC  Date Value  Ref Range Status  01/31/2022 32.9 32.0 - 36.0 g/dL Final   [facility]  Date Value Ref Range Status  01/31/2022 28.6 27.0 - 33.0 pg Final   MCV  Date Value Ref Range Status  01/31/2022 86.8 80.0 - 100.0 fL Final   No results found for: "PLTCOUNTKUC", "LABPLAT", "POCPLA" RDW  Date Value Ref Range Status  01/31/2022 13.0 11.0 - 15.0 % Final         Passed - Completed PHQ-2 or PHQ-9 in the last 360 days      Passed - Last BP in normal range    BP Readings from Last 1 Encounters:  04/08/22 134/82         Passed - Last Heart Rate in normal range    Pulse Readings from Last 1 Encounters:  04/08/22 71         Passed - Valid encounter within last 6 months    Recent Outpatient Visits           2 weeks ago Exposure to the flu   [place], [name], NP   2 months  ago [facility] [place], [name], NP   2 months ago Fever of unknown origin   [facility] [place], [name], NP   3 months ago History of revision of total replacement of right hip joint   [facility] [place], [name], NP   5 months ago Screening for colon cancer   [facility] [place], [name], NP       Future Appointments             In 6 days [place], [name], NP [facility] [place] within normal limits and completed in the last 12 months    Albumin  Date Value Ref Range Status  03/07/2022 4.1 3.5 - 5.0 g/dL Final   Alkaline Phosphatase  Date Value Ref Range Status  03/07/2022 103 38 - 126 U/L Final   Alkaline phosphatase (APISO)  Date Value Ref Range Status  01/31/2022 134 37 - 153 U/L Final   ALT  Date Value Ref Range Status  03/07/2022 28 0 - 44 U/L Final   AST  Date Value Ref Range Status  03/07/2022 31 15 - 41 U/L Final   BUN  Date Value Ref Range Status  03/07/2022 12 8 - 23 mg/dL Final   Calcium  Date Value Ref Range Status  03/07/2022 8.9 8.9 - 10.3 mg/dL  Final   CO2  Date Value Ref Range Status  03/07/2022 23 22 - 32 mmol/L Final   Creat  Date Value Ref Range Status  01/31/2022 0.84 0.50 - 1.05 mg/dL Final   Creatinine, Ser  Date Value Ref Range Status  03/07/2022 1.01 (H) 0.44 - 1.00 mg/dL Final   Glucose, Bld  Date Value Ref Range Status  03/07/2022 167 (H) 70 - 99 mg/dL Final    Comment:    Glucose reference range applies only to samples taken after fasting for at least 8 hours.   Potassium  Date Value Ref Range Status  03/07/2022 4.0 3.5 - 5.1 mmol/L Final    Comment:    HEMOLYSIS AT THIS LEVEL MAY AFFECT RESULT   Sodium  Date Value Ref Range Status  03/07/2022 138 135 - 145 mmol/L Final   Total Bilirubin  Date Value Ref Range Status  03/07/2022 0.6 0.3 - 1.2 mg/dL Final   Protein, ur  Date Value Ref Range Status  12/14/2021 NEGATIVE NEGATIVE mg/dL Final   Protein, UA  Date Value Ref Range Status  01/31/2022 Negative Negative Final   Total Protein  Date Value Ref Range Status  03/07/2022 7.4 6.5 - 8.1 g/dL Final   GFR, Est African American  Date Value Ref Range Status  10/09/2017 79 > OR = 60 mL/min/1.42m Final   GFR calc Af Amer  Date Value Ref Range Status  11/20/2018 >60 >60 mL/min Final   GFR  Date Value Ref Range Status  02/20/2017 76.18 >60.00 mL/min Final   eGFR  Date Value Ref Range Status  01/31/2022 77 > OR = 60 mL/min/1.752mFinal   GFR, Est Non African American  Date Value Ref Range Status  10/09/2017 69 > OR = 60 mL/min/1.7319minal   GFR, Estimated  Date Value Ref Range Status  03/07/2022 >60 >60 mL/min Final    Comment:    (NOTE) Calculated using the CKD-EPI Creatinine Equation (2021)

## 2022-05-01 ENCOUNTER — Ambulatory Visit (INDEPENDENT_AMBULATORY_CARE_PROVIDER_SITE_OTHER): Payer: Medicare HMO | Admitting: Internal Medicine

## 2022-05-01 ENCOUNTER — Encounter: Payer: Self-pay | Admitting: Internal Medicine

## 2022-05-01 VITALS — BP 143/76 | HR 94 | Temp 98.4°F | Resp 17 | Wt 209.8 lb

## 2022-05-01 DIAGNOSIS — G8929 Other chronic pain: Secondary | ICD-10-CM

## 2022-05-01 DIAGNOSIS — E6609 Other obesity due to excess calories: Secondary | ICD-10-CM

## 2022-05-01 DIAGNOSIS — K582 Mixed irritable bowel syndrome: Secondary | ICD-10-CM

## 2022-05-01 DIAGNOSIS — F419 Anxiety disorder, unspecified: Secondary | ICD-10-CM

## 2022-05-01 DIAGNOSIS — M1612 Unilateral primary osteoarthritis, left hip: Secondary | ICD-10-CM | POA: Diagnosis not present

## 2022-05-01 DIAGNOSIS — M5442 Lumbago with sciatica, left side: Secondary | ICD-10-CM | POA: Diagnosis not present

## 2022-05-01 DIAGNOSIS — K219 Gastro-esophageal reflux disease without esophagitis: Secondary | ICD-10-CM

## 2022-05-01 DIAGNOSIS — E782 Mixed hyperlipidemia: Secondary | ICD-10-CM

## 2022-05-01 DIAGNOSIS — L4050 Arthropathic psoriasis, unspecified: Secondary | ICD-10-CM | POA: Diagnosis not present

## 2022-05-01 DIAGNOSIS — J4541 Moderate persistent asthma with (acute) exacerbation: Secondary | ICD-10-CM

## 2022-05-01 DIAGNOSIS — I10 Essential (primary) hypertension: Secondary | ICD-10-CM

## 2022-05-01 DIAGNOSIS — F5104 Psychophysiologic insomnia: Secondary | ICD-10-CM

## 2022-05-01 DIAGNOSIS — Z6833 Body mass index (BMI) 33.0-33.9, adult: Secondary | ICD-10-CM

## 2022-05-01 DIAGNOSIS — F331 Major depressive disorder, recurrent, moderate: Secondary | ICD-10-CM

## 2022-05-01 MED ORDER — PAROXETINE HCL ER 37.5 MG PO TB24: 75.0000 mg | ORAL_TABLET | Freq: Every day | ORAL | 1 refills | Status: DC

## 2022-05-01 MED ORDER — TRIAMCINOLONE ACETONIDE 0.1 % EX CREA: 1.0000 | TOPICAL_CREAM | Freq: Two times a day (BID) | CUTANEOUS | 0 refills | Status: DC

## 2022-05-01 MED ORDER — MUPIROCIN 2 % EX OINT: 1.0000 | TOPICAL_OINTMENT | Freq: Two times a day (BID) | CUTANEOUS | 0 refills | Status: DC

## 2022-05-01 NOTE — Assessment & Plan Note (Signed)
Uncontrolled but she did take her verapamil today Reinforced DASH diet and exercise for weight loss C-Met today

## 2022-05-01 NOTE — Assessment & Plan Note (Signed)
Will increase paroxetine to 75 mg daily Continue Seroquel Support offered

## 2022-05-01 NOTE — Assessment & Plan Note (Signed)
Continue Singulair and albuterol

## 2022-05-01 NOTE — Assessment & Plan Note (Signed)
Continue trazodone 

## 2022-05-01 NOTE — Assessment & Plan Note (Signed)
C-Met and lipid profile today Encouraged her to consume a low-fat diet 

## 2022-05-01 NOTE — Assessment & Plan Note (Signed)
Not currently being treated We will monitor

## 2022-05-01 NOTE — Patient Instructions (Signed)

## 2022-05-01 NOTE — Assessment & Plan Note (Signed)
Encourage diet and exercise for weight loss 

## 2022-05-01 NOTE — Progress Notes (Signed)
Subjective:    Patient ID: Michele Newton, female    DOB: 1956/04/28, 66 y.o.   MRN: [id number]  HPI  Patient presents to clinic today for 75-monthfollow-up of chronic conditions.  HTN: Her BP today is 143/76.  She is taking Verapamil as prescribed but she admits she did not take her blood pressure medication today.  ECG from 03/2022 reviewed.  GERD: She is not sure what triggers this.  She denies breakthrough on Pantoprazole.  There is no upper GI on file.  HLD: Her last LDL was 181, triglycerides 87, 10/2021.  She is not taking any cholesterol-lowering medication at this time.  She does not consume a low-fat diet.  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  She is not taking any medication for this.  She does not follow with rheumatology.  OA: Mainly in her back and hips.  She takes Tramadol as needed with good relief of symptoms.  Anxiety and Depression: Chronic, managed on Paroxetine and Seroquel.  She is not currently seeing a therapist or psychiatrist.  She denies SI/HI.  Insomnia: She has difficulty falling asleep.  She takes Trazodone and Seroquel as prescribed with good relief of symptoms.  There is no sleep study on file.  Asthma: She reports chronic cough and shortness of breath.  She is taking Singulair and Albuterol as prescribed.  There are no PFTs on file.  She does not follow with pulmonology.  IBS: Alternating constipation and diarrhea.  She is not taking any medications for this.  There is no colonoscopy on file.  Review of Systems     Past Medical History:  Diagnosis Date   Anxiety    Arthritis    Asthma    Depression    Frequent headaches    GERD (gastroesophageal reflux disease)    Hydrocephalus (HCC) 1995   Hypertension    Migraine    PONV (postoperative nausea and vomiting)    Psoriatic arthritis (HCC)     Current Outpatient Medications  Medication Sig Dispense Refill   albuterol (PROVENTIL) (2.5 MG/3ML) 0.083% nebulizer solution Take 3  mLs (2.5 mg total) by nebulization every 4 (four) hours as needed for wheezing or shortness of breath. 75 mL 0   budesonide-formoterol (SYMBICORT) 160-4.5 MCG/ACT inhaler Inhale 2 puffs into the lungs 2 (two) times daily. (Patient not taking: Reported on 01/31/2022) 1 each 3   chlorpheniramine-HYDROcodone (TUSSIONEX) 10-8 MG/5ML Take 5 mLs by mouth every 12 (twelve) hours as needed for cough. 75 mL 0   docusate sodium (COLACE) 100 MG capsule Take 1 capsule (100 mg total) by mouth 2 (two) times daily. 10 capsule 0   fluticasone (FLONASE) 50 MCG/ACT nasal spray Place 2 sprays into both nostrils daily. Use for 4-6 weeks then stop and use seasonally or as needed. 16 g 3   methocarbamol (ROBAXIN) 500 MG tablet Take 1 tablet by mouth every 6 (six) hours as needed.     montelukast (SINGULAIR) 10 MG tablet TAKE 1 TABLET(10 MG) BY MOUTH AT BEDTIME 90 tablet 0   Nebulizers (COMPRESSOR/NEBULIZER) MISC 1 Units by Does not apply route every 4 (four) hours as needed. 1 each 0   oseltamivir (TAMIFLU) 75 MG capsule Take 1 capsule (75 mg total) by mouth daily. For 10 days. If develop symptoms change dosing to twice daily to finish course 10 capsule 0   pantoprazole (PROTONIX) 40 MG tablet TAKE 1 TABLET(40 MG) BY MOUTH TWICE DAILY 180 tablet 3   PARoxetine (PAXIL) 40 MG tablet  TAKE 1 TABLET(40 MG) BY MOUTH AT BEDTIME 90 tablet 0   QUEtiapine (SEROQUEL) 200 MG tablet Take 1 tablet (200 mg total) by mouth at bedtime. 90 tablet 0   traMADol (ULTRAM) 50 MG tablet Take 1 tablet (50 mg total) by mouth every 6 (six) hours as needed. 30 tablet 0   traZODone (DESYREL) 50 MG tablet TAKE 1/2 TO 1 TABLET(25 TO 50 MG) BY MOUTH AT BEDTIME AS NEEDED FOR SLEEP 90 tablet 0   verapamil (CALAN-SR) 180 MG CR tablet TAKE 1 TABLET(180 MG) BY MOUTH AT BEDTIME 90 tablet 0   No current facility-administered medications for this visit.    Allergies  Allergen Reactions   Amitriptyline Other (See Comments)    hallucinations   Effexor  [Venlafaxine] Other (See Comments)    Amnesia, hospitalized x 2 weeks   Haldol [Haloperidol] Other (See Comments)    Paralyzed vocal cords     Family History  Problem Relation Age of Onset   Arthritis Mother    Hyperlipidemia Mother    Heart disease Mother 92       Died of heart failure   Hypertension Mother    Diabetes Mother    Depression Mother    Arthritis Father    Cancer Father        prostate cancer   Diabetes Brother    Breast cancer Maternal Grandmother    Colon cancer Neg Hx     Social History   Socioeconomic History   Marital status: Married    Spouse name: Michele Newton   Number of children: 2   Years of education: 14   Highest education level: Not on file  Occupational History    Employer: OTHER  Tobacco Use   Smoking status: Never   Smokeless tobacco: Never  Vaping Use   Vaping Use: Never used  Substance and Sexual Activity   Alcohol use: No   Drug use: No   Sexual activity: Not Currently    Birth control/protection: Surgical  Other Topics Concern   Not on file  Social History Narrative   Michele Newton lives at home with her husband, Michele Newton, of 5 months and a cat named, Puff. She has 2 sons from a previous marriage. She has 4 grandchildren.    Social Determinants of Health   Financial Resource Strain: Low Risk  (09/14/2021)   Overall Financial Resource Strain (CARDIA)    Difficulty of Paying Living Expenses: Not hard at all  Food Insecurity: No Food Insecurity (09/14/2021)   Hunger Vital Sign    Worried About Running Out of Food in the Last Year: Never true    Ran Out of Food in the Last Year: Never true  Transportation Needs: No Transportation Needs (09/14/2021)   PRAPARE - Hydrologist (Medical): No    Lack of Transportation (Non-Medical): No  Physical Activity: Insufficiently Active (09/14/2021)   Exercise Vital Sign    Days of Exercise per Week: 2 days    Minutes of Exercise per Session: 20 min  Stress: No Stress Concern Present  (09/14/2021)   [place]    Feeling of Stress : Only a little  Social Connections: Moderately Isolated (09/14/2021)   Social Connection and Isolation Panel [NHANES]    Frequency of Communication with Friends and Family: Three times a week    Frequency of Social Gatherings with Friends and Family: Once a week    Attends Religious Services: Never  Active Member of Clubs or Organizations: No    Attends Archivist Meetings: Never    Marital Status: Married  Human resources officer Violence: Not At Risk (09/14/2021)   Humiliation, Afraid, Rape, and Kick questionnaire    Fear of Current or Ex-Partner: No    Emotionally Abused: No    Physically Abused: No    Sexually Abused: No     Constitutional: Denies fever, malaise, fatigue, headache or abrupt weight changes.  HEENT: Denies eye pain, eye redness, ear pain, ringing in the ears, wax buildup, runny nose, nasal congestion, bloody nose, or sore throat. Respiratory: Patient reports chronic cough and shortness of breath.  Denies difficulty breathing, or sputum production.   Cardiovascular: Denies chest pain, chest tightness, palpitations or swelling in the hands or feet.  Gastrointestinal: Denies abdominal pain, bloating, constipation, diarrhea or blood in the stool.  GU: Denies urgency, frequency, pain with urination, burning sensation, blood in urine, odor or discharge. Musculoskeletal: Patient reports joint pain.  Denies decrease in range of motion, difficulty with gait, muscle pain or joint swelling.  Skin: Pt reports rash of abdomen. Denies lesions or ulcercations.  Neurological: Patient reports insomnia.  Denies dizziness, difficulty with memory, difficulty with speech or problems with balance and coordination.  Psych: Patient has a history of anxiety and depression.  Denies SI/HI.  No other specific complaints in a complete review of systems (except as listed in HPI  above).  Objective:   Physical Exam   BP (!) 143/76   Pulse 94   Temp 98.4 F (36.9 C) (Oral)   Resp 17   Wt 209 lb 12.8 oz (95.2 kg)   BMI 33.86 kg/m   Wt Readings from Last 3 Encounters:  04/08/22 205 lb (93 kg)  03/07/22 207 lb 3.7 oz (94 kg)  01/31/22 208 lb (94.3 kg)    General: Appears her stated age, obese, in NAD. Skin: Warm, dry and intact. Scattered scabbed lesions noted on the left side of the abdomen.  HEENT: Head: normal shape and size; Eyes: sclera white, no icterus, conjunctiva pink, PERRLA and EOMs intact;  Cardiovascular: Normal rate and rhythm. S1,S2 noted.  No murmur, rubs or gallops noted. No JVD or BLE edema. No carotid bruits noted. Pulmonary/Chest: Normal effort and positive vesicular breath sounds. No respiratory distress. No wheezes, rales or ronchi noted.  Abdomen: Normal bowel sounds.  Musculoskeletal:  No difficulty with gait.  Neurological: Alert and oriented. Cranial nerves II-XII grossly intact. Coordination normal.  Psychiatric: Mood and affect flat. Behavior is normal. Judgment and thought content normal.     BMET    Component Value Date/Time   NA 138 03/07/2022 1853   K 4.0 03/07/2022 1853   CL 104 03/07/2022 1853   CO2 23 03/07/2022 1853   GLUCOSE 167 (H) 03/07/2022 1853   BUN 12 03/07/2022 1853   CREATININE 1.01 (H) 03/07/2022 1853   CREATININE 0.84 01/31/2022 0859   CALCIUM 8.9 03/07/2022 1853   GFRNONAA >60 03/07/2022 1853   GFRNONAA 69 10/09/2017 1024   GFRAA >60 11/20/2018 0408   GFRAA 79 10/09/2017 1024    Lipid Panel     Component Value Date/Time   CHOL 268 (H) 10/29/2021 0947   TRIG 212 (H) 10/29/2021 0947   HDL 47 (L) 10/29/2021 0947   CHOLHDL 5.7 (H) 10/29/2021 0947   VLDL 33.0 02/20/2017 0841   LDLCALC 181 (H) 10/29/2021 0947    CBC    Component Value Date/Time   WBC 8.3 01/31/2022 0859  RBC 4.79 01/31/2022 0859   HGB 13.7 01/31/2022 0859   HCT 41.6 01/31/2022 0859   PLT 400 01/31/2022 0859   MCV 86.8  01/31/2022 0859   MCH 28.6 01/31/2022 0859   MCHC 32.9 01/31/2022 0859   RDW 13.0 01/31/2022 0859   LYMPHSABS 2.2 07/18/2020 1054   MONOABS 0.8 07/18/2020 1054   EOSABS 0.1 07/18/2020 1054   BASOSABS 0.1 07/18/2020 1054    Hgb A1C Lab Results  Component Value Date   HGBA1C 5.5 10/29/2021           Assessment & Plan:   Rash of Abdomen:  RX for Triamcinolone cream 0.1% BID prn RX for Mupiricon 2% cream BID prn  RTC in 6 months for your annual exam [name], NP

## 2022-05-01 NOTE — Assessment & Plan Note (Signed)
Encouraged high fiber diet and adequate water intake 

## 2022-05-01 NOTE — Assessment & Plan Note (Signed)
Try to identify and avoid foods that trigger reflux Encourage weight loss as this can produce reflux symptoms Continue pantoprazole 

## 2022-05-01 NOTE — Assessment & Plan Note (Signed)
Encourage weight loss as this can produce joint pain Continue tramadol

## 2022-05-01 NOTE — Assessment & Plan Note (Signed)
Encourage regular stretching and core strengthening Continue tramadol

## 2022-05-02 ENCOUNTER — Encounter: Payer: Self-pay | Admitting: Internal Medicine

## 2022-05-02 LAB — COMPLETE METABOLIC PANEL WITH GFR
AG Ratio: 1.4 (calc) (ref 1.0–2.5)
ALT: 30 U/L — ABNORMAL HIGH (ref 6–29)
AST: 22 U/L (ref 10–35)
Albumin: 4 g/dL (ref 3.6–5.1)
Alkaline phosphatase (APISO): 108 U/L (ref 37–153)
BUN: 12 mg/dL (ref 7–25)
CO2: 25 mmol/L (ref 20–32)
Calcium: 9.3 mg/dL (ref 8.6–10.4)
Chloride: 104 mmol/L (ref 98–110)
Creat: 0.85 mg/dL (ref 0.50–1.05)
Globulin: 2.9 g/dL (calc) (ref 1.9–3.7)
Glucose, Bld: 164 mg/dL — ABNORMAL HIGH (ref 65–99)
Potassium: 4.1 mmol/L (ref 3.5–5.3)
Sodium: 140 mmol/L (ref 135–146)
Total Bilirubin: 0.3 mg/dL (ref 0.2–1.2)
Total Protein: 6.9 g/dL (ref 6.1–8.1)
eGFR: 75 mL/min/{1.73_m2} (ref >=60)

## 2022-05-02 LAB — LIPID PANEL
Cholesterol: 340 mg/dL — ABNORMAL HIGH (ref <200)
HDL: 45 mg/dL — ABNORMAL LOW (ref >=50)
LDL Cholesterol (Calc): 245 mg/dL (calc) — ABNORMAL HIGH
Non-HDL Cholesterol (Calc): 295 mg/dL (calc) — ABNORMAL HIGH (ref <130)
Total CHOL/HDL Ratio: 7.6 (calc) — ABNORMAL HIGH (ref <5.0)
Triglycerides: 295 mg/dL — ABNORMAL HIGH (ref <150)

## 2022-05-02 LAB — CBC
HCT: 44.4 % (ref 35.0–45.0)
Hemoglobin: 14.9 g/dL (ref 11.7–15.5)
MCH: 27.7 pg (ref 27.0–33.0)
MCHC: 33.6 g/dL (ref 32.0–36.0)
MCV: 82.7 fL (ref 80.0–100.0)
MPV: 9.4 fL (ref 7.5–12.5)
Platelets: 490 10*3/uL — ABNORMAL HIGH (ref 140–400)
RBC: 5.37 10*6/uL — ABNORMAL HIGH (ref 3.80–5.10)
RDW: 13.6 % (ref 11.0–15.0)
WBC: 7.1 10*3/uL (ref 3.8–10.8)

## 2022-05-03 MED ORDER — PAROXETINE HCL 40 MG PO TABS: 60.0000 mg | ORAL_TABLET | ORAL | 1 refills | Status: DC

## 2022-06-12 DIAGNOSIS — H1045 Other chronic allergic conjunctivitis: Secondary | ICD-10-CM | POA: Diagnosis not present

## 2022-06-12 DIAGNOSIS — H26493 Other secondary cataract, bilateral: Secondary | ICD-10-CM | POA: Diagnosis not present

## 2022-06-12 DIAGNOSIS — H5203 Hypermetropia, bilateral: Secondary | ICD-10-CM | POA: Diagnosis not present

## 2022-06-13 ENCOUNTER — Other Ambulatory Visit: Payer: Self-pay | Admitting: Internal Medicine

## 2022-06-13 DIAGNOSIS — J4520 Mild intermittent asthma, uncomplicated: Secondary | ICD-10-CM

## 2022-06-13 DIAGNOSIS — F5104 Psychophysiologic insomnia: Secondary | ICD-10-CM

## 2022-06-13 NOTE — Telephone Encounter (Signed)
Requested medication (s) are due for refill today: yes  Requested medication (s) are on the active medication list: yes  Last refill:  singular - 01/23/22 #90 0 refills, verampimil, trazadone- 03/20/22 #90 0 refills  Future visit scheduled: yes tomorrow  Notes to clinic:  no refills remain. Do you want to refill Rxs?     Requested Prescriptions  Pending Prescriptions Disp Refills   montelukast (SINGULAIR) 10 MG tablet [Pharmacy Med Name: MONTELUKAST '10MG'$  TABLETS] 90 tablet 0    Sig: TAKE 1 TABLET(10 MG) BY MOUTH AT BEDTIME     Pulmonology:  Leukotriene Inhibitors Passed - 06/13/2022  7:11 AM      Passed - Valid encounter within last 12 months    Recent Outpatient Visits           1 month ago Mixed hyperlipidemia   [facility] [place], [place], NP   2 months ago Exposure to the flu   [facility] [place], [name], NP   3 months ago [facility] [place], [name], NP   4 months ago Fever of unknown origin   [facility] [place], [name], NP   5 months ago History of revision of total replacement of right hip joint   [facility] [place], [name], NP       Future Appointments             Tomorrow Garnette Gunner, [name], NP [facility]             verapamil (CALAN-SR) 180 MG CR tablet [Pharmacy Med Name: VERAPAMIL ER '180MG'$  TABLETS] 90 tablet 0    Sig: TAKE 1 TABLET(180 MG) BY MOUTH AT BEDTIME     Cardiovascular: Calcium Channel Blockers 3 Failed - 06/13/2022  7:11 AM      Failed - ALT in normal range and within 360 days    ALT  Date Value Ref Range Status  05/01/2022 30 (H) 6 - 29 U/L Final         Failed - Last BP in normal range    BP Readings from Last 1 Encounters:  05/01/22 (!) 143/76         Passed - AST in normal range and within 360 days    AST  Date Value Ref Range Status  05/01/2022 22  10 - 35 U/L Final         Passed - Cr in normal range and within 360 days    Creat  Date Value Ref Range Status  05/01/2022 0.85 0.50 - 1.05 mg/dL Final         Passed - Last Heart Rate in normal range    Pulse Readings from Last 1 Encounters:  05/01/22 94         Passed - Valid encounter within last 6 months    Recent Outpatient Visits           1 month ago Mixed hyperlipidemia   [place], [name], NP   2 months ago Exposure to the flu   [facility] [place], [name], NP   3 months ago [facility] [place], [name], NP   4 months ago Fever of unknown origin   [facility] [place], [place],  NP   5 months ago History of revision of total replacement of right hip joint   [address], NP       Future Appointments             Tomorrow Garnette Gunner, [name], NP [facility], [place]             traZODone ([place]) 50 MG tablet [Pharmacy Med Name: TRAZODONE '50MG'$  TABLETS] 90 tablet 0    Sig: TAKE 1/2 TO 1 TABLET(25 TO 50 MG) BY MOUTH AT BEDTIME AS NEEDED FOR SLEEP     Psychiatry: Antidepressants - Serotonin Modulator Passed - 06/13/2022  7:11 AM      Passed - Completed PHQ-2 or PHQ-9 in the last 360 days      Passed - Valid encounter within last 6 months    Recent Outpatient Visits           1 month ago Mixed hyperlipidemia   [facility] [place], [place], [place]   2 months ago Exposure to the flu   [facility] [place], [name], NP   3 months ago [facility] [place], [name], NP   4 months ago Fever of unknown origin   [facility] [place], [place], NP   5 months ago History of revision of total replacement of right hip joint   [facility]  [place], [name], NP       Future Appointments             Tomorrow Garnette Gunner, [name], NP [facility], [facility]

## 2022-06-14 ENCOUNTER — Ambulatory Visit (INDEPENDENT_AMBULATORY_CARE_PROVIDER_SITE_OTHER): Payer: Medicare HMO | Admitting: Internal Medicine

## 2022-06-14 ENCOUNTER — Encounter: Payer: Self-pay | Admitting: Internal Medicine

## 2022-06-14 VITALS — BP 128/74 | HR 76 | Temp 96.4°F | Wt 212.0 lb

## 2022-06-14 DIAGNOSIS — R350 Frequency of micturition: Secondary | ICD-10-CM

## 2022-06-14 DIAGNOSIS — Z23 Encounter for immunization: Secondary | ICD-10-CM | POA: Diagnosis not present

## 2022-06-14 DIAGNOSIS — Z780 Asymptomatic menopausal state: Secondary | ICD-10-CM

## 2022-06-14 DIAGNOSIS — H6992 Unspecified Eustachian tube disorder, left ear: Secondary | ICD-10-CM | POA: Diagnosis not present

## 2022-06-14 DIAGNOSIS — R300 Dysuria: Secondary | ICD-10-CM

## 2022-06-14 LAB — POCT URINALYSIS DIPSTICK
Bilirubin, UA: NEGATIVE
Blood, UA: NEGATIVE
Glucose, UA: NEGATIVE
Ketones, UA: NEGATIVE
Leukocytes, UA: NEGATIVE
Nitrite, UA: NEGATIVE
Protein, UA: NEGATIVE
Spec Grav, UA: >=1.03 — AB (ref 1.010–1.025)
Urobilinogen, UA: 0.2 E.U./dL
pH, UA: 6 (ref 5.0–8.0)

## 2022-06-14 NOTE — Progress Notes (Signed)
HPI  Pt presents to the clinic today with c/o burning with urination. She reports this started 4 days. She reports urinary frequency but denies urgency, dysuria or had blood in her urine. She denies vaginal discharge, irritation, itching, abnormal vaginal bleeding. She reports fever up to 101.4, had chills but denies nausea and vomiting. She has tried AZO OTC with minimal improvement in symptoms.  She is postmenopausal.  She also reports left ear discomfort.  She reports this started 1 week ago.  She denies over ear pain, decreased hearing or drainage.  She has not tried anything OTC for this.   Review of Systems  Past Medical History:  Diagnosis Date   Anxiety    Arthritis    Asthma    Depression    Frequent headaches    GERD (gastroesophageal reflux disease)    Hydrocephalus (HCC) 1995   Hypertension    Migraine    PONV (postoperative nausea and vomiting)    Psoriatic arthritis ([place])     Family History  Problem Relation Age of Onset   Arthritis Mother    Hyperlipidemia Mother    Heart disease Mother 65       Died of heart failure   Hypertension Mother    Diabetes Mother    Depression Mother    Arthritis Father    Cancer Father        prostate cancer   Diabetes Brother    Breast cancer Maternal Grandmother    Colon cancer Neg Hx     Social History   Socioeconomic History   Marital status: Married    Spouse name: [name]   Number of children: 2   Years of education: 14   Highest education level: Not on file  Occupational History    Employer: OTHER  Tobacco Use   Smoking status: Never   Smokeless tobacco: Never  Vaping Use   Vaping Use: Never used  Substance and Sexual Activity   Alcohol use: No   Drug use: No   Sexual activity: Not Currently    Birth control/protection: Surgical  Other Topics Concern   Not on file  Social History Narrative   Laila lives at home with her husband, [name], of 5 months and a cat named, Puff. She has 2 sons from a previous  marriage. She has 4 grandchildren.    Social Determinants of Health   Financial Resource Strain: Low Risk  (09/14/2021)   Overall Financial Resource Strain (CARDIA)    Difficulty of Paying Living Expenses: Not hard at all  Food Insecurity: No Food Insecurity (09/14/2021)   Hunger Vital Sign    Worried About Running Out of Food in the Last Year: Never true    Ran Out of Food in the Last Year: Never true  Transportation Needs: No Transportation Needs (09/14/2021)   PRAPARE - Hydrologist (Medical): No    Lack of Transportation (Non-Medical): No  Physical Activity: Insufficiently Active (09/14/2021)   Exercise Vital Sign    Days of Exercise per Week: 2 days    Minutes of Exercise per Session: 20 min  Stress: No Stress Concern Present (09/14/2021)   [place]    Feeling of Stress : Only a little  Social Connections: Moderately Isolated (09/14/2021)   Social Connection and Isolation Panel [NHANES]    Frequency of Communication with Friends and Family: Three times a week    Frequency of Social Gatherings with Friends and  Family: Once a week    Attends Religious Services: Never    Active Member of Clubs or Organizations: No    Attends Archivist Meetings: Never    Marital Status: Married  Human resources officer Violence: Not At Risk (09/14/2021)   Humiliation, Afraid, Rape, and Kick questionnaire    Fear of Current or Ex-Partner: No    Emotionally Abused: No    Physically Abused: No    Sexually Abused: No    Allergies  Allergen Reactions   Amitriptyline Other (See Comments)    hallucinations   Effexor [Venlafaxine] Other (See Comments)    Amnesia, hospitalized x 2 weeks   Haldol [Haloperidol] Other (See Comments)    Paralyzed vocal cords      Constitutional: Denies fever, malaise, fatigue, headache or abrupt weight changes.   HEENT: Patient reports left ear discomfort.  Denies ear  pain, drainage, loss of hearing, runny nose, nasal congestion or sore throat. GU: Pt reports frequency and burning with urination. Denies urgency, dysuria, blood in urine, odor or discharge. Skin: Denies redness, rashes, lesions or ulcercations.   No other specific complaints in a complete review of systems (except as listed in HPI above).    Objective:   Physical Exam BP 128/74 (BP Location: Left Arm, Patient Position: Sitting, Cuff Size: Normal)   Pulse 76   Temp (!) 96.4 F (35.8 C) (Temporal)   Wt 212 lb (96.2 kg)   SpO2 98%   BMI 34.22 kg/m   Wt Readings from Last 3 Encounters:  05/01/22 209 lb 12.8 oz (95.2 kg)  04/08/22 205 lb (93 kg)  03/07/22 207 lb 3.7 oz (94 kg)    General: Appears her stated age, obese, in NAD. HEENT: Left ear: Positive serous effusion, minimal wax buildup. Cardiovascular: Normal rate and rhythm. S1,S2 noted.   Pulmonary/Chest: Normal effort and positive vesicular breath sounds. No respiratory distress. No wheezes, rales or ronchi noted.  Abdomen: Soft and nontender.  No CVA tenderness.        Assessment & Plan:   Urinary Frequency, Burning with Urination, Postmenopausal:  Urinalysis: Normal Will send urine culture OK to take AZO OTC Drink plenty of fluids Did consider Estrace cream however given that she still has her uterus, this should not be used without progesterone as it can increase risk of endometrial cancer Consider referral to GYN for further evaluation and treatment  EDT, Left:  Use Flonase twice daily x 1 week RTC in 4 months for annual exam [name], NP

## 2022-06-14 NOTE — Patient Instructions (Signed)
Eustachian Tube Dysfunction  Eustachian tube dysfunction refers to a condition in which a blockage develops in the narrow passage that connects the middle ear to the back of the nose (eustachian tube). The eustachian tube regulates air pressure in the middle ear by letting air move between the ear and nose. It also helps to drain fluid from the middle ear space. Eustachian tube dysfunction can affect one or both ears. When the eustachian tube does not function properly, air pressure, fluid, or both can build up in the middle ear. What are the causes? This condition occurs when the eustachian tube becomes blocked or cannot open normally. Common causes of this condition include: Ear infections. Colds and other infections that affect the nose, mouth, and throat (upper respiratory tract). Allergies. Irritation from cigarette smoke. Irritation from stomach acid coming up into the esophagus (gastroesophageal reflux). The esophagus is the part of the body that moves food from the mouth to the stomach. Sudden changes in air pressure, such as from descending in an airplane or scuba diving. Abnormal growths in the nose or throat, such as: Growths that line the nose (nasal polyps). Abnormal growth of cells (tumors). Enlarged tissue at the back of the throat (adenoids). What increases the risk? You are more likely to develop this condition if: You smoke. You are overweight. You are a child who has: Certain birth defects of the mouth, such as cleft palate. Large tonsils or adenoids. What are the signs or symptoms? Common symptoms of this condition include: A feeling of fullness in the ear. Ear pain. Clicking or popping noises in the ear. Ringing in the ear (tinnitus). Hearing loss. Loss of balance. Dizziness. Symptoms may get worse when the air pressure around you changes, such as when you travel to an area of high elevation, fly on an airplane, or go scuba diving. How is this diagnosed? This  condition may be diagnosed based on: Your symptoms. A physical exam of your ears, nose, and throat. Tests, such as those that measure: The movement of your eardrum. Your hearing (audiometry). How is this treated? Treatment depends on the cause and severity of your condition. In mild cases, you may relieve your symptoms by moving air into your ears. This is called "popping the ears." In more severe cases, or if you have symptoms of fluid in your ears, treatment may include: Medicines to relieve congestion (decongestants). Medicines that treat allergies (antihistamines). Nasal sprays or ear drops that contain medicines that reduce swelling (steroids). A procedure to drain the fluid in your eardrum. In this procedure, a small tube may be placed in the eardrum to: Drain the fluid. Restore the air in the middle ear space. A procedure to insert a balloon device through the nose to inflate the opening of the eustachian tube (balloon dilation). Follow these instructions at home: Lifestyle Do not do any of the following until your health care provider approves: Travel to high altitudes. Fly in airplanes. Work in a pressurized cabin or room. Scuba dive. Do not use any products that contain nicotine or tobacco. These products include cigarettes, chewing tobacco, and vaping devices, such as e-cigarettes. If you need help quitting, ask your health care provider. Keep your ears dry. Wear fitted earplugs during showering and bathing. Dry your ears completely after. General instructions Take over-the-counter and prescription medicines only as told by your health care provider. Use techniques to help pop your ears as recommended by your health care provider. These may include: Chewing gum. Yawning. Frequent, forceful swallowing.   Closing your mouth, holding your nose closed, and gently blowing as if you are trying to blow air out of your nose. Keep all follow-up visits. This is important. Contact a  health care provider if: Your symptoms do not go away after treatment. Your symptoms come back after treatment. You are unable to pop your ears. You have: A fever. Pain in your ear. Pain in your head or neck. Fluid draining from your ear. Your hearing suddenly changes. You become very dizzy. You lose your balance. Get help right away if: You have a sudden, severe increase in any of your symptoms. Summary Eustachian tube dysfunction refers to a condition in which a blockage develops in the eustachian tube. It can be caused by ear infections, allergies, inhaled irritants, or abnormal growths in the nose or throat. Symptoms may include ear pain or fullness, hearing loss, or ringing in the ears. Mild cases are treated with techniques to unblock the ears, such as yawning or chewing gum. More severe cases are treated with medicines or procedures. This information is not intended to replace advice given to you by your health care provider. Make sure you discuss any questions you have with your health care provider. Document Revised: 07/03/2020 Document Reviewed: 07/03/2020 Elsevier Patient Education  2023 Elsevier Inc.  

## 2022-06-15 LAB — URINE CULTURE
MICRO NUMBER:: 14545242
SPECIMEN QUALITY:: ADEQUATE

## 2022-06-23 DIAGNOSIS — J0140 Acute pansinusitis, unspecified: Secondary | ICD-10-CM | POA: Diagnosis not present

## 2022-06-23 DIAGNOSIS — J4531 Mild persistent asthma with (acute) exacerbation: Secondary | ICD-10-CM | POA: Diagnosis not present

## 2022-06-23 DIAGNOSIS — H66011 Acute suppurative otitis media with spontaneous rupture of ear drum, right ear: Secondary | ICD-10-CM | POA: Diagnosis not present

## 2022-06-23 DIAGNOSIS — Z03818 Encounter for observation for suspected exposure to other biological agents ruled out: Secondary | ICD-10-CM | POA: Diagnosis not present

## 2022-07-11 DIAGNOSIS — H35033 Hypertensive retinopathy, bilateral: Secondary | ICD-10-CM | POA: Diagnosis not present

## 2022-07-11 DIAGNOSIS — H43392 Other vitreous opacities, left eye: Secondary | ICD-10-CM | POA: Diagnosis not present

## 2022-07-11 DIAGNOSIS — H35372 Puckering of macula, left eye: Secondary | ICD-10-CM | POA: Diagnosis not present

## 2022-07-11 DIAGNOSIS — H26493 Other secondary cataract, bilateral: Secondary | ICD-10-CM | POA: Diagnosis not present

## 2022-07-22 ENCOUNTER — Other Ambulatory Visit: Payer: Self-pay | Admitting: Internal Medicine

## 2022-07-22 DIAGNOSIS — G4709 Other insomnia: Secondary | ICD-10-CM

## 2022-07-23 NOTE — Telephone Encounter (Signed)
Requested medication (s) are due for refill today: yes  Requested medication (s) are on the active medication list: yes  Last refill:  04/25/22  Future visit scheduled: yes  Notes to clinic:  Unable to refill per protocol, cannot delegate.      Requested Prescriptions  Pending Prescriptions Disp Refills   QUEtiapine (SEROQUEL) 200 MG tablet [Pharmacy Med Name: QUETIAPINE 200MG  TABLETS] 90 tablet 0    Sig: TAKE 1 TABLET(200 MG) BY MOUTH AT BEDTIME     Not Delegated - Psychiatry:  Antipsychotics - Second Generation (Atypical) - quetiapine Failed - 07/22/2022 11:23 AM      Failed - This refill cannot be delegated      Failed - TSH in normal range and within 360 days    TSH  Date Value Ref Range Status  04/23/2021 1.21 0.40 - 4.50 mIU/L Final         Failed - Lipid Panel in normal range within the last 12 months    Cholesterol  Date Value Ref Range Status  05/01/2022 340 (H) <200 mg/dL Final   LDL Cholesterol (Calc)  Date Value Ref Range Status  05/01/2022 245 (H) mg/dL (calc) Final    Comment:    LDL-C levels > or = 190 mg/dL may indicate familial  hypercholesterolemia (FH). Clinical assessment and  measurement of blood lipid levels should be  considered for all first degree relatives of patients with an FH diagnosis. LDL Cholesterol (LDL-C) levels > or = 300 mg/dL may indicate homozygous familial hypercholesterolemia (HoFH). Untreated,  these extremely high LDL-C levels can result in premature CV events and mortality. Patients should be identified early and provided appropriate interventions to reduce the cumulative LDL-C burden from birth. . For questions about testing for familial hypercholesterolemia, please call Insurance risk surveyor at 1.866.GENE.INFO. Duncan Dull, et al. J National Lipid Association  Recommendations for Patient-Centered Management of Dyslipidemia: Part 1 Journal of  Clinical Lipidology 2015;9(2), 129-169. Cuchel, M. et al.  (2014). Homozygous familial hypercholesterolaemia: new insights and guidance for clinicians to improve detection and clinical management. European Heart Journal, 35(32), [phone]. Reference range: <100 . Desirable range <100 mg/dL for primary prevention;   <70 mg/dL for patients with CHD or diabetic patients  with > or = 2 CHD risk factors. Marland Kitchen LDL-C is now calculated using the Martin-Hopkins  calculation, which is a validated novel method providing  better accuracy than the Friedewald equation in the  estimation of LDL-C.  [name] et al. Annamaria Helling. [id number]): 2061-2068  (http://education.[facility].com/faq/FAQ164)    HDL  Date Value Ref Range Status  05/01/2022 45 (L) > OR = 50 mg/dL Final   Triglycerides  Date Value Ref Range Status  05/01/2022 295 (H) <150 mg/dL Final    Comment:    . If a non-fasting specimen was collected, consider repeat triglyceride testing on a fasting specimen if clinically indicated.  [name] et al. J. of Clin. Lipidol. [id number]. .          Failed - CBC within normal limits and completed in the last 12 months    WBC  Date Value Ref Range Status  05/01/2022 7.1 3.8 - 10.8 Thousand/uL Final   RBC  Date Value Ref Range Status  05/01/2022 5.37 (H) 3.80 - 5.10 Million/uL Final   Hemoglobin  Date Value Ref Range Status  05/01/2022 14.9 11.7 - 15.5 g/dL Final   HCT  Date Value Ref Range Status  05/01/2022 44.4 35.0 - 45.0 % Final   MCHC  Date  Value Ref Range Status  05/01/2022 33.6 32.0 - 36.0 g/dL Final   [facility]  Date Value Ref Range Status  05/01/2022 27.7 27.0 - 33.0 pg Final   MCV  Date Value Ref Range Status  05/01/2022 82.7 80.0 - 100.0 fL Final   No results found for: "PLTCOUNTKUC", "LABPLAT", "POCPLA" RDW  Date Value Ref Range Status  05/01/2022 13.6 11.0 - 15.0 % Final         Passed - Completed PHQ-2 or PHQ-9 in the last 360 days      Passed - Last BP in normal range    BP Readings from Last 1  Encounters:  06/14/22 128/74         Passed - Last Heart Rate in normal range    Pulse Readings from Last 1 Encounters:  06/14/22 76         Passed - Valid encounter within last 6 months    Recent Outpatient Visits           1 month ago Burning with urination   [facility] [place], [name], NP   2 months ago Mixed hyperlipidemia   [facility] [place], [place], [place]   3 months ago Exposure to the flu   [facility] [place], [name], NP   5 months ago [facility] [place], [name], NP   5 months ago Fever of unknown origin   [facility] [place], [place], NP              Passed - CMP within normal limits and completed in the last 12 months    Albumin  Date Value Ref Range Status  03/07/2022 4.1 3.5 - 5.0 g/dL Final   Alkaline Phosphatase  Date Value Ref Range Status  03/07/2022 103 38 - 126 U/L Final   Alkaline phosphatase (APISO)  Date Value Ref Range Status  05/01/2022 108 37 - 153 U/L Final   ALT  Date Value Ref Range Status  05/01/2022 30 (H) 6 - 29 U/L Final   AST  Date Value Ref Range Status  05/01/2022 22 10 - 35 U/L Final   BUN  Date Value Ref Range Status  05/01/2022 12 7 - 25 mg/dL Final   Calcium  Date Value Ref Range Status  05/01/2022 9.3 8.6 - 10.4 mg/dL Final   CO2  Date Value Ref Range Status  05/01/2022 25 20 - 32 mmol/L Final   Creat  Date Value Ref Range Status  05/01/2022 0.85 0.50 - 1.05 mg/dL Final   Glucose, Bld  Date Value Ref Range Status  05/01/2022 164 (H) 65 - 99 mg/dL Final    Comment:    .            Fasting reference interval . For someone without known diabetes, a glucose value >125 mg/dL indicates that they may have diabetes and this should be confirmed with a follow-up test. .    Potassium  Date Value Ref Range Status  05/01/2022 4.1 3.5 - 5.3 mmol/L Final    Sodium  Date Value Ref Range Status  05/01/2022 140 135 - 146 mmol/L Final   Total Bilirubin  Date Value Ref Range Status  05/01/2022 0.3 0.2 - 1.2 mg/dL Final   Protein, ur  Date Value Ref Range Status  12/14/2021 NEGATIVE NEGATIVE mg/dL Final   Protein, UA  Date Value  Ref Range Status  06/14/2022 Negative Negative Final   Total Protein  Date Value Ref Range Status  05/01/2022 6.9 6.1 - 8.1 g/dL Final   GFR, Est African American  Date Value Ref Range Status  10/09/2017 79 > OR = 60 mL/min/1.62m2 Final   GFR calc Af Amer  Date Value Ref Range Status  11/20/2018 >60 >60 mL/min Final   GFR  Date Value Ref Range Status  02/20/2017 76.18 >60.00 mL/min Final   eGFR  Date Value Ref Range Status  05/01/2022 75 > OR = 60 mL/min/1.13m2 Final   GFR, Est Non African American  Date Value Ref Range Status  10/09/2017 69 > OR = 60 mL/min/1.28m2 Final   GFR, Estimated  Date Value Ref Range Status  03/07/2022 >60 >60 mL/min Final    Comment:    (NOTE) Calculated using the CKD-EPI Creatinine Equation (2021)

## 2022-07-29 ENCOUNTER — Encounter (INDEPENDENT_AMBULATORY_CARE_PROVIDER_SITE_OTHER): Payer: Medicare HMO | Admitting: Ophthalmology

## 2022-08-08 ENCOUNTER — Encounter (INDEPENDENT_AMBULATORY_CARE_PROVIDER_SITE_OTHER): Payer: Medicare HMO | Admitting: Ophthalmology

## 2022-08-08 DIAGNOSIS — I10 Essential (primary) hypertension: Secondary | ICD-10-CM | POA: Diagnosis not present

## 2022-08-08 DIAGNOSIS — H35033 Hypertensive retinopathy, bilateral: Secondary | ICD-10-CM

## 2022-08-08 DIAGNOSIS — H43813 Vitreous degeneration, bilateral: Secondary | ICD-10-CM | POA: Diagnosis not present

## 2022-08-08 DIAGNOSIS — H35373 Puckering of macula, bilateral: Secondary | ICD-10-CM | POA: Diagnosis not present

## 2022-08-15 ENCOUNTER — Telehealth (INDEPENDENT_AMBULATORY_CARE_PROVIDER_SITE_OTHER): Payer: Medicare HMO | Admitting: Internal Medicine

## 2022-08-15 ENCOUNTER — Encounter: Payer: Self-pay | Admitting: Internal Medicine

## 2022-08-15 DIAGNOSIS — J301 Allergic rhinitis due to pollen: Secondary | ICD-10-CM

## 2022-08-15 MED ORDER — PROMETHAZINE-DM 6.25-15 MG/5ML PO SYRP: 5.0000 mL | ORAL_SOLUTION | Freq: Four times a day (QID) | ORAL | 0 refills | Status: DC | PRN

## 2022-08-15 MED ORDER — PREDNISONE 10 MG PO TABS: ORAL_TABLET | ORAL | 0 refills | Status: DC

## 2022-08-15 NOTE — Progress Notes (Signed)
Virtual Visit via Video Note  I connected with Michele Newton on 08/15/22 at  4:00 PM EDT by a video enabled telemedicine application and verified that I am speaking with the correct person using two identifiers.  Location: Patient: Home Provider: Office  Persons participating in this video call: Michele Reaper, NP and Michele Newton   I discussed the limitations of evaluation and management by telemedicine and the availability of in person appointments. The patient expressed understanding and agreed to proceed.  History of Present Illness:  Patient report left ear fullness, sore throat and cough. This started 3-4 days ago. She is having difficulty swallowing. The cough is mostly non productive. She denies headache, runny nose, nasal congestion, shortness of breath, vomiting or diarrhea. She has had some chills but denies fever or body aches. She has not had sick contacts that she is aware of. She has a history of asthma, takes Symbicort daily and Albuterol as needed. She took a home covid test which was negative.   Past Medical History:  Diagnosis Date   Anxiety    Arthritis    Asthma    Depression    Frequent headaches    GERD (gastroesophageal reflux disease)    Hydrocephalus (HCC) 1995   Hypertension    Migraine    PONV (postoperative nausea and vomiting)    Psoriatic arthritis (HCC)     Current Outpatient Medications  Medication Sig Dispense Refill   albuterol (PROVENTIL) (2.5 MG/3ML) 0.083% nebulizer solution Take 3 mLs (2.5 mg total) by nebulization every 4 (four) hours as needed for wheezing or shortness of breath. 75 mL 0   budesonide-formoterol (SYMBICORT) 160-4.5 MCG/ACT inhaler Inhale 2 puffs into the lungs 2 (two) times daily. 1 each 3   docusate sodium (COLACE) 100 MG capsule Take 1 capsule (100 mg total) by mouth 2 (two) times daily. 10 capsule 0   fluticasone (FLONASE) 50 MCG/ACT nasal spray Place 2 sprays into both nostrils daily. Use for 4-6 weeks then stop and use  seasonally or as needed. 16 g 3   montelukast (SINGULAIR) 10 MG tablet TAKE 1 TABLET(10 MG) BY MOUTH AT BEDTIME 90 tablet 0   Nebulizers (COMPRESSOR/NEBULIZER) MISC 1 Units by Does not apply route every 4 (four) hours as needed. 1 each 0   pantoprazole (PROTONIX) 40 MG tablet TAKE 1 TABLET(40 MG) BY MOUTH TWICE DAILY 180 tablet 3   PARoxetine (PAXIL) 40 MG tablet Take 1.5 tablets (60 mg total) by mouth every morning. 135 tablet 1   QUEtiapine (SEROQUEL) 200 MG tablet TAKE 1 TABLET(200 MG) BY MOUTH AT BEDTIME 90 tablet 0   traMADol (ULTRAM) 50 MG tablet Take 1 tablet (50 mg total) by mouth every 6 (six) hours as needed. 30 tablet 0   traZODone (DESYREL) 50 MG tablet TAKE 1/2 TO 1 TABLET(25 TO 50 MG) BY MOUTH AT BEDTIME AS NEEDED FOR SLEEP 90 tablet 0   triamcinolone cream (KENALOG) 0.1 % Apply 1 Application topically 2 (two) times daily. 30 g 0   verapamil (CALAN-SR) 180 MG CR tablet TAKE 1 TABLET(180 MG) BY MOUTH AT BEDTIME 90 tablet 0   No current facility-administered medications for this visit.    Allergies  Allergen Reactions   Amitriptyline Other (See Comments)    hallucinations   Effexor [Venlafaxine] Other (See Comments)    Amnesia, hospitalized x 2 weeks   Haldol [Haloperidol] Other (See Comments)    Paralyzed vocal cords     Family History  Problem Relation Age of Onset  Arthritis Mother    Hyperlipidemia Mother    Heart disease Mother 559       Died of heart failure   Hypertension Mother    Diabetes Mother    Depression Mother    Arthritis Father    Cancer Father        prostate cancer   Diabetes Brother    Breast cancer Maternal Grandmother    Colon cancer Neg Hx     Social History   Socioeconomic History   Marital status: Married    Spouse name: Michele HiddenGary   Number of children: 2   Years of education: 14   Highest education level: Not on file  Occupational History    Employer: OTHER  Tobacco Use   Smoking status: Never   Smokeless tobacco: Never  Vaping Use    Vaping Use: Never used  Substance and Sexual Activity   Alcohol use: No   Drug use: No   Sexual activity: Not Currently    Birth control/protection: Surgical  Other Topics Concern   Not on file  Social History Narrative   Michele BraunKaren lives at home with her husband, Michele HiddenGary, of 5 months and a cat named, Puff. She has 2 sons from a previous marriage. She has 4 grandchildren.    Social Determinants of Health   Financial Resource Strain: Low Risk  (09/14/2021)   Overall Financial Resource Strain (CARDIA)    Difficulty of Paying Living Expenses: Not hard at all  Food Insecurity: No Food Insecurity (09/14/2021)   Hunger Vital Sign    Worried About Running Out of Food in the Last Year: Never true    Ran Out of Food in the Last Year: Never true  Transportation Needs: No Transportation Needs (09/14/2021)   PRAPARE - Administrator, Civil ServiceTransportation    Lack of Transportation (Medical): No    Lack of Transportation (Non-Medical): No  Physical Activity: Insufficiently Active (09/14/2021)   Exercise Vital Sign    Days of Exercise per Week: 2 days    Minutes of Exercise per Session: 20 min  Stress: No Stress Concern Present (09/14/2021)   [facility] [employer] - Occupational Stress Questionnaire    Feeling of Stress : Only a little  Social Connections: Moderately Isolated (09/14/2021)   Social Connection and Isolation Panel [NHANES]    Frequency of Communication with Friends and Family: Three times a week    Frequency of Social Gatherings with Friends and Family: Once a week    Attends Religious Services: Never    Database administratorActive Member of Clubs or Organizations: No    Attends BankerClub or Organization Meetings: Never    Marital Status: Married  Catering managerntimate Partner Violence: Not At Risk (09/14/2021)   Humiliation, Afraid, Rape, and Kick questionnaire    Fear of Current or Ex-Partner: No    Emotionally Abused: No    Physically Abused: No    Sexually Abused: No     Constitutional: Denies fever, malaise, fatigue,  headache or abrupt weight changes.  HEENT: Patient reports left ear fullness, sore throat.  Denies eye pain, eye redness, ear pain, ringing in the ears, wax buildup, runny nose, nasal congestion, bloody nose. Respiratory: Patient reports cough.  Denies difficulty breathing, shortness of breath, or sputum production.   Cardiovascular: Denies chest pain, chest tightness, palpitations or swelling in the hands or feet.  Gastrointestinal: Denies abdominal pain, bloating, constipation, diarrhea or blood in the stool.   No other specific complaints in a complete review of systems (except as listed in HPI  above).    Observations/Objective:   Wt Readings from Last 3 Encounters:  06/14/22 212 lb (96.2 kg)  05/01/22 209 lb 12.8 oz (95.2 kg)  04/08/22 205 lb (93 kg)    General: Appears her stated age, obese, in NAD. HEENT: Nose: No congestion noted; Throat/Mouth: Hoarseness noted Pulmonary/Chest: Normal effort.  Dry cough noted.  No respiratory distress.  Neurological: Alert and oriented.   BMET    Component Value Date/Time   NA 140 05/01/2022 1046   K 4.1 05/01/2022 1046   CL 104 05/01/2022 1046   CO2 25 05/01/2022 1046   GLUCOSE 164 (H) 05/01/2022 1046   BUN 12 05/01/2022 1046   CREATININE 0.85 05/01/2022 1046   CALCIUM 9.3 05/01/2022 1046   GFRNONAA >60 03/07/2022 1853   GFRNONAA 69 10/09/2017 1024   GFRAA >60 11/20/2018 0408   GFRAA 79 10/09/2017 1024    Lipid Panel     Component Value Date/Time   CHOL 340 (H) 05/01/2022 1046   TRIG 295 (H) 05/01/2022 1046   HDL 45 (L) 05/01/2022 1046   CHOLHDL 7.6 (H) 05/01/2022 1046   VLDL 33.0 02/20/2017 0841   LDLCALC 245 (H) 05/01/2022 1046    CBC    Component Value Date/Time   WBC 7.1 05/01/2022 1046   RBC 5.37 (H) 05/01/2022 1046   HGB 14.9 05/01/2022 1046   HCT 44.4 05/01/2022 1046   PLT 490 (H) 05/01/2022 1046   MCV 82.7 05/01/2022 1046   MCH 27.7 05/01/2022 1046   MCHC 33.6 05/01/2022 1046   RDW 13.6 05/01/2022 1046    LYMPHSABS 2.2 07/18/2020 1054   MONOABS 0.8 07/18/2020 1054   EOSABS 0.1 07/18/2020 1054   BASOSABS 0.1 07/18/2020 1054    Hgb A1C Lab Results  Component Value Date   HGBA1C 5.5 10/29/2021       Assessment and Plan:  Allergic Rhinitis:  Can use Zyrtec and Flonase OTC Rx for Pred taper x 6 days for symptom management Rx for Promethazine DM cough syrup-sedation caution given No indication for antibiotics at this time  RTC in 2 months for annual exam  Follow Up Instructions:    I discussed the assessment and treatment plan with the patient. The patient was provided an opportunity to ask questions and all were answered. The patient agreed with the plan and demonstrated an understanding of the instructions.   The patient was advised to call back or seek an in-person evaluation if the symptoms worsen or if the condition fails to improve as anticipated.    Michele Reaper, NP

## 2022-08-15 NOTE — Patient Instructions (Signed)
Allergic Rhinitis, Adult  Allergic rhinitis is a reaction to allergens. Allergens are things that can cause an allergic reaction. This condition affects the lining inside the nose (mucous membrane). There are two types of allergic rhinitis: Seasonal. This type is also called hay fever. It happens only during some times of the year. Perennial. This type can happen at any time of the year. This condition cannot be spread from person to person (is not contagious). It can be mild, bad, or very bad. It can develop at any age and may be outgrown. What are the causes? Pollen from grasses, trees, and weeds. Other causes can be: Dust mites. Smoke. Mold. Car fumes. The pee (urine), spit, or dander of pets. Dander is dead skin cells from a pet. What increases the risk? You are more likely to develop this condition if: You have allergies in your family. You have problems like allergies in your family. You may have: Swelling of parts of your eyes and eyelids. Asthma. This affects how you breathe. Long-term redness and swelling on your skin. Food allergies. What are the signs or symptoms? The main symptom of this condition is a runny or stuffy nose (nasal congestion). Other symptoms may include: Sneezing or coughing. Itching and tearing of your eyes. Mucus that drips down the back of your throat (postnasal drip). This may cause a sore throat. Trouble sleeping. Feeling tired. Headache. How is this treated? There is no cure for this condition. You should avoid things that you are allergic to. Treatment can help to relieve symptoms. This may include: Medicines that block allergy symptoms, such as anti-inflammatories or antihistamines. These may be given as a shot, nasal spray, or pill. Avoiding things you are allergic to. Medicines that give you some of what you are allergic to over time. This is called immunotherapy. It is done if other treatments do not help. You may get: Shots. Medicine under  your tongue. Stronger medicines, if other treatments do not help. Follow these instructions at home: Avoiding allergens Find out what things you are allergic to and avoid them. To do this, try these things: If you get allergies any time of year: Replace carpet with wood, tile, or vinyl flooring. Carpet can trap pet dander and dust. Do not smoke. Do not allow smoking in your home. Change your heating and air conditioning filters at least once a month. If you get allergies only some times of the year: Keep windows closed when you can. Plan things to do outside when pollen counts are lowest. Check pollen counts before you plan things to do outside. When you come indoors, change your clothes and shower before you sit on furniture or bedding. If you are allergic to a pet: Keep the pet out of your bedroom. Vacuum, sweep, and dust often. General instructions Take over-the-counter and prescription medicines only as told by your doctor. Drink enough fluid to keep your pee pale yellow. Where to find more information American Academy of Allergy, Asthma & Immunology: aaaai.org Contact a doctor if: You have a fever. You get a cough that does not go away. You make high-pitched whistling sounds when you breathe, most often when you breathe out (wheeze). Your symptoms slow you down. Your symptoms stop you from doing your normal things each day. Get help right away if: You are short of breath. This symptom may be an emergency. Get help right away. Call 911. Do not wait to see if the symptom will go away. Do not drive yourself to the   hospital. This information is not intended to replace advice given to you by your health care provider. Make sure you discuss any questions you have with your health care provider. Document Revised: 12/31/2021 Document Reviewed: 12/31/2021 Elsevier Patient Education  2023 Elsevier Inc.  

## 2022-08-20 DIAGNOSIS — H903 Sensorineural hearing loss, bilateral: Secondary | ICD-10-CM | POA: Diagnosis not present

## 2022-08-23 ENCOUNTER — Ambulatory Visit: Payer: Self-pay

## 2022-08-23 ENCOUNTER — Ambulatory Visit (INDEPENDENT_AMBULATORY_CARE_PROVIDER_SITE_OTHER): Payer: Medicare HMO | Admitting: Family Medicine

## 2022-08-23 VITALS — BP 128/80 | HR 74 | Resp 17 | Ht 66.0 in | Wt 209.4 lb

## 2022-08-23 DIAGNOSIS — J0110 Acute frontal sinusitis, unspecified: Secondary | ICD-10-CM

## 2022-08-23 DIAGNOSIS — H6992 Unspecified Eustachian tube disorder, left ear: Secondary | ICD-10-CM | POA: Diagnosis not present

## 2022-08-23 DIAGNOSIS — B379 Candidiasis, unspecified: Secondary | ICD-10-CM

## 2022-08-23 DIAGNOSIS — J301 Allergic rhinitis due to pollen: Secondary | ICD-10-CM

## 2022-08-23 DIAGNOSIS — J9801 Acute bronchospasm: Secondary | ICD-10-CM

## 2022-08-23 MED ORDER — FLUCONAZOLE 150 MG PO TABS: ORAL_TABLET | ORAL | 0 refills | Status: DC

## 2022-08-23 MED ORDER — AMOXICILLIN-POT CLAVULANATE 875-125 MG PO TABS: 1.0000 | ORAL_TABLET | Freq: Two times a day (BID) | ORAL | 0 refills | Status: DC

## 2022-08-23 MED ORDER — PREDNISONE 20 MG PO TABS: ORAL_TABLET | ORAL | 0 refills | Status: DC

## 2022-08-23 MED ORDER — PROMETHAZINE-DM 6.25-15 MG/5ML PO SYRP: 5.0000 mL | ORAL_SOLUTION | Freq: Four times a day (QID) | ORAL | 0 refills | Status: DC | PRN

## 2022-08-23 NOTE — Patient Instructions (Addendum)
Thank you for coming to the office today.  Augmentin course Re order cough syrup Continue allergy meds Continue Symbicort Extended Prednisone to 7 more day taper Referral to [facility]  Please schedule a Follow-up Appointment to: Return if symptoms worsen or fail to improve.  If you have any other questions or concerns, please feel free to call the office or send a message through MyChart. You may also schedule an earlier appointment if necessary.  Additionally, you may be receiving a survey about your experience at our office within a few days to 1 week by e-mail or mail. We value your feedback.  Saralyn Pilar, DO [facility] [place], [place]

## 2022-08-23 NOTE — Telephone Encounter (Signed)
  Chief Complaint: sore throat Symptoms: sore throat, cough, nasal congestion, L earache, HA, runny nose, fever Frequency: 2 weeks Pertinent Negatives: Patient denies SOB Disposition: ED /[] Urgent Care (no appt availability in office) / Appointment(In office/virtual)/  [place] [facility]/ Home Care/ Refused Recommended Disposition /[] [place] [facility]/  Follow-up with PCP Additional Notes: pt had VV with Rene Kocher, NP on 08/15/22, was prescribed prednisone and recommended OTC Flonase and Zyrtec. Pt states while taking prednisone she felt a little better but now feeling worse. Has been taking Tylenol Sinus but no relief. Scheduled OV today at 1400 with Dr. Kirtland Bouchard.   Reason for Disposition  Earache also present  Answer Assessment - Initial Assessment Questions 1. ONSET: "When did the throat start hurting?" (Hours or days ago)      2 weeks  2. SEVERITY: "How bad is the sore throat?" (Scale 1-10; mild, moderate or severe)   - MILD (1-3):  Doesn't interfere with eating or normal activities.   - MODERATE (4-7): Interferes with eating some solids and normal activities.   - SEVERE (8-10):  Excruciating pain, interferes with most normal activities.   - SEVERE WITH DYSPHAGIA (10): Can't swallow liquids, drooling.     Moderate  4.  VIRAL SYMPTOMS: "Are there any symptoms of a cold, such as a runny nose, cough, hoarse voice or red eyes?"      yes 5. FEVER: "Do you have a fever?" If Yes, ask: "What is your temperature, how was it measured, and when did it start?"     Low grade  7. OTHER SYMPTOMS: "Do you have any other symptoms?" (e.g., difficulty breathing, headache, rash)     L earache, nasal congestion, cough, runny nose, HA  Protocols used: Sore Throat-A-AH

## 2022-08-23 NOTE — Progress Notes (Signed)
Subjective:    Patient ID: Michele Newton, female    DOB: 06/19/1955, 67 y.o.   MRN: [id number]  Michele Newton is a 67 y.o. female presenting on 08/23/2022 for Sore Throat (Coughing, sinus drainage, ears hurt and temp last night of 101.  Sympltoms for 2 weeks.  Seen Nicki Reaper, NP 08/15/22 video visit.  Got some better after prednisone but symptoms came back. )  Patient presents for a same day appointment.  PCP Nicki Reaper, FNP   HPI  Sinus Congestion, Headache, Ear Ache, Sore Throat  Recent virtual visit 08/15/22, she was seen by Nicki Reaper, FNP and placed on Prednisone taper She felt better initially then worse again, describes sore throat, bilateral ears L>R with fullness and pain, and sinus drainage and cough with thicker productive mucus  She has continued the Zyrtec, Flonase, Singulair  She has history of R ear perforation, chronic. She has had prior Tymp tubes. And prior sinus surgery Interested to return to ENT consultation She has had prior Allergy testing, interested in repeat Has tried Azelastine  Cough worse at night  UTD Flu Shot currently and primary series COVID vaccine and 1 booster.      08/23/2022    2:05 PM 06/14/2022   10:39 AM 01/09/2022   10:36 AM  Depression screen PHQ 2/9  Decreased Interest 2 0 0  Down, Depressed, Hopeless 2 0 0  PHQ - 2 Score 4 0 0  Altered sleeping 2  0  Tired, decreased energy 2  0  Change in appetite 2  0  Feeling bad or failure about yourself  0  0  Trouble concentrating 1  0  Moving slowly or fidgety/restless 1  0  Suicidal thoughts 0  0  PHQ-9 Score 12  0  Difficult doing work/chores Not difficult at all  Not difficult at all    Social History   Tobacco Use   Smoking status: Never   Smokeless tobacco: Never  Vaping Use   Vaping Use: Never used  Substance Use Topics   Alcohol use: No   Drug use: No    Review of Systems Per HPI unless specifically indicated above     Objective:    BP 128/80 (BP Location: Left  Arm, Patient Position: Sitting, Cuff Size: Normal)   Pulse 74   Resp 17   Ht  (1.676 m)   Wt 209 lb 6.4 oz (95 kg)   SpO2 96%   BMI 33.80 kg/m   Wt Readings from Last 3 Encounters:  08/23/22 209 lb 6.4 oz (95 kg)  06/14/22 212 lb (96.2 kg)  05/01/22 209 lb 12.8 oz (95.2 kg)    Physical Exam Vitals and nursing note reviewed.  Constitutional:      General: She is not in acute distress.    Appearance: She is well-developed. She is not diaphoretic.     Comments: Well-appearing, comfortable, cooperative  HENT:     Head: Normocephalic and atraumatic.     Right Ear: There is no impacted cerumen.     Left Ear: There is no impacted cerumen.     Ears:     Comments: R TM perforation chronic  L TM effusion    Nose: Congestion present.     Mouth/Throat:     Mouth: Mucous membranes are moist.     Pharynx: No oropharyngeal exudate or posterior oropharyngeal erythema.  Eyes:     General:        Right eye: No discharge.  Left eye: No discharge.     Conjunctiva/sclera: Conjunctivae normal.  Neck:     Thyroid: No thyromegaly.  Cardiovascular:     Rate and Rhythm: Normal rate and regular rhythm.     Heart sounds: Normal heart sounds. No murmur heard. Pulmonary:     Effort: Pulmonary effort is normal. No respiratory distress.     Breath sounds: Normal breath sounds. No wheezing or rales.  Musculoskeletal:        General: Normal range of motion.     Cervical back: Normal range of motion and neck supple.  Lymphadenopathy:     Cervical: No cervical adenopathy.  Skin:    General: Skin is warm and dry.     Findings: No erythema or rash.  Neurological:     Mental Status: She is alert and oriented to person, place, and time.  Psychiatric:        Behavior: Behavior normal.     Comments: Well groomed, good eye contact, normal speech and thoughts    Results for orders placed or performed in visit on 06/14/22  Urine Culture   Specimen: Urine  Result Value Ref Range   MICRO  NUMBER: [id number]    SPECIMEN QUALITY: Adequate    Sample Source URINE, CLEAN CATCH    STATUS: FINAL    Result:      Mixed genital flora isolated. These superficial bacteria are not indicative of a urinary tract infection. No further organism identification is warranted on this specimen. If clinically indicated, recollect clean-catch, mid-stream urine and transfer  immediately to Urine Culture Transport Tube.   POCT urinalysis dipstick  Result Value Ref Range   Color, UA     Clarity, UA     Glucose, UA Negative Negative   Bilirubin, UA Negative    Ketones, UA Negative    Spec Grav, UA >=1.030 (A) 1.010 - 1.025   Blood, UA Negative    pH, UA 6.0 5.0 - 8.0   Protein, UA Negative Negative   Urobilinogen, UA 0.2 0.2 or 1.0 E.U./dL   Nitrite, UA Negative    Leukocytes, UA Negative Negative   Appearance     Odor        Assessment & Plan:   Problem List Items Addressed This Visit   None Visit Diagnoses     Acute non-recurrent frontal sinusitis    -  Primary   Relevant Medications   amoxicillin-clavulanate (AUGMENTIN) 875-125 MG tablet   predniSONE (DELTASONE) 20 MG tablet   promethazine-dextromethorphan (PROMETHAZINE-DM) 6.25-15 MG/5ML syrup   fluconazole (DIFLUCAN) 150 MG tablet   Other Relevant Orders   Ambulatory referral to ENT   Seasonal allergic rhinitis due to pollen       ETD (Eustachian tube dysfunction), left       Bronchospasm       Relevant Medications   predniSONE (DELTASONE) 20 MG tablet   promethazine-dextromethorphan (PROMETHAZINE-DM) 6.25-15 MG/5ML syrup   Antibiotic-induced yeast infection       Relevant Medications   fluconazole (DIFLUCAN) 150 MG tablet       Consistent with acute vs recurrent frontal sinusitis Underlying allergy symptoms present as well, with chronic allergic symptoms  worsening concern for bacterial infection given new worsening change and current history of recurrent sinusitis with known sinus problems s/p surgery     Plan: Augmentin course Add diflucan for possible yeast after antibiotics  Re order cough syrup w/o codeine Continue allergy meds Continue Symbicort Extended Prednisone to 7 more day taper [facility]erral [facility]  [facility]  referral to ENT, she has chronic sinus / allergy problems. Prior history R ear perforation, tymp tubes, sinus surgery. Now has chronic allergies recurrent sinusitis, and secondary asthma. She is interested in further evaluation, allergy testing and therapy.   Orders Placed This Encounter  Procedures   Ambulatory referral to ENT    Referral Priority:   Routine    Referral Type:   Consultation    Referral Reason:   Specialty Services Required    Requested Specialty:   Otolaryngology    Number of Visits Requested:   1    Meds ordered this encounter  Medications   amoxicillin-clavulanate (AUGMENTIN) 875-125 MG tablet    Sig: Take 1 tablet by mouth 2 (two) times daily.    Dispense:  20 tablet    Refill:  0   predniSONE (DELTASONE) 20 MG tablet    Sig: Take daily with food. Start with 60mg  (3 pills) x 2 days, then reduce to 40mg  (2 pills) x 2 days, then 20mg  (1 pill) x 3 days    Dispense:  13 tablet    Refill:  0   promethazine-dextromethorphan (PROMETHAZINE-DM) 6.25-15 MG/5ML syrup    Sig: Take 5 mLs by mouth 4 (four) times daily as needed.    Dispense:  118 mL    Refill:  0   fluconazole (DIFLUCAN) 150 MG tablet    Sig: Take one tablet by mouth on Day 1. Repeat dose 2nd tablet on Day 3.    Dispense:  2 tablet    Refill:  0      Follow up plan: Return if symptoms worsen or fail to improve.   Saralyn Pilar, DO [facility] 08/23/2022, 2:13 PM

## 2022-08-29 DIAGNOSIS — H903 Sensorineural hearing loss, bilateral: Secondary | ICD-10-CM | POA: Diagnosis not present

## 2022-09-11 ENCOUNTER — Other Ambulatory Visit: Payer: Self-pay | Admitting: Internal Medicine

## 2022-09-11 DIAGNOSIS — J4520 Mild intermittent asthma, uncomplicated: Secondary | ICD-10-CM

## 2022-09-11 DIAGNOSIS — F5104 Psychophysiologic insomnia: Secondary | ICD-10-CM

## 2022-09-11 NOTE — Telephone Encounter (Signed)
Requested Prescriptions  Pending Prescriptions Disp Refills   verapamil (CALAN-SR) 180 MG CR tablet [Pharmacy Med Name: VERAPAMIL ER 180MG  TABLETS] 90 tablet 0    Sig: TAKE 1 TABLET(180 MG) BY MOUTH AT BEDTIME     Cardiovascular: Calcium Channel Blockers 3 Failed - 09/11/2022  7:04 AM      Failed - ALT in normal range and within 360 days    ALT  Date Value Ref Range Status  05/01/2022 30 (H) 6 - 29 U/L Final         Passed - AST in normal range and within 360 days    AST  Date Value Ref Range Status  05/01/2022 22 10 - 35 U/L Final         Passed - Cr in normal range and within 360 days    Creat  Date Value Ref Range Status  05/01/2022 0.85 0.50 - 1.05 mg/dL Final         Passed - Last BP in normal range    BP Readings from Last 1 Encounters:  08/23/22 128/80         Passed - Last Heart Rate in normal range    Pulse Readings from Last 1 Encounters:  08/23/22 74         Passed - Valid encounter within last 6 months    Recent Outpatient Visits           2 weeks ago Acute non-recurrent frontal sinusitis   [place] [facility] [place], Netta Neat, DO   3 weeks ago Seasonal allergic rhinitis due to pollen   [facility] [place], Salvadore Oxford, NP   2 months ago Burning with urination   [place] [facility] [place], Salvadore Oxford, NP   4 months ago Mixed hyperlipidemia   [place] [facility] [place], [place], NP   5 months ago Exposure to the flu   [facility] [place], Salvadore Oxford, NP               traZODone (DESYREL) 50 MG tablet [Pharmacy Med Name: TRAZODONE 50MG  TABLETS] 90 tablet 0    Sig: TAKE 1/2 TO 1 TABLET(25 TO 50 MG) BY MOUTH AT BEDTIME AS NEEDED FOR SLEEP     Psychiatry: Antidepressants - Serotonin Modulator Passed - 09/11/2022  7:04 AM      Passed - Completed PHQ-2 or PHQ-9 in the last 360 days      Passed - Valid encounter within last 6 months    Recent  Outpatient Visits           2 weeks ago Acute non-recurrent frontal sinusitis   [facility] [place], Netta Neat, DO   3 weeks ago Seasonal allergic rhinitis due to pollen   [place] [facility] [place], Salvadore Oxford, NP   2 months ago Burning with urination   [place] [facility] [place], Salvadore Oxford, NP   4 months ago Mixed hyperlipidemia    [facility] [place], [place], NP   5 months ago Exposure to the flu   [facility] [place], Salvadore Oxford, NP               montelukast (SINGULAIR) 10 MG tablet [Pharmacy Med Name: MONTELUKAST 10MG  TABLETS] 90 tablet 0    Sig: TAKE 1 TABLET(10 MG) BY MOUTH AT BEDTIME  Pulmonology:  Leukotriene Inhibitors Passed - 09/11/2022  7:04 AM      Passed - Valid encounter within last 12 months    Recent Outpatient Visits           2 weeks ago Acute non-recurrent frontal sinusitis   [place] [facility] [place], Netta Neat, DO   3 weeks ago Seasonal allergic rhinitis due to pollen   [facility] [place], Salvadore Oxford, NP   2 months ago Burning with urination   [place] [facility] [place], Salvadore Oxford, NP   4 months ago Mixed hyperlipidemia   [place] [facility] [place], Salvadore Oxford, NP   5 months ago Exposure to the flu   [facility] [place], Salvadore Oxford, NP

## 2022-09-13 ENCOUNTER — Ambulatory Visit: Payer: Self-pay

## 2022-09-13 NOTE — Telephone Encounter (Signed)
Pt stated she was diagnosed with cystitis and would like to request a Rx to be sent [id number] [address], [place]. Offered to schedule pt for an appt but she declined and requested that a nurse return her call. Cb# [phone]   Left message to call back about symptoms.

## 2022-09-13 NOTE — Telephone Encounter (Signed)
Second attempt to reach pt. Left message to call back about symptoms. 

## 2022-09-13 NOTE — Telephone Encounter (Signed)
Reason for Disposition  Urinating more frequently than usual (i.e., frequency)  Answer Assessment - Initial Assessment Questions 1. SYMPTOM: "What's the main symptom you're concerned about?" (e.g., frequency, incontinence)     I have cystitis I want an antibiotic called in.    I work at [employer].    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    2. ONSET: "When did the  burning  start?"     I'm using Azo because I'm burning so bad with urination.   My pee is orange. 3. PAIN: "Is there any pain?" If Yes, ask: "How bad is it?" (Scale: 1-10; mild, moderate, severe)     Yes   It burns real bad.   I'm having a lot of lower back pain on both sides.   4. CAUSE: "What do you think is causing the symptoms?"     UTI   Cystitis 5. OTHER SYMPTOMS: "Do you have any other symptoms?" (e.g., blood in urine, fever, flank pain, pain with urination)     Lower back pain across both sides.    Just burning real bad an. 6. PREGNANCY: "Is there any chance you are pregnant?" "When was your last menstrual period?"     N/A due to age  Protocols used: Urinary Symptoms-A-AH  Chief Complaint: burning with urination and lower back pain bilaterally with frequency Symptoms: above Frequency: urinating frequently Pertinent Negatives: Patient denies blood in urine Disposition: [] ED /[x] Urgent Care (no appt availability in office) / [] Appointment(In office/virtual)/ []  [place] [facility]/ [] Home Care/ [] Refused Recommended Disposition /[] [place] [facility]/ []  Follow-up with PCP Additional Notes: No appt available at [facility] pt has decided to go on to the walk in clinic due to burning so bad with urination and going into the weekend.

## 2022-09-26 ENCOUNTER — Ambulatory Visit (INDEPENDENT_AMBULATORY_CARE_PROVIDER_SITE_OTHER): Payer: Medicare HMO | Admitting: Internal Medicine

## 2022-09-26 ENCOUNTER — Encounter: Payer: Self-pay | Admitting: Internal Medicine

## 2022-09-26 VITALS — BP 114/68 | HR 77 | Temp 97.3°F | Wt 209.0 lb

## 2022-09-26 DIAGNOSIS — K582 Mixed irritable bowel syndrome: Secondary | ICD-10-CM | POA: Diagnosis not present

## 2022-09-26 MED ORDER — LINACLOTIDE 72 MCG PO CAPS: 72.0000 ug | ORAL_CAPSULE | Freq: Every day | ORAL | 0 refills | Status: DC

## 2022-09-26 NOTE — Progress Notes (Signed)
Subjective:    Patient ID: Michele Newton, female    DOB: 01/28/1956, 67 y.o.   MRN: [id number]  HPI  Patient presents to clinic today with complaint of constipation.  This started several years ago.  She has a BM 1 x week, her last BM was a few days ago. She has some abdominal pain, intermittent nausea, vomiting but denies blood in her stool. She has a history of IBS.  She takes Colace, Milk of Magnesia and Mirilax as needed with minimal relief of symptoms.  She admits that she does not drink a lot of water.  There is no colonoscopy for review.  Review of Systems     Past Medical History:  Diagnosis Date   Anxiety    Arthritis    Asthma    Depression    Frequent headaches    GERD (gastroesophageal reflux disease)    Hydrocephalus (HCC) 1995   Hypertension    Migraine    PONV (postoperative nausea and vomiting)    Psoriatic arthritis (HCC)     Current Outpatient Medications  Medication Sig Dispense Refill   albuterol (PROVENTIL) (2.5 MG/3ML) 0.083% nebulizer solution Take 3 mLs (2.5 mg total) by nebulization every 4 (four) hours as needed for wheezing or shortness of breath. 75 mL 0   amoxicillin-clavulanate (AUGMENTIN) 875-125 MG tablet Take 1 tablet by mouth 2 (two) times daily. 20 tablet 0   budesonide-formoterol (SYMBICORT) 160-4.5 MCG/ACT inhaler Inhale 2 puffs into the lungs 2 (two) times daily. 1 each 3   docusate sodium (COLACE) 100 MG capsule Take 1 capsule (100 mg total) by mouth 2 (two) times daily. 10 capsule 0   fluconazole (DIFLUCAN) 150 MG tablet Take one tablet by mouth on Day 1. Repeat dose 2nd tablet on Day 3. 2 tablet 0   fluticasone (FLONASE) 50 MCG/ACT nasal spray Place 2 sprays into both nostrils daily. Use for 4-6 weeks then stop and use seasonally or as needed. 16 g 3   montelukast (SINGULAIR) 10 MG tablet TAKE 1 TABLET(10 MG) BY MOUTH AT BEDTIME 90 tablet 0   Nebulizers (COMPRESSOR/NEBULIZER) MISC 1 Units by Does not apply route every 4 (four) hours as  needed. 1 each 0   pantoprazole (PROTONIX) 40 MG tablet TAKE 1 TABLET(40 MG) BY MOUTH TWICE DAILY 180 tablet 3   PARoxetine (PAXIL) 40 MG tablet Take 1.5 tablets (60 mg total) by mouth every morning. 135 tablet 1   predniSONE (DELTASONE) 20 MG tablet Take daily with food. Start with 60mg  (3 pills) x 2 days, then reduce to 40mg  (2 pills) x 2 days, then 20mg  (1 pill) x 3 days 13 tablet 0   promethazine-dextromethorphan (PROMETHAZINE-DM) 6.25-15 MG/5ML syrup Take 5 mLs by mouth 4 (four) times daily as needed. 118 mL 0   QUEtiapine (SEROQUEL) 200 MG tablet TAKE 1 TABLET(200 MG) BY MOUTH AT BEDTIME 90 tablet 0   traMADol (ULTRAM) 50 MG tablet Take 1 tablet (50 mg total) by mouth every 6 (six) hours as needed. 30 tablet 0   traZODone (DESYREL) 50 MG tablet TAKE 1/2 TO 1 TABLET(25 TO 50 MG) BY MOUTH AT BEDTIME AS NEEDED FOR SLEEP 90 tablet 0   triamcinolone cream (KENALOG) 0.1 % Apply 1 Application topically 2 (two) times daily. 30 g 0   verapamil (CALAN-SR) 180 MG CR tablet TAKE 1 TABLET(180 MG) BY MOUTH AT BEDTIME 90 tablet 0   No current facility-administered medications for this visit.    Allergies  Allergen Reactions   Amitriptyline  Other (See Comments)    hallucinations   Effexor [Venlafaxine] Other (See Comments)    Amnesia, hospitalized x 2 weeks   Haldol [Haloperidol] Other (See Comments)    Paralyzed vocal cords     Family History  Problem Relation Age of Onset   Arthritis Mother    Hyperlipidemia Mother    Heart disease Mother 36       Died of heart failure   Hypertension Mother    Diabetes Mother    Depression Mother    Arthritis Father    Cancer Father        prostate cancer   Diabetes Brother    Breast cancer Maternal Grandmother    Colon cancer Neg Hx     Social History   Socioeconomic History   Marital status: Married    Spouse name: Michele Newton   Number of children: 2   Years of education: 14   Highest education level: Associate degree: occupational, Scientist, product/process development, or  vocational program  Occupational History    Employer: OTHER  Tobacco Use   Smoking status: Never   Smokeless tobacco: Never  Vaping Use   Vaping Use: Never used  Substance and Sexual Activity   Alcohol use: No   Drug use: No   Sexual activity: Not Currently    Birth control/protection: Surgical  Other Topics Concern   Not on file  Social History Narrative   Michele Newton lives at home with her husband, Michele Newton, of 5 months and a cat named, Puff. She has 2 sons from a previous marriage. She has 4 grandchildren.    Social Determinants of Health   Financial Resource Strain: Low Risk  (09/14/2021)   Overall Financial Resource Strain (CARDIA)    Difficulty of Paying Living Expenses: Not hard at all  Food Insecurity: No Food Insecurity (08/23/2022)   Hunger Vital Sign    Worried About Running Out of Food in the Last Year: Never true    Ran Out of Food in the Last Year: Never true  Transportation Needs: No Transportation Needs (08/23/2022)   PRAPARE - Administrator, Civil Service (Medical): No    Lack of Transportation (Non-Medical): No  Physical Activity: Unknown (08/23/2022)   Exercise Vital Sign    Days of Exercise per Week: 0 days    Minutes of Exercise per Session: Not on file  Recent Concern: Physical Activity - Inactive (08/23/2022)   Exercise Vital Sign    Days of Exercise per Week: 0 days    Minutes of Exercise per Session: 20 min  Stress: Stress Concern Present (08/23/2022)   [employer] of Occupational Health - Occupational Stress Questionnaire    Feeling of Stress : Very much  Social Connections: Moderately Integrated (08/23/2022)   Social Connection and Isolation Panel [NHANES]    Frequency of Communication with Friends and Family: Once a week    Frequency of Social Gatherings with Friends and Family: Never    Attends Religious Services: More than 4 times per year    Active Member of [employer] or Organizations: Yes    Attends Engineer, structural: More than  4 times per year    Marital Status: Married  Catering manager Violence: Not At Risk (09/14/2021)   Humiliation, Afraid, Rape, and Kick questionnaire    Fear of Current or Ex-Partner: No    Emotionally Abused: No    Physically Abused: No    Sexually Abused: No     Constitutional: Denies fever, malaise, fatigue, headache or abrupt  weight changes.  Respiratory: Denies difficulty breathing, shortness of breath, cough or sputum production.   Cardiovascular: Denies chest pain, chest tightness, palpitations or swelling in the hands or feet.  Gastrointestinal: Patient reports intermittent nausea, vomiting, abdominal pain and chronic constipation.  Denies bloating, diarrhea or blood in the stool.  GU: Denies urgency, frequency, pain with urination, burning sensation, blood in urine, odor or discharge. Neurological: Patient reports insomnia.  Denies dizziness, difficulty with memory, difficulty with speech or problems with balance and coordination.   No other specific complaints in a complete review of systems (except as listed in HPI above).  Objective:   Physical Exam  BP 114/68 (BP Location: Left Arm, Patient Position: Sitting, Cuff Size: Large)   Pulse 77   Temp (!) 97.3 F (36.3 C) (Temporal)   Wt 209 lb (94.8 kg)   SpO2 98%   BMI 33.73 kg/m   Wt Readings from Last 3 Encounters:  08/23/22 209 lb 6.4 oz (95 kg)  06/14/22 212 lb (96.2 kg)  05/01/22 209 lb 12.8 oz (95.2 kg)    General: Appears her stated age, obese, in NAD. Cardiovascular: Normal rate and rhythm. S1,S2 noted.  No murmur, rubs or gallops noted. No JVD or BLE edema. No carotid bruits noted. Pulmonary/Chest: Normal effort and positive vesicular breath sounds. No respiratory distress. No wheezes, rales or ronchi noted.  Abdomen: Soft and nontender.  Hypoactive bowel sounds. No distention or masses noted. Musculoskeletal:No difficulty with gait.  Neurological: Alert and oriented. Coordination normal.     BMET     Component Value Date/Time   NA 140 05/01/2022 1046   K 4.1 05/01/2022 1046   CL 104 05/01/2022 1046   CO2 25 05/01/2022 1046   GLUCOSE 164 (H) 05/01/2022 1046   BUN 12 05/01/2022 1046   CREATININE 0.85 05/01/2022 1046   CALCIUM 9.3 05/01/2022 1046   GFRNONAA >60 03/07/2022 1853   GFRNONAA 69 10/09/2017 1024   GFRAA >60 11/20/2018 0408   GFRAA 79 10/09/2017 1024    Lipid Panel     Component Value Date/Time   CHOL 340 (H) 05/01/2022 1046   TRIG 295 (H) 05/01/2022 1046   HDL 45 (L) 05/01/2022 1046   CHOLHDL 7.6 (H) 05/01/2022 1046   VLDL 33.0 02/20/2017 0841   LDLCALC 245 (H) 05/01/2022 1046    CBC    Component Value Date/Time   WBC 7.1 05/01/2022 1046   RBC 5.37 (H) 05/01/2022 1046   HGB 14.9 05/01/2022 1046   HCT 44.4 05/01/2022 1046   PLT 490 (H) 05/01/2022 1046   MCV 82.7 05/01/2022 1046   MCH 27.7 05/01/2022 1046   MCHC 33.6 05/01/2022 1046   RDW 13.6 05/01/2022 1046   LYMPHSABS 2.2 07/18/2020 1054   MONOABS 0.8 07/18/2020 1054   EOSABS 0.1 07/18/2020 1054   BASOSABS 0.1 07/18/2020 1054    Hgb A1C Lab Results  Component Value Date   HGBA1C 5.5 10/29/2021           Assessment & Plan:     RTC in 1 month for annual exam Nicki Reaper, NP

## 2022-09-26 NOTE — Patient Instructions (Signed)

## 2022-09-26 NOTE — Assessment & Plan Note (Signed)
Encouraged high-fiber diet and adequate water intake Will trial Linzess 72 mcg daily 

## 2022-10-17 ENCOUNTER — Other Ambulatory Visit: Payer: Self-pay | Admitting: Internal Medicine

## 2022-10-17 DIAGNOSIS — G4709 Other insomnia: Secondary | ICD-10-CM

## 2022-10-17 NOTE — Telephone Encounter (Signed)
Requested medication (s) are due for refill today: Due 10/23/22  Requested medication (s) are on the active medication list: yes    Last refill: 07/23/22  #90  0 refills  Future visit scheduled yes  10/31/22  Notes to clinic: Not delegated, please review. Thank you.  Requested Prescriptions  Pending Prescriptions Disp Refills   QUEtiapine (SEROQUEL) 200 MG tablet [Pharmacy Med Name: QUETIAPINE 200MG  TABLETS] 90 tablet 0    Sig: TAKE 1 TABLET(200 MG) BY MOUTH AT BEDTIME     Not Delegated - Psychiatry:  Antipsychotics - Second Generation (Atypical) - quetiapine Failed - 10/17/2022 11:03 AM      Failed - This refill cannot be delegated      Failed - TSH in normal range and within 360 days    TSH  Date Value Ref Range Status  04/23/2021 1.21 0.40 - 4.50 mIU/L Final         Failed - Lipid Panel in normal range within the last 12 months    Cholesterol  Date Value Ref Range Status  05/01/2022 340 (H) <200 mg/dL Final   LDL Cholesterol (Calc)  Date Value Ref Range Status  05/01/2022 245 (H) mg/dL (calc) Final    Comment:    LDL-C levels > or = 190 mg/dL may indicate familial  hypercholesterolemia (FH). Clinical assessment and  measurement of blood lipid levels should be  considered for all first degree relatives of patients with an FH diagnosis. LDL Cholesterol (LDL-C) levels > or = 300 mg/dL may indicate homozygous familial hypercholesterolemia (HoFH). Untreated,  these extremely high LDL-C levels can result in premature CV events and mortality. Patients should be identified early and provided appropriate interventions to reduce the cumulative LDL-C burden from birth. . For questions about testing for familial hypercholesterolemia, please call Engineer, materials at 1.866.GENE.INFO. Wardell Honour, et al. J National Lipid Association  Recommendations for Patient-Centered Management of Dyslipidemia: Part 1 Journal of  Clinical Lipidology 2015;9(2), 129-169. Cuchel,  M. et al. (2014). Homozygous familial hypercholesterolaemia: new insights and guidance for clinicians to improve detection and clinical management. European Heart Journal, 35(32), [phone]. Reference range: <100 . Desirable range <100 mg/dL for primary prevention;   <70 mg/dL for patients with CHD or diabetic patients  with > or = 2 CHD risk factors. Marland Kitchen LDL-C is now calculated using the Martin-Hopkins  calculation, which is a validated novel method providing  better accuracy than the Friedewald equation in the  estimation of LDL-C.  Horald Pollen et al. Lenox Ahr. 5409;811(91): 2061-2068  (http://education.[facility].com/faq/FAQ164)    HDL  Date Value Ref Range Status  05/01/2022 45 (L) > OR = 50 mg/dL Final   Triglycerides  Date Value Ref Range Status  05/01/2022 295 (H) <150 mg/dL Final    Comment:    . If a non-fasting specimen was collected, consider repeat triglyceride testing on a fasting specimen if clinically indicated.  Perry Mount et al. J. of Clin. Lipidol. 2015;9:129-169. .          Failed - CBC within normal limits and completed in the last 12 months    WBC  Date Value Ref Range Status  05/01/2022 7.1 3.8 - 10.8 Thousand/uL Final   RBC  Date Value Ref Range Status  05/01/2022 5.37 (H) 3.80 - 5.10 Million/uL Final   Hemoglobin  Date Value Ref Range Status  05/01/2022 14.9 11.7 - 15.5 g/dL Final   HCT  Date Value Ref Range Status  05/01/2022 44.4 35.0 - 45.0 % Final  MCHC  Date Value Ref Range Status  05/01/2022 33.6 32.0 - 36.0 g/dL Final   [facility]  Date Value Ref Range Status  05/01/2022 27.7 27.0 - 33.0 pg Final   MCV  Date Value Ref Range Status  05/01/2022 82.7 80.0 - 100.0 fL Final   No results found for: "PLTCOUNTKUC", "LABPLAT", "POCPLA" RDW  Date Value Ref Range Status  05/01/2022 13.6 11.0 - 15.0 % Final         Passed - Completed PHQ-2 or PHQ-9 in the last 360 days      Passed - Last BP in normal range    BP Readings from Last 1  Encounters:  09/26/22 114/68         Passed - Last Heart Rate in normal range    Pulse Readings from Last 1 Encounters:  09/26/22 77         Passed - Valid encounter within last 6 months    Recent Outpatient Visits           3 weeks ago Irritable bowel syndrome with both constipation and diarrhea   [place] [facility] [place], Salvadore Oxford, NP   1 month ago Acute non-recurrent frontal sinusitis   [place] [facility] [place], Netta Neat, DO   2 months ago Seasonal allergic rhinitis due to pollen   [facility] [place], Salvadore Oxford, NP   4 months ago Burning with urination   [place] [facility] [place], Salvadore Oxford, NP   5 months ago Mixed hyperlipidemia   [place] [facility] [place], Salvadore Oxford, NP       Future Appointments             In 2 weeks [place], Salvadore Oxford, NP  [facility]            Passed - CMP within normal limits and completed in the last 12 months    Albumin  Date Value Ref Range Status  03/07/2022 4.1 3.5 - 5.0 g/dL Final   Alkaline Phosphatase  Date Value Ref Range Status  03/07/2022 103 38 - 126 U/L Final   Alkaline phosphatase (APISO)  Date Value Ref Range Status  05/01/2022 108 37 - 153 U/L Final   ALT  Date Value Ref Range Status  05/01/2022 30 (H) 6 - 29 U/L Final   AST  Date Value Ref Range Status  05/01/2022 22 10 - 35 U/L Final   BUN  Date Value Ref Range Status  05/01/2022 12 7 - 25 mg/dL Final   Calcium  Date Value Ref Range Status  05/01/2022 9.3 8.6 - 10.4 mg/dL Final   CO2  Date Value Ref Range Status  05/01/2022 25 20 - 32 mmol/L Final   Creat  Date Value Ref Range Status  05/01/2022 0.85 0.50 - 1.05 mg/dL Final   Glucose, Bld  Date Value Ref Range Status  05/01/2022 164 (H) 65 - 99 mg/dL Final    Comment:    .            Fasting reference interval . For someone without known diabetes, a  glucose value >125 mg/dL indicates that they may have diabetes and this should be confirmed with a follow-up test. .    Potassium  Date Value Ref Range Status  05/01/2022 4.1 3.5 - 5.3 mmol/L Final   Sodium  Date Value Ref Range Status  05/01/2022 140 135 -  146 mmol/L Final   Total Bilirubin  Date Value Ref Range Status  05/01/2022 0.3 0.2 - 1.2 mg/dL Final   Protein, ur  Date Value Ref Range Status  12/14/2021 NEGATIVE NEGATIVE mg/dL Final   Protein, UA  Date Value Ref Range Status  06/14/2022 Negative Negative Final   Total Protein  Date Value Ref Range Status  05/01/2022 6.9 6.1 - 8.1 g/dL Final   GFR, Est African American  Date Value Ref Range Status  10/09/2017 79 > OR = 60 mL/min/1.31m2 Final   GFR calc Af Amer  Date Value Ref Range Status  11/20/2018 >60 >60 mL/min Final   GFR  Date Value Ref Range Status  02/20/2017 76.18 >60.00 mL/min Final   eGFR  Date Value Ref Range Status  05/01/2022 75 > OR = 60 mL/min/1.85m2 Final   GFR, Est Non African American  Date Value Ref Range Status  10/09/2017 69 > OR = 60 mL/min/1.46m2 Final   GFR, Estimated  Date Value Ref Range Status  03/07/2022 >60 >60 mL/min Final    Comment:    (NOTE) Calculated using the CKD-EPI Creatinine Equation (2021)

## 2022-10-30 DIAGNOSIS — J301 Allergic rhinitis due to pollen: Secondary | ICD-10-CM | POA: Diagnosis not present

## 2022-10-30 DIAGNOSIS — H7201 Central perforation of tympanic membrane, right ear: Secondary | ICD-10-CM | POA: Diagnosis not present

## 2022-10-30 DIAGNOSIS — H9211 Otorrhea, right ear: Secondary | ICD-10-CM | POA: Diagnosis not present

## 2022-10-30 DIAGNOSIS — J302 Other seasonal allergic rhinitis: Secondary | ICD-10-CM | POA: Diagnosis not present

## 2022-10-31 ENCOUNTER — Ambulatory Visit (INDEPENDENT_AMBULATORY_CARE_PROVIDER_SITE_OTHER): Payer: Medicare HMO | Admitting: Internal Medicine

## 2022-10-31 ENCOUNTER — Encounter: Payer: Self-pay | Admitting: Internal Medicine

## 2022-10-31 VITALS — BP 128/72 | HR 79 | Temp 96.4°F | Ht 66.0 in | Wt 212.0 lb

## 2022-10-31 DIAGNOSIS — R7309 Other abnormal glucose: Secondary | ICD-10-CM

## 2022-10-31 DIAGNOSIS — Z0001 Encounter for general adult medical examination with abnormal findings: Secondary | ICD-10-CM

## 2022-10-31 DIAGNOSIS — E782 Mixed hyperlipidemia: Secondary | ICD-10-CM

## 2022-10-31 DIAGNOSIS — Z6834 Body mass index (BMI) 34.0-34.9, adult: Secondary | ICD-10-CM

## 2022-10-31 DIAGNOSIS — E6609 Other obesity due to excess calories: Secondary | ICD-10-CM

## 2022-10-31 DIAGNOSIS — Z8349 Family history of other endocrine, nutritional and metabolic diseases: Secondary | ICD-10-CM

## 2022-10-31 NOTE — Progress Notes (Signed)
Subjective:    Patient ID: Michele Newton, female    DOB: Sep 11, 1955, 67 y.o.   MRN: [id number]  HPI  Patient presents to clinic today for her annual exam.  Flu: 04/2022 Tetanus: 01/2010 COVID: Pfizer x 2 Pneumovax: 04/2021 Prevnar: 06/2022 Shingrix: Never Pap smear: no longer screening Mammogram: > 2 years ago Bone density: never Colon Screening: 11/2021, Cologuard Vision screening: annually Dentist: biannually  Diet: She does eat meat. She consumes fruits and veggies. She does eat some fried foods. She drinks mostly water. Exercise: None  Review of Systems     Past Medical History:  Diagnosis Date   Anxiety    Arthritis    Asthma    Depression    Frequent headaches    GERD (gastroesophageal reflux disease)    Hydrocephalus (HCC) 1995   Hypertension    Migraine    PONV (postoperative nausea and vomiting)    Psoriatic arthritis (HCC)     Current Outpatient Medications  Medication Sig Dispense Refill   albuterol (PROVENTIL) (2.5 MG/3ML) 0.083% nebulizer solution Take 3 mLs (2.5 mg total) by nebulization every 4 (four) hours as needed for wheezing or shortness of breath. 75 mL 0   budesonide-formoterol (SYMBICORT) 160-4.5 MCG/ACT inhaler Inhale 2 puffs into the lungs 2 (two) times daily. 1 each 3   docusate sodium (COLACE) 100 MG capsule Take 1 capsule (100 mg total) by mouth 2 (two) times daily. 10 capsule 0   fluticasone (FLONASE) 50 MCG/ACT nasal spray Place 2 sprays into both nostrils daily. Use for 4-6 weeks then stop and use seasonally or as needed. 16 g 3   linaclotide (LINZESS) 72 MCG capsule Take 1 capsule (72 mcg total) by mouth daily before breakfast. 90 capsule 0   montelukast (SINGULAIR) 10 MG tablet TAKE 1 TABLET(10 MG) BY MOUTH AT BEDTIME 90 tablet 0   Nebulizers (COMPRESSOR/NEBULIZER) MISC 1 Units by Does not apply route every 4 (four) hours as needed. 1 each 0   pantoprazole (PROTONIX) 40 MG tablet TAKE 1 TABLET(40 MG) BY MOUTH TWICE DAILY 180 tablet 3    PARoxetine (PAXIL) 40 MG tablet Take 1.5 tablets (60 mg total) by mouth every morning. 135 tablet 1   QUEtiapine (SEROQUEL) 200 MG tablet TAKE 1 TABLET(200 MG) BY MOUTH AT BEDTIME 90 tablet 0   traMADol (ULTRAM) 50 MG tablet Take 1 tablet (50 mg total) by mouth every 6 (six) hours as needed. 30 tablet 0   traZODone (DESYREL) 50 MG tablet TAKE 1/2 TO 1 TABLET(25 TO 50 MG) BY MOUTH AT BEDTIME AS NEEDED FOR SLEEP 90 tablet 0   verapamil (CALAN-SR) 180 MG CR tablet TAKE 1 TABLET(180 MG) BY MOUTH AT BEDTIME 90 tablet 0   No current facility-administered medications for this visit.    Allergies  Allergen Reactions   Amitriptyline Other (See Comments)    hallucinations   Effexor [Venlafaxine] Other (See Comments)    Amnesia, hospitalized x 2 weeks   Haldol [Haloperidol] Other (See Comments)    Paralyzed vocal cords     Family History  Problem Relation Age of Onset   Arthritis Mother    Hyperlipidemia Mother    Heart disease Mother 23       Died of heart failure   Hypertension Mother    Diabetes Mother    Depression Mother    Arthritis Father    Cancer Father        prostate cancer   Diabetes Brother    Breast cancer Maternal Grandmother  Colon cancer Neg Hx     Social History   Socioeconomic History   Marital status: Married    Spouse name: Jillyn Hidden   Number of children: 2   Years of education: 14   Highest education level: Associate degree: occupational, Scientist, product/process development, or vocational program  Occupational History    Employer: OTHER  Tobacco Use   Smoking status: Never   Smokeless tobacco: Never  Vaping Use   Vaping Use: Never used  Substance and Sexual Activity   Alcohol use: No   Drug use: No   Sexual activity: Not Currently    Birth control/protection: Surgical  Other Topics Concern   Not on file  Social History Narrative   Coralie lives at home with her husband, Jillyn Hidden, of 5 months and a cat named, Puff. She has 2 sons from a previous marriage. She has 4 grandchildren.     Social Determinants of Health   Financial Resource Strain: Low Risk  (09/14/2021)   Overall Financial Resource Strain (CARDIA)    Difficulty of Paying Living Expenses: Not hard at all  Food Insecurity: No Food Insecurity (08/23/2022)   Hunger Vital Sign    Worried About Running Out of Food in the Last Year: Never true    Ran Out of Food in the Last Year: Never true  Transportation Needs: No Transportation Needs (08/23/2022)   PRAPARE - Administrator, Civil Service (Medical): No    Lack of Transportation (Non-Medical): No  Physical Activity: Unknown (08/23/2022)   Exercise Vital Sign    Days of Exercise per Week: 0 days    Minutes of Exercise per Session: Not on file  Recent Concern: Physical Activity - Inactive (08/23/2022)   Exercise Vital Sign    Days of Exercise per Week: 0 days    Minutes of Exercise per Session: 20 min  Stress: Stress Concern Present (08/23/2022)   [employer] of Occupational Health - Occupational Stress Questionnaire    Feeling of Stress : Very much  Social Connections: Moderately Integrated (08/23/2022)   Social Connection and Isolation Panel [NHANES]    Frequency of Communication with Friends and Family: Once a week    Frequency of Social Gatherings with Friends and Family: Never    Attends Religious Services: More than 4 times per year    Active Member of [employer] or Organizations: Yes    Attends Engineer, structural: More than 4 times per year    Marital Status: Married  Catering manager Violence: Not At Risk (09/14/2021)   Humiliation, Afraid, Rape, and Kick questionnaire    Fear of Current or Ex-Partner: No    Emotionally Abused: No    Physically Abused: No    Sexually Abused: No     Constitutional: Denies fever, malaise, fatigue, headache or abrupt weight changes.  HEENT: Denies eye pain, eye redness, ear pain, ringing in the ears, wax buildup, runny nose, nasal congestion, bloody nose, or sore throat. Respiratory: Denies  difficulty breathing, shortness of breath, cough or sputum production.   Cardiovascular: Denies chest pain, chest tightness, palpitations or swelling in the hands or feet.  Gastrointestinal: Patient reports alternating constipation and diarrhea.  Denies abdominal pain, bloating, or blood in the stool.  GU: Denies urgency, frequency, pain with urination, burning sensation, blood in urine, odor or discharge. Musculoskeletal: Patient reports chronic back pain, left hip pain.  Denies decrease in range of motion, difficulty with gait, muscle pain or joint swelling.  Skin: Denies redness, rashes, lesions or ulcercations.  Neurological: Patient reports insomnia.  Denies dizziness, difficulty with memory, difficulty with speech or problems with balance and coordination.  Psych: Patient has a history of anxiety and depression.  Denies SI/HI.  No other specific complaints in a complete review of systems (except as listed in HPI above).  Objective:   Physical Exam  BP 128/72 (BP Location: Left Arm, Patient Position: Sitting, Cuff Size: Normal)   Pulse 79   Temp (!) 96.4 F (35.8 C) (Temporal)   Ht 5\' 6"  (1.676 m)   Wt 212 lb (96.2 kg)   SpO2 97%   BMI 34.22 kg/m   Wt Readings from Last 3 Encounters:  09/26/22 209 lb (94.8 kg)  08/23/22 209 lb 6.4 oz (95 kg)  06/14/22 212 lb (96.2 kg)    General: Appears her stated age, obese, in NAD. Skin: Warm, dry and intact.  HEENT: Head: normal shape and size; Eyes: sclera white, no icterus, conjunctiva pink, PERRLA and EOMs intact;  Neck:  Neck supple, trachea midline. No masses, lumps or thyromegaly present.  Shunt palpable on the left side of the neck. Cardiovascular: Normal rate and rhythm. S1,S2 noted.  No murmur, rubs or gallops noted. No JVD or BLE edema. No carotid bruits noted. Pulmonary/Chest: Normal effort and positive vesicular breath sounds. No respiratory distress. No wheezes, rales or ronchi noted.  Abdomen: Normal bowel sounds.   Musculoskeletal: Strength 5/5 BUE/BLE.  No difficulty with gait.  Neurological: Alert and oriented. Cranial nerves II-XII grossly intact. Coordination normal.  Psychiatric: Mood and affect normal. Behavior is normal. Judgment and thought content normal.     BMET    Component Value Date/Time   NA 140 05/01/2022 1046   K 4.1 05/01/2022 1046   CL 104 05/01/2022 1046   CO2 25 05/01/2022 1046   GLUCOSE 164 (H) 05/01/2022 1046   BUN 12 05/01/2022 1046   CREATININE 0.85 05/01/2022 1046   CALCIUM 9.3 05/01/2022 1046   GFRNONAA >60 03/07/2022 1853   GFRNONAA 69 10/09/2017 1024   GFRAA >60 11/20/2018 0408   GFRAA 79 10/09/2017 1024    Lipid Panel     Component Value Date/Time   CHOL 340 (H) 05/01/2022 1046   TRIG 295 (H) 05/01/2022 1046   HDL 45 (L) 05/01/2022 1046   CHOLHDL 7.6 (H) 05/01/2022 1046   VLDL 33.0 02/20/2017 0841   LDLCALC 245 (H) 05/01/2022 1046    CBC    Component Value Date/Time   WBC 7.1 05/01/2022 1046   RBC 5.37 (H) 05/01/2022 1046   HGB 14.9 05/01/2022 1046   HCT 44.4 05/01/2022 1046   PLT 490 (H) 05/01/2022 1046   MCV 82.7 05/01/2022 1046   MCH 27.7 05/01/2022 1046   MCHC 33.6 05/01/2022 1046   RDW 13.6 05/01/2022 1046   LYMPHSABS 2.2 07/18/2020 1054   MONOABS 0.8 07/18/2020 1054   EOSABS 0.1 07/18/2020 1054   BASOSABS 0.1 07/18/2020 1054    Hgb A1C Lab Results  Component Value Date   HGBA1C 5.5 10/29/2021           Assessment & Plan:   Preventative health maintenance:  Encouraged her to get a flu shot in the fall She declines tetanus for financial reasons, advised her if she gets bit or cut to go get this done COVID-vaccine UTD Pneumovax and Prevnar UTD Discussed Shingrix vaccine, she will check coverage with her insurance company and schedule visit she would like to have this done She no longer needs screening for cervical cancer given her age  She declines mammogram and bone density Colon screening UTD Encouraged her to consume  a balanced diet and exercise regimen Advised her to see an eye doctor and dentist annually We will check CBC, c-Met, TSH, lipid, A1c  RTC in 6 months, follow-up chronic conditions Nicki Reaper, NP

## 2022-10-31 NOTE — Patient Instructions (Signed)
Health Maintenance for Postmenopausal Women Menopause is a normal process in which your ability to get pregnant comes to an end. This process happens slowly over many months or years, usually between the ages of 48 and 55. Menopause is complete when you have missed your menstrual period for 12 months. It is important to talk with your health care provider about some of the most common conditions that affect women after menopause (postmenopausal women). These include heart disease, cancer, and bone loss (osteoporosis). Adopting a healthy lifestyle and getting preventive care can help to promote your health and wellness. The actions you take can also lower your chances of developing some of these common conditions. What are the signs and symptoms of menopause? During menopause, you may have the following symptoms: Hot flashes. These can be moderate or severe. Night sweats. Decrease in sex drive. Mood swings. Headaches. Tiredness (fatigue). Irritability. Memory problems. Problems falling asleep or staying asleep. Talk with your health care provider about treatment options for your symptoms. Do I need hormone replacement therapy? Hormone replacement therapy is effective in treating symptoms that are caused by menopause, such as hot flashes and night sweats. Hormone replacement carries certain risks, especially as you become older. If you are thinking about using estrogen or estrogen with progestin, discuss the benefits and risks with your health care provider. How can I reduce my risk for heart disease and stroke? The risk of heart disease, heart attack, and stroke increases as you age. One of the causes may be a change in the body's hormones during menopause. This can affect how your body uses dietary fats, triglycerides, and cholesterol. Heart attack and stroke are medical emergencies. There are many things that you can do to help prevent heart disease and stroke. Watch your blood pressure High  blood pressure causes heart disease and increases the risk of stroke. This is more likely to develop in people who have high blood pressure readings or are overweight. Have your blood pressure checked: Every 3-5 years if you are 18-39 years of age. Every year if you are 40 years old or older. Eat a healthy diet  Eat a diet that includes plenty of vegetables, fruits, low-fat dairy products, and lean protein. Do not eat a lot of foods that are high in solid fats, added sugars, or sodium. Get regular exercise Get regular exercise. This is one of the most important things you can do for your health. Most adults should: Try to exercise for at least 150 minutes each week. The exercise should increase your heart rate and make you sweat (moderate-intensity exercise). Try to do strengthening exercises at least twice each week. Do these in addition to the moderate-intensity exercise. Spend less time sitting. Even light physical activity can be beneficial. Other tips Work with your health care provider to achieve or maintain a healthy weight. Do not use any products that contain nicotine or tobacco. These products include cigarettes, chewing tobacco, and vaping devices, such as e-cigarettes. If you need help quitting, ask your health care provider. Know your numbers. Ask your health care provider to check your cholesterol and your blood sugar (glucose). Continue to have your blood tested as directed by your health care provider. Do I need screening for cancer? Depending on your health history and family history, you may need to have cancer screenings at different stages of your life. This may include screening for: Breast cancer. Cervical cancer. Lung cancer. Colorectal cancer. What is my risk for osteoporosis? After menopause, you may be   at increased risk for osteoporosis. Osteoporosis is a condition in which bone destruction happens more quickly than new bone creation. To help prevent osteoporosis or  the bone fractures that can happen because of osteoporosis, you may take the following actions: If you are 19-50 years old, get at least 1,000 mg of calcium and at least 600 international units (IU) of vitamin D per day. If you are older than age 50 but younger than age 70, get at least 1,200 mg of calcium and at least 600 international units (IU) of vitamin D per day. If you are older than age 70, get at least 1,200 mg of calcium and at least 800 international units (IU) of vitamin D per day. Smoking and drinking excessive alcohol increase the risk of osteoporosis. Eat foods that are rich in calcium and vitamin D, and do weight-bearing exercises several times each week as directed by your health care provider. How does menopause affect my mental health? Depression may occur at any age, but it is more common as you become older. Common symptoms of depression include: Feeling depressed. Changes in sleep patterns. Changes in appetite or eating patterns. Feeling an overall lack of motivation or enjoyment of activities that you previously enjoyed. Frequent crying spells. Talk with your health care provider if you think that you are experiencing any of these symptoms. General instructions See your health care provider for regular wellness exams and vaccines. This may include: Scheduling regular health, dental, and eye exams. Getting and maintaining your vaccines. These include: Influenza vaccine. Get this vaccine each year before the flu season begins. Pneumonia vaccine. Shingles vaccine. Tetanus, diphtheria, and pertussis (Tdap) booster vaccine. Your health care provider may also recommend other immunizations. Tell your health care provider if you have ever been abused or do not feel safe at home. Summary Menopause is a normal process in which your ability to get pregnant comes to an end. This condition causes hot flashes, night sweats, decreased interest in sex, mood swings, headaches, or lack  of sleep. Treatment for this condition may include hormone replacement therapy. Take actions to keep yourself healthy, including exercising regularly, eating a healthy diet, watching your weight, and checking your blood pressure and blood sugar levels. Get screened for cancer and depression. Make sure that you are up to date with all your vaccines. This information is not intended to replace advice given to you by your health care provider. Make sure you discuss any questions you have with your health care provider. Document Revised: 09/11/2020 Document Reviewed: 09/11/2020 [employer]vier Patient Education  2024 [employer]  

## 2022-10-31 NOTE — Assessment & Plan Note (Signed)
Encourage diet and exercise for weight loss 

## 2022-11-01 ENCOUNTER — Ambulatory Visit (INDEPENDENT_AMBULATORY_CARE_PROVIDER_SITE_OTHER): Payer: Medicare HMO

## 2022-11-01 VITALS — Ht 66.0 in | Wt 212.0 lb

## 2022-11-01 DIAGNOSIS — Z0000 Encounter for general adult medical examination without abnormal findings: Secondary | ICD-10-CM | POA: Diagnosis not present

## 2022-11-01 NOTE — Progress Notes (Signed)
Subjective:   Michele Newton is a 67 y.o. female who presents for Medicare Annual (Subsequent) preventive examination.  Visit Complete: Virtual  I connected with  Aanya Kofron on 11/01/22 by a audio enabled telemedicine application and verified that I am speaking with the correct person using two identifiers.  Patient Location: Home  Provider Location: Office/Clinic  I discussed the limitations of evaluation and management by telemedicine. The patient expressed understanding and agreed to proceed.   Review of Systems     Cardiac Risk Factors include: advanced age (>28men, >78 women);dyslipidemia;hypertension;obesity (BMI >30kg/m2)     Objective:    Today's Vitals   11/01/22 1302 11/01/22 1316  Weight:  212 lb (96.2 kg)  Height:  5\' 6"  (1.676 m)  PainSc: 3     Body mass index is 34.22 kg/m.     11/01/2022    1:07 PM 03/07/2022    6:51 PM 12/25/2021   10:39 AM 12/14/2021    9:09 AM 09/14/2021    8:45 AM 07/27/2020   11:06 AM 07/18/2020   10:22 AM  Advanced Directives  Does Patient Have a Medical Advance Directive? No No No No No No No  Would patient like information on creating a medical advance directive? No - Patient declined    No - Patient declined No - Patient declined Yes (MAU/Ambulatory/Procedural Areas - Information given)    Current Medications (verified) Outpatient Encounter Medications as of 11/01/2022  Medication Sig   albuterol (PROVENTIL) (2.5 MG/3ML) 0.083% nebulizer solution Take 3 mLs (2.5 mg total) by nebulization every 4 (four) hours as needed for wheezing or shortness of breath.   budesonide-formoterol (SYMBICORT) 160-4.5 MCG/ACT inhaler Inhale 2 puffs into the lungs 2 (two) times daily.   docusate sodium (COLACE) 100 MG capsule Take 1 capsule (100 mg total) by mouth 2 (two) times daily.   fluticasone (FLONASE) 50 MCG/ACT nasal spray Place 2 sprays into both nostrils daily. Use for 4-6 weeks then stop and use seasonally or as needed.   linaclotide (LINZESS)  72 MCG capsule Take 1 capsule (72 mcg total) by mouth daily before breakfast.   montelukast (SINGULAIR) 10 MG tablet TAKE 1 TABLET(10 MG) BY MOUTH AT BEDTIME   Nebulizers (COMPRESSOR/NEBULIZER) MISC 1 Units by Does not apply route every 4 (four) hours as needed.   pantoprazole (PROTONIX) 40 MG tablet TAKE 1 TABLET(40 MG) BY MOUTH TWICE DAILY   PARoxetine (PAXIL) 40 MG tablet Take 1.5 tablets (60 mg total) by mouth every morning.   QUEtiapine (SEROQUEL) 200 MG tablet TAKE 1 TABLET(200 MG) BY MOUTH AT BEDTIME   traZODone (DESYREL) 50 MG tablet TAKE 1/2 TO 1 TABLET(25 TO 50 MG) BY MOUTH AT BEDTIME AS NEEDED FOR SLEEP   verapamil (CALAN-SR) 180 MG CR tablet TAKE 1 TABLET(180 MG) BY MOUTH AT BEDTIME   traMADol (ULTRAM) 50 MG tablet Take 1 tablet (50 mg total) by mouth every 6 (six) hours as needed.   No facility-administered encounter medications on file as of 11/01/2022.    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   Procedure: TOTAL HIP ARTHROPLASTY ANTERIOR APPROACH;  Surgeon: Kennedy Bucker, MD;  Location: [facility];  Service: Orthopedics;  Laterality: Left;   TOTAL HIP ARTHROPLASTY Right 07/27/2020   Procedure: TOTAL HIP ARTHROPLASTY ANTERIOR APPROACH;  Surgeon: Kennedy Bucker, MD;  Location: [facility];  Service: Orthopedics;  Laterality: Right;   TOTAL HIP REVISION Right 12/25/2021   Procedure: Right hip revision, acetabular component  & femoral head;  Surgeon: Kennedy Bucker, MD;  Location: [facility];  Service: Orthopedics;  Laterality: Right;   TUBAL LIGATION     TYMPANOSTOMY TUBE PLACEMENT  1998   VENTRICULAR ATRIAL SHUNT Right 1995   Family History  Problem Relation Age of Onset   Arthritis Mother    Hyperlipidemia Mother    Heart disease Mother 54       Died of heart failure   Hypertension Mother    Diabetes Mother    Depression Mother    Arthritis Father    Cancer Father        prostate cancer   Diabetes Brother    Breast cancer Maternal Grandmother    Colon cancer Neg Hx    Social History   Socioeconomic History   Marital status: Married    Spouse name: Jillyn Hidden   Number of children: 2   Years of education: 14   Highest education level: Associate degree: occupational, Scientist, product/process development, or vocational program  Occupational History    Employer: OTHER  Tobacco Use   Smoking status: Never   Smokeless tobacco: Never  Vaping Use   Vaping Use: Never used  Substance and Sexual Activity   Alcohol use: No   Drug use: No   Sexual activity: Not Currently    Birth control/protection: Surgical  Other Topics Concern   Not on file  Social History Narrative   Iyannah lives at home with her husband, Jillyn Hidden, of 5 months and a cat named, Puff. She has 2 sons from a previous marriage. She has 4 grandchildren.    Social Determinants of Health   Financial Resource Strain: Low Risk  (11/01/2022)   Overall Financial Resource Strain (CARDIA)    Difficulty of Paying Living Expenses: Not hard at all  Food Insecurity: No Food Insecurity (11/01/2022)   Hunger Vital Sign    Worried About Running Out of Food in the Last Year: Never true    Ran Out of Food in the Last Year: Never true  Transportation Needs: No Transportation Needs (11/01/2022)   PRAPARE - Administrator, Civil Service (Medical): No    Lack of Transportation (Non-Medical): No  Physical Activity: Inactive (11/01/2022)   Exercise Vital Sign    Days of Exercise  per Week: 0 days    Minutes of Exercise per Session: 0 min  Stress: No Stress Concern Present (11/01/2022)   [employer] of Occupational Health - Occupational Stress Questionnaire    Feeling of Stress : Only a little  Recent Concern: Stress - Stress Concern Present (08/23/2022)   [employer] of Occupational Health - Occupational Stress Questionnaire    Feeling of Stress : Very much  Social Connections: Socially Integrated (11/01/2022)   Social Connection and Isolation Panel [NHANES]    Frequency of Communication with Friends and Family: More than three times a week    Frequency of Social Gatherings  with Friends and Family: Never    Attends Religious Services: More than 4 times per year    Active Member of Clubs or Organizations: Yes    Attends Engineer, structural: More than 4 times per year    Marital Status: Married    Tobacco Counseling Counseling given: Not Answered   Clinical Intake:  Pre-visit preparation completed: Yes  Pain : 0-10 Pain Score: 3  Pain Type: Chronic pain Pain Location: Back     Nutritional Risks: None Diabetes: No  How often do you need to have someone help you when you read instructions, pamphlets, or other written materials from your doctor or pharmacy?: 1 - Never  Interpreter Needed?: No  Information entered by :: Kennedy Bucker, LPN   Activities of Daily Living    11/01/2022    1:08 PM 10/31/2022    9:22 AM  In your present state of health, do you have any difficulty performing the following activities:  Hearing? 0 1  Vision? 0 1  Difficulty concentrating or making decisions? 0 0  Walking or climbing stairs? 1 1  Comment hips   Dressing or bathing? 0 0  Doing errands, shopping? 0 0  Preparing Food and eating ? N   Using the Toilet? N   In the past six months, have you accidently leaked urine? N   Do you have problems with loss of bowel control? N   Managing your Medications? N   Managing your Finances? N    Housekeeping or managing your Housekeeping? N     Patient Care Team: Lorre Munroe, NP as PCP - General (Internal Medicine)  Indicate any recent Medical Services you may have received from other than [facility] providers in the past year (date may be approximate).     Assessment:   This is a routine wellness examination for Jonay.  Hearing/Vision screen Hearing Screening - Comments:: Wears aids  Vision Screening - Comments:: Wears glasses- Dr. Clydene Pugh  Dietary issues and exercise activities discussed:     Goals Addressed             This Visit's Progress    DIET - INCREASE WATER INTAKE         Depression Screen    11/01/2022    1:05 PM 10/31/2022    9:21 AM 09/26/2022    1:48 PM 08/23/2022    2:05 PM 06/14/2022   10:39 AM 01/09/2022   10:36 AM 10/29/2021    9:41 AM  PHQ 2/9 Scores  PHQ - 2 Score 2 2 2 4  0 0 2  PHQ- 9 Score 4 6 4 12   0 10    Fall Risk    11/01/2022    1:07 PM 10/31/2022    9:22 AM 10/28/2022    2:36 PM 09/26/2022    1:48 PM 08/23/2022    2:06 PM  Fall Risk   Falls in the past year? 0 0 0 0 0  Number falls in past yr: 0    0  Injury with Fall? 0 0 0 0 0  Risk for fall due to : No Fall Risks No Fall Risks  No Fall Risks No Fall Risks  Follow up Falls prevention discussed;Falls evaluation completed    Falls evaluation completed    MEDICARE RISK AT HOME:  Medicare Risk at Home - 11/01/22 1308     Any stairs in or around the home? Yes    If so, are there any without handrails? No  Home free of loose throw rugs in walkways, pet beds, electrical cords, etc? Yes    Adequate lighting in your home to reduce risk of falls? Yes    Life alert? No    Use of a cane, walker or w/c? Yes   cane sometimes   Grab bars in the bathroom? Yes    Shower chair or bench in shower? Yes    Elevated toilet seat or a handicapped toilet? Yes             TIMED UP AND GO:  Was the test performed?  No    Cognitive Function:        11/01/2022    1:09 PM  6CIT  Screen  What Year? 0 points  What month? 0 points  What time? 0 points  Count back from 20 0 points  Months in reverse 0 points  Repeat phrase 0 points  Total Score 0 points    Immunizations Immunization History  Administered Date(s) Administered   Fluad Quad(high Dose 65+) 04/23/2021   Influenza,inj,Quad PF,6+ Mos 02/06/2018   Influenza,inj,quad, With Preservative 03/06/2018   PFIZER(Purple Top)SARS-COV-2 Vaccination 07/04/2019, 07/26/2019   PNEUMOCOCCAL CONJUGATE-20 06/14/2022   Pneumococcal Polysaccharide-23 04/23/2021   Td 01/04/2010    TDAP status: Due, Education has been provided regarding the importance of this vaccine. Advised may receive this vaccine at local pharmacy or Health Dept. Aware to provide a copy of the vaccination record if obtained from local pharmacy or Health Dept. Verbalized acceptance and understanding.  Flu Vaccine status: Declined, Education has been provided regarding the importance of this vaccine but patient still declined. Advised may receive this vaccine at local pharmacy or Health Dept. Aware to provide a copy of the vaccination record if obtained from local pharmacy or Health Dept. Verbalized acceptance and understanding.  Pneumococcal vaccine status: Up to date  Covid-19 vaccine status: Completed vaccines  Qualifies for Shingles Vaccine? Yes   Zostavax completed No   Shingrix Completed?: No.    Education has been provided regarding the importance of this vaccine. Patient has been advised to call insurance company to determine out of pocket expense if they have not yet received this vaccine. Advised may also receive vaccine at local pharmacy or Health Dept. Verbalized acceptance and understanding.  Screening Tests Health Maintenance  Topic Date Due   Zoster Vaccines- Shingrix (1 of 2) Never done   DTaP/Tdap/Td (2 - Tdap) 01/05/2020   MAMMOGRAM  10/31/2023 (Originally 04/10/2005)   DEXA SCAN  10/31/2023 (Originally 04/10/2020)   INFLUENZA  VACCINE  12/05/2022   Medicare Annual Wellness (AWV)  11/01/2023   Fecal DNA (Cologuard)  11/08/2024   Pneumonia Vaccine 88+ Years old  Completed   Hepatitis C Screening  Completed   HPV VACCINES  Aged Out   COVID-19 Vaccine  Discontinued    Health Maintenance  Health Maintenance Due  Topic Date Due   Zoster Vaccines- Shingrix (1 of 2) Never done   DTaP/Tdap/Td (2 - Tdap) 01/05/2020    Colorectal cancer screening: Type of screening: Cologuard. Completed 11/08/21. Repeat every 3 years  Declined referral mammogram, BDS   Lung Cancer Screening: (Low Dose CT Chest recommended if Age 60-80 years, 20 pack-year currently smoking OR have quit w/in 15years.) does not qualify.    Additional Screening:  Hepatitis C Screening: does qualify; Completed 10/29/21  Vision Screening: Recommended annual ophthalmology exams for early detection of glaucoma and other disorders of the eye. Is the patient up to date with their annual eye exam?  Yes  Who is the provider or what is the name of the office in which the patient attends annual eye exams? Dr.Woodard If pt is not established with a provider, would they like to be referred to a provider to establish care? No .   Dental Screening: Recommended annual dental exams for proper oral hygiene   Community Resource Referral / Chronic Care Management: CRR required this visit?  No   CCM required this visit?  No     Plan:     I have personally reviewed and noted the following in the patient's chart:   Medical and social history Use of alcohol, tobacco or illicit drugs  Current medications and supplements including opioid prescriptions. Patient is not currently taking opioid prescriptions. Functional ability and status Nutritional status Physical activity Advanced directives List of other physicians Hospitalizations, surgeries, and ER visits in previous 12 months Vitals Screenings to include cognitive, depression, and falls Referrals and  appointments  In addition, I have reviewed and discussed with patient certain preventive protocols, quality metrics, and best practice recommendations. A written personalized care plan for preventive services as well as general preventive health recommendations were provided to patient.     Hal Hope, LPN   1/61/0960   After Visit Summary: (MyChart) Due to this being a telephonic visit, the after visit summary with patients personalized plan was offered to patient via MyChart   Nurse Notes: none

## 2022-11-01 NOTE — Patient Instructions (Signed)
[name] , Thank you for taking time to come for your Medicare Wellness Visit. I appreciate your ongoing commitment to your health goals. Please review the following plan we discussed and let me know if I can assist you in the future.   These are the goals we discussed:  Goals      DIET - EAT MORE FRUITS AND VEGETABLES     DIET - INCREASE WATER INTAKE        This is a list of the screening recommended for you and due dates:  Health Maintenance  Topic Date Due   Zoster (Shingles) Vaccine (1 of 2) Never done   DTaP/Tdap/Td vaccine (2 - Tdap) 01/05/2020   Mammogram  10/31/2023*   DEXA scan (bone density measurement)  10/31/2023*   Flu Shot  12/05/2022   Medicare Annual Wellness Visit  11/01/2023   Cologuard (Stool DNA test)  11/08/2024   Pneumonia Vaccine  Completed   Hepatitis C Screening  Completed   HPV Vaccine  Aged Out   COVID-19 Vaccine  Discontinued  *Topic was postponed. The date shown is not the original due date.    Advanced directives: no  Conditions/risks identified: none  Next appointment: Follow up in one year for your annual wellness visit 11/13/23 @ 3:00 pm by phone   Preventive Care 65 Years and Older, Female Preventive care refers to lifestyle choices and visits with your health care provider that can promote health and wellness. What does preventive care include? A yearly physical exam. This is also called an annual well check. Dental exams once or twice a year. Routine eye exams. Ask your health care provider how often you should have your eyes checked. Personal lifestyle choices, including: Daily care of your teeth and gums. Regular physical activity. Eating a healthy diet. Avoiding tobacco and drug use. Limiting alcohol use. Practicing safe sex. Taking low-dose aspirin every day. Taking vitamin and mineral supplements as recommended by your health care provider. What happens during an annual well check? The services and screenings done by your  health care provider during your annual well check will depend on your age, overall health, lifestyle risk factors, and family history of disease. Counseling  Your health care provider may ask you questions about your: Alcohol use. Tobacco use. Drug use. Emotional well-being. Home and relationship well-being. Sexual activity. Eating habits. History of falls. Memory and ability to understand (cognition). Work and work Astronomer. Reproductive health. Screening  You may have the following tests or measurements: Height, weight, and BMI. Blood pressure. Lipid and cholesterol levels. These may be checked every 5 years, or more frequently if you are over 68 years old. Skin check. Lung cancer screening. You may have this screening every year starting at age 68 if you have a 30-pack-year history of smoking and currently smoke or have quit within the past 15 years. Fecal occult blood test (FOBT) of the stool. You may have this test every year starting at age 68. Flexible sigmoidoscopy or colonoscopy. You may have a sigmoidoscopy every 5 years or a colonoscopy every 10 years starting at age 68. Hepatitis C blood test. Hepatitis B blood test. Sexually transmitted disease (STD) testing. Diabetes screening. This is done by checking your blood sugar (glucose) after you have not eaten for a while (fasting). You may have this done every 1-3 years. Bone density scan. This is done to screen for osteoporosis. You may have this done starting at age 68. Mammogram. This may be done every 1-2 years. Talk  to your health care provider about how often you should have regular mammograms. Talk with your health care provider about your test results, treatment options, and if necessary, the need for more tests. Vaccines  Your health care provider may recommend certain vaccines, such as: Influenza vaccine. This is recommended every year. Tetanus, diphtheria, and acellular pertussis (Tdap, Td) vaccine. You may  need a Td booster every 10 years. Zoster vaccine. You may need this after age 68. Pneumococcal 13-valent conjugate (PCV13) vaccine. One dose is recommended after age 68. Pneumococcal polysaccharide (PPSV23) vaccine. One dose is recommended after age 68. Talk to your health care provider about which screenings and vaccines you need and how often you need them. This information is not intended to replace advice given to you by your health care provider. Make sure you discuss any questions you have with your health care provider. Document Released: 05/19/2015 Document Revised: 01/10/2016 Document Reviewed: 02/21/2015 Elsevier Interactive Patient Education  2017 [employer].  Fall Prevention in the Home Falls can cause injuries. They can happen to people of all ages. There are many things you can do to make your home safe and to help prevent falls. What can I do on the outside of my home? Regularly fix the edges of walkways and driveways and fix any cracks. Remove anything that might make you trip as you walk through a door, such as a raised step or threshold. Trim any bushes or trees on the path to your home. Use bright outdoor lighting. Clear any walking paths of anything that might make someone trip, such as rocks or tools. Regularly check to see if handrails are loose or broken. Make sure that both sides of any steps have handrails. Any raised decks and porches should have guardrails on the edges. Have any leaves, snow, or ice cleared regularly. Use sand or salt on walking paths during winter. Clean up any spills in your garage right away. This includes oil or grease spills. What can I do in the bathroom? Use night lights. Install grab bars by the toilet and in the tub and shower. Do not use towel bars as grab bars. Use non-skid mats or decals in the tub or shower. If you need to sit down in the shower, use a plastic, non-slip stool. Keep the floor dry. Clean up any water that spills on  the floor as soon as it happens. Remove soap buildup in the tub or shower regularly. Attach bath mats securely with double-sided non-slip rug tape. Do not have throw rugs and other things on the floor that can make you trip. What can I do in the bedroom? Use night lights. Make sure that you have a light by your bed that is easy to reach. Do not use any sheets or blankets that are too big for your bed. They should not hang down onto the floor. Have a firm chair that has side arms. You can use this for support while you get dressed. Do not have throw rugs and other things on the floor that can make you trip. What can I do in the kitchen? Clean up any spills right away. Avoid walking on wet floors. Keep items that you use a lot in easy-to-reach places. If you need to reach something above you, use a strong step stool that has a grab bar. Keep electrical cords out of the way. Do not use floor polish or wax that makes floors slippery. If you must use wax, use non-skid floor wax. Do  not have throw rugs and other things on the floor that can make you trip. What can I do with my stairs? Do not leave any items on the stairs. Make sure that there are handrails on both sides of the stairs and use them. Fix handrails that are broken or loose. Make sure that handrails are as long as the stairways. Check any carpeting to make sure that it is firmly attached to the stairs. Fix any carpet that is loose or worn. Avoid having throw rugs at the top or bottom of the stairs. If you do have throw rugs, attach them to the floor with carpet tape. Make sure that you have a light switch at the top of the stairs and the bottom of the stairs. If you do not have them, ask someone to add them for you. What else can I do to help prevent falls? Wear shoes that: Do not have high heels. Have rubber bottoms. Are comfortable and fit you well. Are closed at the toe. Do not wear sandals. If you use a stepladder: Make sure  that it is fully opened. Do not climb a closed stepladder. Make sure that both sides of the stepladder are locked into place. Ask someone to hold it for you, if possible. Clearly mark and make sure that you can see: Any grab bars or handrails. First and last steps. Where the edge of each step is. Use tools that help you move around (mobility aids) if they are needed. These include: Canes. Walkers. Scooters. Crutches. Turn on the lights when you go into a dark area. Replace any light bulbs as soon as they burn out. Set up your furniture so you have a clear path. Avoid moving your furniture around. If any of your floors are uneven, fix them. If there are any pets around you, be aware of where they are. Review your medicines with your doctor. Some medicines can make you feel dizzy. This can increase your chance of falling. Ask your doctor what other things that you can do to help prevent falls. This information is not intended to replace advice given to you by your health care provider. Make sure you discuss any questions you have with your health care provider. Document Released: 02/16/2009 Document Revised: 09/28/2015 Document Reviewed: 05/27/2014 Elsevier Interactive Patient Education  2017 [employer].

## 2022-11-27 DIAGNOSIS — J302 Other seasonal allergic rhinitis: Secondary | ICD-10-CM | POA: Diagnosis not present

## 2022-11-27 DIAGNOSIS — H7201 Central perforation of tympanic membrane, right ear: Secondary | ICD-10-CM | POA: Diagnosis not present

## 2022-11-29 DIAGNOSIS — U071 COVID-19: Secondary | ICD-10-CM | POA: Diagnosis not present

## 2022-11-29 DIAGNOSIS — Z03818 Encounter for observation for suspected exposure to other biological agents ruled out: Secondary | ICD-10-CM | POA: Diagnosis not present

## 2022-11-29 DIAGNOSIS — B9689 Other specified bacterial agents as the cause of diseases classified elsewhere: Secondary | ICD-10-CM | POA: Diagnosis not present

## 2022-11-29 DIAGNOSIS — J209 Acute bronchitis, unspecified: Secondary | ICD-10-CM | POA: Diagnosis not present

## 2022-11-29 DIAGNOSIS — J019 Acute sinusitis, unspecified: Secondary | ICD-10-CM | POA: Diagnosis not present

## 2022-12-10 ENCOUNTER — Other Ambulatory Visit: Payer: Self-pay | Admitting: Internal Medicine

## 2022-12-10 DIAGNOSIS — K219 Gastro-esophageal reflux disease without esophagitis: Secondary | ICD-10-CM

## 2022-12-10 DIAGNOSIS — F5104 Psychophysiologic insomnia: Secondary | ICD-10-CM

## 2022-12-10 DIAGNOSIS — J4520 Mild intermittent asthma, uncomplicated: Secondary | ICD-10-CM

## 2022-12-10 NOTE — Telephone Encounter (Signed)
Requested Prescriptions  Pending Prescriptions Disp Refills   pantoprazole (PROTONIX) 40 MG tablet [Pharmacy Med Name: PANTOPRAZOLE 40MG  TABLETS] 180 tablet 3    Sig: TAKE 1 TABLET(40 MG) BY MOUTH TWICE DAILY     Gastroenterology: Proton Pump Inhibitors Passed - 12/10/2022  7:02 AM      Passed - Valid encounter within last 12 months    Recent Outpatient Visits           1 month ago Encounter for general adult medical examination with abnormal findings   [facility] [place], Salvadore Oxford, NP   2 months ago Irritable bowel syndrome with both constipation and diarrhea   [facility] [place], [place] W, NP   3 months ago Acute non-recurrent frontal sinusitis   [place] [place], Netta Neat, DO   3 months ago Seasonal allergic rhinitis due to pollen   [facility] [place], Salvadore Oxford, NP   5 months ago Burning with urination   [facility] [place], Salvadore Oxford, NP       Future Appointments             In 4 months Reynolds, Salvadore Oxford, NP [place] [facility]             verapamil (CALAN-SR) 180 MG CR tablet [Pharmacy Med Name: VERAPAMIL ER 180MG  TABLETS] 90 tablet 1    Sig: TAKE 1 TABLET(180 MG) BY MOUTH AT BEDTIME     Cardiovascular: Calcium Channel Blockers 3 Failed - 12/10/2022  7:02 AM      Failed - ALT in normal range and within 360 days    ALT  Date Value Ref Range Status  05/01/2022 30 (H) 6 - 29 U/L Final         Passed - AST in normal range and within 360 days    AST  Date Value Ref Range Status  05/01/2022 22 10 - 35 U/L Final         Passed - Cr in normal range and within 360 days    Creat  Date Value Ref Range Status  05/01/2022 0.85 0.50 - 1.05 mg/dL Final         Passed - Last BP in normal range    BP Readings from Last 1 Encounters:  10/31/22 128/72         Passed - Last Heart Rate in normal range    Pulse  Readings from Last 1 Encounters:  10/31/22 79         Passed - Valid encounter within last 6 months    Recent Outpatient Visits           1 month ago Encounter for general adult medical examination with abnormal findings   [place] [place], [place] W, NP   2 months ago Irritable bowel syndrome with both constipation and diarrhea   [facility] [place], [place], NP   3 months ago Acute non-recurrent frontal sinusitis   [place] [facility] [place], Netta Neat, DO   3 months ago Seasonal allergic rhinitis due to pollen   [facility] [place], Salvadore Oxford, NP   5 months ago Burning with urination    [facility] [place], Salvadore Oxford, NP       Future Appointments  In 4 months Baity, Salvadore Oxford, NP [facility]             traZODone (DESYREL) 50 MG tablet [Pharmacy Med Name: TRAZODONE 50MG  TABLETS] 90 tablet 1    Sig: TAKE 1/2 TO 1 TABLET(25 TO 50 MG) BY MOUTH AT BEDTIME AS NEEDED FOR SLEEP     Psychiatry: Antidepressants - Serotonin Modulator Passed - 12/10/2022  7:02 AM      Passed - Completed PHQ-2 or PHQ-9 in the last 360 days      Passed - Valid encounter within last 6 months    Recent Outpatient Visits           1 month ago Encounter for general adult medical examination with abnormal findings   [facility] [place], [place], NP   2 months ago Irritable bowel syndrome with both constipation and diarrhea   [place] [place], [place] W, NP   3 months ago Acute non-recurrent frontal sinusitis   [place] [facility] [place], Netta Neat, DO   3 months ago Seasonal allergic rhinitis due to pollen   [facility] [place], Salvadore Oxford, NP   5 months ago Burning with urination   [place] [facility]  [place], Salvadore Oxford, NP       Future Appointments             In 4 months Baity, Salvadore Oxford, NP [place] [facility]             montelukast (SINGULAIR) 10 MG tablet [Pharmacy Med Name: MONTELUKAST 10MG  TABLETS] 90 tablet 3    Sig: TAKE 1 TABLET(10 MG) BY MOUTH AT BEDTIME     Pulmonology:  Leukotriene Inhibitors Passed - 12/10/2022  7:02 AM      Passed - Valid encounter within last 12 months    Recent Outpatient Visits           1 month ago Encounter for general adult medical examination with abnormal findings   [facility] [place], [place], NP   2 months ago Irritable bowel syndrome with both constipation and diarrhea   [facility] [place], [place], NP   3 months ago Acute non-recurrent frontal sinusitis   [facility] [place], Netta Neat, DO   3 months ago Seasonal allergic rhinitis due to pollen   [facility] [place], Salvadore Oxford, NP   5 months ago Burning with urination   [facility] [place], Salvadore Oxford, NP       Future Appointments             In 4 months Baity, Salvadore Oxford, NP [place] [facility]

## 2022-12-21 ENCOUNTER — Other Ambulatory Visit: Payer: Self-pay | Admitting: Internal Medicine

## 2022-12-23 NOTE — Telephone Encounter (Signed)
Requested Prescriptions  Pending Prescriptions Disp Refills   LINZESS 72 MCG capsule [Pharmacy Med Name: Karlene Einstein CAPSULES] 90 capsule 0    Sig: TAKE 1 CAPSULE(72 MCG) BY MOUTH DAILY BEFORE BREAKFAST     Gastroenterology: Irritable Bowel Syndrome Passed - 12/21/2022 11:47 AM      Passed - Valid encounter within last 12 months    Recent Outpatient Visits           1 month ago Encounter for general adult medical examination with abnormal findings   [facility] [place], [place], NP   2 months ago Irritable bowel syndrome with both constipation and diarrhea   [place] [facility] [place], [place], NP   4 months ago Acute non-recurrent frontal sinusitis   [facility] [place], Netta Neat, DO   4 months ago Seasonal allergic rhinitis due to pollen   [facility] [place], Salvadore Oxford, NP   6 months ago Burning with urination   [place] [facility] [place], Salvadore Oxford, NP       Future Appointments             In 4 months Baity, Salvadore Oxford, NP [place] [facility]

## 2023-01-13 ENCOUNTER — Other Ambulatory Visit: Payer: Self-pay | Admitting: Internal Medicine

## 2023-01-13 ENCOUNTER — Encounter: Payer: Self-pay | Admitting: Internal Medicine

## 2023-01-13 DIAGNOSIS — G4709 Other insomnia: Secondary | ICD-10-CM

## 2023-01-14 MED ORDER — QUETIAPINE FUMARATE 200 MG PO TABS: 200.0000 mg | ORAL_TABLET | Freq: Every day | ORAL | 1 refills | Status: DC

## 2023-01-14 MED ORDER — VERAPAMIL HCL ER 180 MG PO TBCR: 180.0000 mg | EXTENDED_RELEASE_TABLET | Freq: Every day | ORAL | 1 refills | Status: DC

## 2023-01-14 NOTE — Telephone Encounter (Signed)
Requested medications are due for refill today.  no  Requested medications are on the active medications list.  yes  Last refill. Today  Future visit scheduled.   yes  Notes to clinic.  Refill/refusal not delegated.    Requested Prescriptions  Pending Prescriptions Disp Refills   QUEtiapine (SEROQUEL) 200 MG tablet [Pharmacy Med Name: QUETIAPINE 200MG  TABLETS] 90 tablet 0    Sig: TAKE 1 TABLET(200 MG) BY MOUTH AT BEDTIME     Not Delegated - Psychiatry:  Antipsychotics - Second Generation (Atypical) - quetiapine Failed - 01/13/2023 12:06 PM      Failed - This refill cannot be delegated      Failed - TSH in normal range and within 360 days    TSH  Date Value Ref Range Status  04/23/2021 1.21 0.40 - 4.50 mIU/L Final         Failed - Lipid Panel in normal range within the last 12 months    Cholesterol  Date Value Ref Range Status  05/01/2022 340 (H) <200 mg/dL Final   LDL Cholesterol (Calc)  Date Value Ref Range Status  05/01/2022 245 (H) mg/dL (calc) Final    Comment:    LDL-C levels > or = 190 mg/dL may indicate familial  hypercholesterolemia (FH). Clinical assessment and  measurement of blood lipid levels should be  considered for all first degree relatives of patients with an FH diagnosis. LDL Cholesterol (LDL-C) levels > or = 300 mg/dL may indicate homozygous familial hypercholesterolemia (HoFH). Untreated,  these extremely high LDL-C levels can result in premature CV events and mortality. Patients should be identified early and provided appropriate interventions to reduce the cumulative LDL-C burden from birth. . For questions about testing for familial hypercholesterolemia, please call Engineer, materials at 1.866.GENE.INFO. Wardell Honour, et al. J National Lipid Association  Recommendations for Patient-Centered Management of Dyslipidemia: Part 1 Journal of  Clinical Lipidology 2015;9(2), 129-169. Cuchel, M. et al. (2014). Homozygous  familial hypercholesterolaemia: new insights and guidance for clinicians to improve detection and clinical management. European Heart Journal, 35(32), [phone]. Reference range: <100 . Desirable range <100 mg/dL for primary prevention;   <70 mg/dL for patients with CHD or diabetic patients  with > or = 2 CHD risk factors. Marland Kitchen LDL-C is now calculated using the Martin-Hopkins  calculation, which is a validated novel method providing  better accuracy than the Friedewald equation in the  estimation of LDL-C.  Horald Pollen et al. Lenox Ahr. 5621;308(65): 2061-2068  (http://education.QuestDiagnostics.com/faq/FAQ164)    HDL  Date Value Ref Range Status  05/01/2022 45 (L) > OR = 50 mg/dL Final   Triglycerides  Date Value Ref Range Status  05/01/2022 295 (H) <150 mg/dL Final    Comment:    . If a non-fasting specimen was collected, consider repeat triglyceride testing on a fasting specimen if clinically indicated.  Perry Mount et al. J. of Clin. Lipidol. 2015;9:129-169. .          Failed - CBC within normal limits and completed in the last 12 months    WBC  Date Value Ref Range Status  05/01/2022 7.1 3.8 - 10.8 Thousand/uL Final   RBC  Date Value Ref Range Status  05/01/2022 5.37 (H) 3.80 - 5.10 Million/uL Final   Hemoglobin  Date Value Ref Range Status  05/01/2022 14.9 11.7 - 15.5 g/dL Final   HCT  Date Value Ref Range Status  05/01/2022 44.4 35.0 - 45.0 % Final   MCHC  Date Value Ref Range Status  05/01/2022 33.6 32.0 - 36.0 g/dL Final   [facility]  Date Value Ref Range Status  05/01/2022 27.7 27.0 - 33.0 pg Final   MCV  Date Value Ref Range Status  05/01/2022 82.7 80.0 - 100.0 fL Final   No results found for: "PLTCOUNTKUC", "LABPLAT", "POCPLA" RDW  Date Value Ref Range Status  05/01/2022 13.6 11.0 - 15.0 % Final         Passed - Completed PHQ-2 or PHQ-9 in the last 360 days      Passed - Last BP in normal range    BP Readings from Last 1 Encounters:  10/31/22  128/72         Passed - Last Heart Rate in normal range    Pulse Readings from Last 1 Encounters:  10/31/22 79         Passed - Valid encounter within last 6 months    Recent Outpatient Visits           2 months ago Encounter for general adult medical examination with abnormal findings   [facility] [place], [place], NP   3 months ago Irritable bowel syndrome with both constipation and diarrhea   [place] [facility] [place], [place], NP   4 months ago Acute non-recurrent frontal sinusitis   [place] [place], Netta Neat, DO   5 months ago Seasonal allergic rhinitis due to pollen   [facility] [place], Salvadore Oxford, NP   7 months ago Burning with urination   [facility] [place], Salvadore Oxford, NP       Future Appointments             In 3 months [place], Salvadore Oxford, NP [place]            Passed - CMP within normal limits and completed in the last 12 months    Albumin  Date Value Ref Range Status  03/07/2022 4.1 3.5 - 5.0 g/dL Final   Alkaline Phosphatase  Date Value Ref Range Status  03/07/2022 103 38 - 126 U/L Final   Alkaline phosphatase (APISO)  Date Value Ref Range Status  05/01/2022 108 37 - 153 U/L Final   ALT  Date Value Ref Range Status  05/01/2022 30 (H) 6 - 29 U/L Final   AST  Date Value Ref Range Status  05/01/2022 22 10 - 35 U/L Final   BUN  Date Value Ref Range Status  05/01/2022 12 7 - 25 mg/dL Final   Calcium  Date Value Ref Range Status  05/01/2022 9.3 8.6 - 10.4 mg/dL Final   CO2  Date Value Ref Range Status  05/01/2022 25 20 - 32 mmol/L Final   Creat  Date Value Ref Range Status  05/01/2022 0.85 0.50 - 1.05 mg/dL Final   Glucose, Bld  Date Value Ref Range Status  05/01/2022 164 (H) 65 - 99 mg/dL Final    Comment:    .            Fasting reference interval . For someone  without known diabetes, a glucose value >125 mg/dL indicates that they may have diabetes and this should be confirmed with a follow-up test. .    Potassium  Date Value Ref Range Status  05/01/2022 4.1 3.5 - 5.3 mmol/L Final   Sodium  Date Value Ref Range Status  05/01/2022 140 135 - 146  mmol/L Final   Total Bilirubin  Date Value Ref Range Status  05/01/2022 0.3 0.2 - 1.2 mg/dL Final   Protein, ur  Date Value Ref Range Status  12/14/2021 NEGATIVE NEGATIVE mg/dL Final   Protein, UA  Date Value Ref Range Status  06/14/2022 Negative Negative Final   Total Protein  Date Value Ref Range Status  05/01/2022 6.9 6.1 - 8.1 g/dL Final   GFR, Est African American  Date Value Ref Range Status  10/09/2017 79 > OR = 60 mL/min/1.17m2 Final   GFR calc Af Amer  Date Value Ref Range Status  11/20/2018 >60 >60 mL/min Final   GFR  Date Value Ref Range Status  02/20/2017 76.18 >60.00 mL/min Final   eGFR  Date Value Ref Range Status  05/01/2022 75 > OR = 60 mL/min/1.14m2 Final   GFR, Est Non African American  Date Value Ref Range Status  10/09/2017 69 > OR = 60 mL/min/1.71m2 Final   GFR, Estimated  Date Value Ref Range Status  03/07/2022 >60 >60 mL/min Final    Comment:    (NOTE) Calculated using the CKD-EPI Creatinine Equation (2021)

## 2023-01-15 ENCOUNTER — Telehealth: Payer: Self-pay | Admitting: Internal Medicine

## 2023-01-15 NOTE — Telephone Encounter (Signed)
Medication Refill - Medication: Verapamil (CALAN-SR) 180 MG CR tablet  Has the patient contacted their pharmacy? No.  Preferred Pharmacy (with phone number or street name):  [id number] - [place], [place] - [address] AT [facility] [address] [facility] Phone: [phone]  Fax: [phone]     Has the patient been seen for an appointment in the last year OR does the patient have an upcoming appointment? Yes.    Agent: Please be advised that RX refills may take up to 3 business days. We ask that you follow-up with your pharmacy.  Patient stated she took her last pill today

## 2023-01-15 NOTE — Telephone Encounter (Signed)
Patient called, left detailed VM the verapamil was sent to [facility] on 01/14/23, call office back if it's not at the pharmacy.

## 2023-01-22 DIAGNOSIS — H903 Sensorineural hearing loss, bilateral: Secondary | ICD-10-CM | POA: Diagnosis not present

## 2023-01-22 DIAGNOSIS — J302 Other seasonal allergic rhinitis: Secondary | ICD-10-CM | POA: Diagnosis not present

## 2023-01-22 DIAGNOSIS — R519 Headache, unspecified: Secondary | ICD-10-CM | POA: Diagnosis not present

## 2023-01-22 DIAGNOSIS — H6062 Unspecified chronic otitis externa, left ear: Secondary | ICD-10-CM | POA: Diagnosis not present

## 2023-01-23 ENCOUNTER — Other Ambulatory Visit: Payer: Self-pay | Admitting: Student

## 2023-01-23 DIAGNOSIS — H903 Sensorineural hearing loss, bilateral: Secondary | ICD-10-CM

## 2023-02-19 ENCOUNTER — Telehealth: Payer: Self-pay | Admitting: Family Medicine

## 2023-02-19 ENCOUNTER — Encounter: Payer: Self-pay | Admitting: Family Medicine

## 2023-02-19 ENCOUNTER — Ambulatory Visit
Admission: RE | Admit: 2023-02-19 | Discharge: 2023-02-19 | Disposition: A | Discharge status: Home / Self Care | Payer: Medicare HMO | Source: Ambulatory Visit | Attending: Family Medicine | Admitting: Family Medicine

## 2023-02-19 ENCOUNTER — Ambulatory Visit: Payer: Medicare HMO | Admitting: Family Medicine

## 2023-02-19 VITALS — BP 114/70 | HR 80 | Ht 66.0 in | Wt 212.0 lb

## 2023-02-19 DIAGNOSIS — Z982 Presence of cerebrospinal fluid drainage device: Secondary | ICD-10-CM

## 2023-02-19 DIAGNOSIS — R519 Headache, unspecified: Secondary | ICD-10-CM | POA: Diagnosis not present

## 2023-02-19 DIAGNOSIS — G44209 Tension-type headache, unspecified, not intractable: Secondary | ICD-10-CM

## 2023-02-19 DIAGNOSIS — G43909 Migraine, unspecified, not intractable, without status migrainosus: Secondary | ICD-10-CM

## 2023-02-19 DIAGNOSIS — T8509XA Other mechanical complication of ventricular intracranial (communicating) shunt, initial encounter: Secondary | ICD-10-CM

## 2023-02-19 DIAGNOSIS — I6782 Cerebral ischemia: Secondary | ICD-10-CM | POA: Diagnosis not present

## 2023-02-19 MED ORDER — GADOBUTROL 1 MMOL/ML IV SOLN
10.0000 mL | Freq: Once | INTRAVENOUS | Status: AC | PRN
  Administered 2023-02-19: 10 mL via INTRAVENOUS

## 2023-02-19 MED ORDER — TRAMADOL HCL 50 MG PO TABS
50.0000 mg | ORAL_TABLET | Freq: Four times a day (QID) | ORAL | 0 refills | Status: DC | PRN
Start: 2023-02-19 — End: 2023-02-27

## 2023-02-19 MED ORDER — RIZATRIPTAN BENZOATE 10 MG PO TBDP: ORAL_TABLET | ORAL | 0 refills | Status: DC

## 2023-02-19 NOTE — Telephone Encounter (Signed)
Patient seen earlier today 10/16 for headaches.  Please see my full note for details  Quick update. I am waiting on the STAT MRI Brain that was completed earlier today 10/16. Exam is done but pending the radiology reading now.  I called patient to notify her we are still waiting and it may not be back tonight.  She tried the Nurtec ODT and her headache did improve.  She picked up the Maxalt ODT and also has 1 pill of Ubrelvy to try as well.  She will take these meds and let us know how they go as we wait on MRI result.  Once MRI is available, I told her that we would next likely pursue an URGENT Neurosurgery referral if there is a problem with her ventricular shunt or other structural issue.  If there is no problem with shunt, we may then opt to do an URGENT Neurology referral to try to get her in sooner than December if it is indicated and if her headaches are not improving as expected.  If she is improved and headaches resolving on the medications and MRI is normal, she may opt to keep current apt scheduled in December.  Will route to PCP Michele Reaper, FNP to review and follow up with patient on MRI results once available. I will be available to assist with her results as well if needed.  Michele Pilar, DO [facility] 02/19/2023, 7:19 PM

## 2023-02-19 NOTE — Patient Instructions (Addendum)
Thank you for coming to the office today.  Try the samples for migraines  Nurtec ODT dissolving take 1 and see if it stops the headache, you can repeat in 48 hours if still having headache. 2 pills only.  Bernita Raisin is single pill, take only as needed ONE dose, no repeat dose, you are done for 24 hours on this.  Do not take the meds the same day, pick one to start.  I ordered Maxalt, which is a dissolving tab similar to the Ensign. You can repeat within 2 hours if need.  ------------------  [facility].  Stay tuned for results, then we can pursue an urgent referral to [facility].  [facility]) / Surgery performed at [facility].  [address] [place], [place] [address]  Ph - [phone] (to refer)    Please schedule a Follow-up Appointment to: Return if symptoms worsen or fail to improve.  If you have any other questions or concerns, please feel free to call the office or send a message through MyChart. You may also schedule an earlier appointment if necessary.  Additionally, you may be receiving a survey about your experience at our office within a few days to 1 week by e-mail or mail. We value your feedback.  Saralyn Pilar, DO [facility], [place]

## 2023-02-19 NOTE — Telephone Encounter (Signed)
New update 10/16 at 819pm  Notified by [facility] radiology of STAT MRI Results. Spoke with Radiologist Dr Theresia Bough who read the imaging.  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 Recommend neurosurgery referral.  He recommended urgent Neurosurgery follow-up ideally tomorrow 10/17 when their office opens to discuss the case and eval the patient.  I called the patient. Explained the results. She agrees with plan to proceed w/ [facility] urgent referral tomorrow 10/17 and for our office to communicate with them to discuss the case prior to referral.  She says headache currently only dull and improved at this time.  She will wait to hear back from [place] or Neurosurgery with further instruction.  I gave her emergency criteria for when to seek care if severe worsening pressure headache neurological changes as previously discussed.  Michele Pilar, DO [facility] 02/19/2023, 8:22 PM

## 2023-02-19 NOTE — Progress Notes (Signed)
Subjective:    Patient ID: Michele Newton, female    DOB: 22-Mar-1956, 67 y.o.   MRN: [id number]  Michele Newton is a 67 y.o. female presenting on 02/19/2023 for Headache  Patient presents for a same day appointment.  PCP Nicki Reaper, FNP   HPI  Headaches, acute, top of head History of Ventricular Shunt 1993  Reports 2 weeks of constant headache, with some episodic worsening and improvement, waxing and waning, but it does not resolve entirely.  She saw her ENT 1 month ago, and they ordered referral to Neurology and that apt was scheduled w [facility] Neurology (Dr Malvin Johns) on 04/23/23 and they would consider MRI  She has a R ventricular shunt, has not had MRI in 15 years.  She now feels headache with pressure top of head and down neck.  She admits elevated BP lately. Questioning if BP is elevated due to pain or if the BP is up initially  Home reading last night 152/95  She admits history of Migraine headaches previously, she was diagnosed and treated years ago. Not recently. History shows that the migraines actually were headaches caused by the tumor / and fluid build up.  Not on migraine medication  Previously on Tramadol with good pain relief before, not taking regularly, asks about a new order.  History of migraine aura that can impact her vision previously. But not currently.  Denies any focal neurological symptoms, no loss of vision or focal weakness, numbness tingling.      02/19/2023    9:21 AM 11/01/2022    1:05 PM 10/31/2022    9:21 AM  Depression screen PHQ 2/9  Decreased Interest 3 1 1   Down, Depressed, Hopeless 2 1 1   PHQ - 2 Score 5 2 2   Altered sleeping 2 1 1   Tired, decreased energy 3 1 3   Change in appetite 2 0 0  Feeling bad or failure about yourself  1 0 0  Trouble concentrating 2 0 0  Moving slowly or fidgety/restless 1 0 0  Suicidal thoughts 0 0 0  PHQ-9 Score 16 4 6   Difficult doing work/chores Extremely dIfficult Somewhat difficult Somewhat  difficult    Social History   Tobacco Use   Smoking status: Never   Smokeless tobacco: Never  Vaping Use   Vaping status: Never Used  Substance Use Topics   Alcohol use: No   Drug use: No    Review of Systems Per HPI unless specifically indicated above     Objective:    BP 114/70   Pulse 80   Ht 5\' 6"  (1.676 m)   Wt 212 lb (96.2 kg)   SpO2 96%   BMI 34.22 kg/m   Wt Readings from Last 3 Encounters:  02/19/23 212 lb (96.2 kg)  11/01/22 212 lb (96.2 kg)  10/31/22 212 lb (96.2 kg)    Physical Exam Vitals and nursing note reviewed.  Constitutional:      General: She is not in acute distress.    Appearance: Normal appearance. She is well-developed. She is not diaphoretic.     Comments: Well-appearing, comfortable, cooperative  HENT:     Head: Normocephalic and atraumatic.  Eyes:     General:        Right eye: No discharge.        Left eye: No discharge.     Conjunctiva/sclera: Conjunctivae normal.  Neck:     Thyroid: No thyromegaly.  Cardiovascular:     Rate and Rhythm: Normal rate and regular  rhythm.     Heart sounds: Normal heart sounds. No murmur heard. Pulmonary:     Effort: Pulmonary effort is normal. No respiratory distress.     Breath sounds: Normal breath sounds. No wheezing or rales.  Musculoskeletal:        General: Normal range of motion.     Cervical back: Normal range of motion and neck supple.  Lymphadenopathy:     Cervical: No cervical adenopathy.  Skin:    General: Skin is warm and dry.     Findings: No erythema or rash.  Neurological:     General: No focal deficit present.     Mental Status: She is alert and oriented to person, place, and time. Mental status is at baseline.     Cranial Nerves: No cranial nerve deficit.     Sensory: No sensory deficit.     Motor: No weakness.     Gait: Gait normal.  Psychiatric:        Mood and Affect: Mood normal.        Behavior: Behavior normal.        Thought Content: Thought content normal.      Comments: Well groomed, good eye contact, normal speech and thoughts    I have personally reviewed the radiology report from 02/19/23 on BRAIN MRI.  CLINICAL DATA:  Headaches for 2 weeks. History of ventricular shunt.   EXAM: MRI HEAD WITHOUT AND WITH CONTRAST   TECHNIQUE: Multiplanar, multiecho pulse sequences of the brain and surrounding structures were obtained without and with intravenous contrast.   CONTRAST:  10mL GADAVIST GADOBUTROL 1 MMOL/ML IV SOLN   COMPARISON:  Brain MRI 12/21/2014   FINDINGS: Brain: A right parietal ventricular catheter is in place terminating in the body of the right lateral ventricle. Compared to the study from 12/21/2014, the ventricular system is significantly more dilated with periventricular FLAIR signal abnormality concerning for transependymal flow of CSF. Findings suspicious for shunt malfunction.   There is no acute intracranial hemorrhage, extra-axial fluid collection, or acute infarct.   Background parenchymal volume is normal. There is minimal background chronic small-vessel ischemic change.   The pituitary and suprasellar region are normal. There is no mass lesion or abnormal enhancement. There is no mass effect or midline shift.   Internal auditory canals: There is no cerebellopontine angle mass. The cochleae and semicircular canals are normal. No focal abnormality along the course of the 7th and 8th cranial nerves. Normal porus acusticus and vestibular aqueduct bilaterally.   Vascular: Normal flow voids.   Skull and upper cervical spine: Normal marrow signal.   Sinuses/Orbits: The paranasal sinuses are clear. Bilateral lens implants are in place. The globes and orbits are otherwise unremarkable.   Other: The mastoid air cells and middle ear cavities are clear.   IMPRESSION: Right parietal ventricular catheter in place with significantly increased caliber of the ventricular system since the study from 2016, and  suspicion for transependymal flow of CSF. 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 Recommend neurosurgery referral.   These results were called by telephone at the time of interpretation on 02/19/2023 at 8:12 pm to provider [facility] , who verbally acknowledged these results.     Electronically Signed   By: Lesia Hausen M.D.   On: 02/19/2023 20:13   Results for orders placed or performed in visit on 06/14/22  Urine Culture   Specimen: Urine  Result Value Ref Range   MICRO NUMBER: [id number]    SPECIMEN QUALITY: Adequate    Sample  Source URINE, CLEAN CATCH    STATUS: FINAL    Result:      Mixed genital flora isolated. These superficial bacteria are not indicative of a urinary tract infection. No further organism identification is warranted on this specimen. If clinically indicated, recollect clean-catch, mid-stream urine and transfer  immediately to Urine Culture Transport Tube.   POCT urinalysis dipstick  Result Value Ref Range   Color, UA     Clarity, UA     Glucose, UA Negative Negative   Bilirubin, UA Negative    Ketones, UA Negative    Spec Grav, UA >=1.030 (A) 1.010 - 1.025   Blood, UA Negative    pH, UA 6.0 5.0 - 8.0   Protein, UA Negative Negative   Urobilinogen, UA 0.2 0.2 or 1.0 E.U./dL   Nitrite, UA Negative    Leukocytes, UA Negative Negative   Appearance     Odor        Assessment & Plan:   Problem List Items Addressed This Visit   None Visit Diagnoses     Acute non intractable tension-type headache    -  Primary   Relevant Medications   rizatriptan (MAXALT-MLT) 10 MG disintegrating tablet   traMADol (ULTRAM) 50 MG tablet   Other Relevant Orders   MR BRAIN/IAC W WO CONTRAST   S/P ventricular shunt placement       Relevant Medications   traMADol (ULTRAM) 50 MG tablet   Other Relevant Orders   MR BRAIN/IAC W WO CONTRAST   Episodic migraine       Relevant Medications   rizatriptan (MAXALT-MLT) 10 MG disintegrating tablet   traMADol  (ULTRAM) 50 MG tablet      Assessment and Plan    Acute Severe Headache History of Episodic Migraine headaches w/ aura  S/p Ventricular shunt 1993  Persistent headache for two weeks, with pressure sensation and top of head / neck pain. History of migraines and ventricular shunt placement for hydrocephalus. Patient reports light sensitivity and noise intolerance, suggestive of possible migraine component.  -Trial of Nurtec and Ubrelvy samples for potential migraine relief. -Prescribe Tramadol 50mg  every 6 hours as needed for pain relief. -Order Maxalt ODT 10mg  AS NEEDED migraine if needed.  -Order STAT MRI of the brain to assess ventricular shunt and other potential causes of headache. - Will work with referral coordinator today to schedule. She has a MRI non urgent order for December, we will cancel and switch it to STAT sooner if possible. - Additionally after results of MRI we will review promptly and refer more urgently to Neurosurgery based on results.  Hypertension Elevated blood pressure readings at home, possibly related to pain. -Monitor blood pressure at home and report any consistently high readings.  Follow-up -Expedite MRI scheduling and follow-up with neurosurgery after results are available. -Continue monitoring headache response to migraine medications and Tramadol.      **Update 02/20/23 Reviewed MRI Results last night from STAT MRI    IMPRESSION: Right parietal ventricular catheter in place with significantly increased caliber of the ventricular system since the study from 2016, and suspicion for transependymal flow of CSF. 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 Recommend neurosurgery referral.  I discussed case with Radiologist who read imaging and they advised Urgent Neurosurgery follow-up and evaluation due to suspected Intraventricular shunt failure.  Patient was notified of this plan last night. Will coordinate Urgent referral today and follow-up  with patient.  Orders Placed This Encounter  Procedures   MR BRAIN/IAC W WO CONTRAST  Standing Status:   Future    Number of Occurrences:   1    Standing Expiration Date:   02/19/2024    Order Specific Question:   Reason for Exam (SYMPTOM  OR DIAGNOSIS REQUIRED)    Answer:   acute pressure headache, ventricular shunt in place for years    Order Specific Question:   If indicated for the ordered procedure, I authorize the administration of contrast media per Radiology protocol    Answer:   Yes    Order Specific Question:   What is the patient's sedation requirement?    Answer:   Anti-anxiety    Order Specific Question:   Does the patient have a pacemaker or implanted devices?    Answer:   Yes    Order Specific Question:   Manufacturer of pacemake or implanted device?    Answer:   ventricular shunt    Order Specific Question:   Year of pacemaker or implanted device?    Answer:   05/07/1991    Order Specific Question:   Call Results- Best Contact Number?    Answer:   [phone]    Order Specific Question:   Preferred imaging location?    Answer:   [facility] (table limit - 550lbs)   Ambulatory referral to Neurosurgery    Referral Priority:   Urgent    Referral Type:   Surgical    Referral Reason:   Specialty Services Required    Requested Specialty:   Neurosurgery    Number of Visits Requested:   1      Meds ordered this encounter  Medications   rizatriptan (MAXALT-MLT) 10 MG disintegrating tablet    Sig: Take 1 tab (dissolve in mouth) at first sign of migraine headache. May repeat in 2 hours if needed for max dose in 24 hours.    Dispense:  10 tablet    Refill:  0   traMADol (ULTRAM) 50 MG tablet    Sig: Take 1 tablet (50 mg total) by mouth every 6 (six) hours as needed.    Dispense:  20 tablet    Refill:  0     Follow up plan: Return if symptoms worsen or fail to improve.    Saralyn Pilar, DO [facility] 02/19/2023, 9:25 AM

## 2023-02-20 ENCOUNTER — Ambulatory Visit: Payer: Medicare HMO | Admitting: Neurosurgery

## 2023-02-20 ENCOUNTER — Other Ambulatory Visit: Payer: Self-pay

## 2023-02-20 ENCOUNTER — Encounter: Payer: Self-pay | Admitting: Neurosurgery

## 2023-02-20 VITALS — BP 132/82 | Ht 66.0 in | Wt 213.0 lb

## 2023-02-20 DIAGNOSIS — T85618A Breakdown (mechanical) of other specified internal prosthetic devices, implants and grafts, initial encounter: Secondary | ICD-10-CM | POA: Diagnosis not present

## 2023-02-20 DIAGNOSIS — R519 Headache, unspecified: Secondary | ICD-10-CM | POA: Diagnosis not present

## 2023-02-20 DIAGNOSIS — D354 Benign neoplasm of pineal gland: Secondary | ICD-10-CM | POA: Diagnosis not present

## 2023-02-20 DIAGNOSIS — G911 Obstructive hydrocephalus: Secondary | ICD-10-CM | POA: Diagnosis not present

## 2023-02-20 DIAGNOSIS — Z01818 Encounter for other preprocedural examination: Secondary | ICD-10-CM

## 2023-02-20 NOTE — H&P (View-Only) (Signed)
Referring Physician:  Smitty Cords, DO [address] [place],  [place] [address]  Primary Physician:  Lorre Munroe, NP  History of Present Illness: 02/20/2023 Michele Newton is here today with a chief complaint of worsening headaches.  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 The headaches are described as constant, with periods of lessening intensity, but often reaching a level where the patient feels her head is about to explode. 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 The shunt was placed to alleviate the blockage of fluid flow caused by the tumor. The patient reports an improvement in symptoms following the shunt placement. However, the headaches and cognitive decline have raised concerns about a possible shunt malfunction.   Past Surgery: VP Shunt 1995  The symptoms are causing a significant impact on the patient's life.   I have utilized the care everywhere function in epic to review the outside records available from external health systems.  Review of Systems:  A 10 point review of systems is negative, except for the pertinent positives and negatives detailed in the HPI.  Past Medical History: Past Medical History:  Diagnosis Date   Anxiety    Arthritis    Asthma    Depression    Frequent headaches    GERD (gastroesophageal reflux disease)    Hydrocephalus (HCC) 1995   Hypertension    Migraine    PONV (postoperative nausea and vomiting)    Psoriatic arthritis (HCC)     Past Surgical History: Past Surgical History:  Procedure Laterality Date   BREAST SURGERY  2000   biopsy   NASAL SINUS SURGERY  1998   TENDON REPAIR  Left    hand   TONSILLECTOMY AND ADENOIDECTOMY  1985   TOTAL HIP ARTHROPLASTY Left 11/19/2018   Procedure: TOTAL HIP ARTHROPLASTY ANTERIOR APPROACH;  Surgeon: Kennedy Bucker, MD;  Location: [facility] ORS;  Service: Orthopedics;  Laterality: Left;   TOTAL HIP ARTHROPLASTY Right 07/27/2020   Procedure: TOTAL HIP ARTHROPLASTY ANTERIOR APPROACH;  Surgeon: Kennedy Bucker, MD;  Location: [facility] ORS;  Service: Orthopedics;  Laterality: Right;   TOTAL HIP REVISION Right 12/25/2021   Procedure: Right hip revision, acetabular component & femoral head;  Surgeon: Kennedy Bucker, MD;  Location: [facility] ORS;  Service: Orthopedics;  Laterality: Right;   TUBAL LIGATION     TYMPANOSTOMY TUBE PLACEMENT  1998   VENTRICULAR ATRIAL SHUNT Right 1995    Allergies: Allergies as of 02/20/2023 - Review Complete 02/20/2023  Allergen Reaction Noted   Amitriptyline Other (See Comments) 02/11/2013   Effexor [venlafaxine] Other (See Comments) 02/11/2013   Haldol [haloperidol] Other (See Comments) 11/03/2014    Medications:  Current Outpatient Medications:    budesonide-formoterol (SYMBICORT) 160-4.5 MCG/ACT inhaler, Inhale 2 puffs into the lungs 2 (two) times daily., Disp: 1 each, Rfl: 3   docusate sodium (COLACE) 100 MG capsule, Take 1 capsule (100 mg total) by mouth 2 (two) times daily., Disp: 10 capsule, Rfl: 0   fluticasone (FLONASE) 50 MCG/ACT nasal spray, Place 2 sprays into both nostrils daily. Use for 4-6 weeks then stop and use seasonally or as needed., Disp: 16 g, Rfl: 3   montelukast (  SINGULAIR) 10 MG tablet, TAKE 1 TABLET(10 MG) BY MOUTH AT BEDTIME, Disp: 90 tablet, Rfl: 3   Nebulizers (COMPRESSOR/NEBULIZER) MISC, 1 Units by Does not apply route every 4 (four) hours as needed., Disp: 1 each, Rfl: 0   pantoprazole (PROTONIX) 40 MG tablet, TAKE 1 TABLET(40 MG) BY MOUTH TWICE DAILY, Disp: 180 tablet, Rfl: 3   PARoxetine (PAXIL) 40 MG tablet, Take 1.5 tablets (60 mg total) by mouth every morning., Disp: 135 tablet, Rfl:  1   QUEtiapine (SEROQUEL) 200 MG tablet, Take 1 tablet (200 mg total) by mouth at bedtime., Disp: 90 tablet, Rfl: 1   rizatriptan (MAXALT-MLT) 10 MG disintegrating tablet, Take 1 tab (dissolve in mouth) at first sign of migraine headache. May repeat in 2 hours if needed for max dose in 24 hours., Disp: 10 tablet, Rfl: 0   traMADol (ULTRAM) 50 MG tablet, Take 1 tablet (50 mg total) by mouth every 6 (six) hours as needed., Disp: 20 tablet, Rfl: 0   traZODone (DESYREL) 50 MG tablet, TAKE 1/2 TO 1 TABLET(25 TO 50 MG) BY MOUTH AT BEDTIME AS NEEDED FOR SLEEP, Disp: 90 tablet, Rfl: 1   verapamil (CALAN-SR) 180 MG CR tablet, Take 1 tablet (180 mg total) by mouth daily., Disp: 90 tablet, Rfl: 1  Social History: Social History   Tobacco Use   Smoking status: Never   Smokeless tobacco: Never  Vaping Use   Vaping status: Never Used  Substance Use Topics   Alcohol use: No   Drug use: No    Family Medical History: Family History  Problem Relation Age of Onset   Arthritis Mother    Hyperlipidemia Mother    Heart disease Mother 63       Died of heart failure   Hypertension Mother    Diabetes Mother    Depression Mother    Arthritis Father    Cancer Father        prostate cancer   Diabetes Brother    Breast cancer Maternal Grandmother    Colon cancer Neg Hx     Physical Examination: Vitals:   02/20/23 1240  BP: 132/82    General: Patient is in no apparent distress. Attention to examination is appropriate.  Neck:   Supple.  Full range of motion.  Respiratory: Patient is breathing without any difficulty.   NEUROLOGICAL:     Awake, alert, oriented to person, place, and time.  Speech is clear and fluent.   Cranial Nerves: Pupils equal round and reactive to light.  Facial tone is symmetric.  Facial sensation is symmetric. Shoulder shrug is symmetric. Tongue protrusion is midline.  There is no pronator drift.  Strength: Side Biceps Triceps Deltoid Interossei Grip Wrist Ext. Wrist  Flex.  R 5 5 5 5 5 5 5   L 5 5 5 5 5 5 5    Side Iliopsoas Quads Hamstring PF DF EHL  R 5 5 5 5 5 5   L 5 5 5 5 5 5    Reflexes are 1+ and symmetric at the biceps, triceps, brachioradialis, patella and achilles.   Hoffman's is absent.   Bilateral upper and lower extremity sensation is intact to light touch.    No evidence of dysmetria noted.  Gait is normal.     Medical Decision Making  Imaging: MRI Brain 02/19/2023 IMPRESSION: Right parietal ventricular catheter in place with significantly increased caliber of the ventricular system since the study from 2016, and suspicion for transependymal flow of CSF. 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  Recommend neurosurgery referral.   These results were called by telephone at the time of interpretation on 02/19/2023 at 8:12 pm to provider [facility] , who verbally acknowledged these results.     Electronically Signed   By: Lesia Hausen M.D.   On: 02/19/2023 20:13  I have personally reviewed the images and agree with the above interpretation.  Assessment and Plan: Michele Newton is a pleasant 66 y.o. female with worsening headaches as well as change MRI scan with increasing ventricular size concerning for incomplete shunt malfunction.  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  I do not think she has a fulminant shunt malfunction, as she has had symptoms for several months.  On her examination today, her headache was approximately 4 out of 10.  It is worse in the mornings, consistent with general function.  Given the change in her ventricular size as well as evidence of transependymal flow, I feel that shunt revision is an appropriate neck step.  I have recommended investigation and exploration of her shunt with possible replacement of any nonfunctional portions of her shunt.  I discussed the planned procedure at length with the patient, including the risks, benefits, alternatives, and indications. The risks  discussed include but are not limited to bleeding, infection, need for reoperation, spinal fluid leak, stroke, vision loss, anesthetic complication, coma, paralysis, and even death. I also described in detail that improvement was not guaranteed.  The patient expressed understanding of these risks, and asked that we proceed with surgery. I described the surgery in layman's terms, and gave ample opportunity for questions, which were answered to the best of my ability.  She and her husband expressed understanding.  We discussed that she should present to the emergency department if her symptoms worsen in the next few days.  Will plan for her surgery next week.  I spent a total of 40 minutes in this patient's care today. This time was spent reviewing pertinent records including imaging studies, obtaining and confirming history, performing a directed evaluation, formulating and discussing my recommendations, and documenting the visit within the medical record.     Thank you for involving me in the care of this patient.      Careli Luzader K. Myer Haff MD, [facility] Neurosurgery

## 2023-02-20 NOTE — Addendum Note (Signed)
Addended by: Smitty Cords on: 02/20/2023 08:16 AM   Modules accepted: Orders

## 2023-02-20 NOTE — Progress Notes (Deleted)
Referring Physician:  Smitty Cords, DO [address] [place],  [place] [address]  Primary Physician:  Lorre Munroe, NP  History of Present Illness: 02/20/2023 Michele Newton is here today with a chief complaint of ***    Past Surgery: *** Ventricular Shunt placed in 1995  Michele Newton has ***no symptoms of cervical myelopathy.  The symptoms are causing a significant impact on the patient's life.   I have utilized the care everywhere function in epic to review the outside records available from external health systems.  Review of Systems:  A 10 point review of systems is negative, except for the pertinent positives and negatives detailed in the HPI.  Past Medical History: Past Medical History:  Diagnosis Date   Anxiety    Arthritis    Asthma    Depression    Frequent headaches    GERD (gastroesophageal reflux disease)    Hydrocephalus (HCC) 1995   Hypertension    Migraine    PONV (postoperative nausea and vomiting)    Psoriatic arthritis (HCC)     Past Surgical History: Past Surgical History:  Procedure Laterality Date   BREAST SURGERY  2000   biopsy   NASAL SINUS SURGERY  1998   TENDON REPAIR Left    hand   TONSILLECTOMY AND ADENOIDECTOMY  1985   TOTAL HIP ARTHROPLASTY Left 11/19/2018   Procedure: TOTAL HIP ARTHROPLASTY ANTERIOR APPROACH;  Surgeon: Kennedy Bucker, MD;  Location: [facility];  Service: Orthopedics;  Laterality: Left;   TOTAL HIP ARTHROPLASTY Right 07/27/2020   Procedure: TOTAL HIP ARTHROPLASTY ANTERIOR APPROACH;  Surgeon: Kennedy Bucker, MD;  Location: [facility];  Service: Orthopedics;  Laterality: Right;   TOTAL HIP REVISION Right 12/25/2021   Procedure: Right hip revision, acetabular component & femoral head;  Surgeon: Kennedy Bucker, MD;  Location: [facility];  Service: Orthopedics;  Laterality: Right;   TUBAL LIGATION     TYMPANOSTOMY TUBE PLACEMENT  1998   VENTRICULAR ATRIAL SHUNT Right 1995    Allergies: Allergies as of 02/20/2023  - Review Complete 02/19/2023  Allergen Reaction Noted   Amitriptyline Other (See Comments) 02/11/2013   Effexor [venlafaxine] Other (See Comments) 02/11/2013   Haldol [haloperidol] Other (See Comments) 11/03/2014    Medications:  Current Outpatient Medications:    albuterol (PROVENTIL) (2.5 MG/3ML) 0.083% nebulizer solution, Take 3 mLs (2.5 mg total) by nebulization every 4 (four) hours as needed for wheezing or shortness of breath. (Patient not taking: Reported on 02/19/2023), Disp: 75 mL, Rfl: 0   budesonide-formoterol (SYMBICORT) 160-4.5 MCG/ACT inhaler, Inhale 2 puffs into the lungs 2 (two) times daily., Disp: 1 each, Rfl: 3   docusate sodium (COLACE) 100 MG capsule, Take 1 capsule (100 mg total) by mouth 2 (two) times daily., Disp: 10 capsule, Rfl: 0   fluticasone (FLONASE) 50 MCG/ACT nasal spray, Place 2 sprays into both nostrils daily. Use for 4-6 weeks then stop and use seasonally or as needed., Disp: 16 g, Rfl: 3   LINZESS 72 MCG capsule, TAKE 1 CAPSULE(72 MCG) BY MOUTH DAILY BEFORE BREAKFAST (Patient not taking: Reported on 02/19/2023), Disp: 90 capsule, Rfl: 0   montelukast (SINGULAIR) 10 MG tablet, TAKE 1 TABLET(10 MG) BY MOUTH AT BEDTIME, Disp: 90 tablet, Rfl: 3   Nebulizers (COMPRESSOR/NEBULIZER) MISC, 1 Units by Does not apply route every 4 (four) hours as needed., Disp: 1 each, Rfl: 0   pantoprazole (PROTONIX) 40 MG tablet, TAKE 1 TABLET(40 MG) BY MOUTH TWICE DAILY, Disp: 180 tablet, Rfl: 3  PARoxetine (PAXIL) 40 MG tablet, Take 1.5 tablets (60 mg total) by mouth every morning., Disp: 135 tablet, Rfl: 1   QUEtiapine (SEROQUEL) 200 MG tablet, Take 1 tablet (200 mg total) by mouth at bedtime., Disp: 90 tablet, Rfl: 1   rizatriptan (MAXALT-MLT) 10 MG disintegrating tablet, Take 1 tab (dissolve in mouth) at first sign of migraine headache. May repeat in 2 hours if needed for max dose in 24 hours., Disp: 10 tablet, Rfl: 0   traMADol (ULTRAM) 50 MG tablet, Take 1 tablet (50 mg total)  by mouth every 6 (six) hours as needed., Disp: 20 tablet, Rfl: 0   traZODone (DESYREL) 50 MG tablet, TAKE 1/2 TO 1 TABLET(25 TO 50 MG) BY MOUTH AT BEDTIME AS NEEDED FOR SLEEP, Disp: 90 tablet, Rfl: 1   verapamil (CALAN-SR) 180 MG CR tablet, Take 1 tablet (180 mg total) by mouth daily., Disp: 90 tablet, Rfl: 1  Social History: Social History   Tobacco Use   Smoking status: Never   Smokeless tobacco: Never  Vaping Use   Vaping status: Never Used  Substance Use Topics   Alcohol use: No   Drug use: No    Family Medical History: Family History  Problem Relation Age of Onset   Arthritis Mother    Hyperlipidemia Mother    Heart disease Mother 47       Died of heart failure   Hypertension Mother    Diabetes Mother    Depression Mother    Arthritis Father    Cancer Father        prostate cancer   Diabetes Brother    Breast cancer Maternal Grandmother    Colon cancer Neg Hx     Physical Examination: There were no vitals filed for this visit.  General: Patient is in no apparent distress. Attention to examination is appropriate.  Neck:   Supple.  Full range of motion.  Respiratory: Patient is breathing without any difficulty.   NEUROLOGICAL:     Awake, alert, oriented to person, place, and time.  Speech is clear and fluent.   Cranial Nerves: Pupils equal round and reactive to light.  Facial tone is symmetric.  Facial sensation is symmetric. Shoulder shrug is symmetric. Tongue protrusion is midline.  There is no pronator drift.  Strength: Side Biceps Triceps Deltoid Interossei Grip Wrist Ext. Wrist Flex.  R 5 5 5 5 5 5 5   L 5 5 5 5 5 5 5    Side Iliopsoas Quads Hamstring PF DF EHL  R 5 5 5 5 5 5   L 5 5 5 5 5 5    Reflexes are ***2+ and symmetric at the biceps, triceps, brachioradialis, patella and achilles.   Hoffman's is absent.   Bilateral upper and lower extremity sensation is intact to light touch.    No evidence of dysmetria noted.  Gait is normal.      Medical Decision Making  Imaging: ***  I have personally reviewed the images and agree with the above interpretation.  Assessment and Plan: Michele Newton is a pleasant 67 y.o. female with ***    Thank you for involving me in the care of this patient.      Chester K. Myer Haff MD, [facility] Neurosurgery

## 2023-02-20 NOTE — Patient Instructions (Signed)
Please see below for information in regards to your upcoming surgery:   Planned surgery: ventriculoperitoneal shunt revision   Surgery date: 02/26/23 at [facility] (Medical Mall: [address] [place], [place] [address]) - you will find out your arrival time the business day before your surgery.   Pre-op appointment at [facility] Pre-admit Testing: we will call you with a date/time for this. If you are scheduled for an in person appointment, Pre-admit Testing is located on the first floor of the [facility] building, [address] [facility], [address]. Please bring all prescriptions in the original prescription bottles to your appointment. During this appointment, they will advise you which medications you can take the morning of surgery, and which medications you will need to hold for surgery. Labs (such as blood work, EKG) may be done at your pre-op appointment. You are not required to fast for these labs. Should you need to change your pre-op appointment, please call Pre-admit testing at [phone].     Surgical clearance: we will send a clearance form to Dr Althea Charon. They may wish to see you in their office prior to signing the clearance form. If so, they may call you to schedule an appointment.    How to contact us:  If you have any questions/concerns before or after surgery, you can reach [place] at [phone], or you can send a mychart message. We can be reached by phone or mychart 8am-4pm, Monday-Friday.  *Please note: Calls after 4pm are forwarded to a third party answering service. Mychart messages are not routinely monitored during evenings, weekends, and holidays. Please call our office to contact the answering service for urgent concerns during non-business hours.    Appointments/FMLA & disability paperwork: Joycelyn Rua, & Flonnie Hailstone Registered Nurse/Surgery scheduler: Royston Cowper Medical Assistants: Nash Mantis Physician Assistants: Manning Charity, PA-C & Drake Leach, PA-C Surgeons: Venetia Night, MD & Ernestine Mcmurray, MD

## 2023-02-20 NOTE — Progress Notes (Signed)
Referring Physician:  Smitty Cords, DO [address] [place],  [place] [address]  Primary Physician:  Lorre Munroe, NP  History of Present Illness: 02/20/2023 [name] is here today with a chief complaint of worsening headaches.  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 The headaches are described as constant, with periods of lessening intensity, but often reaching a level where the patient feels her head is about to explode. 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 The shunt was placed to alleviate the blockage of fluid flow caused by the tumor. The patient reports an improvement in symptoms following the shunt placement. However, the headaches and cognitive decline have raised concerns about a possible shunt malfunction.   Past Surgery: VP Shunt 1995  The symptoms are causing a significant impact on the patient's life.   I have utilized the care everywhere function in epic to review the outside records available from external health systems.  Review of Systems:  A 10 point review of systems is negative, except for the pertinent positives and negatives detailed in the HPI.  Past Medical History: Past Medical History:  Diagnosis Date   Anxiety    Arthritis    Asthma    Depression    Frequent headaches    GERD (gastroesophageal reflux disease)    Hydrocephalus (HCC) 1995   Hypertension    Migraine    PONV (postoperative nausea and vomiting)    Psoriatic arthritis (HCC)     Past Surgical History: Past Surgical History:  Procedure Laterality Date   BREAST SURGERY  2000   biopsy   NASAL SINUS SURGERY  1998   TENDON REPAIR  Left    hand   TONSILLECTOMY AND ADENOIDECTOMY  1985   TOTAL HIP ARTHROPLASTY Left 11/19/2018   Procedure: TOTAL HIP ARTHROPLASTY ANTERIOR APPROACH;  Surgeon: Kennedy Bucker, MD;  Location: [facility];  Service: Orthopedics;  Laterality: Left;   TOTAL HIP ARTHROPLASTY Right 07/27/2020   Procedure: TOTAL HIP ARTHROPLASTY ANTERIOR APPROACH;  Surgeon: Kennedy Bucker, MD;  Location: [facility];  Service: Orthopedics;  Laterality: Right;   TOTAL HIP REVISION Right 12/25/2021   Procedure: Right hip revision, acetabular component & femoral head;  Surgeon: Kennedy Bucker, MD;  Location: [facility];  Service: Orthopedics;  Laterality: Right;   TUBAL LIGATION     TYMPANOSTOMY TUBE PLACEMENT  1998   VENTRICULAR ATRIAL SHUNT Right 1995    Allergies: Allergies as of 02/20/2023 - Review Complete 02/20/2023  Allergen Reaction Noted   Amitriptyline Other (See Comments) 02/11/2013   Effexor [venlafaxine] Other (See Comments) 02/11/2013   Haldol [haloperidol] Other (See Comments) 11/03/2014    Medications:  Current Outpatient Medications:    budesonide-formoterol (SYMBICORT) 160-4.5 MCG/ACT inhaler, Inhale 2 puffs into the lungs 2 (two) times daily., Disp: 1 each, Rfl: 3   docusate sodium (COLACE) 100 MG capsule, Take 1 capsule (100 mg total) by mouth 2 (two) times daily., Disp: 10 capsule, Rfl: 0   fluticasone (FLONASE) 50 MCG/ACT nasal spray, Place 2 sprays into both nostrils daily. Use for 4-6 weeks then stop and use seasonally or as needed., Disp: 16 g, Rfl: 3   montelukast (  SINGULAIR) 10 MG tablet, TAKE 1 TABLET(10 MG) BY MOUTH AT BEDTIME, Disp: 90 tablet, Rfl: 3   Nebulizers (COMPRESSOR/NEBULIZER) MISC, 1 Units by Does not apply route every 4 (four) hours as needed., Disp: 1 each, Rfl: 0   pantoprazole (PROTONIX) 40 MG tablet, TAKE 1 TABLET(40 MG) BY MOUTH TWICE DAILY, Disp: 180 tablet, Rfl: 3   PARoxetine (PAXIL) 40 MG tablet, Take 1.5 tablets (60 mg total) by mouth every morning., Disp: 135 tablet, Rfl:  1   QUEtiapine (SEROQUEL) 200 MG tablet, Take 1 tablet (200 mg total) by mouth at bedtime., Disp: 90 tablet, Rfl: 1   rizatriptan (MAXALT-MLT) 10 MG disintegrating tablet, Take 1 tab (dissolve in mouth) at first sign of migraine headache. May repeat in 2 hours if needed for max dose in 24 hours., Disp: 10 tablet, Rfl: 0   traMADol (ULTRAM) 50 MG tablet, Take 1 tablet (50 mg total) by mouth every 6 (six) hours as needed., Disp: 20 tablet, Rfl: 0   traZODone (DESYREL) 50 MG tablet, TAKE 1/2 TO 1 TABLET(25 TO 50 MG) BY MOUTH AT BEDTIME AS NEEDED FOR SLEEP, Disp: 90 tablet, Rfl: 1   verapamil (CALAN-SR) 180 MG CR tablet, Take 1 tablet (180 mg total) by mouth daily., Disp: 90 tablet, Rfl: 1  Social History: Social History   Tobacco Use   Smoking status: Never   Smokeless tobacco: Never  Vaping Use   Vaping status: Never Used  Substance Use Topics   Alcohol use: No   Drug use: No    Family Medical History: Family History  Problem Relation Age of Onset   Arthritis Mother    Hyperlipidemia Mother    Heart disease Mother 63       Died of heart failure   Hypertension Mother    Diabetes Mother    Depression Mother    Arthritis Father    Cancer Father        prostate cancer   Diabetes Brother    Breast cancer Maternal Grandmother    Colon cancer Neg Hx     Physical Examination: Vitals:   02/20/23 1240  BP: 132/82    General: Patient is in no apparent distress. Attention to examination is appropriate.  Neck:   Supple.  Full range of motion.  Respiratory: Patient is breathing without any difficulty.   NEUROLOGICAL:     Awake, alert, oriented to person, place, and time.  Speech is clear and fluent.   Cranial Nerves: Pupils equal round and reactive to light.  Facial tone is symmetric.  Facial sensation is symmetric. Shoulder shrug is symmetric. Tongue protrusion is midline.  There is no pronator drift.  Strength: Side Biceps Triceps Deltoid Interossei Grip Wrist Ext. Wrist  Flex.  R 5 5 5 5 5 5 5   L 5 5 5 5 5 5 5    Side Iliopsoas Quads Hamstring PF DF EHL  R 5 5 5 5 5 5   L 5 5 5 5 5 5    Reflexes are 1+ and symmetric at the biceps, triceps, brachioradialis, patella and achilles.   Hoffman's is absent.   Bilateral upper and lower extremity sensation is intact to light touch.    No evidence of dysmetria noted.  Gait is normal.     Medical Decision Making  Imaging: MRI Brain 02/19/2023 IMPRESSION: Right parietal ventricular catheter in place with significantly increased caliber of the ventricular system since the study from 2016, and suspicion for transependymal flow of CSF. 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  Recommend neurosurgery referral.   These results were called by telephone at the time of interpretation on 02/19/2023 at 8:12 pm to provider [facility] , who verbally acknowledged these results.     Electronically Signed   By: Lesia Hausen M.D.   On: 02/19/2023 20:13  I have personally reviewed the images and agree with the above interpretation.  Assessment and Plan: Ms. Attwood is a pleasant 66 y.o. female with worsening headaches as well as change MRI scan with increasing ventricular size concerning for incomplete shunt malfunction.  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  I do not think she has a fulminant shunt malfunction, as she has had symptoms for several months.  On her examination today, her headache was approximately 4 out of 10.  It is worse in the mornings, consistent with general function.  Given the change in her ventricular size as well as evidence of transependymal flow, I feel that shunt revision is an appropriate neck step.  I have recommended investigation and exploration of her shunt with possible replacement of any nonfunctional portions of her shunt.  I discussed the planned procedure at length with the patient, including the risks, benefits, alternatives, and indications. The risks  discussed include but are not limited to bleeding, infection, need for reoperation, spinal fluid leak, stroke, vision loss, anesthetic complication, coma, paralysis, and even death. I also described in detail that improvement was not guaranteed.  The patient expressed understanding of these risks, and asked that we proceed with surgery. I described the surgery in layman's terms, and gave ample opportunity for questions, which were answered to the best of my ability.  She and her husband expressed understanding.  We discussed that she should present to the emergency department if her symptoms worsen in the next few days.  Will plan for her surgery next week.  I spent a total of 40 minutes in this patient's care today. This time was spent reviewing pertinent records including imaging studies, obtaining and confirming history, performing a directed evaluation, formulating and discussing my recommendations, and documenting the visit within the medical record.     Thank you for involving me in the care of this patient.      Careli Luzader K. Myer Haff MD, [facility] Neurosurgery

## 2023-02-20 NOTE — Telephone Encounter (Signed)
Thanks for seeing her

## 2023-02-21 ENCOUNTER — Telehealth: Payer: Self-pay

## 2023-02-21 NOTE — Telephone Encounter (Signed)
Left a message for the patient to call the office. Per Dr. Everlene Farrier would like to see the patient on Monday 10/21.

## 2023-02-24 ENCOUNTER — Encounter
Admission: RE | Admit: 2023-02-24 | Discharge: 2023-02-24 | Disposition: A | Discharge status: Home / Self Care | Payer: Medicare HMO | Source: Ambulatory Visit | Attending: Neurosurgery | Admitting: Neurosurgery

## 2023-02-24 ENCOUNTER — Other Ambulatory Visit: Payer: Self-pay

## 2023-02-24 ENCOUNTER — Ambulatory Visit: Payer: Medicare HMO | Admitting: Surgery

## 2023-02-24 ENCOUNTER — Encounter: Payer: Self-pay | Admitting: Surgery

## 2023-02-24 VITALS — BP 144/83 | HR 76 | Temp 97.6°F | Ht 66.0 in | Wt 209.4 lb

## 2023-02-24 VITALS — BP 151/90 | HR 68 | Temp 97.6°F | Resp 16 | Ht 66.0 in | Wt 215.7 lb

## 2023-02-24 DIAGNOSIS — G919 Hydrocephalus, unspecified: Secondary | ICD-10-CM | POA: Diagnosis present

## 2023-02-24 DIAGNOSIS — Z982 Presence of cerebrospinal fluid drainage device: Secondary | ICD-10-CM | POA: Diagnosis not present

## 2023-02-24 DIAGNOSIS — G911 Obstructive hydrocephalus: Secondary | ICD-10-CM

## 2023-02-24 DIAGNOSIS — Z7951 Long term (current) use of inhaled steroids: Secondary | ICD-10-CM | POA: Diagnosis not present

## 2023-02-24 DIAGNOSIS — T85618A Breakdown (mechanical) of other specified internal prosthetic devices, implants and grafts, initial encounter: Secondary | ICD-10-CM | POA: Diagnosis not present

## 2023-02-24 DIAGNOSIS — Z01818 Encounter for other preprocedural examination: Secondary | ICD-10-CM | POA: Insufficient documentation

## 2023-02-24 DIAGNOSIS — E782 Mixed hyperlipidemia: Secondary | ICD-10-CM | POA: Insufficient documentation

## 2023-02-24 DIAGNOSIS — Z96643 Presence of artificial hip joint, bilateral: Secondary | ICD-10-CM | POA: Diagnosis present

## 2023-02-24 DIAGNOSIS — F419 Anxiety disorder, unspecified: Secondary | ICD-10-CM | POA: Diagnosis present

## 2023-02-24 DIAGNOSIS — I517 Cardiomegaly: Secondary | ICD-10-CM | POA: Diagnosis not present

## 2023-02-24 DIAGNOSIS — G43909 Migraine, unspecified, not intractable, without status migrainosus: Secondary | ICD-10-CM | POA: Diagnosis present

## 2023-02-24 DIAGNOSIS — Z803 Family history of malignant neoplasm of breast: Secondary | ICD-10-CM | POA: Diagnosis not present

## 2023-02-24 DIAGNOSIS — I10 Essential (primary) hypertension: Secondary | ICD-10-CM | POA: Insufficient documentation

## 2023-02-24 DIAGNOSIS — Z83438 Family history of other disorder of lipoprotein metabolism and other lipidemia: Secondary | ICD-10-CM | POA: Diagnosis not present

## 2023-02-24 DIAGNOSIS — Y752 Prosthetic and other implants, materials and neurological devices associated with adverse incidents: Secondary | ICD-10-CM | POA: Diagnosis present

## 2023-02-24 DIAGNOSIS — Z8261 Family history of arthritis: Secondary | ICD-10-CM | POA: Diagnosis not present

## 2023-02-24 DIAGNOSIS — J45909 Unspecified asthma, uncomplicated: Secondary | ICD-10-CM | POA: Diagnosis present

## 2023-02-24 DIAGNOSIS — T8501XA Breakdown (mechanical) of ventricular intracranial (communicating) shunt, initial encounter: Secondary | ICD-10-CM | POA: Diagnosis present

## 2023-02-24 DIAGNOSIS — Z888 Allergy status to other drugs, medicaments and biological substances status: Secondary | ICD-10-CM | POA: Diagnosis not present

## 2023-02-24 DIAGNOSIS — R0989 Other specified symptoms and signs involving the circulatory and respiratory systems: Secondary | ICD-10-CM | POA: Diagnosis not present

## 2023-02-24 DIAGNOSIS — K219 Gastro-esophageal reflux disease without esophagitis: Secondary | ICD-10-CM | POA: Diagnosis present

## 2023-02-24 DIAGNOSIS — F32A Depression, unspecified: Secondary | ICD-10-CM | POA: Diagnosis present

## 2023-02-24 DIAGNOSIS — L4050 Arthropathic psoriasis, unspecified: Secondary | ICD-10-CM | POA: Diagnosis present

## 2023-02-24 DIAGNOSIS — Z8249 Family history of ischemic heart disease and other diseases of the circulatory system: Secondary | ICD-10-CM | POA: Diagnosis not present

## 2023-02-24 DIAGNOSIS — Z818 Family history of other mental and behavioral disorders: Secondary | ICD-10-CM | POA: Diagnosis not present

## 2023-02-24 DIAGNOSIS — Z01812 Encounter for preprocedural laboratory examination: Secondary | ICD-10-CM

## 2023-02-24 DIAGNOSIS — Z79899 Other long term (current) drug therapy: Secondary | ICD-10-CM | POA: Diagnosis not present

## 2023-02-24 DIAGNOSIS — Z833 Family history of diabetes mellitus: Secondary | ICD-10-CM | POA: Diagnosis not present

## 2023-02-24 LAB — CBC
HCT: 45.3 % (ref 36.0–46.0)
Hemoglobin: 14.6 g/dL (ref 12.0–15.0)
MCH: 27.5 pg (ref 26.0–34.0)
MCHC: 32.2 g/dL (ref 30.0–36.0)
MCV: 85.5 fL (ref 80.0–100.0)
Platelets: 410 10*3/uL — ABNORMAL HIGH (ref 150–400)
RBC: 5.3 MIL/uL — ABNORMAL HIGH (ref 3.87–5.11)
RDW: 13.3 % (ref 11.5–15.5)
WBC: 7.6 10*3/uL (ref 4.0–10.5)
nRBC: 0 % (ref 0.0–0.2)

## 2023-02-24 LAB — SURGICAL PCR SCREEN
MRSA, PCR: NEGATIVE
Staphylococcus aureus: NEGATIVE

## 2023-02-24 LAB — BASIC METABOLIC PANEL
Anion gap: 7 (ref 5–15)
BUN: 14 mg/dL (ref 8–23)
CO2: 27 mmol/L (ref 22–32)
Calcium: 9 mg/dL (ref 8.9–10.3)
Chloride: 103 mmol/L (ref 98–111)
Creatinine, Ser: 0.94 mg/dL (ref 0.44–1.00)
GFR, Estimated: >60 mL/min (ref >60)
Glucose, Bld: 102 mg/dL — ABNORMAL HIGH (ref 70–99)
Potassium: 4.8 mmol/L (ref 3.5–5.1)
Sodium: 137 mmol/L (ref 135–145)

## 2023-02-24 LAB — TYPE AND SCREEN
ABO/RH(D): O POS
Antibody Screen: NEGATIVE

## 2023-02-24 NOTE — Patient Instructions (Addendum)
Your procedure is scheduled on: Wednesday October 23  Report to the Registration Desk on the 1st floor of the [facility] To find out your arrival time, please call [phone] between 1PM - 3PM on:  Tuesday October 22 If your arrival time is 6:00 am, do not arrive before that time as the Medical Mall entrance doors do not open until 6:00 am.  REMEMBER: Instructions that are not followed completely may result in serious medical risk, up to and including death; or upon the discretion of your surgeon and anesthesiologist your surgery may need to be rescheduled.  Do not eat food after midnight the night before surgery.  No gum chewing or hard candies.  You may however, drink CLEAR liquids up to 2 hours before you are scheduled to arrive for your surgery. Do not drink anything within 2 hours of your scheduled arrival time.  Clear liquids include: - water  - apple juice without pulp - gatorade (not RED colors) - black coffee or tea (Do NOT add milk or creamers to the coffee or tea) Do NOT drink anything that is not on this list.  One week prior to surgery: Wednesday October 16  Stop Anti-inflammatories (NSAIDS) such as Advil, Aleve, Ibuprofen, Motrin, Naproxen, Naprosyn and Aspirin based products such as Excedrin, Goody's Powder, BC Powder. Stop ANY OVER THE COUNTER supplements until after surgery. docusate sodium (COLACE)   You may however, continue to take Tylenol if needed for pain up until the day of surgery.  Continue taking all of your other prescription medications up until the day of surgery.  ON THE DAY OF SURGERY ONLY TAKE pantoprazole (PROTONIX)   Use inhalers on the day of surgery and bring to the hospital. budesonide-formoterol (SYMBICORT)   No Alcohol for 24 hours before or after surgery.  No Smoking including e-cigarettes for 24 hours before surgery.  No chewable tobacco products for at least 6 hours before surgery.  No nicotine patches on the day of  surgery.  Do not use any "recreational" drugs for at least a week (preferably 2 weeks) before your surgery.  Please be advised that the combination of cocaine and anesthesia may have negative outcomes, up to and including death. If you test positive for cocaine, your surgery will be cancelled.  On the morning of surgery brush your teeth with toothpaste and water, you may rinse your mouth with mouthwash if you wish. Do not swallow any toothpaste or mouthwash.  Use CHG Soap as directed on instruction sheet.  Do not wear jewelry, make-up, hairpins, clips or nail polish.  For welded (permanent) jewelry: bracelets, anklets, waist bands, etc.  Please have this removed prior to surgery.  If it is not removed, there is a chance that hospital personnel will need to cut it off on the day of surgery.  Do not wear lotions, powders, or perfumes.   Do not shave body hair from the neck down 48 hours before surgery.  Contact lenses, hearing aids and dentures may not be worn into surgery.  Do not bring valuables to the hospital. [facility] is not responsible for any missing/lost belongings or valuables.   Notify your doctor if there is any change in your medical condition (cold, fever, infection).  Wear comfortable clothing (specific to your surgery type) to the hospital.  After surgery, you can help prevent lung complications by doing breathing exercises.  Take deep breaths and cough every 1-2 hours.   If you are being admitted to the hospital overnight, leave  your suitcase in the car. After surgery it may be brought to your room.  In case of increased patient census, it may be necessary for you, the patient, to continue your postoperative care in the Same Day Surgery department.  If you are being discharged the day of surgery, you will not be allowed to drive home. You will need a responsible individual to drive you home and stay with you for 24 hours after surgery.   If you are taking public  transportation, you will need to have a responsible individual with you.  Please call the Pre-admissions Testing Dept. at [phone] if you have any questions about these instructions.  Surgery Visitation Policy:  Patients having surgery or a procedure may have two visitors.  Children under the age of 75 must have an adult with them who is not the patient.  Inpatient Visitation:    Visiting hours are 7 a.m. to 8 p.m. Up to four visitors are allowed at one time in a patient room. The visitors may rotate out with other people during the day.  One visitor age 34 or older may stay with the patient overnight and must be in the room by 8 p.m.         Preparing for Surgery with CHLORHEXIDINE GLUCONATE (CHG) Soap  Chlorhexidine Gluconate (CHG) Soap  o An antiseptic cleaner that kills germs and bonds with the skin to continue killing germs even after washing  o Used for showering the night before surgery and morning of surgery  Before surgery, you can play an important role by reducing the number of germs on your skin.  CHG (Chlorhexidine gluconate) soap is an antiseptic cleanser which kills germs and bonds with the skin to continue killing germs even after washing.  Please do not use if you have an allergy to CHG or antibacterial soaps. If your skin becomes reddened/irritated stop using the CHG.  1. Shower the NIGHT BEFORE SURGERY and the MORNING OF SURGERY with CHG soap.  2. If you choose to wash your hair, wash your hair first as usual with your normal shampoo.  3. After shampooing, rinse your hair and body thoroughly to remove the shampoo.  4. Use CHG as you would any other liquid soap. You can apply CHG directly to the skin and wash gently with a scrungie or a clean washcloth.  5. Apply the CHG soap to your body only from the neck down. Do not use on open wounds or open sores. Avoid contact with your eyes, ears, mouth, and genitals (private parts). Wash face and genitals  (private parts) with your normal soap.  6. Wash thoroughly, paying special attention to the area where your surgery will be performed.  7. Thoroughly rinse your body with warm water.  8. Do not shower/wash with your normal soap after using and rinsing off the CHG soap.  9. Pat yourself dry with a clean towel.  10. Wear clean pajamas to bed the night before surgery.  12. Place clean sheets on your bed the night of your first shower and do not sleep with pets.  13. Shower again with the CHG soap on the day of surgery prior to arriving at the hospital.  14. Do not apply any deodorants/lotions/powders.  15. Please wear clean clothes to the hospital.

## 2023-02-24 NOTE — Progress Notes (Signed)
Patient ID: Michele Newton, female   DOB: [birth year], 67 y.o.   MRN: [id number]  HPI Michele Newton is a 67 y.o. female in consultation at the request of Dr. Marcell Barlow ( case d/w him in detail).  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 The headaches are described as constant, with periods of lessening intensity, but often reaching a level where the patient feels her head is about to explode. No focal neurological deficits Did have a MRI that have personally showing increasing hydrocephalus with a right VP shunt in place. Also had CT of the chest for PE last year showing a moderate type III hiatal hernia and evidence of known shunt intraperitoneal. No complicating features. Has have a history of tubal ligation in the remote past.  She is able to perform more than 4 METS of activity without any shortness of breath or chest pain.  She is very active.  HPI  Past Medical History:  Diagnosis Date   Anxiety    Arthritis    Asthma    Depression    Frequent headaches    GERD (gastroesophageal reflux disease)    Hydrocephalus (HCC) 1995   Hypertension    Migraine    PONV (postoperative nausea and vomiting)    Psoriatic arthritis (HCC)     Past Surgical History:  Procedure Laterality Date   BREAST SURGERY  2000   biopsy   NASAL SINUS SURGERY  1998   TENDON REPAIR Left    hand   TONSILLECTOMY AND ADENOIDECTOMY  1985   TOTAL HIP ARTHROPLASTY Left 11/19/2018   Procedure: TOTAL HIP ARTHROPLASTY ANTERIOR APPROACH;  Surgeon: Kennedy Bucker, MD;  Location: [facility];  Service: Orthopedics;  Laterality: Left;   TOTAL HIP ARTHROPLASTY Right 07/27/2020   Procedure: TOTAL HIP ARTHROPLASTY ANTERIOR APPROACH;  Surgeon: Kennedy Bucker, MD;  Location: [facility];  Service:  Orthopedics;  Laterality: Right;   TOTAL HIP REVISION Right 12/25/2021   Procedure: Right hip revision, acetabular component & femoral head;  Surgeon: Kennedy Bucker, MD;  Location: [facility];  Service: Orthopedics;  Laterality: Right;   TUBAL LIGATION     TYMPANOSTOMY TUBE PLACEMENT  1998   VENTRICULAR ATRIAL SHUNT Right 1995    Family History  Problem Relation Age of Onset   Arthritis Mother    Hyperlipidemia Mother    Heart disease Mother 45       Died of heart failure   Hypertension Mother    Diabetes Mother    Depression Mother    Arthritis Father    Cancer Father        prostate cancer   Diabetes Brother    Breast cancer Maternal Grandmother    Colon cancer Neg Hx     Social History Social History   Tobacco Use   Smoking status: Never   Smokeless tobacco: Never  Vaping Use   Vaping status: Never Used  Substance Use Topics   Alcohol use: No   Drug use: No    Allergies  Allergen Reactions   Amitriptyline Other (See Comments)    hallucinations   Effexor [Venlafaxine] Other (See Comments)    Amnesia, hospitalized x 2 weeks   Haldol [Haloperidol] Other (See Comments)    Paralyzed vocal cords     Current Outpatient Medications  Medication Sig Dispense Refill   budesonide-formoterol (SYMBICORT) 160-4.5 MCG/ACT inhaler Inhale 2 puffs into the lungs 2 (two) times daily. 1 each 3   docusate sodium (COLACE) 100 MG capsule Take 1 capsule (100 mg total) by mouth 2 (two) times daily. 10 capsule 0   fluticasone (FLONASE) 50 MCG/ACT nasal spray Place 2 sprays into both nostrils daily. Use for 4-6 weeks then stop and use seasonally or as needed. 16 g 3   montelukast (SINGULAIR) 10 MG tablet TAKE 1 TABLET(10 MG) BY MOUTH AT BEDTIME 90 tablet 3   Nebulizers (COMPRESSOR/NEBULIZER) MISC 1 Units by Does not apply route every 4 (four) hours as needed. 1 each 0   pantoprazole (PROTONIX) 40 MG tablet TAKE 1 TABLET(40 MG) BY MOUTH TWICE DAILY 180 tablet 3   PARoxetine (PAXIL) 40 MG  tablet Take 1.5 tablets (60 mg total) by mouth every morning. 135 tablet 1   QUEtiapine (SEROQUEL) 200 MG tablet Take 1 tablet (200 mg total) by mouth at bedtime. 90 tablet 1   rizatriptan (MAXALT-MLT) 10 MG disintegrating tablet Take 1 tab (dissolve in mouth) at first sign of migraine headache. May repeat in 2 hours if needed for max dose in 24 hours. 10 tablet 0   traMADol (ULTRAM) 50 MG tablet Take 1 tablet (50 mg total) by mouth every 6 (six) hours as needed. 20 tablet 0   traZODone (DESYREL) 50 MG tablet TAKE 1/2 TO 1 TABLET(25 TO 50 MG) BY MOUTH AT BEDTIME AS NEEDED FOR SLEEP 90 tablet 1   verapamil (CALAN-SR) 180 MG CR tablet Take 1 tablet (180 mg total) by mouth daily. 90 tablet 1   No current facility-administered medications for this visit.     Review of Systems Full ROS  was asked and was negative except for the information on the HPI  Physical Exam Blood pressure (!) 144/83, pulse 76, temperature 97.6 F (36.4 C), temperature source Oral, height 5\' 6"  (1.676 m), weight 209 lb 6.4 oz (95 kg), SpO2 97%. CONSTITUTIONAL: NAD , alert. EYES: Pupils are equal, round,  Sclera are non-icteric. EARS, NOSE, MOUTH AND THROAT: The oropharynx is clear. The oral mucosa is pink and moist. Hearing is intact to voice. LYMPH NODES:  Lymph nodes in the neck are normal. RESPIRATORY:  Lungs are clear. There is normal respiratory effort, with equal breath sounds bilaterally, and without pathologic use of accessory muscles. CARDIOVASCULAR: Heart is regular without murmurs, gallops, or rubs. GI: The abdomen is  soft, nontender, and nondistended. There are no palpable masses. There is no hepatosplenomegaly. There are normal bowel sounds I, no abd hernias GU: Rectal deferred.   MUSCULOSKELETAL: Normal muscle strength and tone. No cyanosis or edema.   SKIN: Turgor is good and there are no pathologic skin lesions or ulcers.  I Can feel her VP shunt from the mastoid process all the way to the xiphoid to the  right of the midline, No obvious infection,  NEUROLOGIC: Motor and sensation is grossly normal. Cranial nerves are grossly intact. PSYCH:  Oriented to person, place and time. Affect is normal.  Data Reviewed  I have personally reviewed the patient's imaging, laboratory findings and medical records.    Assessment/Plan 67 year old female with symptomatic hydrocephalus in need for VP shunt.  I will be planning on accessing the abdominal cavity.  Procedure discussed with the patient in detail.  Risk, benefits and possible complications including but not limited to: Bleeding, infection, catheter malfunction, bowel injury, chronic pain, hernias.  She understands and wished to  proceed. She does have a known hiatal hernia that may need further w/u in the future. Her priority however is relieve of hydrocephalus. Please note that I spent 60 minutes in this encounter including personally reviewing imaging studies, medical record,  coordinating her care, counseling the patient, placing orders and performing documentation  Sterling Big, MD FACS General Surgeon 02/24/2023, 10:05 AM

## 2023-02-24 NOTE — Patient Instructions (Signed)
Our surgery scheduler Britta Mccreedy will call you within 24-48 hours to get you scheduled. If you have not heard from her after 48 hours, please call our office. Have the blue sheet available when she calls to write down important information.   If you have any concerns or questions, please feel free to call our office.

## 2023-02-25 ENCOUNTER — Other Ambulatory Visit: Payer: Medicare HMO

## 2023-02-25 MED ORDER — VANCOMYCIN HCL IN DEXTROSE 1-5 GM/200ML-% IV SOLN
1000.0000 mg | Freq: Once | INTRAVENOUS | Status: AC
  Administered 2023-02-26: 1000 mg via INTRAVENOUS

## 2023-02-25 MED ORDER — CEFAZOLIN IN SODIUM CHLORIDE 2-0.9 GM/100ML-% IV SOLN
2.0000 g | Freq: Once | INTRAVENOUS | Status: DC
  Filled 2023-02-25: qty 100

## 2023-02-25 MED ORDER — ORAL CARE MOUTH RINSE: 15.0000 mL | Freq: Once | OROMUCOSAL | Status: AC

## 2023-02-25 MED ORDER — LACTATED RINGERS IV SOLN: INTRAVENOUS | Status: DC

## 2023-02-25 MED ORDER — CHLORHEXIDINE GLUCONATE 0.12 % MT SOLN
15.0000 mL | Freq: Once | OROMUCOSAL | Status: AC
  Administered 2023-02-26: 15 mL via OROMUCOSAL

## 2023-02-25 NOTE — Plan of Care (Signed)
[facility] Tonsillectomy/Adenoidectomy, Postoperative PEDS care plan entered in error.

## 2023-02-26 ENCOUNTER — Inpatient Hospital Stay: Payer: Medicare HMO | Admitting: Urgent Care

## 2023-02-26 ENCOUNTER — Other Ambulatory Visit: Payer: Self-pay

## 2023-02-26 ENCOUNTER — Inpatient Hospital Stay
Admission: RE | Admit: 2023-02-26 | Discharge: 2023-02-27 | DRG: 032 | Disposition: A | Discharge status: Home / Self Care | Payer: Medicare HMO | Attending: Neurosurgery | Admitting: Neurosurgery

## 2023-02-26 ENCOUNTER — Encounter
Admission: RE | Disposition: A | Discharge status: Home / Self Care | Payer: Self-pay | Source: Home / Self Care | Attending: Neurosurgery

## 2023-02-26 ENCOUNTER — Inpatient Hospital Stay: Payer: Self-pay | Admitting: Certified Registered"

## 2023-02-26 ENCOUNTER — Encounter: Payer: Self-pay | Admitting: Neurosurgery

## 2023-02-26 ENCOUNTER — Inpatient Hospital Stay: Payer: Medicare HMO

## 2023-02-26 DIAGNOSIS — Z803 Family history of malignant neoplasm of breast: Secondary | ICD-10-CM | POA: Diagnosis not present

## 2023-02-26 DIAGNOSIS — K219 Gastro-esophageal reflux disease without esophagitis: Secondary | ICD-10-CM | POA: Diagnosis present

## 2023-02-26 DIAGNOSIS — Z7951 Long term (current) use of inhaled steroids: Secondary | ICD-10-CM | POA: Diagnosis not present

## 2023-02-26 DIAGNOSIS — Z888 Allergy status to other drugs, medicaments and biological substances status: Secondary | ICD-10-CM | POA: Diagnosis not present

## 2023-02-26 DIAGNOSIS — T85618A Breakdown (mechanical) of other specified internal prosthetic devices, implants and grafts, initial encounter: Secondary | ICD-10-CM

## 2023-02-26 DIAGNOSIS — J45909 Unspecified asthma, uncomplicated: Secondary | ICD-10-CM | POA: Diagnosis present

## 2023-02-26 DIAGNOSIS — I10 Essential (primary) hypertension: Secondary | ICD-10-CM | POA: Diagnosis present

## 2023-02-26 DIAGNOSIS — Z96643 Presence of artificial hip joint, bilateral: Secondary | ICD-10-CM | POA: Diagnosis present

## 2023-02-26 DIAGNOSIS — G43909 Migraine, unspecified, not intractable, without status migrainosus: Secondary | ICD-10-CM | POA: Diagnosis present

## 2023-02-26 DIAGNOSIS — L4050 Arthropathic psoriasis, unspecified: Secondary | ICD-10-CM | POA: Diagnosis present

## 2023-02-26 DIAGNOSIS — T8501XA Breakdown (mechanical) of ventricular intracranial (communicating) shunt, initial encounter: Secondary | ICD-10-CM | POA: Diagnosis present

## 2023-02-26 DIAGNOSIS — Z818 Family history of other mental and behavioral disorders: Secondary | ICD-10-CM | POA: Diagnosis not present

## 2023-02-26 DIAGNOSIS — F32A Depression, unspecified: Secondary | ICD-10-CM | POA: Diagnosis present

## 2023-02-26 DIAGNOSIS — Z83438 Family history of other disorder of lipoprotein metabolism and other lipidemia: Secondary | ICD-10-CM | POA: Diagnosis not present

## 2023-02-26 DIAGNOSIS — F419 Anxiety disorder, unspecified: Secondary | ICD-10-CM | POA: Diagnosis present

## 2023-02-26 DIAGNOSIS — G911 Obstructive hydrocephalus: Secondary | ICD-10-CM | POA: Diagnosis present

## 2023-02-26 DIAGNOSIS — G919 Hydrocephalus, unspecified: Secondary | ICD-10-CM | POA: Diagnosis present

## 2023-02-26 DIAGNOSIS — Z79899 Other long term (current) drug therapy: Secondary | ICD-10-CM | POA: Diagnosis not present

## 2023-02-26 DIAGNOSIS — Z833 Family history of diabetes mellitus: Secondary | ICD-10-CM | POA: Diagnosis not present

## 2023-02-26 DIAGNOSIS — Z8249 Family history of ischemic heart disease and other diseases of the circulatory system: Secondary | ICD-10-CM

## 2023-02-26 DIAGNOSIS — Y752 Prosthetic and other implants, materials and neurological devices associated with adverse incidents: Secondary | ICD-10-CM | POA: Diagnosis present

## 2023-02-26 DIAGNOSIS — Z01818 Encounter for other preprocedural examination: Secondary | ICD-10-CM

## 2023-02-26 DIAGNOSIS — Z8261 Family history of arthritis: Secondary | ICD-10-CM | POA: Diagnosis not present

## 2023-02-26 HISTORY — PX: SHUNT REVISION VENTRICULAR-PERITONEAL: SHX6094

## 2023-02-26 SURGERY — REVISION, SHUNT, VENTRICULOPERITONEAL
Anesthesia: General | Site: Head | Laterality: Right

## 2023-02-26 MED ORDER — GLYCOPYRROLATE 0.2 MG/ML IJ SOLN
INTRAMUSCULAR | Status: DC | PRN
  Administered 2023-02-26: .2 mg via INTRAVENOUS

## 2023-02-26 MED ORDER — DEXMEDETOMIDINE HCL IN NACL 200 MCG/50ML IV SOLN
INTRAVENOUS | Status: DC | PRN
  Administered 2023-02-26: 8 ug via INTRAVENOUS

## 2023-02-26 MED ORDER — ENOXAPARIN SODIUM 40 MG/0.4ML IJ SOSY
40.0000 mg | PREFILLED_SYRINGE | INTRAMUSCULAR | Status: DC
  Administered 2023-02-27: 40 mg via SUBCUTANEOUS

## 2023-02-26 MED ORDER — HYDROCODONE-ACETAMINOPHEN 5-325 MG PO TABS
ORAL_TABLET | ORAL | Status: AC
  Filled 2023-02-26: qty 1

## 2023-02-26 MED ORDER — CEFAZOLIN SODIUM-DEXTROSE 2-4 GM/100ML-% IV SOLN
2.0000 g | INTRAVENOUS | Status: AC
  Administered 2023-02-26: 2 g via INTRAVENOUS

## 2023-02-26 MED ORDER — ONDANSETRON HCL 4 MG/2ML IJ SOLN
INTRAMUSCULAR | Status: DC | PRN
  Administered 2023-02-26 (×2): 4 mg via INTRAVENOUS

## 2023-02-26 MED ORDER — ACETAMINOPHEN 650 MG RE SUPP: 650.0000 mg | RECTAL | Status: DC | PRN

## 2023-02-26 MED ORDER — NALOXONE HCL 0.4 MG/ML IJ SOLN: 0.0800 mg | INTRAMUSCULAR | Status: DC | PRN

## 2023-02-26 MED ORDER — ONDANSETRON HCL 4 MG PO TABS: 4.0000 mg | ORAL_TABLET | ORAL | Status: DC | PRN

## 2023-02-26 MED ORDER — LIDOCAINE-EPINEPHRINE 1 %-1:100000 IJ SOLN
INTRAMUSCULAR | Status: AC
  Filled 2023-02-26: qty 1

## 2023-02-26 MED ORDER — PANTOPRAZOLE SODIUM 40 MG PO TBEC
40.0000 mg | DELAYED_RELEASE_TABLET | Freq: Two times a day (BID) | ORAL | Status: DC
  Administered 2023-02-26 – 2023-02-27 (×2): 40 mg via ORAL

## 2023-02-26 MED ORDER — FENTANYL CITRATE (PF) 100 MCG/2ML IJ SOLN
INTRAMUSCULAR | Status: AC
  Filled 2023-02-26: qty 2

## 2023-02-26 MED ORDER — PAROXETINE HCL 30 MG PO TABS
60.0000 mg | ORAL_TABLET | Freq: Every day | ORAL | Status: DC
  Administered 2023-02-27: 60 mg via ORAL
  Filled 2023-02-26: qty 2

## 2023-02-26 MED ORDER — ACETAMINOPHEN 10 MG/ML IV SOLN
INTRAVENOUS | Status: AC
  Filled 2023-02-26: qty 100

## 2023-02-26 MED ORDER — TRAZODONE HCL 50 MG PO TABS
25.0000 mg | ORAL_TABLET | Freq: Every evening | ORAL | Status: DC | PRN
  Administered 2023-02-26: 25 mg via ORAL
  Filled 2023-02-26 (×2): qty 0.5

## 2023-02-26 MED ORDER — DOCUSATE SODIUM 100 MG PO CAPS
100.0000 mg | ORAL_CAPSULE | Freq: Two times a day (BID) | ORAL | Status: DC
  Administered 2023-02-26 – 2023-02-27 (×2): 100 mg via ORAL

## 2023-02-26 MED ORDER — BISACODYL 10 MG RE SUPP
10.0000 mg | Freq: Every day | RECTAL | Status: DC | PRN
Start: 2023-02-26 — End: 2023-02-27

## 2023-02-26 MED ORDER — SUCCINYLCHOLINE CHLORIDE 200 MG/10ML IV SOSY
PREFILLED_SYRINGE | INTRAVENOUS | Status: DC | PRN
  Administered 2023-02-26: 100 mg via INTRAVENOUS

## 2023-02-26 MED ORDER — MIDAZOLAM HCL 2 MG/2ML IJ SOLN
INTRAMUSCULAR | Status: AC
  Filled 2023-02-26: qty 2

## 2023-02-26 MED ORDER — GELATIN ABSORBABLE 12-7 MM EX MISC
CUTANEOUS | Status: AC
  Filled 2023-02-26: qty 1

## 2023-02-26 MED ORDER — OXYCODONE HCL 5 MG/5ML PO SOLN
ORAL | Status: AC
  Filled 2023-02-26: qty 5

## 2023-02-26 MED ORDER — OXYCODONE HCL 5 MG/5ML PO SOLN
5.0000 mg | Freq: Once | ORAL | Status: AC | PRN
Start: 2023-02-26 — End: 2023-02-26
  Administered 2023-02-26: 5 mg via ORAL

## 2023-02-26 MED ORDER — OXYCODONE HCL 5 MG PO TABS
5.0000 mg | ORAL_TABLET | Freq: Once | ORAL | Status: AC
  Administered 2023-02-26: 5 mg via ORAL

## 2023-02-26 MED ORDER — PANTOPRAZOLE SODIUM 40 MG PO TBEC
DELAYED_RELEASE_TABLET | ORAL | Status: AC
Start: 2023-02-26 — End: ?
  Filled 2023-02-26: qty 1

## 2023-02-26 MED ORDER — BUPIVACAINE-EPINEPHRINE (PF) 0.25% -1:200000 IJ SOLN
INTRAMUSCULAR | Status: AC
Start: 2023-02-26 — End: ?
  Filled 2023-02-26: qty 30

## 2023-02-26 MED ORDER — SENNA 8.6 MG PO TABS
1.0000 | ORAL_TABLET | Freq: Two times a day (BID) | ORAL | Status: DC
  Administered 2023-02-26 – 2023-02-27 (×2): 8.6 mg via ORAL

## 2023-02-26 MED ORDER — MORPHINE SULFATE (PF) 2 MG/ML IV SOLN
1.0000 mg | INTRAVENOUS | Status: AC | PRN
  Administered 2023-02-26 (×2): 2 mg via INTRAVENOUS

## 2023-02-26 MED ORDER — ACETAMINOPHEN 325 MG PO TABS
650.0000 mg | ORAL_TABLET | ORAL | Status: DC | PRN
  Administered 2023-02-27: 650 mg via ORAL

## 2023-02-26 MED ORDER — DEXAMETHASONE SODIUM PHOSPHATE 10 MG/ML IJ SOLN
INTRAMUSCULAR | Status: DC | PRN
  Administered 2023-02-26: 10 mg via INTRAVENOUS

## 2023-02-26 MED ORDER — QUETIAPINE FUMARATE 200 MG PO TABS
200.0000 mg | ORAL_TABLET | Freq: Every day | ORAL | Status: DC
  Administered 2023-02-26: 200 mg via ORAL
  Filled 2023-02-26: qty 1

## 2023-02-26 MED ORDER — MAGNESIUM CITRATE PO SOLN: 1.0000 | Freq: Once | ORAL | Status: DC | PRN

## 2023-02-26 MED ORDER — POLYETHYLENE GLYCOL 3350 17 G PO PACK: 17.0000 g | PACK | Freq: Every day | ORAL | Status: DC | PRN

## 2023-02-26 MED ORDER — SENNA 8.6 MG PO TABS
ORAL_TABLET | ORAL | Status: AC
Start: 2023-02-26 — End: ?
  Filled 2023-02-26: qty 1

## 2023-02-26 MED ORDER — LIDOCAINE-EPINEPHRINE 1 %-1:100000 IJ SOLN
INTRAMUSCULAR | Status: DC | PRN
  Administered 2023-02-26: 6 mL

## 2023-02-26 MED ORDER — HYDROCODONE-ACETAMINOPHEN 10-325 MG PO TABS
1.0000 | ORAL_TABLET | ORAL | Status: DC | PRN
  Administered 2023-02-26: 1 via ORAL

## 2023-02-26 MED ORDER — OXYCODONE HCL 5 MG PO TABS: 5.0000 mg | ORAL_TABLET | Freq: Once | ORAL | Status: AC | PRN

## 2023-02-26 MED ORDER — SUMATRIPTAN SUCCINATE 50 MG PO TABS
50.0000 mg | ORAL_TABLET | Freq: Once | ORAL | Status: AC | PRN
  Administered 2023-02-26: 50 mg via ORAL
  Filled 2023-02-26: qty 1

## 2023-02-26 MED ORDER — MONTELUKAST SODIUM 10 MG PO TABS
10.0000 mg | ORAL_TABLET | Freq: Every day | ORAL | Status: DC
  Administered 2023-02-26: 10 mg via ORAL
  Filled 2023-02-26: qty 1

## 2023-02-26 MED ORDER — MORPHINE SULFATE (PF) 2 MG/ML IV SOLN
INTRAVENOUS | Status: AC
  Filled 2023-02-26: qty 1

## 2023-02-26 MED ORDER — BACITRACIN ZINC 500 UNIT/GM EX OINT
TOPICAL_OINTMENT | CUTANEOUS | Status: DC | PRN
  Administered 2023-02-26: 1 via TOPICAL

## 2023-02-26 MED ORDER — VERAPAMIL HCL ER 180 MG PO TBCR
180.0000 mg | EXTENDED_RELEASE_TABLET | Freq: Every day | ORAL | Status: DC
  Administered 2023-02-26 – 2023-02-27 (×2): 180 mg via ORAL
  Filled 2023-02-26 (×2): qty 1

## 2023-02-26 MED ORDER — LIDOCAINE HCL (CARDIAC) PF 100 MG/5ML IV SOSY
PREFILLED_SYRINGE | INTRAVENOUS | Status: DC | PRN
  Administered 2023-02-26: 100 mg via INTRAVENOUS

## 2023-02-26 MED ORDER — HYDROCODONE-ACETAMINOPHEN 5-325 MG PO TABS
1.0000 | ORAL_TABLET | ORAL | Status: DC | PRN
  Administered 2023-02-26 – 2023-02-27 (×2): 1 via ORAL

## 2023-02-26 MED ORDER — SURGIFLO WITH THROMBIN (HEMOSTATIC MATRIX KIT) OPTIME
TOPICAL | Status: DC | PRN
  Administered 2023-02-26: 1 via TOPICAL

## 2023-02-26 MED ORDER — OXYCODONE HCL 5 MG PO TABS
ORAL_TABLET | ORAL | Status: AC
  Filled 2023-02-26: qty 1

## 2023-02-26 MED ORDER — MOMETASONE FURO-FORMOTEROL FUM 200-5 MCG/ACT IN AERO
2.0000 | INHALATION_SPRAY | Freq: Two times a day (BID) | RESPIRATORY_TRACT | Status: DC
  Administered 2023-02-26: 2 via RESPIRATORY_TRACT
  Filled 2023-02-26: qty 8.8

## 2023-02-26 MED ORDER — PROPOFOL 10 MG/ML IV BOLUS
INTRAVENOUS | Status: AC
  Filled 2023-02-26: qty 40

## 2023-02-26 MED ORDER — COMPRESSOR/NEBULIZER MISC: 1.0000 [IU] | Status: DC | PRN

## 2023-02-26 MED ORDER — ACETAMINOPHEN 10 MG/ML IV SOLN
INTRAVENOUS | Status: DC | PRN
  Administered 2023-02-26: 1000 mg via INTRAVENOUS

## 2023-02-26 MED ORDER — BUPIVACAINE LIPOSOME 1.3 % IJ SUSP
INTRAMUSCULAR | Status: AC
Start: 2023-02-26 — End: ?
  Filled 2023-02-26: qty 20

## 2023-02-26 MED ORDER — HYDROCODONE-ACETAMINOPHEN 10-325 MG PO TABS
ORAL_TABLET | ORAL | Status: AC
  Filled 2023-02-26: qty 1

## 2023-02-26 MED ORDER — DOCUSATE SODIUM 100 MG PO CAPS
ORAL_CAPSULE | ORAL | Status: AC
  Filled 2023-02-26: qty 1

## 2023-02-26 MED ORDER — PROMETHAZINE HCL 25 MG PO TABS: 12.5000 mg | ORAL_TABLET | ORAL | Status: DC | PRN

## 2023-02-26 MED ORDER — VANCOMYCIN HCL IN DEXTROSE 1-5 GM/200ML-% IV SOLN
INTRAVENOUS | Status: AC
  Filled 2023-02-26: qty 200

## 2023-02-26 MED ORDER — BUPIVACAINE-EPINEPHRINE (PF) 0.25% -1:200000 IJ SOLN
INTRAMUSCULAR | Status: DC | PRN
  Administered 2023-02-26: 40 mL via INTRAMUSCULAR

## 2023-02-26 MED ORDER — ROCURONIUM BROMIDE 100 MG/10ML IV SOLN
INTRAVENOUS | Status: DC | PRN
  Administered 2023-02-26: 20 mg via INTRAVENOUS
  Administered 2023-02-26: 30 mg via INTRAVENOUS
  Administered 2023-02-26: 20 mg via INTRAVENOUS

## 2023-02-26 MED ORDER — BACITRACIN ZINC 500 UNIT/GM EX OINT
TOPICAL_OINTMENT | CUTANEOUS | Status: AC
  Filled 2023-02-26: qty 28.35

## 2023-02-26 MED ORDER — PHENYLEPHRINE 80 MCG/ML (10ML) SYRINGE FOR IV PUSH (FOR BLOOD PRESSURE SUPPORT)
PREFILLED_SYRINGE | INTRAVENOUS | Status: DC | PRN
  Administered 2023-02-26: 240 ug via INTRAVENOUS

## 2023-02-26 MED ORDER — PHENYLEPHRINE HCL-NACL 20-0.9 MG/250ML-% IV SOLN
INTRAVENOUS | Status: DC | PRN
Start: 2023-02-26 — End: 2023-02-26
  Administered 2023-02-26: 15 ug/min via INTRAVENOUS

## 2023-02-26 MED ORDER — CHLORHEXIDINE GLUCONATE 0.12 % MT SOLN
OROMUCOSAL | Status: AC
  Filled 2023-02-26: qty 15

## 2023-02-26 MED ORDER — IRRISEPT - 450ML BOTTLE WITH 0.05% CHG IN STERILE WATER, USP 99.95% OPTIME
TOPICAL | Status: DC | PRN
  Administered 2023-02-26: 450 mL

## 2023-02-26 MED ORDER — CEFAZOLIN SODIUM-DEXTROSE 2-4 GM/100ML-% IV SOLN
INTRAVENOUS | Status: AC
  Filled 2023-02-26: qty 100

## 2023-02-26 MED ORDER — ONDANSETRON HCL 4 MG/2ML IJ SOLN: 4.0000 mg | INTRAMUSCULAR | Status: DC | PRN

## 2023-02-26 MED ORDER — FENTANYL CITRATE (PF) 100 MCG/2ML IJ SOLN
INTRAMUSCULAR | Status: DC | PRN
  Administered 2023-02-26 (×2): 50 ug via INTRAVENOUS
  Administered 2023-02-26: 100 ug via INTRAVENOUS

## 2023-02-26 MED ORDER — PROPOFOL 1000 MG/100ML IV EMUL
INTRAVENOUS | Status: AC
  Filled 2023-02-26: qty 200

## 2023-02-26 MED ORDER — 0.9 % SODIUM CHLORIDE (POUR BTL) OPTIME
TOPICAL | Status: DC | PRN
  Administered 2023-02-26: 500 mL

## 2023-02-26 MED ORDER — PROPOFOL 10 MG/ML IV BOLUS
INTRAVENOUS | Status: DC | PRN
  Administered 2023-02-26: 20 mg via INTRAVENOUS
  Administered 2023-02-26: 140 mg via INTRAVENOUS

## 2023-02-26 MED ORDER — LABETALOL HCL 5 MG/ML IV SOLN
10.0000 mg | INTRAVENOUS | Status: DC | PRN
Start: 2023-02-26 — End: 2023-02-27

## 2023-02-26 MED ORDER — FENTANYL CITRATE (PF) 100 MCG/2ML IJ SOLN
25.0000 ug | INTRAMUSCULAR | Status: DC | PRN
  Administered 2023-02-26: 25 ug via INTRAVENOUS
  Administered 2023-02-26 (×2): 50 ug via INTRAVENOUS

## 2023-02-26 SURGICAL SUPPLY — 79 items
5 PAIRS OF YELLOW SUTURE CLAMP (MISCELLANEOUS)
ADH SKN CLS APL DERMABOND .7 (GAUZE/BANDAGES/DRESSINGS) ×2
AGENT HMST KT MTR STRL THRMB (HEMOSTASIS) ×1
APL PRP STRL LF DISP 70% ISPRP (MISCELLANEOUS) ×3
BLADE CLIPPER SURG (BLADE) ×1 IMPLANT
BUR ACORN 7.5 PRECISION (BURR) IMPLANT
CHLORAPREP W/TINT 26 (MISCELLANEOUS) ×3 IMPLANT
CLAMP SUTURE YELLOW 5 PAIRS (MISCELLANEOUS) IMPLANT
CORD BIP STRL DISP 12FT (MISCELLANEOUS) IMPLANT
DEFOGGER SCOPE WARMER CLEARIFY (MISCELLANEOUS) ×2 IMPLANT
DERMABOND ADVANCED .7 DNX12 (GAUZE/BANDAGES/DRESSINGS) ×1 IMPLANT
DRAPE INCISE 23X17 STRL (DRAPES) ×1 IMPLANT
DRAPE INCISE IOBAN 23X17 STRL (DRAPES) IMPLANT
DRAPE INCISE IOBAN 66X45 STRL (DRAPES) ×1 IMPLANT
DRAPE LAPAROTOMY 100X77 ABD (DRAPES) IMPLANT
DRAPE POUCH INSTRU U-SHP 10X18 (DRAPES) IMPLANT
DRAPE SURG IRRIG POUCH 19X23 (DRAPES) IMPLANT
DRAPE WARM FLUID 44X44 (DRAPES) IMPLANT
DRSG TELFA 3X4 N-ADH STERILE (GAUZE/BANDAGES/DRESSINGS) ×2 IMPLANT
DRSG TELFA 3X8 NADH STRL (GAUZE/BANDAGES/DRESSINGS) ×1 IMPLANT
ELECT CAUTERY BLADE TIP 2.5 (TIP) ×1
ELECT COATED BLADE 2.86 ST (ELECTRODE) ×1 IMPLANT
ELECT REM PT RETURN 9FT ADLT (ELECTROSURGICAL) ×1
ELECTRODE CAUTERY BLDE TIP 2.5 (TIP) ×1 IMPLANT
ELECTRODE REM PT RTRN 9FT ADLT (ELECTROSURGICAL) ×1 IMPLANT
GAUZE 4X4 16PLY ~~LOC~~+RFID DBL (SPONGE) ×4 IMPLANT
GLOVE BIOGEL PI IND STRL 6.5 (GLOVE) ×1 IMPLANT
GLOVE SURG SYN 6.5 ES PF (GLOVE) ×3 IMPLANT
GLOVE SURG SYN 6.5 PF PI (GLOVE) ×3 IMPLANT
GLOVE SURG SYN 8.5 E (GLOVE) ×3 IMPLANT
GLOVE SURG SYN 8.5 PF PI (GLOVE) ×3 IMPLANT
GOWN SRG LRG LVL 4 IMPRV REINF (GOWNS) ×1 IMPLANT
GOWN STRL REIN LRG LVL4 (GOWNS) ×1
GOWN STRL REUS W/ TWL LRG LVL3 (GOWN DISPOSABLE) ×1 IMPLANT
GOWN STRL REUS W/TWL LRG LVL3 (GOWN DISPOSABLE) ×2
HEMOSTAT SURGICEL 2X14 (HEMOSTASIS) IMPLANT
HOLDER FOLEY CATH W/STRAP (MISCELLANEOUS) ×1 IMPLANT
JET LAVAGE IRRISEPT WOUND (IRRIGATION / IRRIGATOR) ×1
KIT TURNOVER KIT A (KITS) ×1 IMPLANT
L-HOOK LAP DISP 36CM (ELECTROSURGICAL)
LAVAGE JET IRRISEPT WOUND (IRRIGATION / IRRIGATOR) ×1 IMPLANT
LHOOK LAP DISP 36CM (ELECTROSURGICAL) IMPLANT
MANIFOLD NEPTUNE II (INSTRUMENTS) ×1 IMPLANT
MARKER SKIN DUAL TIP RULER LAB (MISCELLANEOUS) ×1 IMPLANT
NDL INSUFFLATION 14GA 120MM (NEEDLE) IMPLANT
NEEDLE INSUFFLATION 14GA 120MM (NEEDLE) IMPLANT
NS IRRIG 1000ML POUR BTL (IV SOLUTION) ×2 IMPLANT
NS IRRIG 500ML POUR BTL (IV SOLUTION) IMPLANT
PACK BASIN MINOR ARMC (MISCELLANEOUS) IMPLANT
PACK CRANIOTOMY CUSTOM (CUSTOM PROCEDURE TRAY) ×1 IMPLANT
PACK LAMINECTOMY ARMC (PACKS) IMPLANT
PAD ARMBOARD 7.5X6 YLW CONV (MISCELLANEOUS) ×2 IMPLANT
PAD PREP OB/GYN DISP 24X41 (PERSONAL CARE ITEMS) ×1 IMPLANT
PASSER CATH 65CM DISP (NEUROSURGERY SUPPLIES) IMPLANT
PASSER CATH SHUNT 55CM (INSTRUMENTS) IMPLANT
PATTIES SURGICAL .5 X3 (DISPOSABLE) IMPLANT
SET TUBE SMOKE EVAC HIGH FLOW (TUBING) ×1 IMPLANT
SHEATH PERITONEAL INTRO 61 (SHEATH) IMPLANT
SHEET NEURO XL SOL CTL (MISCELLANEOUS) IMPLANT
SLEEVE Z-THREAD 5X100MM (TROCAR) ×1 IMPLANT
STAPLER SKIN PROX 35W (STAPLE) ×2 IMPLANT
STYLET DISP PRECALIBRATE (MISCELLANEOUS) IMPLANT
SURGIFLO W/THROMBIN 8M KIT (HEMOSTASIS) IMPLANT
SUT MNCRL 4-0 (SUTURE) ×1
SUT MNCRL 4-0 27XMFL (SUTURE) ×1
SUT MNCRL AB 3-0 PS2 27 (SUTURE) IMPLANT
SUT PDS AB 0 CT1 27 (SUTURE) IMPLANT
SUT SILK 2 0SH CR/8 30 (SUTURE) ×2 IMPLANT
SUT VIC AB 2-0 CT1 18 (SUTURE) ×2 IMPLANT
SUTURE MNCRL 4-0 27XMF (SUTURE) ×2 IMPLANT
SYR 10ML LL (SYRINGE) ×2 IMPLANT
TAG SUTURE CLAMP YLW 5PR (MISCELLANEOUS)
TAPE CLOTH 3X10 WHT NS LF (GAUZE/BANDAGES/DRESSINGS) ×2 IMPLANT
TOWEL OR 17X26 4PK STRL BLUE (TOWEL DISPOSABLE) ×4 IMPLANT
TRAP FLUID SMOKE EVACUATOR (MISCELLANEOUS) ×1 IMPLANT
TRAY FOLEY SLVR 16FR LF STAT (SET/KITS/TRAYS/PACK) IMPLANT
TROCAR Z-THREAD FIOS 5X100MM (TROCAR) ×1 IMPLANT
VALVE PROGRAM HAKIM SYS (Valve) IMPLANT
WATER STERILE IRR 500ML POUR (IV SOLUTION) ×1 IMPLANT

## 2023-02-26 NOTE — Interval H&P Note (Signed)
History and Physical Interval Note:  02/26/2023 6:52 AM  Michele Newton  has presented today for surgery, with the diagnosis of G91.1 Obstructive hydrocephalus T85.618A Shunt malfunction.  The various methods of treatment have been discussed with the patient and family. After consideration of risks, benefits and other options for treatment, the patient has consented to  Procedure(s): VENTRICULOPERITONEAL SHUNT REVISION (N/A) as a surgical intervention.  The patient's history has been reviewed, patient examined, no change in status, stable for surgery.  I have reviewed the patient's chart and labs.  Questions were answered to the patient's satisfaction.    Heart sounds normal no MRG. Chest Clear to Auscultation Bilaterally.   [name]

## 2023-02-26 NOTE — Anesthesia Postprocedure Evaluation (Signed)
Anesthesia Post Note  Patient: Michele Newton  Procedure(s) Performed: VENTRICULOPERITONEAL SHUNT REVISION (Right: Head)  Patient location during evaluation: PACU Anesthesia Type: General Level of consciousness: awake and alert Pain management: pain level controlled Vital Signs Assessment: post-procedure vital signs reviewed and stable Respiratory status: spontaneous breathing, nonlabored ventilation, respiratory function stable and patient connected to nasal cannula oxygen Cardiovascular status: blood pressure returned to baseline and stable Postop Assessment: no apparent nausea or vomiting Anesthetic complications: no  No notable events documented.   Last Vitals:  Vitals:   02/26/23 1015 02/26/23 1030  BP: 103/72 129/78  Pulse: 83 81  Resp: 20 15  Temp:    SpO2: 93% 94%    Last Pain:  Vitals:   02/26/23 1030  TempSrc:   PainSc: 4                  Stephanie Coup

## 2023-02-26 NOTE — Transfer of Care (Signed)
Immediate Anesthesia Transfer of Care Note  Patient: Michele Newton  Procedure(s) Performed: VENTRICULOPERITONEAL SHUNT REVISION (Right: Head)  Patient Location: PACU  Anesthesia Type:General  Level of Consciousness: awake, drowsy, and patient cooperative  Airway & Oxygen Therapy: Patient Spontanous Breathing and Patient connected to face mask oxygen  Post-op Assessment: Report given to RN and Post -op Vital signs reviewed and stable  Post vital signs: Reviewed and stable  Last Vitals:  Vitals Value Taken Time  BP    Temp    Pulse 81 02/26/23 0921  Resp 19 02/26/23 0921  SpO2 97 % 02/26/23 0921  Vitals shown include unfiled device data.  Last Pain:  Vitals:   02/26/23 0619  TempSrc: Oral  PainSc: 1       Patients Stated Pain Goal: 0 (02/26/23 1610)  Complications: No notable events documented.

## 2023-02-26 NOTE — Anesthesia Preprocedure Evaluation (Signed)
Anesthesia Evaluation  Patient identified by MRN, date of birth, ID band Patient awake    Reviewed: Allergy & Precautions, H&P , NPO status , Patient's Chart, lab work & pertinent test results  History of Anesthesia Complications (+) PONV and history of anesthetic complications  Airway Mallampati: III  TM Distance: <3 FB Neck ROM: full    Dental  (+) Chipped, Dental Advidsory Given   Pulmonary neg shortness of breath, asthma    Pulmonary exam normal        Cardiovascular hypertension, Pt. on medications (-) angina (-) Past MI and (-) DOE Normal cardiovascular exam     Neuro/Psych  Headaches PSYCHIATRIC DISORDERS Anxiety     History of hydrocephalus that improved with shunting,  No active symptoms.  No problem with spinal in 2020.  Patient was consented for risk of shunt infection and or herniation and she voiced understanding    GI/Hepatic ,GERD  Medicated and Controlled,,  Endo/Other    Renal/GU      Musculoskeletal   Abdominal   Peds  Hematology negative hematology ROS (+)   Anesthesia Other Findings Past Medical History: No date: Anxiety No date: Arthritis No date: Asthma No date: Depression No date: Frequent headaches No date: GERD (gastroesophageal reflux disease) 1995: Hydrocephalus (HCC) No date: Migraine No date: Psoriatic arthritis (HCC)  Past Surgical History: 2000: BREAST SURGERY     Comment:  biopsy 1998: NASAL SINUS SURGERY 1985: TONSILLECTOMY AND ADENOIDECTOMY 11/19/2018: TOTAL HIP ARTHROPLASTY; Left     Comment:  Procedure: TOTAL HIP ARTHROPLASTY ANTERIOR APPROACH;                Surgeon: Kennedy Bucker, MD;  Location: [facility];                Service: Orthopedics;  Laterality: Left; No date: TUBAL LIGATION 1998: TYMPANOSTOMY TUBE PLACEMENT 1995: VENTRICULAR ATRIAL SHUNT; Right     Reproductive/Obstetrics                             Anesthesia  Physical Anesthesia Plan  ASA: 3  Anesthesia Plan: General ETT and General   Post-op Pain Management:    Induction: Intravenous  PONV Risk Score and Plan: 3 and Ondansetron and Dexamethasone  Airway Management Planned: Oral ETT  Additional Equipment:   Intra-op Plan:   Post-operative Plan: Extubation in OR  Informed Consent: I have reviewed the patients History and Physical, chart, labs and discussed the procedure including the risks, benefits and alternatives for the proposed anesthesia with the patient or authorized representative who has indicated his/her understanding and acceptance.     Dental Advisory Given  Plan Discussed with: Anesthesiologist, CRNA and Surgeon  Anesthesia Plan Comments: (Patient consented for risks of anesthesia including but not limited to:  - adverse reactions to medications - damage to eyes, teeth, lips or other oral mucosa - nerve damage due to positioning  - sore throat or hoarseness - Damage to heart, brain, nerves, lungs, other parts of body or loss of life  Patient voiced understanding and assent.)       Anesthesia Quick Evaluation

## 2023-02-26 NOTE — Op Note (Signed)
Indications: The patient is a 67yo female who presented with a shunt malfunction after being treated with a ventriculoperitoneal shunt for obstructive hydrocephalus.  Findings: shunt malfunction  Preoperative Diagnosis: obstructive hydrocephalus, shunt malfunction Postoperative Diagnosis: same   EBL: 50 ml IVF: see anesthesia record Drains: none Disposition: Extubated and Stable to PACU Complications: none  No foley catheter was placed.   Preoperative Note:   Risks of surgery discussed include: infection, bleeding, stroke, coma, death, paralysis, CSF leak, nerve/spinal cord injury, numbness, tingling, weakness, vascular injury, need for further surgery, persistent symptoms, and the risks of anesthesia. The patient understood these risks and agreed to proceed.  NAME OF PROCEDURE:               1. Right-sided removal of proximal shunt catheter and valve with replacement  PROCEDURE:  Patient was brought to the operating room, intubated.  The patient was positioned appropriately for ventriculoperitoneal shunt placement with bump under the right shoulder.  The prior incisions were identified and marked.  The operative site was prepped and draped in standard fashion.  A full timeout was performed.  Preoperative antibiotics were given.  The cranial incision was opened and extended along the posterior margin of the shunt valve until the shunt valve was able to be exposed.  The skin was secured with a self-retaining retractor.  The ventricular catheter as well as the valve were exposed.  The ventricular catheter was detached and noted to have some flow of spinal fluid.  There was cracking in the sidewall of the tubing.  There was not adequate flow of spinal fluid, thus ventriculoperitoneal shunt malfunction was identified.    At this point, the valve and distal catheter were identified and inspected.  The distal catheter was noted to be significantly scarred into position.  The distal catheter was  divided distal to the valve and the valve handed off.  A new ventricular peritoneal shunt system was then secured together with a 2-0 silk suture.  The tunneler was used to tunnel from the cranial incision to the abdominal area.  An incision was made over the tip of the passer, then the distal catheter was passed to the abdominal incision.  The valve was secured in position with a 2-0 silk suture.  The proximal catheter was removed and a new proximal catheter placed to depth of 6 cm.  He was noted to have brisk flow.  It was secured to the valve and a 2-0 silk tie and 2-0 silk suture were used to secure the catheter.  There was adequate flow through the distal tip of the distal catheter.    The cranial incision was then irrigated and closed using 2-0 Vicryl sutures and staples on the skin.  A sterile dressing was applied.  Dr. Everlene Farrier acted as co-surgeon for peritoneal access and placement of the peritoneal catheter.  He will dictate a separate note.    Needle, lap and all counts were correct at the end of the case.     Venetia Night MD Neurosurgery

## 2023-02-26 NOTE — Plan of Care (Signed)
  Problem: Activity: Goal: Risk for activity intolerance will decrease Outcome: Progressing   Problem: Elimination: Goal: Will not experience complications related to urinary retention Outcome: Progressing   

## 2023-02-26 NOTE — Anesthesia Procedure Notes (Signed)
Procedure Name: Intubation Date/Time: 02/26/2023 7:26 AM  Performed by: Mohammed Kindle, CRNAPre-anesthesia Checklist: Patient identified, Emergency Drugs available, Suction available and Patient being monitored Patient Re-evaluated:Patient Re-evaluated prior to induction Oxygen Delivery Method: Circle system utilized Preoxygenation: Pre-oxygenation with 100% oxygen Induction Type: IV induction Ventilation: Mask ventilation without difficulty Laryngoscope Size: McGraph and 3 Grade View: Grade I Tube type: Oral Number of attempts: 1 Airway Equipment and Method: Stylet Placement Confirmation: ETT inserted through vocal cords under direct vision, positive ETCO2, breath sounds checked- equal and bilateral and CO2 detector Secured at: 21 cm Tube secured with: Tape Dental Injury: Teeth and Oropharynx as per pre-operative assessment

## 2023-02-26 NOTE — Discharge Instructions (Signed)
NEUROSURGERY DISCHARGE INSTRUCTIONS  Admission diagnosis: G91.1 Obstructive hydrocephalus T85.618A Shunt malfunction  Operative procedure: Shunt revision  What to do after you leave the hospital:  Recommended diet: regular diet. Increase protein intake to promote wound healing.  Recommended activity: activity as tolerated. No driving for 6 weeks.You should walk multiple times per day  Special Instructions  No straining, no heavy lifting > 10lbs x 4 weeks.  Keep incision area clean and dry. May shower in 2 days. No baths or pools for 6 weeks.  Please remove dressing tomorrow, no need to apply a bandage afterwards  You have sutures or staples that will be removed in clinic.   Please take pain medications as directed. Take a stool softener if on pain medications   Please Report any of the following: Nausea or Vomiting, Temperature is greater than 101.14F (38.1C) degrees, Dizziness, Abdominal Pain, Difficulty Breathing or Shortness of Breath, Inability to Eat, drink Fluids, or Take medications, Bleeding, swelling, or drainage from surgical incision sites, New numbness or weakness, and Bowel or bladder dysfunction to the neurosurgeon on call. How to contact us:  If you have any questions/concerns before or after surgery, you can reach [place] at [phone], or you can send a mychart message. We can be reached by phone or mychart 8am-4pm, Monday-Friday.  *Please note: Calls after 4pm are forwarded to a third party answering service. Mychart messages are not routinely monitored during evenings, weekends, and holidays. Please call our office to contact the answering service for urgent concerns during non-business hours.   Additional Follow up appointments Please follow up with Manning Charity PA-C as scheduled in 2-3 weeks   Please see below for scheduled appointments:  Future Appointments  Date Time Provider Department Center  03/11/2023  2:30 PM Susanne Borders, [place] [facility] None  04/08/2023   2:30 PM Venetia Night, MD [facility] None  05/02/2023  9:40 AM Lorre Munroe, NP [facility]  05/20/2023  1:30 PM Susanne Borders, PA [facility] None  08/08/2023 12:30 PM Sherrie George, MD [facility] None  11/13/2023  3:00 PM [facility]

## 2023-02-26 NOTE — Op Note (Signed)
PROCEDURES: Right-sided removal of proximal shunt catheter and valve with replacement   Pre-operative Diagnosis:obstructive hydrocephalus w malfunctioning VP shunt  Post-operative Diagnosis: Same  Surgeon: Merri Ray Agness Sibrian   Anesthesia: General endotracheal anesthesia  ASA Class: 2  Surgeon: Sterling Big , MD FACS  Anesthesia: Gen. with endotracheal tube  Findings: Some adhesion from prior shunt around fascia and peritoneum, no evidence of bowel injuries or other solid organ injuries  Estimated Blood Loss: 5cc for my portion       Complications: none         Condition: stable  Procedure Details  The patient was seen again in the Holding Room. The benefits, complications, treatment options, and expected outcomes were discussed with the patient. The risks of bleeding, infection, recurrence of symptoms, failure to resolve symptoms,  bowel injury, any of which could require further surgery were reviewed with the patient.   The patient was taken to Operating Room, identified as Michele Newton and the procedure verified.  A Time Out was held and the above information confirmed.  Prior to the induction of general anesthesia, antibiotic prophylaxis was administered. VTE prophylaxis was in place. General endotracheal anesthesia was then administered and tolerated well. After the induction, the abdomen was prepped with Chloraprep and draped in the sterile fashion. The patient was positioned in the supine position. Dr. Marcell Barlow started the case in the cranial aspect.  Please see his separate operative report.  He was able to tunnel the catheter into subcutaneous tissue from the head to the subxiphoid region. I grabbed the tip of the catheter and extended the subxiphoid incision using a 15 blade knife.  Electrocautery was used to dissect through subcutaneous tissue and the linea alba identified.  The fascia was elevated between 2 clamps and the fascia was incised sharply.  There was some adhesions from  prior VP shunt and there was some adhesions from the liver to the abdominal wall.  They were bluntly dissected.  We were able to find a good window intraperitoneally to place the catheter through.  We confirmed that the catheter was in fact intraperitoneal.  No evidence of any kinks or any intra-abdominal injuries. The fascia was closed with a 0 PDS suture in a running fashion and the skin was closed with 4-0 Monocryl. Liposomal Marcaine  was injected on all incision sites under direct visualization. Dermabond was used to coat all the skin incisions. Needle and laparotomy count were correct and there were no immediate complications.  Sterling Big, MD, FACS

## 2023-02-27 ENCOUNTER — Encounter: Payer: Self-pay | Admitting: Neurosurgery

## 2023-02-27 MED ORDER — PANTOPRAZOLE SODIUM 40 MG PO TBEC
DELAYED_RELEASE_TABLET | ORAL | Status: AC
Start: 2023-02-27 — End: ?
  Filled 2023-02-27: qty 1

## 2023-02-27 MED ORDER — DOCUSATE SODIUM 100 MG PO CAPS
ORAL_CAPSULE | ORAL | Status: AC
Start: 2023-02-27 — End: ?
  Filled 2023-02-27: qty 1

## 2023-02-27 MED ORDER — HYDROCODONE-ACETAMINOPHEN 5-325 MG PO TABS: 1.0000 | ORAL_TABLET | ORAL | 0 refills | Status: AC | PRN

## 2023-02-27 MED ORDER — ACETAMINOPHEN 325 MG PO TABS
ORAL_TABLET | ORAL | Status: AC
Start: 2023-02-27 — End: ?
  Filled 2023-02-27: qty 2

## 2023-02-27 MED ORDER — ENOXAPARIN SODIUM 40 MG/0.4ML IJ SOSY
PREFILLED_SYRINGE | INTRAMUSCULAR | Status: AC
Start: 2023-02-27 — End: ?
  Filled 2023-02-27: qty 0.4

## 2023-02-27 MED ORDER — SENNA 8.6 MG PO TABS
ORAL_TABLET | ORAL | Status: AC
Start: 2023-02-27 — End: ?
  Filled 2023-02-27: qty 1

## 2023-02-27 MED ORDER — HYDROCODONE-ACETAMINOPHEN 5-325 MG PO TABS
ORAL_TABLET | ORAL | Status: AC
  Filled 2023-02-27: qty 1

## 2023-02-27 NOTE — Plan of Care (Signed)
  Problem: Activity: Goal: Risk for activity intolerance will decrease Outcome: Progressing   Problem: Pain Management: Goal: General experience of comfort will improve Outcome: Progressing

## 2023-02-27 NOTE — Progress Notes (Signed)
   Neurosurgery Progress Note  History: Michele Newton is s/p Right-sided removal of proximal shunt catheter and valve with replacement for shunt failure and worsening hydrocephalus  POD1: The patient reports doing well this morning.  She does have some soreness in her lower abdomen and hips.  She reports having done very well with physical therapy and is eager to go home.  Physical Exam: Vitals:   02/27/23 0540 02/27/23 0753  BP: (!) 143/66 (!) 140/64  Pulse: 73 76  Resp: 16 15  Temp: 99.3 F (37.4 C) 98.4 F (36.9 C)  SpO2: 94% 96%    AA Ox3 CNI  Strength:5/5 throughout   Incision c/d/I with staples in place  Data:  Other tests/results:  Shunt series 02/26/23 skull FINDINGS: Interval placement of a right temporal approach ventriculoperitoneal shunt catheter. The tip of the shunt catheter projects over the anatomic midline of the calvarium. The shunt tubing is intact and extends inferiorly along the right neck and upper chest. There is old shunt tubing present which appears abandoned and is discontinuous terminating at the level of the mastoid process.   IMPRESSION: Placement of a new right frontotemporal approach ventriculoperitoneal shunt.   The tip of the shunt is well position in the anatomic midline.  cervical  FINDINGS: Newly placed ventriculoperitoneal shunt tubing courses through the soft tissues of the right neck and right upper chest. Old abandoned shunt tubing is also present and runs parallel with new tubing. No evidence of discontinuity. Chronic bronchitic changes visualized in the lower lungs.   IMPRESSION: Ventriculoperitoneal shunt tubing courses in the superficial soft tissues of the right neck and right upper chest.   The new shunt tubing runs roughly parallel with a segment of old abandoned shunt tubing.  chest FINDINGS: Ventriculoperitoneal shunt catheter tubing courses through the soft tissues of the right neck and along the anterior  chest roughly parallel with a segment of old abandoned shunt tubing. No evidence of discontinuity or kinking of the newly placed shunt tubing.   Cardiomegaly and mild pulmonary vascular congestion without overt edema. Inspiratory volumes are low.   IMPRESSION: Newly placed ventriculoperitoneal shunt tubing courses along the soft tissues of the right lower neck and the right anterior chest. The new tubing runs roughly parallel with a segment of old abandoned shunt tubing.  Abdomen FINDINGS: Ventriculoperitoneal shunt catheter tubing is coiled in the right upper quadrant. Tubing is in a similar location to the previously abandoned shunt tubing. The bowel gas pattern is not obstructed. Surgical changes of prior bilateral hip arthroplasties are noted. Multilevel degenerative disease throughout the visualized spine.   IMPRESSION: Newly placed shunt catheter tubing coiled in the right upper quadrant of the abdomen.   The new shunt catheter tubing is in close proximity to the prior abandoned shunt tubing.     Electronically Signed   By: Malachy Moan M.D.   On: 02/26/2023 13:47  Assessment/Plan:  Michele Newton is a 67 y.o with a history of obstructive hydrocephalus s/p VP shunt placement in the 90's presenting with several months of worsening headaches. Further work up revealed signs of shunt failure and worsening hydrocephalus s/p right-sided removal of proximal shunt catheter and valve with replacement. Shunt confirmed to be set to 120 mmH20  - mobilize - pain control - DVT prophylaxis - PTOT; dispo planning underway. No needs   Manning Charity PA-C Department of Neurosurgery

## 2023-02-27 NOTE — Plan of Care (Signed)
  Problem: Coping: Goal: Level of anxiety will decrease Outcome: Progressing   Problem: Pain Management: Goal: General experience of comfort will improve Outcome: Progressing   Problem: Safety: Goal: Ability to remain free from injury will improve Outcome: Progressing

## 2023-02-27 NOTE — Evaluation (Signed)
Physical Therapy Evaluation Patient Details Name: Michele Newton MRN: [id number] DOB: [birth year] Today's Date: 02/27/2023  History of Present Illness  Patient is a 67 y.o. female, presenting with increasing headaches over the past few months. PMH includes a ventricular shunt placement in 1995, hydrocephalus, anxiety, GERD, and HTN. Current MD assessment: Obstructive hydrocephalus shunt malfunction, she is now s/p Ventriculoperitoneal shunt revision  Clinical Impression  Pt was pleasant and motivated to participate during the session and put forth good effort throughout. Pt is currently Ind/Mod I for all mobility and function assessed. She is able to perform 200 foot bout of amb with no fatigue or imbalance, performed stairs halfway through amb, able to do 4 steps with single UE use on hand rails. Ascending with alternating pattern, descending with step-to pattern. Pt showing good control and confidence with all mobility performed as well. Pt has no concerns, no skilled PT needs identified at this time with pt agreeing, pt has no DME needs. Will complete PT orders at this time but will reassess pt pending a change in status upon receipt of new PT orders.          If plan is discharge home, recommend the following: Assist for transportation   Can travel by private vehicle        Equipment Recommendations None recommended by PT  Recommendations for Other Services       Functional Status Assessment Patient has not had a recent decline in their functional status     Precautions / Restrictions Precautions Precautions: Fall Restrictions Weight Bearing Restrictions: No      Mobility  Bed Mobility Overal bed mobility: Independent                  Transfers Overall transfer level: Independent                 General transfer comment: Performs STS's with ease, good control and speed.    Ambulation/Gait    Gait Distance (Feet): 200 Feet, Independently  Assistive  device: None Gait Pattern/deviations: WFL(Within Functional Limits), Step-through pattern Gait velocity: normal     General Gait Details: Pt walking with steady steps, good control with gait quality overall WFL. No SOB or imbalance noted, able to perform stairs halfway through amb.  Stairs Stairs: Yes Stairs assistance: Modified independent (Device/Increase time) Stair Management: One rail Left, Step to pattern, Alternating pattern Number of Stairs: 4 General stair comments: good control and confidence with single UE support, alternating pattern ascending, step-to pattern descending.  Wheelchair Mobility     Tilt Bed    Modified Rankin (Stroke Patients Only)       Balance Overall balance assessment: No apparent balance deficits (not formally assessed)                                           Pertinent Vitals/Pain Pain Assessment Pain Assessment: Faces Faces Pain Scale: Hurts a little bit Pain Location: Sucgical sites, R side of neck, chest Pain Descriptors / Indicators: Sore Pain Intervention(s): Monitored during session    Home Living Family/patient expects to be discharged to:: Private residence Living Arrangements: Spouse/significant other Available Help at Discharge: Family;Available 24 hours/day;Neighbor Type of Home: House Home Access: Stairs to enter Entrance Stairs-Rails: Right;Left (unable to reach both at same time) Entrance Stairs-Number of Steps: 4   Home Layout: Two level;Able to live on main level with bedroom/bathroom  Home Equipment: Agricultural consultant (2 wheels);Rollator (4 wheels);Cane - single point;Shower seat;Grab bars - tub/shower      Prior Function Prior Level of Function : Independent/Modified Independent             Mobility Comments: Ind, able to walk community distances without needing rest break; no use of AD. no histroy of falls ADLs Comments: Ind for bathing and bathroom use     Extremity/Trunk Assessment    Upper Extremity Assessment Upper Extremity Assessment: Overall WFL for tasks assessed    Lower Extremity Assessment Lower Extremity Assessment: Overall WFL for tasks assessed    Cervical / Trunk Assessment Cervical / Trunk Assessment: Normal  Communication   Communication Communication: No apparent difficulties  Cognition Arousal: Alert Behavior During Therapy: WFL for tasks assessed/performed Overall Cognitive Status: Within Functional Limits for tasks assessed                                          General Comments      Exercises     Assessment/Plan    PT Assessment Patient does not need any further PT services  PT Problem List         PT Treatment Interventions      PT Goals (Current goals can be found in the Care Plan section)  Acute Rehab PT Goals PT Goal Formulation: All assessment and education complete, DC therapy    Frequency       Co-evaluation               AM-PAC PT "6 Clicks" Mobility  Outcome Measure Help needed turning from your back to your side while in a flat bed without using bedrails?: None Help needed moving from lying on your back to sitting on the side of a flat bed without using bedrails?: None Help needed moving to and from a bed to a chair (including a wheelchair)?: None Help needed standing up from a chair using your arms (e.g., wheelchair or bedside chair)?: None Help needed to walk in hospital room?: None Help needed climbing 3-5 steps with a railing? : None 6 Click Score: 24    End of Session   Activity Tolerance: Patient tolerated treatment well Patient left: in bed;with family/visitor present (seated EOB) Nurse Communication: Mobility status PT Visit Diagnosis: Other abnormalities of gait and mobility (R26.89)    Time: 4403-4742 PT Time Calculation (min) (ACUTE ONLY): 19 min   Charges:                 Cecile Sheerer, SPT 02/27/23, 2:33 PM

## 2023-02-27 NOTE — Progress Notes (Signed)
DISCHARGE NOTE:  Pt given discharge instructions and verbalized understanding. Pt wheeled to car by staff, husband providing transportation.  

## 2023-02-27 NOTE — Discharge Summary (Signed)
Discharge Summary  Patient ID: Michele Newton MRN: [id number] DOB/AGE: [birth year] 67 y.o.  Admit date: 02/26/2023 Discharge date: 02/27/2023  Admission Diagnoses: Shunt malfunction with v/p shunt for obstructive hydrocephalus  Discharge Diagnoses:  Principal Problem:   Hydrocephalus [facility]) Active Problems:   Shunt malfunction   Discharged Condition: good  Hospital Course:  Norrene Whalley is a 67 y.o s/p Right-sided removal of proximal shunt catheter and valve with replacement for shunt failure and worsening hydrocephalus.  Operative course was uncomplicated.  She is admitted overnight for monitoring and therapy evaluation.  She was seen and evaluated by therapy and deemed appropriate for discharge home on postop day 1 without further needs.  She was given Norco to take as needed for pain.  Consults: None  Significant Diagnostic Studies:  Shunt series. Please see radiology read for further details  Treatments: surgery: As above.  Please see separately dictated operative report for further details.  Discharge Exam: Blood pressure (!) 140/64, pulse 76, temperature 98.4 F (36.9 C), resp. rate 15, height 5\' 6"  (1.676 m), weight 97.8 kg, SpO2 96%. AA Ox3 CNI   Strength:5/5 throughout    Incision c/d/I with staples in place  Disposition: Discharge disposition: 01-Home or Self Care       Discharge Instructions     Diet - low sodium heart healthy   Complete by: As directed       Allergies as of 02/27/2023       Reactions   Amitriptyline Other (See Comments)   hallucinations   Effexor [venlafaxine] Other (See Comments)   Amnesia, hospitalized x 2 weeks   Haldol [haloperidol] Other (See Comments)   Paralyzed vocal cords         Medication List     STOP taking these medications    traMADol 50 MG tablet Commonly known as: ULTRAM       TAKE these medications    budesonide-formoterol 160-4.5 MCG/ACT inhaler Commonly known as: SYMBICORT Inhale 2 puffs into  the lungs 2 (two) times daily.   Compressor/Nebulizer Misc 1 Units by Does not apply route every 4 (four) hours as needed.   docusate sodium 100 MG capsule Commonly known as: COLACE Take 1 capsule (100 mg total) by mouth 2 (two) times daily.   HYDROcodone-acetaminophen 5-325 MG tablet Commonly known as: NORCO/VICODIN Take 1 tablet by mouth every 4 (four) hours as needed for up to 3 days for moderate pain (pain score 4-6).   montelukast 10 MG tablet Commonly known as: SINGULAIR TAKE 1 TABLET(10 MG) BY MOUTH AT BEDTIME   pantoprazole 40 MG tablet Commonly known as: PROTONIX TAKE 1 TABLET(40 MG) BY MOUTH TWICE DAILY   PARoxetine 40 MG tablet Commonly known as: Paxil Take 1.5 tablets (60 mg total) by mouth every morning.   QUEtiapine 200 MG tablet Commonly known as: SEROQUEL Take 1 tablet (200 mg total) by mouth at bedtime.   rizatriptan 10 MG disintegrating tablet Commonly known as: Maxalt-MLT Take 1 tab (dissolve in mouth) at first sign of migraine headache. May repeat in 2 hours if needed for max dose in 24 hours.   traZODone 50 MG tablet Commonly known as: DESYREL TAKE 1/2 TO 1 TABLET(25 TO 50 MG) BY MOUTH AT BEDTIME AS NEEDED FOR SLEEP   verapamil 180 MG CR tablet Commonly known as: CALAN-SR Take 1 tablet (180 mg total) by mouth daily.         Signed: Susanne Borders 02/27/2023, 9:54 AM

## 2023-03-11 ENCOUNTER — Ambulatory Visit (INDEPENDENT_AMBULATORY_CARE_PROVIDER_SITE_OTHER): Payer: Medicare HMO | Admitting: Neurosurgery

## 2023-03-11 VITALS — BP 130/88 | Temp 98.2°F

## 2023-03-11 DIAGNOSIS — G911 Obstructive hydrocephalus: Secondary | ICD-10-CM | POA: Diagnosis not present

## 2023-03-11 NOTE — Progress Notes (Signed)
   REFERRING PHYSICIAN:  Daryel Gerald [address] [place],  [place] [address]  DOS: 02/26/23 VP shunt revision  HISTORY OF PRESENT ILLNESS: Michele Newton is about 2 weeks status post VP shunt revision. Overall, she is doing very well since surgery.  Her preoperative headaches have resolved.  She does have some tenderness around her scalp incision as well as some right flank tenderness but overall things are improving.  She denies any incisional concerns.  PHYSICAL EXAMINATION:  NEUROLOGICAL:  General: In no acute distress.   Awake, alert, oriented to person, place, and time.  Pupils equal round and reactive to light.  Facial tone is symmetric.  Tongue protrusion is midline.  There is no pronator drift.  Strength: Side Biceps Triceps Deltoid Interossei Grip Wrist Ext. Wrist Flex.  R 5 5 5 5 5 5 5   L 5 5 5 5 5 5 5    Incisions c/d/I and healing well   Imaging:  No interval imaging to review  Assessment / Plan: Michele Newton is doing well after VP shunt revision. We discussed activity escalation and I have advised the patient to lift up to 10 pounds until 6 weeks after surgery, then increase up to 25 pounds until 12 weeks after surgery.  After 12 weeks post-op, the patient advised to increase activity as tolerated. she will return to clinic in approximately 4 weeks to see Dr. Myer Haff or sooner should she have any questions or concerns.   Advised to contact the office if any questions or concerns arise.   Manning Charity PA-C Dept of Neurosurgery

## 2023-04-08 ENCOUNTER — Ambulatory Visit (INDEPENDENT_AMBULATORY_CARE_PROVIDER_SITE_OTHER): Payer: Medicare HMO | Admitting: Neurosurgery

## 2023-04-08 ENCOUNTER — Encounter: Payer: Self-pay | Admitting: Neurosurgery

## 2023-04-08 VITALS — BP 118/78 | Temp 97.8°F | Ht 66.0 in | Wt 215.0 lb

## 2023-04-08 DIAGNOSIS — G911 Obstructive hydrocephalus: Secondary | ICD-10-CM

## 2023-04-08 DIAGNOSIS — Z09 Encounter for follow-up examination after completed treatment for conditions other than malignant neoplasm: Secondary | ICD-10-CM

## 2023-04-08 NOTE — Addendum Note (Signed)
Addended by: Sharlot Gowda on: 04/08/2023 03:12 PM   Modules accepted: Orders

## 2023-04-08 NOTE — Progress Notes (Signed)
   REFERRING PHYSICIAN:  Daryel Gerald [address] [place],  [place] [address]  DOS: 02/26/23 VP shunt revision  HISTORY OF PRESENT ILLNESS: Michele Newton is status post VP shunt revision.   She is still having some headaches.  They are approximately how they were prior to surgery at this point.   PHYSICAL EXAMINATION:  NEUROLOGICAL:  General: In no acute distress.   Awake, alert, oriented to person, place, and time.  Pupils equal round and reactive to light.  Facial tone is symmetric.  Tongue protrusion is midline.  There is no pronator drift.  Strength: Side Biceps Triceps Deltoid Interossei Grip Wrist Ext. Wrist Flex.  R 5 5 5 5 5 5 5   L 5 5 5 5 5 5 5    Incisions c/d/I and healing well   Imaging:  No interval imaging to review  Assessment / Plan: Michele Newton is doing fair after VP shunt revision.  Her abdominal discomfort has improved.  I have released her to go back to normal activities.  Given that she has had a decline in efficacy of her shunt, I would like to evaluate with shunt x-rays and a CT scan.  It is possible that we will need to adjust how much she is draining.    Venetia Night MD Dept of Neurosurgery

## 2023-04-11 ENCOUNTER — Other Ambulatory Visit: Payer: Self-pay | Admitting: Internal Medicine

## 2023-04-11 DIAGNOSIS — G4709 Other insomnia: Secondary | ICD-10-CM

## 2023-04-11 NOTE — Telephone Encounter (Signed)
Requested medication (s) are due for refill today - no  Requested medication (s) are on the active medication list -yes  Future visit scheduled -yes  Last refill: 01/14/23 #90 1RF  Notes to clinic: non delegated Rx  Requested Prescriptions  Pending Prescriptions Disp Refills   QUEtiapine (SEROQUEL) 200 MG tablet [Pharmacy Med Name: QUETIAPINE 200MG  TABLETS] 90 tablet 1    Sig: TAKE 1 TABLET(200 MG) BY MOUTH AT BEDTIME     Not Delegated - Psychiatry:  Antipsychotics - Second Generation (Atypical) - quetiapine Failed - 04/11/2023  9:08 AM      Failed - This refill cannot be delegated      Failed - TSH in normal range and within 360 days    TSH  Date Value Ref Range Status  04/23/2021 1.21 0.40 - 4.50 mIU/L Final         Failed - Lipid Panel in normal range within the last 12 months    Cholesterol  Date Value Ref Range Status  05/01/2022 340 (H) <200 mg/dL Final   LDL Cholesterol (Calc)  Date Value Ref Range Status  05/01/2022 245 (H) mg/dL (calc) Final    Comment:    LDL-C levels > or = 190 mg/dL may indicate familial  hypercholesterolemia (FH). Clinical assessment and  measurement of blood lipid levels should be  considered for all first degree relatives of patients with an FH diagnosis. LDL Cholesterol (LDL-C) levels > or = 300 mg/dL may indicate homozygous familial hypercholesterolemia (HoFH). Untreated,  these extremely high LDL-C levels can result in premature CV events and mortality. Patients should be identified early and provided appropriate interventions to reduce the cumulative LDL-C burden from birth. . For questions about testing for familial hypercholesterolemia, please call Engineer, materials at [email or web address]. Wardell Honour, et al. J National Lipid Association  Recommendations for Patient-Centered Management of Dyslipidemia: Part 1 Journal of  Clinical Lipidology 2015;9(2), 129-169. Cuchel, M. et al. (2014). Homozygous  familial hypercholesterolaemia: new insights and guidance for clinicians to improve detection and clinical management. European Heart Journal, 35(32), 4438523003. Reference range: <100 . Desirable range <100 mg/dL for primary prevention;   <70 mg/dL for patients with CHD or diabetic patients  with > or = 2 CHD risk factors. Marland Kitchen LDL-C is now calculated using the Martin-Hopkins  calculation, which is a validated novel method providing  better accuracy than the Friedewald equation in the  estimation of LDL-C.  Horald Pollen et al. Lenox Ahr. 5409;811(91): 2061-2068  (http://education.[facility].com/faq/FAQ164)    HDL  Date Value Ref Range Status  05/01/2022 45 (L) > OR = 50 mg/dL Final   Triglycerides  Date Value Ref Range Status  05/01/2022 295 (H) <150 mg/dL Final    Comment:    . If a non-fasting specimen was collected, consider repeat triglyceride testing on a fasting specimen if clinically indicated.  Perry Mount et al. J. of Clin. Lipidol. 2015;9:129-169. .          Failed - CBC within normal limits and completed in the last 12 months    WBC  Date Value Ref Range Status  02/24/2023 7.6 4.0 - 10.5 K/uL Final   RBC  Date Value Ref Range Status  02/24/2023 5.30 (H) 3.87 - 5.11 MIL/uL Final   Hemoglobin  Date Value Ref Range Status  02/24/2023 14.6 12.0 - 15.0 g/dL Final   HCT  Date Value Ref Range Status  02/24/2023 45.3 36.0 - 46.0 % Final   MCHC  Date Value Ref Range Status  02/24/2023  32.2 30.0 - 36.0 g/dL Final   [facility]  Date Value Ref Range Status  02/24/2023 27.5 26.0 - 34.0 pg Final   MCV  Date Value Ref Range Status  02/24/2023 85.5 80.0 - 100.0 fL Final   No results found for: "PLTCOUNTKUC", "LABPLAT", "POCPLA" RDW  Date Value Ref Range Status  02/24/2023 13.3 11.5 - 15.5 % Final         Failed - CMP within normal limits and completed in the last 12 months    Albumin  Date Value Ref Range Status  03/07/2022 4.1 3.5 - 5.0 g/dL Final   Alkaline  Phosphatase  Date Value Ref Range Status  03/07/2022 103 38 - 126 U/L Final   Alkaline phosphatase (APISO)  Date Value Ref Range Status  05/01/2022 108 37 - 153 U/L Final   ALT  Date Value Ref Range Status  05/01/2022 30 (H) 6 - 29 U/L Final   AST  Date Value Ref Range Status  05/01/2022 22 10 - 35 U/L Final   BUN  Date Value Ref Range Status  02/24/2023 14 8 - 23 mg/dL Final   Calcium  Date Value Ref Range Status  02/24/2023 9.0 8.9 - 10.3 mg/dL Final   CO2  Date Value Ref Range Status  02/24/2023 27 22 - 32 mmol/L Final   Creat  Date Value Ref Range Status  05/01/2022 0.85 0.50 - 1.05 mg/dL Final   Creatinine, Ser  Date Value Ref Range Status  02/24/2023 0.94 0.44 - 1.00 mg/dL Final   Glucose, Bld  Date Value Ref Range Status  02/24/2023 102 (H) 70 - 99 mg/dL Final    Comment:    Glucose reference range applies only to samples taken after fasting for at least 8 hours.   Potassium  Date Value Ref Range Status  02/24/2023 4.8 3.5 - 5.1 mmol/L Final   Sodium  Date Value Ref Range Status  02/24/2023 137 135 - 145 mmol/L Final   Total Bilirubin  Date Value Ref Range Status  05/01/2022 0.3 0.2 - 1.2 mg/dL Final   Protein, ur  Date Value Ref Range Status  12/14/2021 NEGATIVE NEGATIVE mg/dL Final   Protein, UA  Date Value Ref Range Status  06/14/2022 Negative Negative Final   Total Protein  Date Value Ref Range Status  05/01/2022 6.9 6.1 - 8.1 g/dL Final   GFR, Est African American  Date Value Ref Range Status  10/09/2017 79 > OR = 60 mL/min/1.48m2 Final   GFR calc Af Amer  Date Value Ref Range Status  11/20/2018 >60 >60 mL/min Final   GFR  Date Value Ref Range Status  02/20/2017 76.18 >60.00 mL/min Final   eGFR  Date Value Ref Range Status  05/01/2022 75 > OR = 60 mL/min/1.40m2 Final   GFR, Est Non African American  Date Value Ref Range Status  10/09/2017 69 > OR = 60 mL/min/1.20m2 Final   GFR, Estimated  Date Value Ref Range Status   02/24/2023 >60 >60 mL/min Final    Comment:    (NOTE) Calculated using the CKD-EPI Creatinine Equation (2021)          Passed - Completed PHQ-2 or PHQ-9 in the last 360 days      Passed - Last BP in normal range    BP Readings from Last 1 Encounters:  04/08/23 118/78         Passed - Last Heart Rate in normal range    Pulse Readings from Last 1 Encounters:  02/27/23 76         Passed - Valid encounter within last 6 months    Recent Outpatient Visits           1 month ago Acute non intractable tension-type headache   [place] [facility] [place], Netta Neat, DO   5 months ago Encounter for general adult medical examination with abnormal findings   [place] [facility] [place], Salvadore Oxford, NP   6 months ago Irritable bowel syndrome with both constipation and diarrhea   [place] [facility] [place], Salvadore Oxford, NP   7 months ago Acute non-recurrent frontal sinusitis   [place] [facility] [place], Netta Neat, DO   7 months ago Seasonal allergic rhinitis due to pollen   [facility], Salvadore Oxford, NP       Future Appointments             In 3 weeks Sampson Si, Salvadore Oxford, NP [facility], [facility]               Requested Prescriptions  Pending Prescriptions Disp Refills   QUEtiapine (SEROQUEL) 200 MG tablet [Pharmacy Med Name: QUETIAPINE 200MG  TABLETS] 90 tablet 1    Sig: TAKE 1 TABLET(200 MG) BY MOUTH AT BEDTIME     Not Delegated - Psychiatry:  Antipsychotics - Second Generation (Atypical) - quetiapine Failed - 04/11/2023  9:08 AM      Failed - This refill cannot be delegated      Failed - TSH in normal range and within 360 days    TSH  Date Value Ref Range Status  04/23/2021 1.21 0.40 - 4.50 mIU/L Final         Failed - Lipid Panel in normal range within the last 12 months    Cholesterol  Date Value Ref Range Status  05/01/2022  340 (H) <200 mg/dL Final   LDL Cholesterol (Calc)  Date Value Ref Range Status  05/01/2022 245 (H) mg/dL (calc) Final    Comment:    LDL-C levels > or = 190 mg/dL may indicate familial  hypercholesterolemia (FH). Clinical assessment and  measurement of blood lipid levels should be  considered for all first degree relatives of patients with an FH diagnosis. LDL Cholesterol (LDL-C) levels > or = 300 mg/dL may indicate homozygous familial hypercholesterolemia (HoFH). Untreated,  these extremely high LDL-C levels can result in premature CV events and mortality. Patients should be identified early and provided appropriate interventions to reduce the cumulative LDL-C burden from birth. . For questions about testing for familial hypercholesterolemia, please call Engineer, materials at [email or web address]. Wardell Honour, et al. J National Lipid Association  Recommendations for Patient-Centered Management of Dyslipidemia: Part 1 Journal of  Clinical Lipidology 2015;9(2), 129-169. Cuchel, M. et al. (2014). Homozygous familial hypercholesterolaemia: new insights and guidance for clinicians to improve detection and clinical management. European Heart Journal, 35(32), [phone]. Reference range: <100 . Desirable range <100 mg/dL for primary prevention;   <70 mg/dL for patients with CHD or diabetic patients  with > or = 2 CHD risk factors. Marland Kitchen LDL-C is now calculated using the Martin-Hopkins  calculation, which is a validated novel method providing  better accuracy than the Friedewald equation in the  estimation of LDL-C.  Horald Pollen et al. Lenox Ahr. 5409;811(91): 2061-2068  (http://education.[facility].com/faq/FAQ164)    HDL  Date Value Ref Range Status  05/01/2022 45 (L) > OR =  50 mg/dL Final   Triglycerides  Date Value Ref Range Status  05/01/2022 295 (H) <150 mg/dL Final    Comment:    . If a non-fasting specimen was collected, consider repeat triglyceride testing  on a fasting specimen if clinically indicated.  Perry Mount et al. J. of Clin. Lipidol. 2015;9:129-169. .          Failed - CBC within normal limits and completed in the last 12 months    WBC  Date Value Ref Range Status  02/24/2023 7.6 4.0 - 10.5 K/uL Final   RBC  Date Value Ref Range Status  02/24/2023 5.30 (H) 3.87 - 5.11 MIL/uL Final   Hemoglobin  Date Value Ref Range Status  02/24/2023 14.6 12.0 - 15.0 g/dL Final   HCT  Date Value Ref Range Status  02/24/2023 45.3 36.0 - 46.0 % Final   MCHC  Date Value Ref Range Status  02/24/2023 32.2 30.0 - 36.0 g/dL Final   [facility]  Date Value Ref Range Status  02/24/2023 27.5 26.0 - 34.0 pg Final   MCV  Date Value Ref Range Status  02/24/2023 85.5 80.0 - 100.0 fL Final   No results found for: "PLTCOUNTKUC", "LABPLAT", "POCPLA" RDW  Date Value Ref Range Status  02/24/2023 13.3 11.5 - 15.5 % Final         Failed - CMP within normal limits and completed in the last 12 months    Albumin  Date Value Ref Range Status  03/07/2022 4.1 3.5 - 5.0 g/dL Final   Alkaline Phosphatase  Date Value Ref Range Status  03/07/2022 103 38 - 126 U/L Final   Alkaline phosphatase (APISO)  Date Value Ref Range Status  05/01/2022 108 37 - 153 U/L Final   ALT  Date Value Ref Range Status  05/01/2022 30 (H) 6 - 29 U/L Final   AST  Date Value Ref Range Status  05/01/2022 22 10 - 35 U/L Final   BUN  Date Value Ref Range Status  02/24/2023 14 8 - 23 mg/dL Final   Calcium  Date Value Ref Range Status  02/24/2023 9.0 8.9 - 10.3 mg/dL Final   CO2  Date Value Ref Range Status  02/24/2023 27 22 - 32 mmol/L Final   Creat  Date Value Ref Range Status  05/01/2022 0.85 0.50 - 1.05 mg/dL Final   Creatinine, Ser  Date Value Ref Range Status  02/24/2023 0.94 0.44 - 1.00 mg/dL Final   Glucose, Bld  Date Value Ref Range Status  02/24/2023 102 (H) 70 - 99 mg/dL Final    Comment:    Glucose reference range applies only to samples taken  after fasting for at least 8 hours.   Potassium  Date Value Ref Range Status  02/24/2023 4.8 3.5 - 5.1 mmol/L Final   Sodium  Date Value Ref Range Status  02/24/2023 137 135 - 145 mmol/L Final   Total Bilirubin  Date Value Ref Range Status  05/01/2022 0.3 0.2 - 1.2 mg/dL Final   Protein, ur  Date Value Ref Range Status  12/14/2021 NEGATIVE NEGATIVE mg/dL Final   Protein, UA  Date Value Ref Range Status  06/14/2022 Negative Negative Final   Total Protein  Date Value Ref Range Status  05/01/2022 6.9 6.1 - 8.1 g/dL Final   GFR, Est African American  Date Value Ref Range Status  10/09/2017 79 > OR = 60 mL/min/1.34m2 Final   GFR calc Af Amer  Date Value Ref Range Status  11/20/2018 >60 >60 mL/min  Final   GFR  Date Value Ref Range Status  02/20/2017 76.18 >60.00 mL/min Final   eGFR  Date Value Ref Range Status  05/01/2022 75 > OR = 60 mL/min/1.20m2 Final   GFR, Est Non African American  Date Value Ref Range Status  10/09/2017 69 > OR = 60 mL/min/1.42m2 Final   GFR, Estimated  Date Value Ref Range Status  02/24/2023 >60 >60 mL/min Final    Comment:    (NOTE) Calculated using the CKD-EPI Creatinine Equation (2021)          Passed - Completed PHQ-2 or PHQ-9 in the last 360 days      Passed - Last BP in normal range    BP Readings from Last 1 Encounters:  04/08/23 118/78         Passed - Last Heart Rate in normal range    Pulse Readings from Last 1 Encounters:  02/27/23 76         Passed - Valid encounter within last 6 months    Recent Outpatient Visits           1 month ago Acute non intractable tension-type headache   [place] [facility] [place], Netta Neat, DO   5 months ago Encounter for general adult medical examination with abnormal findings   [place] [facility] [place], [place] W, NP   6 months ago Irritable bowel syndrome with both constipation and diarrhea   [place] [facility]  [place], Salvadore Oxford, NP   7 months ago Acute non-recurrent frontal sinusitis   [place] [facility] [place], Netta Neat, DO   7 months ago Seasonal allergic rhinitis due to pollen   [facility] [place], Salvadore Oxford, NP       Future Appointments             In 3 weeks Sampson Si, Salvadore Oxford, NP [facility], [facility]

## 2023-04-18 ENCOUNTER — Ambulatory Visit
Admission: RE | Admit: 2023-04-18 | Discharge: 2023-04-18 | Disposition: A | Discharge status: Home / Self Care | Payer: Medicare HMO | Source: Ambulatory Visit | Attending: Neurosurgery | Admitting: Neurosurgery

## 2023-04-18 ENCOUNTER — Other Ambulatory Visit: Payer: Self-pay | Admitting: Internal Medicine

## 2023-04-18 DIAGNOSIS — G9389 Other specified disorders of brain: Secondary | ICD-10-CM | POA: Diagnosis not present

## 2023-04-18 DIAGNOSIS — G911 Obstructive hydrocephalus: Secondary | ICD-10-CM | POA: Insufficient documentation

## 2023-04-18 DIAGNOSIS — Z982 Presence of cerebrospinal fluid drainage device: Secondary | ICD-10-CM | POA: Diagnosis not present

## 2023-04-18 DIAGNOSIS — G919 Hydrocephalus, unspecified: Secondary | ICD-10-CM | POA: Diagnosis not present

## 2023-04-18 NOTE — Telephone Encounter (Signed)
Requested Prescriptions  Pending Prescriptions Disp Refills   PARoxetine (PAXIL) 40 MG tablet [Pharmacy Med Name: PAROXETINE 40MG  TABLETS] 135 tablet 0    Sig: TAKE 1 AND 1/2 TABLETS(60 MG) BY MOUTH EVERY MORNING     Psychiatry:  Antidepressants - SSRI Passed - 04/18/2023  4:21 PM      Passed - Completed PHQ-2 or PHQ-9 in the last 360 days      Passed - Valid encounter within last 6 months    Recent Outpatient Visits           1 month ago Acute non intractable tension-type headache   [place] [facility] [place], Netta Neat, DO   5 months ago Encounter for general adult medical examination with abnormal findings   [place] [facility] [place], [place] W, NP   6 months ago Irritable bowel syndrome with both constipation and diarrhea   [place] [facility] [place], Salvadore Oxford, NP   7 months ago Acute non-recurrent frontal sinusitis   [place] [facility] [place], Netta Neat, DO   8 months ago Seasonal allergic rhinitis due to pollen   [facility] [place], Salvadore Oxford, NP       Future Appointments             In 2 weeks Sampson Si, Salvadore Oxford, NP [facility], [facility]

## 2023-04-23 DIAGNOSIS — G919 Hydrocephalus, unspecified: Secondary | ICD-10-CM | POA: Diagnosis not present

## 2023-04-23 DIAGNOSIS — M542 Cervicalgia: Secondary | ICD-10-CM | POA: Diagnosis not present

## 2023-04-23 DIAGNOSIS — G479 Sleep disorder, unspecified: Secondary | ICD-10-CM | POA: Diagnosis not present

## 2023-04-23 DIAGNOSIS — Z7689 Persons encountering health services in other specified circumstances: Secondary | ICD-10-CM | POA: Diagnosis not present

## 2023-04-23 DIAGNOSIS — Z982 Presence of cerebrospinal fluid drainage device: Secondary | ICD-10-CM | POA: Diagnosis not present

## 2023-04-23 DIAGNOSIS — R519 Headache, unspecified: Secondary | ICD-10-CM | POA: Diagnosis not present

## 2023-04-28 ENCOUNTER — Telehealth: Payer: Self-pay

## 2023-04-28 NOTE — Telephone Encounter (Signed)
-----   Message from [facility] sent at 04/28/2023 10:13 AM EST ----- Please check on her and see if seh's still having headaches - if so add on for my clinic next week to reprogram shunt ----- Message ----- From: Interface, Rad Results In Sent: 04/28/2023   2:22 AM EST To: Venetia Night, MD

## 2023-04-28 NOTE — Telephone Encounter (Signed)
I spoke with Michele Newton. She reports she is still having headaches, but not real bad. She saw Dr Malvin Johns on 04/23/23 and he put her on nortriptyline, but she hasn't noticed any difference yet. I scheduled her for 05/08/23 at 4pm for a shunt adjustment. However, I advised her to call back to cancel the appointment if her headaches improve between now and then.  *I informed her that we will call her to move her up if something else opens up on 05/06/23 or 05/08/23.

## 2023-05-02 ENCOUNTER — Encounter: Payer: Self-pay | Admitting: Internal Medicine

## 2023-05-02 ENCOUNTER — Ambulatory Visit (INDEPENDENT_AMBULATORY_CARE_PROVIDER_SITE_OTHER): Payer: Medicare HMO | Admitting: Internal Medicine

## 2023-05-02 VITALS — BP 122/74 | Ht 66.0 in | Wt 210.6 lb

## 2023-05-02 DIAGNOSIS — Z6833 Body mass index (BMI) 33.0-33.9, adult: Secondary | ICD-10-CM

## 2023-05-02 DIAGNOSIS — L4050 Arthropathic psoriasis, unspecified: Secondary | ICD-10-CM | POA: Diagnosis not present

## 2023-05-02 DIAGNOSIS — I10 Essential (primary) hypertension: Secondary | ICD-10-CM

## 2023-05-02 DIAGNOSIS — E6609 Other obesity due to excess calories: Secondary | ICD-10-CM

## 2023-05-02 DIAGNOSIS — M5442 Lumbago with sciatica, left side: Secondary | ICD-10-CM

## 2023-05-02 DIAGNOSIS — K219 Gastro-esophageal reflux disease without esophagitis: Secondary | ICD-10-CM

## 2023-05-02 DIAGNOSIS — R519 Headache, unspecified: Secondary | ICD-10-CM

## 2023-05-02 DIAGNOSIS — F5104 Psychophysiologic insomnia: Secondary | ICD-10-CM

## 2023-05-02 DIAGNOSIS — F419 Anxiety disorder, unspecified: Secondary | ICD-10-CM | POA: Diagnosis not present

## 2023-05-02 DIAGNOSIS — R739 Hyperglycemia, unspecified: Secondary | ICD-10-CM | POA: Diagnosis not present

## 2023-05-02 DIAGNOSIS — E782 Mixed hyperlipidemia: Secondary | ICD-10-CM

## 2023-05-02 DIAGNOSIS — E66811 Obesity, class 1: Secondary | ICD-10-CM

## 2023-05-02 DIAGNOSIS — G8929 Other chronic pain: Secondary | ICD-10-CM

## 2023-05-02 DIAGNOSIS — J4520 Mild intermittent asthma, uncomplicated: Secondary | ICD-10-CM | POA: Diagnosis not present

## 2023-05-02 DIAGNOSIS — Z23 Encounter for immunization: Secondary | ICD-10-CM

## 2023-05-02 DIAGNOSIS — D75839 Thrombocytosis, unspecified: Secondary | ICD-10-CM

## 2023-05-02 DIAGNOSIS — K582 Mixed irritable bowel syndrome: Secondary | ICD-10-CM

## 2023-05-02 DIAGNOSIS — F32A Depression, unspecified: Secondary | ICD-10-CM

## 2023-05-02 MED ORDER — BUDESONIDE-FORMOTEROL FUMARATE 160-4.5 MCG/ACT IN AERO: 2.0000 | INHALATION_SPRAY | Freq: Two times a day (BID) | RESPIRATORY_TRACT | 5 refills | Status: DC

## 2023-05-02 NOTE — Assessment & Plan Note (Signed)
Encourage diet and exercise for weight loss 

## 2023-05-02 NOTE — Assessment & Plan Note (Signed)
Continue Symbicort, Singulair and albuterol

## 2023-05-02 NOTE — Assessment & Plan Note (Signed)
Encourage regular stretching and core strengthening Continue tramadol

## 2023-05-02 NOTE — Assessment & Plan Note (Signed)
Try to identify and avoid foods that trigger reflux Encourage weight loss as this can reduce reflux symptoms Continue pantoprazole

## 2023-05-02 NOTE — Assessment & Plan Note (Signed)
Not currently being treated but did discuss Michele Newton however she wants to avoid GI side effects given her history of IBS We will monitor

## 2023-05-02 NOTE — Assessment & Plan Note (Signed)
Encouraged high-fiber diet and adequate water intake Not medicated We will monitor

## 2023-05-02 NOTE — Patient Instructions (Signed)
Brain Shunt Home Guide  A brain shunt is a small plastic tube that is used to drain cerebrospinal fluid (CSF) from your brain into a sac in your abdomen (peritoneum). The peritoneum absorbs this fluid and gets rid of it. You may need a brain shunt if you have too much CSF inside your brain (hydrocephalus). CSF is a type of fluid that cushions your brain and spine. Normally, your brain releases this fluid and then reabsorbs it through drainage channels. If your brain's drainage channels are not working right, this fluid builds up and will need to be redirected with a shunt. Your health care provider will decide how much fluid needs to be drained and will adjust settings on your shunt. Some shunt settings cannot be changed after they have been set (nonprogrammable shunt). Others can be adjusted by your health care provider (programmable shunt). You may feel the tube under your skin behind your ear and where it passes down your neck and your chest before it enters your abdomen. When will I have my shunt removed? Depending on your condition, your shunt may be temporary or permanent. For some people, a brain shunt is a lifelong device. What precautions must I follow? If you have a shunt, you need to take precautions and be aware of signs that may tell you there is a problem with the shunt. After your shunt is placed, take the following precautions: Contact your health care provider if you have a programmable shunt and need to have an MRI. This is very important because many programmable shunts are sensitive to magnets in MRI machines. Before having any surgery, especially abdominal surgery, tell the surgeon about your shunt. You may need to take antibiotic medicines before having a procedure. Do not wear tight-fitting hats or headgear. Return to your normal activities as told by your health care provider. Ask your health care provider what activities are safe for you. What are the warning signs of a shunt  malfunction? A brain shunt can stop working or become clogged. If the shunt is not working properly, it will not drain the CSF. This can cause an increase in brain pressure. You must know the warning signs of a shunt malfunction because they can start suddenly. These include: A headache that gets worse over time. Vomiting without cause. Feeling sleepier than usual. Loss of appetite. Low energy. Irritability. Severe symptoms include: Personality change or confusion. Vision changes, such as blurry vision, double vision, or loss of vision. Swelling of the skin that runs along the path of the shunt. A return of your original symptoms. Trouble walking. Inability to control your bladder (urinary incontinence). Having a seizure. What are the warning signs of a shunt infection? If bacteria gets into the tissue around the shunt, you can develop an infection. This can cause your shunt to stop working properly. Watch for signs of infection, such as: Fever. Redness or swelling of the skin along the shunt path. Pain around the shunt or shunt tubing. A headache or a stiff neck. Nausea or vomiting. Questions to ask your health care provider: What is my surgeon's contact information? What is the name and type of my brain shunt? What activities are safe for me? Where to find more information The Hydrocephalus Association: www.hydroassoc.org The Spina Bifida Association: www.spinabifidaassociation.org The [employer] of Neurological Disorders and Stroke: [employer] Contact a health care provider if: You are sleepier than usual or have trouble waking up. You become irritable or start to behave abnormally. You have a fever.  Get help right away if: You notice redness or swelling along the shunt path. You vomit for no reason. You have a headache that is getting worse. You start to twitch or shake (seizure). You have vision problems. You lose coordination or balance. These symptoms  may represent a serious problem that is an emergency. Do not wait to see if the symptoms will go away. Get medical help right away. Call your local emergency services (911 in the [place] Do not drive yourself to the hospital. Summary A brain shunt is a small plastic tube used to drain cerebrospinal fluid (CSF) from your brain into a sac in your abdomen (peritoneum). You may need a brain shunt if you have too much CSF inside your brain (hydrocephalus). Your shunt may be temporary or permanent, depending on your condition. A brain shunt can malfunction or become clogged. If the shunt is not working right, it will not drain the CSF. The shunt can also get infected. Warning signs of shunt malfunction include headache, vomiting, drowsiness, loss of appetite, low energy, irritability, vision changes, urinary incontinence, and seizures. Warning signs of a shunt infection include fever, redness or swelling of skin along the shunt path, pain around the shunt, headache, stiff neck, nausea, or vomiting. This information is not intended to replace advice given to you by your health care provider. Make sure you discuss any questions you have with your health care provider. Document Revised: 03/20/2020 Document Reviewed: 03/20/2020 Elsevier Patient Education  2024 [employer].

## 2023-05-02 NOTE — Assessment & Plan Note (Signed)
Continue trazodone 

## 2023-05-02 NOTE — Assessment & Plan Note (Signed)
CBC reviewed 

## 2023-05-02 NOTE — Assessment & Plan Note (Signed)
Status post recent shunt revision Recently started on nortriptyline She will follow-up with neurology/neurosurgery as planned

## 2023-05-02 NOTE — Assessment & Plan Note (Signed)
Controlled on verapamil Reinforced DASH diet and exercise for weight loss

## 2023-05-02 NOTE — Assessment & Plan Note (Signed)
C-Met and lipid profile today Encouraged her to consume a low-fat diet Discussed Repatha injections as she is hesitant to take statins due to previous adverse effects

## 2023-05-02 NOTE — Assessment & Plan Note (Signed)
Continue paroxetine and Seroquel Support offered

## 2023-05-02 NOTE — Progress Notes (Signed)
Subjective:    Patient ID: Michele Newton, female    DOB: [birth year], 67 y.o.   MRN: [id number]  HPI  Patient presents to clinic today for 39-month follow-up of chronic conditions.  HTN: Her BP today is 122/74.  She is taking verapamil as prescribed.  ECG from 02/2023 reviewed.  GERD: She is not sure what triggers this.  She denies breakthrough on pantoprazole.  There is no upper GI on file.  HLD: Her last LDL was 245, triglycerides 295, 04/2022.  She is not taking any cholesterol-lowering medication at this time but has been on atorvastatin in the past.  She does not consume a low-fat diet.  Psoriatic arthritis: Mainly in her elbows and knees.  She is not taking any medication for this.  She does not follow with rheumatology but has in the past.  OA: Mainly in her back and hips.  She takes tramadol as needed with good relief of symptoms. She does not follow with orthopedics but has seen them in the past.  Anxiety and depression: Chronic, managed on paroxetine and seroquel.  She is not currently seeing a therapist or psychiatrist.  She denies SI/HI.  Insomnia: She has difficulty falling asleep.  She takes trazodone and seroquel as prescribed with good relief of symptoms.  There is no sleep study on file.  Asthma: She reports chronic cough and shortness of breath.  She is not taking symbicort because she ran out but is taking, singulair and albuterol as prescribed.  There are no PFTs on file.  She does not follow with pulmonology.  IBS: Alternating constipation and diarrhea.  She is not taking any medications for this.  There is no colonoscopy on file.  Thrombocytosis: Her last platelet count was 14, 02/2023.  She does not follow with hematology.  Frequent headaches: She had a shunt replacement in October.  She reports she was recently started on nortriptyline. She follows with neurology.  Review of Systems     Past Medical History:  Diagnosis Date   Anxiety    Arthritis    Asthma     Depression    Frequent headaches    GERD (gastroesophageal reflux disease)    Hydrocephalus (HCC) 1995   Hypertension    Migraine    PONV (postoperative nausea and vomiting)    Psoriatic arthritis (HCC)     Current Outpatient Medications  Medication Sig Dispense Refill   budesonide-formoterol (SYMBICORT) 160-4.5 MCG/ACT inhaler Inhale 2 puffs into the lungs 2 (two) times daily. 1 each 3   docusate sodium (COLACE) 100 MG capsule Take 1 capsule (100 mg total) by mouth 2 (two) times daily. 10 capsule 0   montelukast (SINGULAIR) 10 MG tablet TAKE 1 TABLET(10 MG) BY MOUTH AT BEDTIME 90 tablet 3   Nebulizers (COMPRESSOR/NEBULIZER) MISC 1 Units by Does not apply route every 4 (four) hours as needed. 1 each 0   pantoprazole (PROTONIX) 40 MG tablet TAKE 1 TABLET(40 MG) BY MOUTH TWICE DAILY 180 tablet 3   PARoxetine (PAXIL) 40 MG tablet TAKE 1 AND 1/2 TABLETS(60 MG) BY MOUTH EVERY MORNING 135 tablet 0   QUEtiapine (SEROQUEL) 200 MG tablet TAKE 1 TABLET(200 MG) BY MOUTH AT BEDTIME 90 tablet 1   rizatriptan (MAXALT-MLT) 10 MG disintegrating tablet Take 1 tab (dissolve in mouth) at first sign of migraine headache. May repeat in 2 hours if needed for max dose in 24 hours. (Patient not taking: Reported on 04/08/2023) 10 tablet 0   traZODone (DESYREL) 50 MG tablet TAKE  1/2 TO 1 TABLET(25 TO 50 MG) BY MOUTH AT BEDTIME AS NEEDED FOR SLEEP 90 tablet 1   verapamil (CALAN-SR) 180 MG CR tablet Take 1 tablet (180 mg total) by mouth daily. 90 tablet 1   No current facility-administered medications for this visit.    Allergies  Allergen Reactions   Amitriptyline Other (See Comments)    hallucinations   Effexor [Venlafaxine] Other (See Comments)    Amnesia, hospitalized x 2 weeks   Haldol [Haloperidol] Other (See Comments)    Paralyzed vocal cords     Family History  Problem Relation Age of Onset   Arthritis Mother    Hyperlipidemia Mother    Heart disease Mother 56       Died of heart failure    Hypertension Mother    Diabetes Mother    Depression Mother    Arthritis Father    Cancer Father        prostate cancer   Diabetes Brother    Breast cancer Maternal Grandmother    Colon cancer Neg Hx     Social History   Socioeconomic History   Marital status: Married    Spouse name: Jillyn Hidden   Number of children: 2   Years of education: 14   Highest education level: Associate degree: occupational, Scientist, product/process development, or vocational program  Occupational History    Employer: OTHER  Tobacco Use   Smoking status: Never   Smokeless tobacco: Never  Vaping Use   Vaping status: Never Used  Substance and Sexual Activity   Alcohol use: No   Drug use: No   Sexual activity: Not Currently    Birth control/protection: Surgical  Other Topics Concern   Not on file  Social History Narrative   Michele Newton lives at home with her husband, Jillyn Hidden, of 5 months and a cat named, Puff. She has 2 sons from a previous marriage. She has 4 grandchildren.    Social Drivers of Corporate investment banker Strain: Low Risk  (05/01/2023)   Overall Financial Resource Strain (CARDIA)    Difficulty of Paying Living Expenses: Not hard at all  Food Insecurity: No Food Insecurity (05/01/2023)   Hunger Vital Sign    Worried About Running Out of Food in the Last Year: Never true    Ran Out of Food in the Last Year: Never true  Transportation Needs: No Transportation Needs (05/01/2023)   PRAPARE - Administrator, Civil Service (Medical): No    Lack of Transportation (Non-Medical): No  Physical Activity: Inactive (05/01/2023)   Exercise Vital Sign    Days of Exercise per Week: 0 days    Minutes of Exercise per Session: 0 min  Stress: Stress Concern Present (05/01/2023)   [employer] of Occupational Health - Occupational Stress Questionnaire    Feeling of Stress : To some extent  Social Connections: Socially Integrated (05/01/2023)   Social Connection and Isolation Panel [NHANES]    Frequency of  Communication with Friends and Family: More than three times a week    Frequency of Social Gatherings with Friends and Family: Never    Attends Religious Services: 1 to 4 times per year    Active Member of [employer] or Organizations: Yes    Attends Banker Meetings: More than 4 times per year    Marital Status: Married  Catering manager Violence: Not At Risk (02/26/2023)   Humiliation, Afraid, Rape, and Kick questionnaire    Fear of Current or Ex-Partner: No  Emotionally Abused: No    Physically Abused: No    Sexually Abused: No     Constitutional: Pt reports intermittent headaches. Denies fever, malaise, fatigue, or abrupt weight changes.  HEENT: Denies eye pain, eye redness, ear pain, ringing in the ears, wax buildup, runny nose, nasal congestion, bloody nose, or sore throat. Respiratory: Patient reports chronic cough and shortness of breath.  Denies difficulty breathing, or sputum production.   Cardiovascular: Denies chest pain, chest tightness, palpitations or swelling in the hands or feet.  Gastrointestinal: Pt reports mainly constipation. Denies abdominal pain, bloating, diarrhea or blood in the stool.  GU: Denies urgency, frequency, pain with urination, burning sensation, blood in urine, odor or discharge. Musculoskeletal: Patient reports joint pain.  Denies decrease in range of motion, difficulty with gait, muscle pain or joint swelling.  Skin: Denies redness, rashes lesions or ulcercations.  Neurological: Patient reports insomnia.  Denies dizziness, difficulty with memory, difficulty with speech or problems with balance and coordination.  Psych: Patient has a history of anxiety and depression.  Denies SI/HI.  No other specific complaints in a complete review of systems (except as listed in HPI above).  Objective:   Physical Exam BP 122/74 (BP Location: Left Arm, Patient Position: Sitting, Cuff Size: Large)   Ht 5\' 6"  (1.676 m)   Wt 210 lb 9.6 oz (95.5 kg)   BMI  33.99 kg/m    Wt Readings from Last 3 Encounters:  04/08/23 215 lb (97.5 kg)  02/26/23 215 lb 11.2 oz (97.8 kg)  02/24/23 215 lb 11.2 oz (97.8 kg)    General: Appears her stated age, obese, in NAD. Skin: Warm, dry and intact.  HEENT: Head: normal shape and size; Eyes: sclera white, no icterus, conjunctiva pink, PERRLA and EOMs intact;  Cardiovascular: Normal rate and rhythm. S1,S2 noted.  No murmur, rubs or gallops noted. No JVD or BLE edema. No carotid bruits noted. Pulmonary/Chest: Normal effort and positive vesicular breath sounds. No respiratory distress. No wheezes, rales or ronchi noted.  Abdomen: Normal bowel sounds.  Musculoskeletal:  No difficulty with gait.  Neurological: Alert and oriented. Cranial nerves II-XII grossly intact. Coordination normal.  Psychiatric: Mood and affect normal. Behavior is normal. Judgment and thought content normal.     BMET    Component Value Date/Time   NA 137 02/24/2023 1219   K 4.8 02/24/2023 1219   CL 103 02/24/2023 1219   CO2 27 02/24/2023 1219   GLUCOSE 102 (H) 02/24/2023 1219   BUN 14 02/24/2023 1219   CREATININE 0.94 02/24/2023 1219   CREATININE 0.85 05/01/2022 1046   CALCIUM 9.0 02/24/2023 1219   GFRNONAA >60 02/24/2023 1219   GFRNONAA 69 10/09/2017 1024   GFRAA >60 11/20/2018 0408   GFRAA 79 10/09/2017 1024    Lipid Panel     Component Value Date/Time   CHOL 340 (H) 05/01/2022 1046   TRIG 295 (H) 05/01/2022 1046   HDL 45 (L) 05/01/2022 1046   CHOLHDL 7.6 (H) 05/01/2022 1046   VLDL 33.0 02/20/2017 0841   LDLCALC 245 (H) 05/01/2022 1046    CBC    Component Value Date/Time   WBC 7.6 02/24/2023 1219   RBC 5.30 (H) 02/24/2023 1219   HGB 14.6 02/24/2023 1219   HCT 45.3 02/24/2023 1219   PLT 410 (H) 02/24/2023 1219   MCV 85.5 02/24/2023 1219   MCH 27.5 02/24/2023 1219   MCHC 32.2 02/24/2023 1219   RDW 13.3 02/24/2023 1219   LYMPHSABS 2.2 07/18/2020 1054   MONOABS  0.8 07/18/2020 1054   EOSABS 0.1 07/18/2020 1054    BASOSABS 0.1 07/18/2020 1054    Hgb A1C Lab Results  Component Value Date   HGBA1C 5.5 10/29/2021           Assessment & Plan:    RTC in 6 months for your annual exam Nicki Reaper, NP

## 2023-05-03 LAB — HEMOGLOBIN A1C
Hgb A1c MFr Bld: 5.9 %{Hb} — ABNORMAL HIGH (ref <5.7)
Mean Plasma Glucose: 123 mg/dL
eAG (mmol/L): 6.8 mmol/L

## 2023-05-03 LAB — LIPID PANEL
Cholesterol: 307 mg/dL — ABNORMAL HIGH (ref <200)
HDL: 46 mg/dL — ABNORMAL LOW (ref >=50)
LDL Cholesterol (Calc): 216 mg/dL — ABNORMAL HIGH
Non-HDL Cholesterol (Calc): 261 mg/dL — ABNORMAL HIGH (ref <130)
Total CHOL/HDL Ratio: 6.7 (calc) — ABNORMAL HIGH (ref <5.0)
Triglycerides: 242 mg/dL — ABNORMAL HIGH (ref <150)

## 2023-05-05 ENCOUNTER — Encounter: Payer: Self-pay | Admitting: Internal Medicine

## 2023-05-05 ENCOUNTER — Telehealth: Payer: Self-pay

## 2023-05-05 MED ORDER — REPATHA SURECLICK 140 MG/ML ~~LOC~~ SOAJ
140.0000 mg | SUBCUTANEOUS | 1 refills | Status: DC
Start: 2023-05-05 — End: 2023-12-29

## 2023-05-05 NOTE — Telephone Encounter (Signed)
Michele Newton (Key: [id number]) PA Case ID #: [id number] Rx #: [id number] Need Help? Call us at [phone] Status sent iconSent to Plan today Drug Repatha SureClick 140MG /ML auto-injectors ePA cloud Engineer, manufacturing systems Electronic PA Form Original Claim Info [id number] PA REQD CALL [phone]

## 2023-05-06 NOTE — Telephone Encounter (Signed)
 Michele Newton (Key: [id number]) PA Case ID #: [id number] Rx #: [id number] Need Help? Call us  at [phone] Outcome Approved on December 30 by [facility] NCPDP 2017 PA Case: [id number], Status: Approved, Coverage Starts on: 05/06/2022 12:00:00 AM, Coverage Ends on: 05/05/2024 12:00:00 AM. Questions? Contact [phone]. Authorization Expiration Date: 05/04/2024 Drug Repatha  SureClick 140MG /ML auto-injectors ePA cloud Engineer, Manufacturing Systems Electronic PA Form Original Claim Info [id number] PA REQD CALL [phone]

## 2023-05-08 ENCOUNTER — Ambulatory Visit: Payer: Medicare HMO | Admitting: Neurosurgery

## 2023-05-08 VITALS — BP 130/74 | Ht 66.0 in | Wt 210.0 lb

## 2023-05-08 DIAGNOSIS — Z09 Encounter for follow-up examination after completed treatment for conditions other than malignant neoplasm: Secondary | ICD-10-CM

## 2023-05-08 DIAGNOSIS — G911 Obstructive hydrocephalus: Secondary | ICD-10-CM

## 2023-05-08 NOTE — Progress Notes (Signed)
   REFERRING PHYSICIAN:  Antonette Angeline LELON Carrolyn [address] [place],  [place] [address]  DOS: 02/26/23 VP shunt revision  HISTORY OF PRESENT ILLNESS: Michele Newton is status post VP shunt revision.  She has not had a headache in a week.  She is doing much better.    PHYSICAL EXAMINATION:  NEUROLOGICAL:  General: In no acute distress.   Awake, alert, oriented to person, place, and time.  Pupils equal round and reactive to light.  Facial tone is symmetric.  Tongue protrusion is midline.  There is no pronator drift.  Strength: Side Biceps Triceps Deltoid Interossei Grip Wrist Ext. Wrist Flex.  R 5 5 5 5 5 5 5   L 5 5 5 5 5 5 5    Incisions c/d/I and healing well   Imaging:  CT scan shows interval decrease in the size of her ventricular system.  Assessment / Plan: Michele Newton is doing better after VP shunt revision.  I would like to keep her shunt at his current setting for now.  I will see her back in 6 weeks.     Reeves Daisy MD Dept of Neurosurgery

## 2023-05-08 NOTE — Telephone Encounter (Signed)
 Appt moved to 2pm on 05/08/2023.

## 2023-05-20 ENCOUNTER — Encounter: Payer: Medicare HMO | Admitting: Neurosurgery

## 2023-06-05 DIAGNOSIS — M542 Cervicalgia: Secondary | ICD-10-CM | POA: Diagnosis not present

## 2023-06-05 DIAGNOSIS — R519 Headache, unspecified: Secondary | ICD-10-CM | POA: Diagnosis not present

## 2023-06-05 DIAGNOSIS — G919 Hydrocephalus, unspecified: Secondary | ICD-10-CM | POA: Diagnosis not present

## 2023-06-05 DIAGNOSIS — G479 Sleep disorder, unspecified: Secondary | ICD-10-CM | POA: Diagnosis not present

## 2023-06-05 DIAGNOSIS — Z982 Presence of cerebrospinal fluid drainage device: Secondary | ICD-10-CM | POA: Diagnosis not present

## 2023-06-06 ENCOUNTER — Encounter: Payer: Self-pay | Admitting: Internal Medicine

## 2023-06-06 ENCOUNTER — Ambulatory Visit (INDEPENDENT_AMBULATORY_CARE_PROVIDER_SITE_OTHER): Payer: Medicare HMO | Admitting: Internal Medicine

## 2023-06-06 VITALS — BP 124/74 | HR 78 | Temp 98.4°F | Ht 66.0 in | Wt 212.0 lb

## 2023-06-06 DIAGNOSIS — B9789 Other viral agents as the cause of diseases classified elsewhere: Secondary | ICD-10-CM | POA: Diagnosis not present

## 2023-06-06 DIAGNOSIS — J329 Chronic sinusitis, unspecified: Secondary | ICD-10-CM

## 2023-06-06 MED ORDER — AMOXICILLIN-POT CLAVULANATE 875-125 MG PO TABS
1.0000 | ORAL_TABLET | Freq: Two times a day (BID) | ORAL | 0 refills | Status: DC
Start: 2023-06-06 — End: 2023-08-25

## 2023-06-06 NOTE — Progress Notes (Signed)
Subjective:    Patient ID: Michele Newton, female    DOB: 1956-03-18, 68 y.o.   MRN: [id number]  HPI  Discussed the use of AI scribe software for clinical note transcription with the patient, who gave verbal consent to proceed.   The patient, with hearing impairment, presents with sinus congestion and hearing difficulties.  She has been experiencing sinus congestion and hearing difficulties for about a week. Her head feels 'stopped up', and her ears feel full, which is concerning due to her existing hearing impairment. She has a runny nose with clear discharge and nasal congestion that worsens when lying down due to sinus drainage.  She has a sore throat with difficulty swallowing, described as similar to 'strep throat type stuff'. The sore throat was severe enough to disrupt sleep the previous night, despite using honey and cough drops for relief. She has a mild cough attributed to drainage but no colored sputum. No nausea, vomiting, diarrhea, fever, chills, or body aches are present.  She has a history of hearing impairment and uses hearing aids. She reports increased difficulty hearing due to ear fullness, which may be related to the sinusitis.  She has taken Tylenol Sinus and Nyquil for symptom relief. She typically does not experience allergies at this time of year, noting that her allergies usually occur in the spring.    Review of Systems   Past Medical History:  Diagnosis Date   Anxiety    Arthritis    Asthma    Depression    Frequent headaches    GERD (gastroesophageal reflux disease)    Hydrocephalus (HCC) 1995   Hypertension    Migraine    PONV (postoperative nausea and vomiting)    Psoriatic arthritis (HCC)     Current Outpatient Medications  Medication Sig Dispense Refill   budesonide-formoterol (SYMBICORT) 160-4.5 MCG/ACT inhaler Inhale 2 puffs into the lungs 2 (two) times daily. 1 each 5   docusate sodium (COLACE) 100 MG capsule Take 1 capsule (100 mg total) by  mouth 2 (two) times daily. 10 capsule 0   Evolocumab (REPATHA SURECLICK) 140 MG/ML SOAJ Inject 140 mg into the skin every 14 (fourteen) days. 6 mL 1   montelukast (SINGULAIR) 10 MG tablet TAKE 1 TABLET(10 MG) BY MOUTH AT BEDTIME 90 tablet 3   Nebulizers (COMPRESSOR/NEBULIZER) MISC 1 Units by Does not apply route every 4 (four) hours as needed. 1 each 0   nortriptyline (PAMELOR) 10 MG capsule Take 10 mg by mouth.     pantoprazole (PROTONIX) 40 MG tablet TAKE 1 TABLET(40 MG) BY MOUTH TWICE DAILY 180 tablet 3   PARoxetine (PAXIL) 40 MG tablet TAKE 1 AND 1/2 TABLETS(60 MG) BY MOUTH EVERY MORNING 135 tablet 0   QUEtiapine (SEROQUEL) 200 MG tablet TAKE 1 TABLET(200 MG) BY MOUTH AT BEDTIME 90 tablet 1   traZODone (DESYREL) 50 MG tablet TAKE 1/2 TO 1 TABLET(25 TO 50 MG) BY MOUTH AT BEDTIME AS NEEDED FOR SLEEP 90 tablet 1   verapamil (CALAN-SR) 180 MG CR tablet Take 1 tablet (180 mg total) by mouth daily. 90 tablet 1   No current facility-administered medications for this visit.    Allergies  Allergen Reactions   Amitriptyline Other (See Comments)    hallucinations   Effexor [Venlafaxine] Other (See Comments)    Amnesia, hospitalized x 2 weeks   Haldol [Haloperidol] Other (See Comments)    Paralyzed vocal cords     Family History  Problem Relation Age of Onset   Arthritis Mother  Hyperlipidemia Mother    Heart disease Mother 89       Died of heart failure   Hypertension Mother    Diabetes Mother    Depression Mother    Arthritis Father    Cancer Father        prostate cancer   Diabetes Brother    Breast cancer Maternal Grandmother    Colon cancer Neg Hx     Social History   Socioeconomic History   Marital status: Married    Spouse name: Jillyn Hidden   Number of children: 2   Years of education: 14   Highest education level: Associate degree: occupational, Scientist, product/process development, or vocational program  Occupational History    Employer: OTHER  Tobacco Use   Smoking status: Never   Smokeless  tobacco: Never  Vaping Use   Vaping status: Never Used  Substance and Sexual Activity   Alcohol use: No   Drug use: No   Sexual activity: Not Currently    Birth control/protection: Surgical  Other Topics Concern   Not on file  Social History Narrative   Michele Newton lives at home with her husband, Jillyn Hidden, of 5 months and a cat named, Puff. She has 2 sons from a previous marriage. She has 4 grandchildren.    Social Drivers of Corporate investment banker Strain: Low Risk  (05/01/2023)   Overall Financial Resource Strain (CARDIA)    Difficulty of Paying Living Expenses: Not hard at all  Food Insecurity: No Food Insecurity (05/01/2023)   Hunger Vital Sign    Worried About Running Out of Food in the Last Year: Never true    Ran Out of Food in the Last Year: Never true  Transportation Needs: No Transportation Needs (05/01/2023)   PRAPARE - Administrator, Civil Service (Medical): No    Lack of Transportation (Non-Medical): No  Physical Activity: Inactive (05/01/2023)   Exercise Vital Sign    Days of Exercise per Week: 0 days    Minutes of Exercise per Session: 0 min  Stress: Stress Concern Present (05/01/2023)   [employer] of Occupational Health - Occupational Stress Questionnaire    Feeling of Stress : To some extent  Social Connections: Socially Integrated (05/01/2023)   Social Connection and Isolation Panel [NHANES]    Frequency of Communication with Friends and Family: More than three times a week    Frequency of Social Gatherings with Friends and Family: Never    Attends Religious Services: 1 to 4 times per year    Active Member of [employer] or Organizations: Yes    Attends Engineer, structural: More than 4 times per year    Marital Status: Married  Catering manager Violence: Not At Risk (02/26/2023)   Humiliation, Afraid, Rape, and Kick questionnaire    Fear of Current or Ex-Partner: No    Emotionally Abused: No    Physically Abused: No    Sexually Abused:  No     Constitutional: Pt reports headache. Denies fever, malaise, fatigue, or abrupt weight changes.  HEENT: Pt reports sinus pressure, ear fullness, nasal congestion, runny nose and sore throat.Denies eye pain, eye redness, ringing in the ears, wax buildup, bloody nose, . Respiratory: Pt reports cough. Denies difficulty breathing, shortness of breath.   Cardiovascular: Denies chest pain, chest tightness, palpitations or swelling in the hands or feet.  Gastrointestinal: Denies abdominal pain, bloating, constipation, diarrhea or blood in the stool.  Musculoskeletal: Denies decrease in range of motion, difficulty with gait, muscle pain  or joint pain and swelling.  Neurological: Denies dizziness, difficulty with memory, difficulty with speech or problems with balance and coordination.    No other specific complaints in a complete review of systems (except as listed in HPI above).      Objective:   Physical Exam  BP 124/74 (BP Location: Left Arm, Patient Position: Sitting, Cuff Size: Normal)   Pulse 78   Temp 98.4 F (36.9 C)   Ht 5\' 6"  (1.676 m)   Wt 212 lb (96.2 kg)   SpO2 97%   BMI 34.22 kg/m   Wt Readings from Last 3 Encounters:  05/08/23 210 lb (95.3 kg)  05/02/23 210 lb 9.6 oz (95.5 kg)  04/08/23 215 lb (97.5 kg)    General: Appears her stated age, obese, in NAD. Skin: Warm, dry and intact. No rashes noted. HEENT: Head: normal shape and size maxillary and frontal sinus tenderness noted; Eyes: sclera white, no icterus, conjunctiva pink, PERRLA and EOMs intact; Ears: Tm's gray and intact, distorted light reflex due to holes in bilateral TM; Nose: mucosa pink and moist, septum midline; Throat/Mouth: Teeth present, mucosa erythematous and moist, + PND, no exudate, lesions or ulcerations noted.  Neck: No adenopathy noted. Cardiovascular: Normal rate and rhythm.  Pulmonary/Chest: Normal effort and positive vesicular breath sounds. No respiratory distress. No wheezes, rales or  ronchi noted.  Neurological: Alert and oriented.   BMET    Component Value Date/Time   NA 137 02/24/2023 1219   K 4.8 02/24/2023 1219   CL 103 02/24/2023 1219   CO2 27 02/24/2023 1219   GLUCOSE 102 (H) 02/24/2023 1219   BUN 14 02/24/2023 1219   CREATININE 0.94 02/24/2023 1219   CREATININE 0.85 05/01/2022 1046   CALCIUM 9.0 02/24/2023 1219   GFRNONAA >60 02/24/2023 1219   GFRNONAA 69 10/09/2017 1024   GFRAA >60 11/20/2018 0408   GFRAA 79 10/09/2017 1024    Lipid Panel     Component Value Date/Time   CHOL 307 (H) 05/02/2023 0936   TRIG 242 (H) 05/02/2023 0936   HDL 46 (L) 05/02/2023 0936   CHOLHDL 6.7 (H) 05/02/2023 0936   VLDL 33.0 02/20/2017 0841   LDLCALC 216 (H) 05/02/2023 0936    CBC    Component Value Date/Time   WBC 7.6 02/24/2023 1219   RBC 5.30 (H) 02/24/2023 1219   HGB 14.6 02/24/2023 1219   HCT 45.3 02/24/2023 1219   PLT 410 (H) 02/24/2023 1219   MCV 85.5 02/24/2023 1219   MCH 27.5 02/24/2023 1219   MCHC 32.2 02/24/2023 1219   RDW 13.3 02/24/2023 1219   LYMPHSABS 2.2 07/18/2020 1054   MONOABS 0.8 07/18/2020 1054   EOSABS 0.1 07/18/2020 1054   BASOSABS 0.1 07/18/2020 1054    Hgb A1C Lab Results  Component Value Date   HGBA1C 5.9 (H) 05/02/2023            Assessment & Plan:  Assessment and Plan     Viral Sinusitis Symptoms of sinus congestion, headache, sore throat, and cough for one week. Clear nasal discharge and postnasal drip. No fever or colored sputum. Likely viral sinusitis or allergy-related. -Prescribe Augmentin 875-125mg  twice daily for 10 days.  I really felt like she needed prednisone taper for 6 days instead of antibiotics given lack of fever and clear discharge however she wants to avoid steroids if possible -Recommend over-the-counter antihistamine (Zyrtec) and Flonase. -Follow-up if symptoms do not improve in one week.    RTC in 5 months for your annual  exam Nicki Reaper, NP

## 2023-06-06 NOTE — Patient Instructions (Signed)
Sinus Pain  Sinus pain happens when your sinuses get swollen or blocked (clogged). Sinuses are spaces behind the bones of your face and forehead. You may feel pain or pressure in your face, forehead, ears, or upper teeth. Sinus pain can be mild or very bad. What are the causes? Sinus pain can result from conditions that affect your sinuses. Common causes include: Colds. Sinus infections. Allergies. What are the signs or symptoms? The main symptom of this condition is pain or pressure in your face, forehead, ears, or upper teeth. People who have sinus pain often have other symptoms, such as: Stuffed up or runny nose. Fever. Not being able to smell. Headache. Weather changes can make your symptoms worse. How is this treated? Treatment for this condition depends on the cause. Sinus pain caused by: A sinus infection may be treated with antibiotic medicine. A stuffy nose may be helped by rinsing out the nose and sinuses with a salt water solution. Allergies may be helped by allergy medicines and nasal sprays. Surgery may be needed in some cases if other treatments do not help. Follow these instructions at home: General instructions If told: Apply a warm, moist washcloth to your face. This can help to lessen pain. Use a nasal salt water wash. Follow the directions on the bottle or box. Hydrate and humidify Drink enough water to keep your pee (urine) pale yellow. Use a humidifier if your home is dry. Breathe in steam for 10-15 minutes, 3-4 times a day or as told by your doctor. You can do this in the bathroom while a hot shower is running. Try not to spend time in cool or dry air. Medicines  Take over-the-counter and prescription medicines only as told by your doctor. If you were prescribed an antibiotic medicine, take it as told by your doctor. Do not stop taking it even if you start to feel better. Use a nose spray if your nose feels full of mucus (congested). Contact a doctor if: You  get more than one headache a week. Light or sound bothers you. You have a fever. You feel sick to your stomach (nauseous) or you vomit. Your headaches do not get better with treatment. Get help right away if: You have trouble seeing. You suddenly have very bad pain in your face or head. You start to have quick, sudden movements or shaking that you cannot control (seizure). You are confused. You have a stiff neck. Summary Sinus pain happens when your sinuses get swollen or blocked (clogged). Sinuses are spaces behind the bones of your face and forehead. You may feel pain or pressure in your face, forehead, ears, or upper teeth. Take over-the-counter and prescription medicines only as told by your doctor. If told, apply a warm, moist washcloth to your face. This can help to lessen pain. This information is not intended to replace advice given to you by your health care provider. Make sure you discuss any questions you have with your health care provider. Document Revised: 03/25/2021 Document Reviewed: 03/25/2021 Elsevier Patient Education  2024 [employer].

## 2023-06-09 ENCOUNTER — Ambulatory Visit: Payer: Self-pay

## 2023-06-09 NOTE — Telephone Encounter (Signed)
Summary: rx concern   The patient has called as directed by their PCP to share that they are continuing to experience ear, throat and sinus discomfort  The patient has previously been prescribed antibiotics which have been mostly ineffective  The patient would ike to speak with a member of staff further when possible  Please contact when available     Called pt - left message on machine to return our call.

## 2023-06-09 NOTE — Telephone Encounter (Signed)
Chief Complaint: Left ear pain Symptoms: 6-7/10, yellow drainage, sinus fullness/pressure, ear pressure Frequency: Ongoing x 1 week Pertinent Negatives: Patient denies other symptoms Disposition: [] ED /[] Urgent Care (no appt availability in office) / [x] Appointment(In office/virtual)/ []  [facility]/ [] Home Care/ [] Refused Recommended Disposition /[] [facility]/ []  Follow-up with PCP Additional Notes: No availability with PCP, scheduled for tomorrow with Dr. Althea Charon.

## 2023-06-09 NOTE — Telephone Encounter (Signed)
Summary: rx concern   The patient has called as directed by their PCP to share that they are continuing to experience ear, throat and sinus discomfort  The patient has previously been prescribed antibiotics which have been mostly ineffective  The patient would ike to speak with a member of staff further when possible  Please contact when available     Called pt and left message on machine. After 3 attempts unable to reach pt. I will forward to clinic per protocol.

## 2023-06-09 NOTE — Telephone Encounter (Signed)
Reason for Disposition . Ear pain  Answer Assessment - Initial Assessment Questions 1. LOCATION: "Which ear is involved?"      Left ear 2. COLOR: "What is the color of the discharge?"      Yellow 3. CONSISTENCY: "How runny is the discharge? Could it be water?"      Runny like water 4. ONSET: "When did you first notice the discharge?"     Draining all along for about a week, gotten bad over the weekend 5. PAIN: "Is there any earache?" "How bad is it?"  (Scale 1-10; or mild, moderate, severe)     Yes, 6-7/10 6. OBJECTS: "Have you put anything in your ear?" (e.g., Q-tip, other object)      No 7. OTHER SYMPTOMS: "Do you have any other symptoms?" (e.g., headache, fever, dizziness, vomiting, runny nose)     Hoarse, ear pressure, sinus pressure in head  Protocols used: Ear - Discharge-A-AH

## 2023-06-10 ENCOUNTER — Ambulatory Visit: Payer: Medicare HMO | Admitting: Family Medicine

## 2023-06-10 ENCOUNTER — Encounter: Payer: Self-pay | Admitting: Family Medicine

## 2023-06-10 VITALS — BP 130/80 | HR 86 | Ht 66.0 in | Wt 211.0 lb

## 2023-06-10 DIAGNOSIS — H7292 Unspecified perforation of tympanic membrane, left ear: Secondary | ICD-10-CM | POA: Diagnosis not present

## 2023-06-10 DIAGNOSIS — J029 Acute pharyngitis, unspecified: Secondary | ICD-10-CM | POA: Diagnosis not present

## 2023-06-10 DIAGNOSIS — J0110 Acute frontal sinusitis, unspecified: Secondary | ICD-10-CM

## 2023-06-10 MED ORDER — GUAIFENESIN-CODEINE 100-10 MG/5ML PO SOLN: 5.0000 mL | Freq: Three times a day (TID) | ORAL | 0 refills | Status: DC | PRN

## 2023-06-10 MED ORDER — PREDNISONE 10 MG PO TABS: ORAL_TABLET | ORAL | 0 refills | Status: DC

## 2023-06-10 NOTE — Patient Instructions (Addendum)
 Thank you for coming to the office today.  Left ear drum has perforation Please call and schedule with [facility]  Prednisone  taper Hold any ibuprofen advil aleve  while on this Okay to take Tylenol  1000mg  THREE TIMES A DAY  Continue Flonase  Finish Augmentin  as prescribed Use codeine  cough syrup as needed  Please schedule a Follow-up Appointment to: Return if symptoms worsen or fail to improve.  If you have any other questions or concerns, please feel free to call the office or send a message through MyChart. You may also schedule an earlier appointment if necessary.  Additionally, you may be receiving a survey about your experience at our office within a few days to 1 week by e-mail or mail. We value your feedback.  Marsa Officer, DO [facility], [place]

## 2023-06-10 NOTE — Progress Notes (Signed)
 Subjective:    Patient ID: Michele Newton, female    DOB: 03/04/1956, 68 y.o.   MRN: [id number]  Michele Newton is a 68 y.o. female presenting on 06/10/2023 for Sinusitis and Ear Pain  PCP Angeline Laura, FNP   HPI  Discussed the use of AI scribe software for clinical note transcription with the patient, who gave verbal consent to proceed.  History of Present Illness   Michele Newton is a 68 year old female who presents with ear pain and drainage.  Last seen by PCP on 06/06/23 on Augmentin  has 5 more days left  She has persistent right ear pain and drainage that has not improved since her last visit a week ago. There is Left sided hearing loss in the affected ear and significant fluid drainage that soils her pillow. The ear is tender, and she has a history of a perforated eardrum. Hearing aids are painful due to the ear condition. - She has [facility] provider. Not scheduled yet.  She experiences associated symptoms including sore throat, hoarseness, and general malaise. She has chills and fever, with fluctuating hot and cold sensations. She attempted to soothe her throat with honey.  She has been using over-the-counter Sudafed decongestant, and Advil, 400 mg at a time, but finds it minimally effective. Flonase  has been used inconsistently, having started it prior to the onset of her current symptoms. No recent flu testing, but a negative home COVID test is reported.  Significant fatigue and difficulty resting due to her symptoms are noted.         05/02/2023    9:20 AM 02/19/2023    9:21 AM 11/01/2022    1:05 PM  Depression screen PHQ 2/9  Decreased Interest 1 3 1   Down, Depressed, Hopeless 1 2 1   PHQ - 2 Score 2 5 2   Altered sleeping 1 2 1   Tired, decreased energy 1 3 1   Change in appetite 0 2 0  Feeling bad or failure about yourself  0 1 0  Trouble concentrating 1 2 0  Moving slowly or fidgety/restless 0 1 0  Suicidal thoughts 0 0 0  PHQ-9 Score 5 16 4   Difficult doing  work/chores Somewhat difficult Extremely dIfficult Somewhat difficult       05/02/2023    9:20 AM 02/19/2023    9:21 AM 10/31/2022    9:22 AM 09/26/2022    1:48 PM  GAD 7 : Generalized Anxiety Score  Nervous, Anxious, on Edge 1 3 1 2   Control/stop worrying 1 2 1 1   Worry too much - different things 1 2 1 1   Trouble relaxing 1 2 1 1   Restless 0 1 0 1  Easily annoyed or irritable 1 2 1 1   Afraid - awful might happen 0 1 1 1   Total GAD 7 Score 5 13 6 8   Anxiety Difficulty Somewhat difficult  Somewhat difficult Somewhat difficult    Social History   Tobacco Use   Smoking status: Never   Smokeless tobacco: Never  Vaping Use   Vaping status: Never Used  Substance Use Topics   Alcohol use: No   Drug use: No    Review of Systems Per HPI unless specifically indicated above     Objective:    BP 130/80   Pulse 86   Ht 5' 6 (1.676 m)   Wt 211 lb (95.7 kg)   SpO2 97%   BMI 34.06 kg/m   Wt Readings from Last 3 Encounters:  06/10/23 211 lb (  95.7 kg)  06/06/23 212 lb (96.2 kg)  05/08/23 210 lb (95.3 kg)    Physical Exam Vitals and nursing note reviewed.  Constitutional:      General: She is not in acute distress.    Appearance: Normal appearance. She is well-developed. She is not diaphoretic.     Comments: Well-appearing, comfortable, cooperative  HENT:     Head: Normocephalic and atraumatic.     Right Ear: Ear canal and external ear normal. There is no impacted cerumen.     Left Ear: Ear canal and external ear normal.     Ears:     Comments: L TM damaged visibly on exam with perforation. No purulence or erythema.    Nose: Congestion present.  Eyes:     General:        Right eye: No discharge.        Left eye: No discharge.     Conjunctiva/sclera: Conjunctivae normal.  Cardiovascular:     Rate and Rhythm: Normal rate.  Pulmonary:     Effort: Pulmonary effort is normal.  Skin:    General: Skin is warm and dry.     Findings: No erythema or rash.  Neurological:      Mental Status: She is alert and oriented to person, place, and time.  Psychiatric:        Mood and Affect: Mood normal.        Behavior: Behavior normal.        Thought Content: Thought content normal.     Comments: Well groomed, good eye contact, normal speech and thoughts     Results for orders placed or performed in visit on 05/02/23  Lipid panel   Collection Time: 05/02/23  9:36 AM  Result Value Ref Range   Cholesterol 307 (H) <200 mg/dL   HDL 46 (L) > OR = 50 mg/dL   Triglycerides 757 (H) <150 mg/dL   LDL Cholesterol (Calc) 216 (H) mg/dL (calc)   Total CHOL/HDL Ratio 6.7 (H) <5.0 (calc)   Non-HDL Cholesterol (Calc) 261 (H) <130 mg/dL (calc)  Hemoglobin J8r   Collection Time: 05/02/23  9:36 AM  Result Value Ref Range   Hgb A1c MFr Bld 5.9 (H) <5.7 % of total Hgb   Mean Plasma Glucose 123 mg/dL   eAG (mmol/L) 6.8 mmol/L      Assessment & Plan:   Problem List Items Addressed This Visit   None Visit Diagnoses       Acute non-recurrent frontal sinusitis    -  Primary   Relevant Medications   guaiFENesin -codeine  (VIRTUSSIN A/C) 100-10 MG/5ML syrup   predniSONE  (DELTASONE ) 10 MG tablet     Pharyngitis, unspecified etiology       Relevant Medications   predniSONE  (DELTASONE ) 10 MG tablet     Perforation of left tympanic membrane            Sinusitis / L Ear Pain perforation / eustachian tube dysfunction Persistent symptoms of ear pain, throat pain, and fever despite Augmentin  treatment. Negative home COVID test. Likely viral etiology, possibly influenza from past 1 week now duration >5+ days limited utility in testing / treatment. Afebrile  -Continue Augmentin  to complete course (has several days remaining) -Start Prednisone  6-day taper for inflammation. (Hold oral NSAID) -Use Tylenol  1000mg  TID for fever and pain control. -Continue Flonase  for nasal congestion. -Consider using behind-the-counter Sudafed for additional decongestant effect PRN -Use prescription  cough syrup with codeine  for cough and to aid sleep.  L Ear Pain /  Hearing Loss / Ruptured Eardrum Left ear drainage and hearing loss. Likely related to current upper respiratory infection. -She should return to existing [facility] for further evaluation     No orders of the defined types were placed in this encounter.   Meds ordered this encounter  Medications   guaiFENesin -codeine  (VIRTUSSIN A/C) 100-10 MG/5ML syrup    Sig: Take 5 mLs by mouth 3 (three) times daily as needed for cough.    Dispense:  120 mL    Refill:  0   predniSONE  (DELTASONE ) 10 MG tablet    Sig: Take 6 tabs with breakfast Day 1, 5 tabs Day 2, 4 tabs Day 3, 3 tabs Day 4, 2 tabs Day 5, 1 tab Day 6.    Dispense:  21 tablet    Refill:  0    Follow up plan: Return if symptoms worsen or fail to improve.   Marsa Officer, DO [facility] 06/10/2023, 2:53 PM

## 2023-06-11 DIAGNOSIS — H7203 Central perforation of tympanic membrane, bilateral: Secondary | ICD-10-CM | POA: Diagnosis not present

## 2023-06-19 ENCOUNTER — Encounter: Payer: Medicare HMO | Admitting: Neurosurgery

## 2023-06-26 ENCOUNTER — Encounter: Payer: Medicare HMO | Admitting: Neurosurgery

## 2023-06-27 DIAGNOSIS — H7203 Central perforation of tympanic membrane, bilateral: Secondary | ICD-10-CM | POA: Diagnosis not present

## 2023-06-27 DIAGNOSIS — J310 Chronic rhinitis: Secondary | ICD-10-CM | POA: Diagnosis not present

## 2023-07-05 ENCOUNTER — Other Ambulatory Visit: Payer: Self-pay | Admitting: Internal Medicine

## 2023-07-07 ENCOUNTER — Other Ambulatory Visit: Payer: Self-pay

## 2023-07-07 MED ORDER — VERAPAMIL HCL ER 180 MG PO TBCR: 180.0000 mg | EXTENDED_RELEASE_TABLET | Freq: Every day | ORAL | 1 refills | Status: DC

## 2023-07-07 NOTE — Telephone Encounter (Signed)
 Requested Prescriptions  Refused Prescriptions Disp Refills   verapamil (CALAN-SR) 180 MG CR tablet [Pharmacy Med Name: VERAPAMIL ER 180MG  TABLETS] 90 tablet 1    Sig: TAKE 1 TABLET(180 MG) BY MOUTH DAILY     Cardiovascular: Calcium Channel Blockers 3 Failed - 07/07/2023  2:17 PM      Failed - ALT in normal range and within 360 days    ALT  Date Value Ref Range Status  05/01/2022 30 (H) 6 - 29 U/L Final         Failed - AST in normal range and within 360 days    AST  Date Value Ref Range Status  05/01/2022 22 10 - 35 U/L Final         Passed - Cr in normal range and within 360 days    Creat  Date Value Ref Range Status  05/01/2022 0.85 0.50 - 1.05 mg/dL Final   Creatinine, Ser  Date Value Ref Range Status  02/24/2023 0.94 0.44 - 1.00 mg/dL Final         Passed - Last BP in normal range    BP Readings from Last 1 Encounters:  06/10/23 130/80         Passed - Last Heart Rate in normal range    Pulse Readings from Last 1 Encounters:  06/10/23 86         Passed - Valid encounter within last 6 months    Recent Outpatient Visits           1 month ago Viral sinusitis   [place] [facility] [place], Salvadore Oxford, NP   2 months ago Flu vaccine need   [place] [facility] [place], [place], NP   4 months ago Acute non intractable tension-type headache   [facility] [place], Netta Neat, DO   8 months ago Encounter for general adult medical examination with abnormal findings   [facility] [place], [place], NP   9 months ago Irritable bowel syndrome with both constipation and diarrhea   [place] [facility] [place], Salvadore Oxford, NP       Future Appointments             In 3 months Baity, Salvadore Oxford, NP  [facility], [facility]

## 2023-07-10 ENCOUNTER — Other Ambulatory Visit: Payer: Self-pay | Admitting: Otolaryngology

## 2023-07-10 DIAGNOSIS — J324 Chronic pansinusitis: Secondary | ICD-10-CM

## 2023-07-11 ENCOUNTER — Other Ambulatory Visit: Payer: Self-pay | Admitting: Internal Medicine

## 2023-07-11 DIAGNOSIS — J4520 Mild intermittent asthma, uncomplicated: Secondary | ICD-10-CM

## 2023-07-11 MED ORDER — MONTELUKAST SODIUM 10 MG PO TABS: 10.0000 mg | ORAL_TABLET | Freq: Every day | ORAL | 0 refills | Status: DC

## 2023-07-11 NOTE — Telephone Encounter (Signed)
 Copied from CRM [id number]. Topic: Clinical - Medication Refill >> Jul 11, 2023  9:57 AM Payton Doughty wrote: Most Recent Primary Care Visit:  Provider: Smitty Cords  Department: [facility]  Visit Type: OFFICE VISIT  Date: 06/10/2023  Medication: montelukast (SINGULAIR) 10 MG tablet  Has the patient contacted their pharmacy? Yes  This med was sent in 12/10/2022, 90 w/ 3 refills.  Pt never picked up this medication..  The pharmacy said they never received this.  Please resend, thanks   Is this the correct pharmacy for this prescription? Yes If no, delete pharmacy and type the correct one.  This is the patient's preferred pharmacy:  [id number] - [place], [place] - [address] AT [facility] OF [address] [place] [address] [place] [address] Phone: [phone] Fax: [phone]  [facility][id number] [place], [place] - [address] AT [facility] OF [address] [place] [address] [place] [address] Phone: [phone] Fax: [phone]   Has the prescription been filled recently? No  Is the patient out of the medication? Yes  Has the patient been seen for an appointment in the last year OR does the patient have an upcoming appointment? Yes  Can we respond through MyChart? Yes  Agent: Please be advised that Rx refills may take up to 3 business days. We ask that you follow-up with your pharmacy.

## 2023-07-11 NOTE — Telephone Encounter (Signed)
 Requested Prescriptions  Pending Prescriptions Disp Refills   montelukast (SINGULAIR) 10 MG tablet 90 tablet 3    Sig: Take 1 tablet (10 mg total) by mouth at bedtime. TAKE 1 TABLET(10 MG) BY MOUTH AT BEDTIME     Pulmonology:  Leukotriene Inhibitors Passed - 07/11/2023  4:08 PM      Passed - Valid encounter within last 12 months    Recent Outpatient Visits           1 month ago Viral sinusitis   [facility] [place], Salvadore Oxford, NP   2 months ago Flu vaccine need   [facility] [place], Salvadore Oxford, NP   4 months ago Acute non intractable tension-type headache   [place] [place], Netta Neat, DO   8 months ago Encounter for general adult medical examination with abnormal findings   [facility] [place], Salvadore Oxford, NP   9 months ago Irritable bowel syndrome with both constipation and diarrhea   [facility] [place], Salvadore Oxford, NP       Future Appointments             In 3 months Baity, Salvadore Oxford, NP [facility], [facility]

## 2023-07-16 ENCOUNTER — Other Ambulatory Visit: Payer: Self-pay | Admitting: Family Medicine

## 2023-07-16 DIAGNOSIS — G43909 Migraine, unspecified, not intractable, without status migrainosus: Secondary | ICD-10-CM

## 2023-07-17 NOTE — Telephone Encounter (Signed)
 Requested medication (s) are due for refill today: routing for review  Requested medication (s) are on the active medication list: no  Last refill:  02/19/23  Future visit scheduled: yes  Notes to clinic:  Unable to refill per protocol, Rx expired. Not on current med list, possible new Rx. Routing for review.      Requested Prescriptions  Pending Prescriptions Disp Refills   rizatriptan (MAXALT-MLT) 10 MG disintegrating tablet [Pharmacy Med Name: RIZATRIPTAN ODT 10MG  TABLETS] 10 tablet 0    Sig: DISSOLVE 1 TABLET AT FIRST SIGN OF MIGRAINE HEADACHE. MAY REPEAT IN 2 HOURS IF NEEDED FOR MAX DOSE IN 24 HOURS.     Neurology:  Migraine Therapy - Triptan Passed - 07/17/2023  8:32 AM      Passed - Last BP in normal range    BP Readings from Last 1 Encounters:  06/10/23 130/80         Passed - Valid encounter within last 12 months    Recent Outpatient Visits           1 month ago Viral sinusitis   [facility] [facility] [place], Salvadore Oxford, NP   2 months ago Flu vaccine need   [place] [place], Salvadore Oxford, NP   4 months ago Acute non intractable tension-type headache   [facility] [place], Netta Neat, DO   8 months ago Encounter for general adult medical examination with abnormal findings   [place] [facility] [place], Salvadore Oxford, NP   9 months ago Irritable bowel syndrome with both constipation and diarrhea   [facility] [place], Salvadore Oxford, NP       Future Appointments             In 3 months Baity, Salvadore Oxford, NP [place], [facility]

## 2023-07-18 ENCOUNTER — Other Ambulatory Visit: Payer: Self-pay | Admitting: Internal Medicine

## 2023-07-21 ENCOUNTER — Telehealth: Payer: Self-pay

## 2023-07-21 MED ORDER — PAROXETINE HCL 40 MG PO TABS: 60.0000 mg | ORAL_TABLET | ORAL | 0 refills | Status: DC

## 2023-07-21 NOTE — Telephone Encounter (Signed)
 Refilled

## 2023-07-21 NOTE — Telephone Encounter (Signed)
 Received fax from [facility] Refill on paroxetime 40 mg

## 2023-07-21 NOTE — Addendum Note (Signed)
 Addended by: Lorre Munroe on: 07/21/2023 09:29 AM   Modules accepted: Orders

## 2023-08-08 ENCOUNTER — Encounter (INDEPENDENT_AMBULATORY_CARE_PROVIDER_SITE_OTHER): Payer: Medicare HMO | Admitting: Ophthalmology

## 2023-08-19 ENCOUNTER — Other Ambulatory Visit: Payer: Self-pay | Admitting: Family Medicine

## 2023-08-19 DIAGNOSIS — H7291 Unspecified perforation of tympanic membrane, right ear: Secondary | ICD-10-CM

## 2023-08-19 DIAGNOSIS — J320 Chronic maxillary sinusitis: Secondary | ICD-10-CM

## 2023-08-25 ENCOUNTER — Ambulatory Visit (INDEPENDENT_AMBULATORY_CARE_PROVIDER_SITE_OTHER): Admitting: Internal Medicine

## 2023-08-25 ENCOUNTER — Encounter: Payer: Self-pay | Admitting: Internal Medicine

## 2023-08-25 VITALS — BP 130/78 | HR 78 | Ht 66.0 in | Wt 214.6 lb

## 2023-08-25 DIAGNOSIS — H7292 Unspecified perforation of tympanic membrane, left ear: Secondary | ICD-10-CM

## 2023-08-25 DIAGNOSIS — H7293 Unspecified perforation of tympanic membrane, bilateral: Secondary | ICD-10-CM | POA: Diagnosis not present

## 2023-08-25 DIAGNOSIS — H7291 Unspecified perforation of tympanic membrane, right ear: Secondary | ICD-10-CM

## 2023-08-25 DIAGNOSIS — J301 Allergic rhinitis due to pollen: Secondary | ICD-10-CM | POA: Diagnosis not present

## 2023-08-25 DIAGNOSIS — J4520 Mild intermittent asthma, uncomplicated: Secondary | ICD-10-CM | POA: Diagnosis not present

## 2023-08-25 MED ORDER — METHYLPREDNISOLONE ACETATE 80 MG/ML IJ SUSP
80.0000 mg | Freq: Once | INTRAMUSCULAR | Status: AC
  Administered 2023-08-25: 80 mg via INTRAMUSCULAR

## 2023-08-25 NOTE — Patient Instructions (Signed)
 Allergic Rhinitis, Adult  Allergic rhinitis is a reaction to allergens. Allergens are things that can cause an allergic reaction. This condition affects the lining inside the nose (mucous membrane). There are two types of allergic rhinitis: Seasonal. This type is also called hay fever. It happens only during some times of the year. Perennial. This type can happen at any time of the year. This condition cannot be spread from person to person (is not contagious). It can be mild, bad, or very bad. It can develop at any age and may be outgrown. What are the causes? Pollen from grasses, trees, and weeds. Other causes can be: Dust mites. Smoke. Mold. Car fumes. The pee (urine), spit, or dander of pets. Dander is dead skin cells from a pet. What increases the risk? You are more likely to develop this condition if: You have allergies in your family. You have problems like allergies in your family. You may have: Swelling of parts of your eyes and eyelids. Asthma. This affects how you breathe. Long-term redness and swelling on your skin. Food allergies. What are the signs or symptoms? The main symptom of this condition is a runny or stuffy nose (nasal congestion). Other symptoms may include: Sneezing or coughing. Itching and tearing of your eyes. Mucus that drips down the back of your throat (postnasal drip). This may cause a sore throat. Trouble sleeping. Feeling tired. Headache. How is this treated? There is no cure for this condition. You should avoid things that you are allergic to. Treatment can help to relieve symptoms. This may include: Medicines that block allergy symptoms, such as anti-inflammatories or antihistamines. These may be given as a shot, nasal spray, or pill. Avoiding things you are allergic to. Medicines that give you some of what you are allergic to over time. This is called immunotherapy. It is done if other treatments do not help. You may get: Shots. Medicine under  your tongue. Stronger medicines, if other treatments do not help. Follow these instructions at home: Avoiding allergens Find out what things you are allergic to and avoid them. To do this, try these things: If you get allergies any time of year: Replace carpet with wood, tile, or vinyl flooring. Carpet can trap pet dander and dust. Do not smoke. Do not allow smoking in your home. Change your heating and air conditioning filters at least once a month. If you get allergies only some times of the year: Keep windows closed when you can. Plan things to do outside when pollen counts are lowest. Check pollen counts before you plan things to do outside. When you come indoors, change your clothes and shower before you sit on furniture or bedding. If you are allergic to a pet: Keep the pet out of your bedroom. Vacuum, sweep, and dust often. General instructions Take over-the-counter and prescription medicines only as told by your doctor. Drink enough fluid to keep your pee pale yellow. Where to find more information American Academy of Allergy, Asthma & Immunology: aaaai.org Contact a doctor if: You have a fever. You get a cough that does not go away. You make high-pitched whistling sounds when you breathe, most often when you breathe out (wheeze). Your symptoms slow you down. Your symptoms stop you from doing your normal things each day. Get help right away if: You are short of breath. This symptom may be an emergency. Get help right away. Call 911. Do not wait to see if the symptom will go away. Do not drive yourself to the  hospital. This information is not intended to replace advice given to you by your health care provider. Make sure you discuss any questions you have with your health care provider. Document Revised: 12/31/2021 Document Reviewed: 12/31/2021 Elsevier Patient Education  2024 [employer].

## 2023-08-25 NOTE — Progress Notes (Signed)
 Subjective:    Patient ID: Michele Newton, female    DOB: May 01, 1956, 68 y.o.   MRN: [id number]  HPI  Discussed the use of AI scribe software for clinical note transcription with the patient, who gave verbal consent to proceed.  Michele Newton is a 68 year old female with asthma who presents with persistent upper respiratory symptoms.  She has been experiencing persistent upper respiratory symptoms since January, including sinus pressure, headache, cough, sore throat, runny nose, nasal congestion, and ear pain. The mucus is described as thick and mostly clear, occasionally with a yellow tint. She has also experienced chills , feeling cold despite warm weather.  She has a history of asthma and is currently using Symbicort . Due to congestion, she has been using her nebulizer at home. She also uses Zyrtec for allergies and Flonase  nasal spray, though not as consistently as recommended.  She has visited an ENT twice for ear issues, including perforated tympanic membranes, which cause ear fullness and hearing difficulties. She describes the ENT as unhelpful and is seeking another opinion.  No nausea, vomiting, diarrhea, or body aches. She has not been around anyone sick recently and has been avoiding contact with her new great-grandchild due to her illness.       Review of Systems   Past Medical History:  Diagnosis Date   Anxiety    Arthritis    Asthma    Depression    Frequent headaches    GERD (gastroesophageal reflux disease)    Hydrocephalus (HCC) 1995   Hypertension    Migraine    PONV (postoperative nausea and vomiting)    Psoriatic arthritis (HCC)     Current Outpatient Medications  Medication Sig Dispense Refill   amoxicillin -clavulanate (AUGMENTIN ) 875-125 MG tablet Take 1 tablet by mouth 2 (two) times daily. 20 tablet 0   budesonide -formoterol  (SYMBICORT ) 160-4.5 MCG/ACT inhaler Inhale 2 puffs into the lungs 2 (two) times daily. 1 each 5   docusate sodium  (COLACE) 100 MG  capsule Take 1 capsule (100 mg total) by mouth 2 (two) times daily. 10 capsule 0   Evolocumab  (REPATHA  SURECLICK) 140 MG/ML SOAJ Inject 140 mg into the skin every 14 (fourteen) days. 6 mL 1   guaiFENesin -codeine  (VIRTUSSIN A/C) 100-10 MG/5ML syrup Take 5 mLs by mouth 3 (three) times daily as needed for cough. 120 mL 0   montelukast  (SINGULAIR ) 10 MG tablet Take 1 tablet (10 mg total) by mouth at bedtime. TAKE 1 TABLET(10 MG) BY MOUTH AT BEDTIME 90 tablet 0   Nebulizers (COMPRESSOR/NEBULIZER) MISC 1 Units by Does not apply route every 4 (four) hours as needed. 1 each 0   nortriptyline (PAMELOR) 10 MG capsule Take 10 mg by mouth.     pantoprazole  (PROTONIX ) 40 MG tablet TAKE 1 TABLET(40 MG) BY MOUTH TWICE DAILY 180 tablet 3   PARoxetine  (PAXIL ) 40 MG tablet Take 1.5 tablets (60 mg total) by mouth every morning. 135 tablet 0   predniSONE  (DELTASONE ) 10 MG tablet Take 6 tabs with breakfast Day 1, 5 tabs Day 2, 4 tabs Day 3, 3 tabs Day 4, 2 tabs Day 5, 1 tab Day 6. 21 tablet 0   QUEtiapine  (SEROQUEL ) 200 MG tablet TAKE 1 TABLET(200 MG) BY MOUTH AT BEDTIME 90 tablet 1   rizatriptan  (MAXALT -MLT) 10 MG disintegrating tablet DISSOLVE 1 TABLET AT FIRST SIGN OF MIGRAINE HEADACHE. MAY REPEAT IN 2 HOURS IF NEEDED FOR MAX DOSE IN 24 HOURS. 10 tablet 0   traZODone  (DESYREL ) 50 MG tablet TAKE 1/2 TO  1 TABLET(25 TO 50 MG) BY MOUTH AT BEDTIME AS NEEDED FOR SLEEP 90 tablet 1   verapamil  (CALAN -SR) 180 MG CR tablet Take 1 tablet (180 mg total) by mouth daily. 90 tablet 1   No current facility-administered medications for this visit.    Allergies  Allergen Reactions   Amitriptyline Other (See Comments)    hallucinations   Effexor [Venlafaxine] Other (See Comments)    Amnesia, hospitalized x 2 weeks   Haldol [Haloperidol] Other (See Comments)    Paralyzed vocal cords     Family History  Problem Relation Age of Onset   Arthritis Mother    Hyperlipidemia Mother    Heart disease Mother 79       Died of heart  failure   Hypertension Mother    Diabetes Mother    Depression Mother    Arthritis Father    Cancer Father        prostate cancer   Diabetes Brother    Breast cancer Maternal Grandmother    Colon cancer Neg Hx     Social History   Socioeconomic History   Marital status: Married    Spouse name: Ambrosio Junker   Number of children: 2   Years of education: 14   Highest education level: Associate degree: occupational, Scientist, product/process development, or vocational program  Occupational History    Employer: OTHER  Tobacco Use   Smoking status: Never   Smokeless tobacco: Never  Vaping Use   Vaping status: Never Used  Substance and Sexual Activity   Alcohol use: No   Drug use: No   Sexual activity: Not Currently    Birth control/protection: Surgical  Other Topics Concern   Not on file  Social History Narrative   Michele Newton lives at home with her husband, Ambrosio Junker, of 5 months and a cat named, Puff. She has 2 sons from a previous marriage. She has 4 grandchildren.    Social Drivers of Corporate investment banker Strain: Low Risk  (05/01/2023)   Overall Financial Resource Strain (CARDIA)    Difficulty of Paying Living Expenses: Not hard at all  Food Insecurity: No Food Insecurity (05/01/2023)   Hunger Vital Sign    Worried About Running Out of Food in the Last Year: Never true    Ran Out of Food in the Last Year: Never true  Transportation Needs: No Transportation Needs (05/01/2023)   PRAPARE - Administrator, Civil Service (Medical): No    Lack of Transportation (Non-Medical): No  Physical Activity: Inactive (05/01/2023)   Exercise Vital Sign    Days of Exercise per Week: 0 days    Minutes of Exercise per Session: 0 min  Stress: Stress Concern Present (05/01/2023)   [employer] of Occupational Health - Occupational Stress Questionnaire    Feeling of Stress : To some extent  Social Connections: Socially Integrated (05/01/2023)   Social Connection and Isolation Panel [NHANES]    Frequency of  Communication with Friends and Family: More than three times a week    Frequency of Social Gatherings with Friends and Family: Never    Attends Religious Services: 1 to 4 times per year    Active Member of [employer] or Organizations: Yes    Attends Engineer, structural: More than 4 times per year    Marital Status: Married  Catering manager Violence: Not At Risk (02/26/2023)   Humiliation, Afraid, Rape, and Kick questionnaire    Fear of Current or Ex-Partner: No    Emotionally Abused:  No    Physically Abused: No    Sexually Abused: No     Constitutional: Pt reports headache, chills. Denies fever, malaise, fatigue, or abrupt weight changes.  HEENT: Pt reports sinus pressure, runny nose, nasal congestion, ear pain, and sore throat. Denies eye pain, eye redness, ringing in the ears, wax buildup, bloody nose. Respiratory: Pt reports cough and shortness of breath. Denies difficulty breathing. Cardiovascular: Denies chest pain, chest tightness, palpitations or swelling in the hands or feet.  Gastrointestinal: Denies abdominal pain, bloating, constipation, diarrhea or blood in the stool.  GU: Denies urgency, frequency, pain with urination, burning sensation, blood in urine, odor or discharge. Musculoskeletal: Denies decrease in range of motion, difficulty with gait, muscle pain or joint pain and swelling.  Skin: Denies redness, rashes, lesions or ulcercations.  Neurological: Denies dizziness, difficulty with memory, difficulty with speech or problems with balance and coordination.  Psych: Denies anxiety, depression, SI/HI.  No other specific complaints in a complete review of systems (except as listed in HPI above).      Objective:   Physical Exam BP 130/78 (BP Location: Left Arm, Patient Position: Sitting, Cuff Size: Large)   Pulse 78   Ht 5\' 6"  (1.676 m)   Wt 214 lb 9.6 oz (97.3 kg)   SpO2 98%   BMI 34.64 kg/m   Wt Readings from Last 3 Encounters:  06/10/23 211 lb (95.7 kg)   06/06/23 212 lb (96.2 kg)  05/08/23 210 lb (95.3 kg)    General: Appears her stated age, obese, in NAD. Skin: Warm, dry and intact.  HEENT: Head: normal shape and size, no sinus tenderness noted; Eyes: sclera white, no icterus, conjunctiva pink, PERRLA and EOMs intact; Ears: Tm's gray with bilateral perforations; Nose: mucosa pink and moist, septum midline; Throat/Mouth: Teeth present, mucosa pink and moist, + PND, no exudate, lesions or ulcerations noted.  Neck: No adenopathy noted. Cardiovascular: Normal rate and rhythm. S1,S2 noted.  No murmur, rubs or gallops noted.  Pulmonary/Chest: Normal effort and positive vesicular breath sounds. No respiratory distress. No wheezes, rales or ronchi noted.  Neurological: Alert and oriented.   BMET    Component Value Date/Time   NA 137 02/24/2023 1219   K 4.8 02/24/2023 1219   CL 103 02/24/2023 1219   CO2 27 02/24/2023 1219   GLUCOSE 102 (H) 02/24/2023 1219   BUN 14 02/24/2023 1219   CREATININE 0.94 02/24/2023 1219   CREATININE 0.85 05/01/2022 1046   CALCIUM  9.0 02/24/2023 1219   GFRNONAA >60 02/24/2023 1219   GFRNONAA 69 10/09/2017 1024   GFRAA >60 11/20/2018 0408   GFRAA 79 10/09/2017 1024    Lipid Panel     Component Value Date/Time   CHOL 307 (H) 05/02/2023 0936   TRIG 242 (H) 05/02/2023 0936   HDL 46 (L) 05/02/2023 0936   CHOLHDL 6.7 (H) 05/02/2023 0936   VLDL 33.0 02/20/2017 0841   LDLCALC 216 (H) 05/02/2023 0936    CBC    Component Value Date/Time   WBC 7.6 02/24/2023 1219   RBC 5.30 (H) 02/24/2023 1219   HGB 14.6 02/24/2023 1219   HCT 45.3 02/24/2023 1219   PLT 410 (H) 02/24/2023 1219   MCV 85.5 02/24/2023 1219   MCH 27.5 02/24/2023 1219   MCHC 32.2 02/24/2023 1219   RDW 13.3 02/24/2023 1219   LYMPHSABS 2.2 07/18/2020 1054   MONOABS 0.8 07/18/2020 1054   EOSABS 0.1 07/18/2020 1054   BASOSABS 0.1 07/18/2020 1054    Hgb A1C Lab Results  Component Value Date   HGBA1C 5.9 (H) 05/02/2023             Assessment & Plan:  Assessment and Plan    Allergic Rhinitis Chronic upper respiratory symptoms due to allergies, unresponsive to antibiotics. Symptoms exacerbated by seasonal allergies. Decision to administer steroid injection due to severity. - Administer 80 mg Depo Medrol  intramuscularly. - Instruct to use Flonase  twice daily for three days, then reduce to once daily. - Continue Zyrtec. - Recommend Sudafed for no more than three days. - Refer for allergy testing.  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 No infection. ENT follow-up ongoing. - Continue follow-up with ENT for management of perforated tympanic membranes.  Asthma Asthma stable with current Symbicort  use. No acute exacerbation. - Continue current asthma management with Symbicort .        RTC in 2 months for your annual exam Helayne Lo, NP

## 2023-09-20 ENCOUNTER — Other Ambulatory Visit: Payer: Self-pay | Admitting: Internal Medicine

## 2023-09-20 DIAGNOSIS — G4709 Other insomnia: Secondary | ICD-10-CM

## 2023-09-23 ENCOUNTER — Encounter: Payer: Self-pay | Admitting: Internal Medicine

## 2023-09-23 DIAGNOSIS — K219 Gastro-esophageal reflux disease without esophagitis: Secondary | ICD-10-CM

## 2023-09-23 MED ORDER — PANTOPRAZOLE SODIUM 40 MG PO TBEC: 40.0000 mg | DELAYED_RELEASE_TABLET | Freq: Two times a day (BID) | ORAL | 0 refills | Status: DC

## 2023-09-23 NOTE — Telephone Encounter (Signed)
 Requested medications are due for refill today.  A little too soon.  Requested medications are on the active medications list.  yes  Last refill. 04/11/2023 #90 1 rf  Future visit scheduled.   yes  Notes to clinic.  Refill not delegated.    Requested Prescriptions  Pending Prescriptions Disp Refills   QUEtiapine  (SEROQUEL ) 200 MG tablet [Pharmacy Med Name: QUETIAPINE  200MG  TABLETS] 90 tablet 1    Sig: TAKE 1 TABLET(200 MG) BY MOUTH AT BEDTIME     Not Delegated - Psychiatry:  Antipsychotics - Second Generation (Atypical) - quetiapine  Failed - 09/23/2023 12:35 PM      Failed - This refill cannot be delegated      Failed - TSH in normal range and within 360 days    TSH  Date Value Ref Range Status  04/23/2021 1.21 0.40 - 4.50 mIU/L Final         Failed - Lipid Panel in normal range within the last 12 months    Cholesterol  Date Value Ref Range Status  05/02/2023 307 (H) <200 mg/dL Final   LDL Cholesterol (Calc)  Date Value Ref Range Status  05/02/2023 216 (H) mg/dL (calc) Final    Comment:    LDL-C levels > or = 190 mg/dL may indicate familial  hypercholesterolemia (FH). Clinical assessment and  measurement of blood lipid levels should be  considered for all first degree relatives of patients with an FH diagnosis. LDL Cholesterol (LDL-C) levels > or = 300 mg/dL may indicate homozygous familial hypercholesterolemia (HoFH). Untreated,  these extremely high LDL-C levels can result in premature CV events and mortality. Patients should be identified early and provided appropriate interventions to reduce the cumulative LDL-C burden from birth. . For questions about testing for familial hypercholesterolemia, please call Engineer, materials at [phone].GENE.INFO. Norberto Bear, et al. J National Lipid Association  Recommendations for Patient-Centered Management of Dyslipidemia: Part 1 Journal of  Clinical Lipidology 2015;9(2), 129-169. Cuchel, M. et al. (2014).  Homozygous familial hypercholesterolaemia: new insights and guidance for clinicians to improve detection and clinical management. European Heart Journal, 35(32), [phone]. Reference range: <100 . Desirable range <100 mg/dL for primary prevention;   <70 mg/dL for patients with CHD or diabetic patients  with > or = 2 CHD risk factors. Aaron Aas LDL-C is now calculated using the Martin-Hopkins  calculation, which is a validated novel method providing  better accuracy than the Friedewald equation in the  estimation of LDL-C.  Melinda Sprawls et al. Erroll Heard. 7829;562(13): 2061-2068  (http://education.[facility].com/faq/FAQ164)    HDL  Date Value Ref Range Status  05/02/2023 46 (L) > OR = 50 mg/dL Final   Triglycerides  Date Value Ref Range Status  05/02/2023 242 (H) <150 mg/dL Final    Comment:    . If a non-fasting specimen was collected, consider repeat triglyceride testing on a fasting specimen if clinically indicated.  Imagene Mam et al. J. of Clin. Lipidol. 2015;9:129-169. .          Failed - CBC within normal limits and completed in the last 12 months    WBC  Date Value Ref Range Status  02/24/2023 7.6 4.0 - 10.5 K/uL Final   RBC  Date Value Ref Range Status  02/24/2023 5.30 (H) 3.87 - 5.11 MIL/uL Final   Hemoglobin  Date Value Ref Range Status  02/24/2023 14.6 12.0 - 15.0 g/dL Final   HCT  Date Value Ref Range Status  02/24/2023 45.3 36.0 - 46.0 % Final   MCHC  Date Value Ref Range Status  02/24/2023 32.2 30.0 - 36.0 g/dL Final   [facility]  Date Value Ref Range Status  02/24/2023 27.5 26.0 - 34.0 pg Final   MCV  Date Value Ref Range Status  02/24/2023 85.5 80.0 - 100.0 fL Final   No results found for: "PLTCOUNTKUC", "LABPLAT", "POCPLA" RDW  Date Value Ref Range Status  02/24/2023 13.3 11.5 - 15.5 % Final         Failed - CMP within normal limits and completed in the last 12 months    Albumin  Date Value Ref Range Status  03/07/2022 4.1 3.5 - 5.0 g/dL Final    Alkaline Phosphatase  Date Value Ref Range Status  03/07/2022 103 38 - 126 U/L Final   Alkaline phosphatase (APISO)  Date Value Ref Range Status  05/01/2022 108 37 - 153 U/L Final   ALT  Date Value Ref Range Status  05/01/2022 30 (H) 6 - 29 U/L Final   AST  Date Value Ref Range Status  05/01/2022 22 10 - 35 U/L Final   BUN  Date Value Ref Range Status  02/24/2023 14 8 - 23 mg/dL Final   Calcium   Date Value Ref Range Status  02/24/2023 9.0 8.9 - 10.3 mg/dL Final   CO2  Date Value Ref Range Status  02/24/2023 27 22 - 32 mmol/L Final   Creat  Date Value Ref Range Status  05/01/2022 0.85 0.50 - 1.05 mg/dL Final   Creatinine, Ser  Date Value Ref Range Status  02/24/2023 0.94 0.44 - 1.00 mg/dL Final   Glucose, Bld  Date Value Ref Range Status  02/24/2023 102 (H) 70 - 99 mg/dL Final    Comment:    Glucose reference range applies only to samples taken after fasting for at least 8 hours.   Potassium  Date Value Ref Range Status  02/24/2023 4.8 3.5 - 5.1 mmol/L Final   Sodium  Date Value Ref Range Status  02/24/2023 137 135 - 145 mmol/L Final   Total Bilirubin  Date Value Ref Range Status  05/01/2022 0.3 0.2 - 1.2 mg/dL Final   Protein, ur  Date Value Ref Range Status  12/14/2021 NEGATIVE NEGATIVE mg/dL Final   Protein, UA  Date Value Ref Range Status  06/14/2022 Negative Negative Final   Total Protein  Date Value Ref Range Status  05/01/2022 6.9 6.1 - 8.1 g/dL Final   GFR, Est African American  Date Value Ref Range Status  10/09/2017 79 > OR = 60 mL/min/1.100m2 Final   GFR calc Af Amer  Date Value Ref Range Status  11/20/2018 >60 >60 mL/min Final   GFR  Date Value Ref Range Status  02/20/2017 76.18 >60.00 mL/min Final   eGFR  Date Value Ref Range Status  05/01/2022 75 > OR = 60 mL/min/1.19m2 Final   GFR, Est Non African American  Date Value Ref Range Status  10/09/2017 69 > OR = 60 mL/min/1.58m2 Final   GFR, Estimated  Date Value Ref  Range Status  02/24/2023 >60 >60 mL/min Final    Comment:    (NOTE) Calculated using the CKD-EPI Creatinine Equation (2021)          Passed - Completed PHQ-2 or PHQ-9 in the last 360 days      Passed - Last BP in normal range    BP Readings from Last 1 Encounters:  08/25/23 130/78         Passed - Last Heart Rate in normal range  Pulse Readings from Last 1 Encounters:  08/25/23 78         Passed - Valid encounter within last 6 months    Recent Outpatient Visits           4 weeks ago Seasonal allergic rhinitis due to pollen   [facility] [place], Rankin Buzzard, NP   3 months ago Acute non-recurrent frontal sinusitis   [facility] Raina Bunting, DO       Future Appointments             In 1 month Baity, Rankin Buzzard, NP [place] [facility]

## 2023-10-22 ENCOUNTER — Ambulatory Visit (INDEPENDENT_AMBULATORY_CARE_PROVIDER_SITE_OTHER): Admitting: Otolaryngology

## 2023-10-22 ENCOUNTER — Encounter (INDEPENDENT_AMBULATORY_CARE_PROVIDER_SITE_OTHER): Payer: Self-pay | Admitting: Otolaryngology

## 2023-10-22 VITALS — BP 158/93 | HR 103

## 2023-10-22 DIAGNOSIS — H7203 Central perforation of tympanic membrane, bilateral: Secondary | ICD-10-CM

## 2023-10-22 DIAGNOSIS — R0981 Nasal congestion: Secondary | ICD-10-CM

## 2023-10-22 DIAGNOSIS — J310 Chronic rhinitis: Secondary | ICD-10-CM

## 2023-10-22 DIAGNOSIS — H7293 Unspecified perforation of tympanic membrane, bilateral: Secondary | ICD-10-CM

## 2023-10-22 DIAGNOSIS — H9193 Unspecified hearing loss, bilateral: Secondary | ICD-10-CM

## 2023-10-22 DIAGNOSIS — H903 Sensorineural hearing loss, bilateral: Secondary | ICD-10-CM

## 2023-10-22 DIAGNOSIS — J324 Chronic pansinusitis: Secondary | ICD-10-CM

## 2023-10-22 DIAGNOSIS — J343 Hypertrophy of nasal turbinates: Secondary | ICD-10-CM

## 2023-10-24 DIAGNOSIS — J343 Hypertrophy of nasal turbinates: Secondary | ICD-10-CM | POA: Insufficient documentation

## 2023-10-24 DIAGNOSIS — H7203 Central perforation of tympanic membrane, bilateral: Secondary | ICD-10-CM | POA: Insufficient documentation

## 2023-10-24 DIAGNOSIS — H903 Sensorineural hearing loss, bilateral: Secondary | ICD-10-CM | POA: Insufficient documentation

## 2023-10-24 DIAGNOSIS — J324 Chronic pansinusitis: Secondary | ICD-10-CM | POA: Insufficient documentation

## 2023-10-24 NOTE — Addendum Note (Signed)
 Addended byReynold Caves on: 10/24/2023 05:29 AM   Modules accepted: Orders

## 2023-10-24 NOTE — Progress Notes (Signed)
 CC: Recurrent sinusitis, bilateral tympanic membrane perforations, hearing loss  HPI:  Michele Newton is a 68 y.o. 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  According to the patient, she has been symptomatic for 15+ years.  She previously underwent endoscopic sinus surgery 10 years ago by an Film/video editor ENT physician.  She also has a history of bilateral middle ear effusion, requiring bilateral myringotomy and tube placement.  Both tubes have since extruded.  It resulted in bilateral tympanic membrane perforations.  Currently she complains of frequent headaches, nasal congestion, postnasal drainage, and recurrent coughing spells.  She also has clogging sensation in the ears with bilateral decreased hearing.  She wears bilateral hearing aids.  She is currently on Singulair , Flonase , and azelastine nasal sprays.  She denies any fever or visual change.  Past Medical History:  Diagnosis Date   Anxiety    Arthritis    Asthma    Depression    Frequent headaches    GERD (gastroesophageal reflux disease)    Hydrocephalus (HCC) 1995   Hypertension    Migraine    PONV (postoperative nausea and vomiting)    Psoriatic arthritis (HCC)     Past Surgical History:  Procedure Laterality Date   BRAIN SURGERY     VA shunt   BREAST SURGERY  2000   biopsy   EYE SURGERY     NASAL SINUS SURGERY  1998   SHUNT REVISION VENTRICULAR-PERITONEAL Right 02/26/2023   Procedure: VENTRICULOPERITONEAL SHUNT REVISION;  Surgeon: Jodeen Munch, MD;  Location: [facility];  Service: Neurosurgery;  Laterality: Right;   TENDON REPAIR Left    hand   TONSILLECTOMY AND ADENOIDECTOMY  1985   TOTAL HIP ARTHROPLASTY Left 11/19/2018   Procedure: TOTAL HIP ARTHROPLASTY ANTERIOR APPROACH;  Surgeon: Molli Angelucci, MD;  Location: [facility];  Service: Orthopedics;  Laterality: Left;   TOTAL HIP ARTHROPLASTY Right 07/27/2020   Procedure: TOTAL HIP ARTHROPLASTY ANTERIOR  APPROACH;  Surgeon: Molli Angelucci, MD;  Location: [facility];  Service: Orthopedics;  Laterality: Right;   TOTAL HIP REVISION Right 12/25/2021   Procedure: Right hip revision, acetabular component & femoral head;  Surgeon: Molli Angelucci, MD;  Location: [facility];  Service: Orthopedics;  Laterality: Right;   TUBAL LIGATION     TYMPANOSTOMY TUBE PLACEMENT  1998   VENTRICULAR ATRIAL SHUNT Right 1995    Family History  Problem Relation Age of Onset   Arthritis Mother    Hyperlipidemia Mother    Heart disease Mother 33       Died of heart failure   Hypertension Mother    Diabetes Mother    Depression Mother    Arthritis Father    Cancer Father        prostate cancer   Diabetes Brother    Breast cancer Maternal Grandmother    Colon cancer Neg Hx     Social History:  reports that she has never smoked. She has never used smokeless tobacco. She reports that she does not drink alcohol and does not use drugs.  Allergies:  Allergies  Allergen Reactions   Amitriptyline Other (See Comments)    hallucinations   Effexor [Venlafaxine] Other (See Comments)    Amnesia, hospitalized x 2 weeks   Haldol [Haloperidol] Other (See Comments)    Paralyzed vocal cords     Prior to Admission medications   Medication Sig Start Date End Date Taking? Authorizing Provider  budesonide -formoterol  (SYMBICORT ) 160-4.5 MCG/ACT inhaler Inhale 2 puffs into the lungs  2 (two) times daily. 05/02/23  Yes Carollynn Cirri, NP  montelukast  (SINGULAIR ) 10 MG tablet Take 1 tablet (10 mg total) by mouth at bedtime. TAKE 1 TABLET(10 MG) BY MOUTH AT BEDTIME 07/11/23  Yes Baity, Rankin Buzzard, NP  Nebulizers (COMPRESSOR/NEBULIZER) MISC 1 Units by Does not apply route every 4 (four) hours as needed. 03/08/22  Yes Sung, Jade J, MD  nortriptyline (PAMELOR) 10 MG capsule Take 10 mg by mouth. 04/23/23  Yes [provider]  ofloxacin (OCUFLOX) 0.3 % ophthalmic solution INSTILL 4 DROPS IN LEFT EAR TWICE DAILY FOR 7 DAYS 08/16/23  Yes  [provider]  pantoprazole  (PROTONIX ) 40 MG tablet Take 1 tablet (40 mg total) by mouth 2 (two) times daily. 09/23/23  Yes Carollynn Cirri, NP  PARoxetine  (PAXIL ) 40 MG tablet Take 1.5 tablets (60 mg total) by mouth every morning. 07/21/23  Yes Baity, Rankin Buzzard, NP  QUEtiapine  (SEROQUEL ) 200 MG tablet TAKE 1 TABLET(200 MG) BY MOUTH AT BEDTIME 09/23/23  Yes Baity, Rankin Buzzard, NP  traZODone  (DESYREL ) 50 MG tablet TAKE 1/2 TO 1 TABLET(25 TO 50 MG) BY MOUTH AT BEDTIME AS NEEDED FOR SLEEP 12/10/22  Yes Carollynn Cirri, NP  verapamil  (CALAN -SR) 180 MG CR tablet Take 1 tablet (180 mg total) by mouth daily. 07/07/23  Yes Baity, Rankin Buzzard, NP  docusate sodium  (COLACE) 100 MG capsule Take 1 capsule (100 mg total) by mouth 2 (two) times daily. 12/26/21   Coralyn Derry, PA-C  Evolocumab  (REPATHA  SURECLICK) 140 MG/ML SOAJ Inject 140 mg into the skin every 14 (fourteen) days. 05/05/23   Carollynn Cirri, NP  rizatriptan  (MAXALT -MLT) 10 MG disintegrating tablet DISSOLVE 1 TABLET AT FIRST SIGN OF MIGRAINE HEADACHE. MAY REPEAT IN 2 HOURS IF NEEDED FOR MAX DOSE IN 24 HOURS. 07/17/23   Carollynn Cirri, NP    Blood pressure (!) 158/93, pulse (!) 103, SpO2 94%. Exam: General: Communicates without difficulty, well nourished, no acute distress. Head: Normocephalic, no evidence injury, no tenderness, facial buttresses intact without stepoff. Face/sinus: No tenderness to palpation and percussion. Facial movement is normal and symmetric. Eyes: PERRL, EOMI. No scleral icterus, conjunctivae clear. Neuro: CN II exam reveals vision grossly intact.  No nystagmus at any point of gaze. Ears: Auricles well formed without lesions.  Ear canals are intact without mass or lesion.  No erythema or edema is appreciated.  A 30% left tympanic membrane perforation and a 10% right tympanic membrane perforation are noted.  Nose: External evaluation reveals normal support and skin without lesions.  Dorsum is intact.  Anterior rhinoscopy reveals  congested mucosa over anterior aspect of inferior turbinates and intact septum.  No purulence noted. Oral:  Oral cavity and oropharynx are intact, symmetric, without erythema or edema.  Mucosa is moist without lesions. Neck: Full range of motion without pain.  There is no significant lymphadenopathy.  No masses palpable.  Thyroid  bed within normal limits to palpation.  Parotid glands and submandibular glands equal bilaterally without mass.  Trachea is midline. Neuro:  CN 2-12 grossly intact.   Procedure:  Flexible Nasal Endoscopy: Description: Risks, benefits, and alternatives of flexible endoscopy were explained to the patient.  Specific mention was made of the risk of throat numbness with difficulty swallowing, possible bleeding from the nose and mouth, and pain from the procedure.  The patient gave oral consent to proceed.  The flexible scope was inserted into the right nasal cavity.  Endoscopy of the interior nasal cavity, superior, inferior, and middle meatus  was performed. The sphenoid-ethmoid recess was examined. Edematous mucosa was noted.  Sinus openings were patent.  No polyp, mass, or lesion was appreciated.  Olfactory cleft was clear.  Nasopharynx was clear.  Turbinates were hypertrophied but without mass.  The procedure was repeated on the contralateral side with similar findings.  The patient tolerated the procedure well.   Assessment: 1.  Recurrent rhinosinusitis, with nasal mucosal congestion and bilateral inferior turbinate hypertrophy. 2.  The sinus openings are patent on today's nasal endoscopy examination. 3.  Bilateral tympanic membrane perforations. 4.  Bilateral hearing loss.  Plan: 1.  The physical exam and nasal endoscopy findings are reviewed with the patient. 2.  Continue with her current allergy treatment regimen of Flonase , azelastine, and Singulair . 3.  Sinus CT scan to evaluate for chronic rhinosinusitis. 4.  Nasal saline irrigation daily. 5.  The patient will return for  reevaluation in 4 weeks.  We will obtain a hearing test at that time.  Inanna Telford W Madison Albea 10/24/2023, 5:17 AM

## 2023-11-03 ENCOUNTER — Ambulatory Visit: Payer: Self-pay | Admitting: Internal Medicine

## 2023-11-12 ENCOUNTER — Ambulatory Visit
Admission: RE | Admit: 2023-11-12 | Discharge: 2023-11-12 | Disposition: A | Discharge status: Home / Self Care | Source: Ambulatory Visit | Attending: Otolaryngology | Admitting: Otolaryngology

## 2023-11-12 DIAGNOSIS — J324 Chronic pansinusitis: Secondary | ICD-10-CM | POA: Diagnosis present

## 2023-11-13 ENCOUNTER — Ambulatory Visit: Payer: Medicare HMO

## 2023-11-13 VITALS — Ht 66.0 in | Wt 214.0 lb

## 2023-11-13 DIAGNOSIS — Z0000 Encounter for general adult medical examination without abnormal findings: Secondary | ICD-10-CM

## 2023-11-13 DIAGNOSIS — Z13820 Encounter for screening for osteoporosis: Secondary | ICD-10-CM

## 2023-11-13 DIAGNOSIS — Z1231 Encounter for screening mammogram for malignant neoplasm of breast: Secondary | ICD-10-CM

## 2023-11-13 NOTE — Progress Notes (Signed)
 Subjective:   Michele Newton is a 67 y.o. who presents for a Medicare Wellness preventive visit.  As a reminder, Annual Wellness Visits don't include a physical exam, and some assessments may be limited, especially if this visit is performed virtually. We may recommend an in-person follow-up visit with your provider if needed.  Visit Complete: Virtual I connected with  Michele Newton on 11/13/23 by a audio enabled telemedicine application and verified that I am speaking with the correct person using two identifiers.  Patient Location: Home  Provider Location: Home Office  I discussed the limitations of evaluation and management by telemedicine. The patient expressed understanding and agreed to proceed.  Vital Signs: Because this visit was a virtual/telehealth visit, some criteria may be missing or patient reported. Any vitals not documented were not able to be obtained and vitals that have been documented are patient reported.    Persons Participating in Visit: Patient.  AWV Questionnaire: No: Patient Medicare AWV questionnaire was not completed prior to this visit.  Cardiac Risk Factors include: advanced age (>36men, >34 women);hypertension     Objective:    Today's Vitals   11/13/23 1520  Weight: 214 lb (97.1 kg)  Height: 5' 6 (1.676 m)   Body mass index is 34.54 kg/m.     11/13/2023    3:26 PM 02/26/2023    6:17 AM 02/24/2023   11:54 AM 11/01/2022    1:07 PM 03/07/2022    6:51 PM 12/25/2021   10:39 AM 12/14/2021    9:09 AM  Advanced Directives  Does Patient Have a Medical Advance Directive? No No No No No No No  Would patient like information on creating a medical advance directive? No - Patient declined No - Patient declined No - Patient declined No - Patient declined       Current Medications (verified) Outpatient Encounter Medications as of 11/13/2023  Medication Sig   budesonide -formoterol  (SYMBICORT ) 160-4.5 MCG/ACT inhaler Inhale 2 puffs into the lungs 2 (two)  times daily.   docusate sodium  (COLACE) 100 MG capsule Take 1 capsule (100 mg total) by mouth 2 (two) times daily.   Evolocumab  (REPATHA  SURECLICK) 140 MG/ML SOAJ Inject 140 mg into the skin every 14 (fourteen) days.   montelukast  (SINGULAIR ) 10 MG tablet Take 1 tablet (10 mg total) by mouth at bedtime. TAKE 1 TABLET(10 MG) BY MOUTH AT BEDTIME   Nebulizers (COMPRESSOR/NEBULIZER) MISC 1 Units by Does not apply route every 4 (four) hours as needed.   nortriptyline (PAMELOR) 10 MG capsule Take 10 mg by mouth.   ofloxacin (OCUFLOX) 0.3 % ophthalmic solution INSTILL 4 DROPS IN LEFT EAR TWICE DAILY FOR 7 DAYS   pantoprazole  (PROTONIX ) 40 MG tablet Take 1 tablet (40 mg total) by mouth 2 (two) times daily.   PARoxetine  (PAXIL ) 40 MG tablet Take 1.5 tablets (60 mg total) by mouth every morning.   QUEtiapine  (SEROQUEL ) 200 MG tablet TAKE 1 TABLET(200 MG) BY MOUTH AT BEDTIME   rizatriptan  (MAXALT -MLT) 10 MG disintegrating tablet DISSOLVE 1 TABLET AT FIRST SIGN OF MIGRAINE HEADACHE. MAY REPEAT IN 2 HOURS IF NEEDED FOR MAX DOSE IN 24 HOURS.   traZODone  (DESYREL ) 50 MG tablet TAKE 1/2 TO 1 TABLET(25 TO 50 MG) BY MOUTH AT BEDTIME AS NEEDED FOR SLEEP   verapamil  (CALAN -SR) 180 MG CR tablet Take 1 tablet (180 mg total) by mouth daily.   No facility-administered encounter medications on file as of 11/13/2023.    Allergies (verified) Amitriptyline, Effexor [venlafaxine], and Haldol [haloperidol]  History: Past Medical History:  Diagnosis Date   Anxiety    Arthritis    Asthma    Depression    Frequent headaches    GERD (gastroesophageal reflux disease)    Hydrocephalus (HCC) 1995   Hypertension    Migraine    PONV (postoperative nausea and vomiting)    Psoriatic arthritis (HCC)    Past Surgical History:  Procedure Laterality Date   BRAIN SURGERY     VA shunt   BREAST SURGERY  2000   biopsy   EYE SURGERY     NASAL SINUS SURGERY  1998   SHUNT REVISION VENTRICULAR-PERITONEAL Right 02/26/2023    Procedure: VENTRICULOPERITONEAL SHUNT REVISION;  Surgeon: Clois Fret, MD;  Location: [facility];  Service: Neurosurgery;  Laterality: Right;   TENDON REPAIR Left    hand   TONSILLECTOMY AND ADENOIDECTOMY  1985   TOTAL HIP ARTHROPLASTY Left 11/19/2018   Procedure: TOTAL HIP ARTHROPLASTY ANTERIOR APPROACH;  Surgeon: Kathlynn Sharper, MD;  Location: [facility];  Service: Orthopedics;  Laterality: Left;   TOTAL HIP ARTHROPLASTY Right 07/27/2020   Procedure: TOTAL HIP ARTHROPLASTY ANTERIOR APPROACH;  Surgeon: Kathlynn Sharper, MD;  Location: [facility];  Service: Orthopedics;  Laterality: Right;   TOTAL HIP REVISION Right 12/25/2021   Procedure: Right hip revision, acetabular component & femoral head;  Surgeon: Kathlynn Sharper, MD;  Location: [facility];  Service: Orthopedics;  Laterality: Right;   TUBAL LIGATION     TYMPANOSTOMY TUBE PLACEMENT  1998   VENTRICULAR ATRIAL SHUNT Right 1995   Family History  Problem Relation Age of Onset   Arthritis Mother    Hyperlipidemia Mother    Heart disease Mother 79       Died of heart failure   Hypertension Mother    Diabetes Mother    Depression Mother    Arthritis Father    Cancer Father        prostate cancer   Diabetes Brother    Breast cancer Maternal Grandmother    Colon cancer Neg Hx    Social History   Socioeconomic History   Marital status: Married    Spouse name: Michele Newton   Number of children: 2   Years of education: 14   Highest education level: Some college, no degree  Occupational History    Employer: OTHER  Tobacco Use   Smoking status: Never   Smokeless tobacco: Never  Vaping Use   Vaping status: Never Used  Substance and Sexual Activity   Alcohol use: No   Drug use: No   Sexual activity: Not Currently    Birth control/protection: Surgical  Other Topics Concern   Not on file  Social History Narrative   Michele Newton lives at home with her husband, Michele Newton, of 5 months and a cat named, Puff. She has 2 sons from a previous marriage. She  has 4 grandchildren.    Social Drivers of Corporate investment banker Strain: Low Risk  (11/13/2023)   Overall Financial Resource Strain (CARDIA)    Difficulty of Paying Living Expenses: Not hard at all  Recent Concern: Financial Resource Strain - Medium Risk (11/12/2023)   Overall Financial Resource Strain (CARDIA)    Difficulty of Paying Living Expenses: Somewhat hard  Food Insecurity: No Food Insecurity (11/13/2023)   Hunger Vital Sign    Worried About Running Out of Food in the Last Year: Never true    Ran Out of Food in the Last Year: Never true  Transportation Needs: No Transportation Needs (11/13/2023)  PRAPARE - Administrator, Civil Service (Medical): No    Lack of Transportation (Non-Medical): No  Physical Activity: Inactive (11/13/2023)   Exercise Vital Sign    Days of Exercise per Week: 0 days    Minutes of Exercise per Session: 0 min  Stress: No Stress Concern Present (11/13/2023)   [employer] of Occupational Health - Occupational Stress Questionnaire    Feeling of Stress: Not at all  Social Connections: Socially Integrated (11/13/2023)   Social Connection and Isolation Panel    Frequency of Communication with Friends and Family: More than three times a week    Frequency of Social Gatherings with Friends and Family: More than three times a week    Attends Religious Services: More than 4 times per year    Active Member of [employer] or Organizations: Yes    Attends Engineer, structural: More than 4 times per year    Marital Status: Married    Tobacco Counseling Counseling given: Not Answered    Clinical Intake:  Pre-visit preparation completed: Yes  Pain : No/denies pain     BMI - recorded: 34.54 Nutritional Status: BMI > 30  Obese Nutritional Risks: None Diabetes: No  Lab Results  Component Value Date   HGBA1C 5.9 (H) 05/02/2023   HGBA1C 5.5 10/29/2021   HGBA1C 5.5 08/09/2019     How often do you need to have someone help you  when you read instructions, pamphlets, or other written materials from your doctor or pharmacy?: 1 - Never  Interpreter Needed?: No  Information entered by :: Michele Blush LPN   Activities of Daily Living     11/13/2023    3:24 PM 02/26/2023   12:15 PM  In your present state of health, do you have any difficulty performing the following activities:  Hearing? 1   Comment Wears Hearing Aids   Vision? 0   Difficulty concentrating or making decisions? 0   Walking or climbing stairs? 1   Comment Uses a Cane   Dressing or bathing? 0   Doing errands, shopping? 0 0  Preparing Food and eating ? N   Using the Toilet? N   In the past six months, have you accidently leaked urine? Y   Comment Wears Pads. Followed by PCP   Do you have problems with loss of bowel control? N   Managing your Medications? N   Managing your Finances? N   Housekeeping or managing your Housekeeping? N     Patient Care Team: Antonette Angeline ORN, NP as PCP - General (Internal Medicine)  I have updated your Care Teams any recent Medical Services you may have received from other providers in the past year.     Assessment:   This is a routine wellness examination for Michele Newton.  Hearing/Vision screen Hearing Screening - Comments:: Wears Hearing Aids Vision Screening - Comments:: Wears rx glasses - up to date with routine eye exams with  Dr Mevelyn   Goals Addressed               This Visit's Progress     Increase physical activity (pt-stated)         Depression Screen     11/13/2023    3:24 PM 08/25/2023   11:47 AM 05/02/2023    9:20 AM 02/19/2023    9:21 AM 11/01/2022    1:05 PM 10/31/2022    9:21 AM 09/26/2022    1:48 PM  PHQ 2/9 Scores  PHQ - 2  Score 0 0 2 5 2 2 2   PHQ- 9 Score  3 5 16 4 6 4     Fall Risk     11/13/2023    3:26 PM 08/25/2023   11:47 AM 05/02/2023    9:20 AM 02/19/2023    9:22 AM 11/01/2022    1:07 PM  Fall Risk   Falls in the past year? 0 0 0 0 0  Number falls in past yr: 0   1  0  Injury with Fall? 0  0 0 0  Risk for fall due to : No Fall Risks    No Fall Risks  Follow up Falls evaluation completed    Falls prevention discussed;Falls evaluation completed    MEDICARE RISK AT HOME:  Medicare Risk at Home Any stairs in or around the home?: Yes If so, are there any without handrails?: No Home free of loose throw rugs in walkways, pet beds, electrical cords, etc?: Yes Adequate lighting in your home to reduce risk of falls?: Yes Life alert?: No Use of a cane, walker or w/c?: Yes Grab bars in the bathroom?: Yes Shower chair or bench in shower?: Yes Elevated toilet seat or a handicapped toilet?: Yes  TIMED UP AND GO:  Was the test performed?  No  Cognitive Function: 6CIT completed        11/13/2023    3:27 PM 11/01/2022    1:09 PM  6CIT Screen  What Year? 0 points 0 points  What month? 0 points 0 points  What time? 0 points 0 points  Count back from 20 0 points 0 points  Months in reverse 0 points 0 points  Repeat phrase 0 points 0 points  Total Score 0 points 0 points    Immunizations Immunization History  Administered Date(s) Administered   Fluad Quad(high Dose 65+) 04/23/2021   Fluad Trivalent(High Dose 65+) 05/02/2023   Influenza,inj,Quad PF,6+ Mos 02/06/2018   Influenza,inj,quad, With Preservative 03/06/2018   PFIZER(Purple Top)SARS-COV-2 Vaccination 07/04/2019, 07/26/2019   PNEUMOCOCCAL CONJUGATE-20 06/14/2022   Pneumococcal Polysaccharide-23 04/23/2021   Td 01/04/2010    Screening Tests Health Maintenance  Topic Date Due   MAMMOGRAM  Never done   Zoster Vaccines- Shingrix (1 of 2) Never done   DTaP/Tdap/Td (2 - Tdap) 01/05/2020   INFLUENZA VACCINE  12/05/2023   Fecal DNA (Cologuard)  11/08/2024   Medicare Annual Wellness (AWV)  11/12/2024   Pneumococcal Vaccine: 50+ Years  Completed   DEXA SCAN  Completed   Hepatitis C Screening  Completed   Hepatitis B Vaccines  Aged Out   HPV VACCINES  Aged Out   Meningococcal B Vaccine   Aged Out   COVID-19 Vaccine  Discontinued    Health Maintenance  Health Maintenance Due  Topic Date Due   MAMMOGRAM  Never done   Zoster Vaccines- Shingrix (1 of 2) Never done   DTaP/Tdap/Td (2 - Tdap) 01/05/2020   Health Maintenance Items Addressed: Mammogram ordered, DEXA ordered  Additional Screening:  Vision Screening: Recommended annual ophthalmology exams for early detection of glaucoma and other disorders of the eye. Would you like a referral to an eye doctor? No    Dental Screening: Recommended annual dental exams for proper oral hygiene  Community Resource Referral / Chronic Care Management: CRR required this visit?  No   CCM required this visit?  No   Plan:    I have personally reviewed and noted the following in the patient's chart:   Medical and social history Use of  alcohol, tobacco or illicit drugs  Current medications and supplements including opioid prescriptions. Patient is not currently taking opioid prescriptions. Functional ability and status Nutritional status Physical activity Advanced directives List of other physicians Hospitalizations, surgeries, and ER visits in previous 12 months Vitals Screenings to include cognitive, depression, and falls Referrals and appointments  In addition, I have reviewed and discussed with patient certain preventive protocols, quality metrics, and best practice recommendations. A written personalized care plan for preventive services as well as general preventive health recommendations were provided to patient.   Michele LELON Blush, LPN   2/[id number]/7974   After Visit Summary: (MyChart) Due to this being a telephonic visit, the after visit summary with patients personalized plan was offered to patient via MyChart   Notes: Nothing significant to report at this time.

## 2023-11-13 NOTE — Patient Instructions (Addendum)
 Michele Newton , Thank you for taking time out of your busy schedule to complete your Annual Wellness Visit with me. I enjoyed our conversation and look forward to speaking with you again next year. I, as well as your care team,  appreciate your ongoing commitment to your health goals. Please review the following plan we discussed and let me know if I can assist you in the future. Your Game plan/ To Do List    Referrals: If you haven't heard from the office you've been referred to, please reach out to them at the phone provided.   Follow up Visits: Next Medicare AWV with our clinical staff: 11/19/24 @ 3:20p   Have you seen your provider in the last 6 months (3 months if uncontrolled diabetes)? 11/03/23 Next Office Visit with your provider:   Clinician Recommendations:  Aim for 30 minutes of exercise or brisk walking, 6-8 glasses of water , and 5 servings of fruits and vegetables each day.       This is a list of the screening recommended for you and due dates:  Health Maintenance  Topic Date Due   Mammogram  Never done   Zoster (Shingles) Vaccine (1 of 2) Never done   DTaP/Tdap/Td vaccine (2 - Tdap) 01/05/2020   Flu Shot  12/05/2023   Cologuard (Stool DNA test)  11/08/2024   Medicare Annual Wellness Visit  11/12/2024   Pneumococcal Vaccine for age over 71  Completed   DEXA scan (bone density measurement)  Completed   Hepatitis C Screening  Completed   Hepatitis B Vaccine  Aged Out   HPV Vaccine  Aged Out   Meningitis B Vaccine  Aged Out   COVID-19 Vaccine  Discontinued    Advanced directives: (Provided) Advance directive discussed with you today. I have provided a copy for you to complete at home and have notarized. Once this is complete, please bring a copy in to our office so we can scan it into your chart.  Advance Care Planning is important because it:  [x]  Makes sure you receive the medical care that is consistent with your values, goals, and preferences  [x]  It provides guidance to  your family and loved ones and reduces their decisional burden about whether or not they are making the right decisions based on your wishes.  Follow the link provided in your after visit summary or read over the paperwork we have mailed to you to help you started getting your Advance Directives in place. If you need assistance in completing these, please reach out to us  so that we can help you!  See attachments for Preventive Care and Fall Prevention Tips.

## 2023-11-28 ENCOUNTER — Other Ambulatory Visit: Payer: Self-pay | Admitting: Internal Medicine

## 2023-11-28 DIAGNOSIS — G43909 Migraine, unspecified, not intractable, without status migrainosus: Secondary | ICD-10-CM

## 2023-11-28 DIAGNOSIS — J4520 Mild intermittent asthma, uncomplicated: Secondary | ICD-10-CM

## 2023-12-01 NOTE — Telephone Encounter (Signed)
 Requested Prescriptions  Pending Prescriptions Disp Refills   montelukast  (SINGULAIR ) 10 MG tablet [Pharmacy Med Name: MONTELUKAST  10MG  TABLETS] 90 tablet 0    Sig: TAKE 1 TABLET(10 MG) BY MOUTH AT BEDTIME     Pulmonology:  Leukotriene Inhibitors Passed - 12/01/2023 10:44 AM      Passed - Valid encounter within last 12 months    Recent Outpatient Visits           3 months ago Seasonal allergic rhinitis due to pollen   [facility] [place], Angeline ORN, NP   5 months ago Acute non-recurrent frontal sinusitis   [facility] [place], Marsa PARAS, DO               rizatriptan  (MAXALT -MLT) 10 MG disintegrating tablet [Pharmacy Med Name: RIZATRIPTAN  ODT 10MG  TABLETS] 10 tablet 0    Sig: DISSOLVE 1 TABLET ON THE TONGUE AT ONSET OF MIGRAINE HEADACHE. MAY REPEAT IN 2 HOURS AS NEEDED. MAX 2 IN 24 HOURS     Neurology:  Migraine Therapy - Triptan Failed - 12/01/2023 10:44 AM      Failed - Last BP in normal range    BP Readings from Last 1 Encounters:  10/22/23 (!) 158/93         Passed - Valid encounter within last 12 months    Recent Outpatient Visits           3 months ago Seasonal allergic rhinitis due to pollen   [facility] [place], Angeline ORN, NP   5 months ago Acute non-recurrent frontal sinusitis   [facility] [place], Marsa PARAS, DO               PARoxetine  (PAXIL ) 40 MG tablet [Pharmacy Med Name: PAROXETINE  40MG  TABLETS] 135 tablet 0    Sig: TAKE 1 AND 1/2 TABLETS(60 MG) BY MOUTH EVERY MORNING     Psychiatry:  Antidepressants - SSRI Passed - 12/01/2023 10:44 AM      Passed - Completed PHQ-2 or PHQ-9 in the last 360 days      Passed - Valid encounter within last 6 months    Recent Outpatient Visits           3 months ago Seasonal allergic rhinitis due to pollen   [facility] [place], Angeline ORN, NP   5 months ago Acute  non-recurrent frontal sinusitis   [facility] [place], Marsa PARAS, [place]

## 2023-12-17 DIAGNOSIS — G479 Sleep disorder, unspecified: Secondary | ICD-10-CM | POA: Diagnosis not present

## 2023-12-17 DIAGNOSIS — G919 Hydrocephalus, unspecified: Secondary | ICD-10-CM | POA: Diagnosis not present

## 2023-12-17 DIAGNOSIS — R519 Headache, unspecified: Secondary | ICD-10-CM | POA: Diagnosis not present

## 2023-12-17 DIAGNOSIS — M542 Cervicalgia: Secondary | ICD-10-CM | POA: Diagnosis not present

## 2023-12-19 ENCOUNTER — Other Ambulatory Visit: Payer: Self-pay | Admitting: Internal Medicine

## 2023-12-19 DIAGNOSIS — K219 Gastro-esophageal reflux disease without esophagitis: Secondary | ICD-10-CM

## 2023-12-22 ENCOUNTER — Other Ambulatory Visit: Payer: Self-pay

## 2023-12-22 DIAGNOSIS — K219 Gastro-esophageal reflux disease without esophagitis: Secondary | ICD-10-CM

## 2023-12-22 MED ORDER — PANTOPRAZOLE SODIUM 40 MG PO TBEC: 40.0000 mg | DELAYED_RELEASE_TABLET | Freq: Two times a day (BID) | ORAL | 0 refills | Status: DC

## 2023-12-22 NOTE — Telephone Encounter (Signed)
 Requested Prescriptions  Refused Prescriptions Disp Refills   pantoprazole  (PROTONIX ) 40 MG tablet [Pharmacy Med Name: PANTOPRAZOLE  40MG  TABLETS] 180 tablet 0    Sig: TAKE 1 TABLET(40 MG) BY MOUTH TWICE DAILY     Gastroenterology: Proton Pump Inhibitors Passed - 12/22/2023  5:32 PM      Passed - Valid encounter within last 12 months    Recent Outpatient Visits           3 months ago Seasonal allergic rhinitis due to pollen   [facility] [place], Angeline ORN, NP   6 months ago Acute non-recurrent frontal sinusitis   [facility], Marsa PARAS, [place]

## 2023-12-29 ENCOUNTER — Encounter: Payer: Self-pay | Admitting: Neurosurgery

## 2023-12-29 ENCOUNTER — Ambulatory Visit (INDEPENDENT_AMBULATORY_CARE_PROVIDER_SITE_OTHER): Admitting: Internal Medicine

## 2023-12-29 ENCOUNTER — Encounter: Payer: Self-pay | Admitting: Internal Medicine

## 2023-12-29 VITALS — BP 130/84 | HR 78 | Ht 66.0 in | Wt 217.2 lb

## 2023-12-29 DIAGNOSIS — J4540 Moderate persistent asthma, uncomplicated: Secondary | ICD-10-CM

## 2023-12-29 DIAGNOSIS — J069 Acute upper respiratory infection, unspecified: Secondary | ICD-10-CM

## 2023-12-29 DIAGNOSIS — J324 Chronic pansinusitis: Secondary | ICD-10-CM | POA: Diagnosis not present

## 2023-12-29 DIAGNOSIS — H7203 Central perforation of tympanic membrane, bilateral: Secondary | ICD-10-CM | POA: Diagnosis not present

## 2023-12-29 LAB — POCT RAPID STREP A (OFFICE): Rapid Strep A Screen: NEGATIVE

## 2023-12-29 LAB — POC COVID19 BINAXNOW: SARS Coronavirus 2 Ag: NEGATIVE

## 2023-12-29 MED ORDER — METHYLPREDNISOLONE ACETATE 80 MG/ML IJ SUSP
80.0000 mg | Freq: Once | INTRAMUSCULAR | Status: AC
  Administered 2023-12-29: 80 mg via INTRAMUSCULAR

## 2023-12-29 NOTE — Patient Instructions (Signed)

## 2023-12-29 NOTE — Progress Notes (Addendum)
 Subjective:    Patient ID: Tuwana Kapaun, female    DOB: [birth year], 68 y.o.   MRN: [id number]  HPI     Discussed the use of AI scribe software for clinical note transcription with the patient, who gave verbal consent to proceed.  Tashay Bozich is a 68 year old female with chronic sinus issues and asthma who presents with worsening ear and sinus symptoms.  Since March, she has experienced worsening ear and sinus symptoms which have worsened over the weekend. There is yellow fluid drainage from her left ear, significant enough to run down her face, managed with cotton balls. She has consulted two ENT specialists and reports that she will return in October for follow-up regarding her eardrum.  She reports a sore throat, hoarseness, and sinus congestion with clear nasal discharge. She experienced a fever of 101F yesterday, along with sinus pressure. No cough, shortness of breath, nausea, or vomiting.   Her current medications include symbicort  and albuterol . She was seen at urgent care on 7/20, prescribed oflaxacin and augmentin . She has a history of environmental allergies contributing to her chronic sinus issues, she had a CT sinus 10/2023.  She feels generally unwell, experiencing sweats and chills, and is concerned about her ability to work due to her symptoms.      Review of Systems   Past Medical History:  Diagnosis Date   Anxiety    Arthritis    Asthma    Depression    Frequent headaches    GERD (gastroesophageal reflux disease)    Hydrocephalus (HCC) 1995   Hypertension    Migraine    PONV (postoperative nausea and vomiting)    Psoriatic arthritis (HCC)     Current Outpatient Medications  Medication Sig Dispense Refill   budesonide -formoterol  (SYMBICORT ) 160-4.5 MCG/ACT inhaler Inhale 2 puffs into the lungs 2 (two) times daily. 1 each 5   docusate sodium  (COLACE) 100 MG capsule Take 1 capsule (100 mg total) by mouth 2 (two) times daily. 10 capsule 0   Evolocumab  (REPATHA   SURECLICK) 140 MG/ML SOAJ Inject 140 mg into the skin every 14 (fourteen) days. 6 mL 1   montelukast  (SINGULAIR ) 10 MG tablet TAKE 1 TABLET(10 MG) BY MOUTH AT BEDTIME 90 tablet 0   Nebulizers (COMPRESSOR/NEBULIZER) MISC 1 Units by Does not apply route every 4 (four) hours as needed. 1 each 0   nortriptyline (PAMELOR) 10 MG capsule Take 10 mg by mouth.     ofloxacin (OCUFLOX) 0.3 % ophthalmic solution INSTILL 4 DROPS IN LEFT EAR TWICE DAILY FOR 7 DAYS     pantoprazole  (PROTONIX ) 40 MG tablet Take 1 tablet (40 mg total) by mouth 2 (two) times daily. 180 tablet 0   PARoxetine  (PAXIL ) 40 MG tablet TAKE 1 AND 1/2 TABLETS(60 MG) BY MOUTH EVERY MORNING 135 tablet 0   QUEtiapine  (SEROQUEL ) 200 MG tablet TAKE 1 TABLET(200 MG) BY MOUTH AT BEDTIME 90 tablet 0   rizatriptan  (MAXALT -MLT) 10 MG disintegrating tablet DISSOLVE 1 TABLET ON THE TONGUE AT ONSET OF MIGRAINE HEADACHE. MAY REPEAT IN 2 HOURS AS NEEDED. MAX 2 IN 24 HOURS 10 tablet 0   traZODone  (DESYREL ) 50 MG tablet TAKE 1/2 TO 1 TABLET(25 TO 50 MG) BY MOUTH AT BEDTIME AS NEEDED FOR SLEEP 90 tablet 1   verapamil  (CALAN -SR) 180 MG CR tablet Take 1 tablet (180 mg total) by mouth daily. 90 tablet 1   No current facility-administered medications for this visit.    Allergies  Allergen Reactions   Amitriptyline  Other (See Comments)    hallucinations   Effexor [Venlafaxine] Other (See Comments)    Amnesia, hospitalized x 2 weeks   Haldol [Haloperidol] Other (See Comments)    Paralyzed vocal cords     Family History  Problem Relation Age of Onset   Arthritis Mother    Hyperlipidemia Mother    Heart disease Mother 39       Died of heart failure   Hypertension Mother    Diabetes Mother    Depression Mother    Arthritis Father    Cancer Father        prostate cancer   Diabetes Brother    Breast cancer Maternal Grandmother    Colon cancer Neg Hx     Social History   Socioeconomic History   Marital status: Married    Spouse name: Arley    Number of children: 2   Years of education: 14   Highest education level: Some college, no degree  Occupational History    Employer: OTHER  Tobacco Use   Smoking status: Never   Smokeless tobacco: Never  Vaping Use   Vaping status: Never Used  Substance and Sexual Activity   Alcohol use: No   Drug use: No   Sexual activity: Not Currently    Birth control/protection: Surgical  Other Topics Concern   Not on file  Social History Narrative   Romell lives at home with her husband, Arley, of 5 months and a cat named, Puff. She has 2 sons from a previous marriage. She has 4 grandchildren.    Social Drivers of Corporate investment banker Strain: Low Risk  (11/13/2023)   Overall Financial Resource Strain (CARDIA)    Difficulty of Paying Living Expenses: Not hard at all  Recent Concern: Financial Resource Strain - Medium Risk (11/12/2023)   Overall Financial Resource Strain (CARDIA)    Difficulty of Paying Living Expenses: Somewhat hard  Food Insecurity: No Food Insecurity (11/13/2023)   Hunger Vital Sign    Worried About Running Out of Food in the Last Year: Never true    Ran Out of Food in the Last Year: Never true  Transportation Needs: No Transportation Needs (11/13/2023)   PRAPARE - Administrator, Civil Service (Medical): No    Lack of Transportation (Non-Medical): No  Physical Activity: Inactive (11/13/2023)   Exercise Vital Sign    Days of Exercise per Week: 0 days    Minutes of Exercise per Session: 0 min  Stress: No Stress Concern Present (11/13/2023)   [employer] of Occupational Health - Occupational Stress Questionnaire    Feeling of Stress: Not at all  Social Connections: Socially Integrated (11/13/2023)   Social Connection and Isolation Panel    Frequency of Communication with Friends and Family: More than three times a week    Frequency of Social Gatherings with Friends and Family: More than three times a week    Attends Religious Services: More than 4  times per year    Active Member of [employer] or Organizations: Yes    Attends Engineer, structural: More than 4 times per year    Marital Status: Married  Catering manager Violence: Not At Risk (11/13/2023)   Humiliation, Afraid, Rape, and Kick questionnaire    Fear of Current or Ex-Partner: No    Emotionally Abused: No    Physically Abused: No    Sexually Abused: No     Constitutional: Pt reports headache,  fever. Denies chills, malaise, fatigue,  or abrupt weight changes.  HEENT: Pt reports sinus pressure, runny nose, nasal congestion, left ear pain, hoarseness and sore throat. Denies eye pain, eye redness, ringing in the ears, wax buildup, bloody nose. Respiratory: Denies difficulty breathing, cough or shortness of breath. Cardiovascular: Denies chest pain, chest tightness, palpitations or swelling in the hands or feet.  Gastrointestinal: Denies abdominal pain, bloating, constipation, diarrhea or blood in the stool.  Musculoskeletal: Denies decrease in range of motion, difficulty with gait, muscle pain or joint pain and swelling.    No other specific complaints in a complete review of systems (except as listed in HPI above).      Objective:   Physical Exam BP 130/84 (BP Location: Right Arm, Patient Position: Sitting, Cuff Size: Normal)   Pulse 78   Ht 5' 6 (1.676 m)   Wt 217 lb 3.2 oz (98.5 kg)   SpO2 99%   BMI 35.06 kg/m    Wt Readings from Last 3 Encounters:  11/13/23 214 lb (97.1 kg)  08/25/23 214 lb 9.6 oz (97.3 kg)  06/10/23 211 lb (95.7 kg)    General: Appears her stated age, obese, in NAD. Skin: Warm, dry and intact.  HEENT: Head: normal shape and size, no sinus tenderness noted; Eyes: sclera white, no icterus, conjunctiva pink, PERRLA and EOMs intact; Ears: Tm's gray with bilateral perforations; Nose: mucosa pink and moist, septum midline; Throat/Mouth: Teeth present, mucosa pink and moist, + PND, no exudate, lesions or ulcerations noted.  Neck: No  adenopathy noted. Cardiovascular: Normal rate and rhythm. S1,S2 noted.  No murmur, rubs or gallops noted.  Pulmonary/Chest: Normal effort and positive vesicular breath sounds. No respiratory distress. No wheezes, rales or ronchi noted.  Neurological: Alert and oriented.   BMET    Component Value Date/Time   NA 137 02/24/2023 1219   K 4.8 02/24/2023 1219   CL 103 02/24/2023 1219   CO2 27 02/24/2023 1219   GLUCOSE 102 (H) 02/24/2023 1219   BUN 14 02/24/2023 1219   CREATININE 0.94 02/24/2023 1219   CREATININE 0.85 05/01/2022 1046   CALCIUM  9.0 02/24/2023 1219   GFRNONAA >60 02/24/2023 1219   GFRNONAA 69 10/09/2017 1024   GFRAA >60 11/20/2018 0408   GFRAA 79 10/09/2017 1024    Lipid Panel     Component Value Date/Time   CHOL 307 (H) 05/02/2023 0936   TRIG 242 (H) 05/02/2023 0936   HDL 46 (L) 05/02/2023 0936   CHOLHDL 6.7 (H) 05/02/2023 0936   VLDL 33.0 02/20/2017 0841   LDLCALC 216 (H) 05/02/2023 0936    CBC    Component Value Date/Time   WBC 7.6 02/24/2023 1219   RBC 5.30 (H) 02/24/2023 1219   HGB 14.6 02/24/2023 1219   HCT 45.3 02/24/2023 1219   PLT 410 (H) 02/24/2023 1219   MCV 85.5 02/24/2023 1219   MCH 27.5 02/24/2023 1219   MCHC 32.2 02/24/2023 1219   RDW 13.3 02/24/2023 1219   LYMPHSABS 2.2 07/18/2020 1054   MONOABS 0.8 07/18/2020 1054   EOSABS 0.1 07/18/2020 1054   BASOSABS 0.1 07/18/2020 1054    Hgb A1C Lab Results  Component Value Date   HGBA1C 5.9 (H) 05/02/2023            Assessment and Plan    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 ENT follow-up scheduled for October to assess healing and potential surgical intervention. - Hold off on ear drops. - Avoid water  in  the ear; use ear plugs or cotton balls during showers. - Call ENT to attempt to schedule an earlier appointment.  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 Differential includes viral infection or exacerbation of environmental allergies. Recent CT scan performed. Strep and COVID tests negative. Fluctuating fever and sweats suggest possible viral etiology. Concerns about overuse of antibiotics and steroids due to prediabetes and potential resistance. - Administer 80 mg of depo medrol . - Continue flonase  and montelukast . - Start xyzal for allergy management. - Consider a work note if needed for rest.  Asthma Asthma managed with Symbicort  and albuterol . Compliant with montelukast  but not Xyzal. No current respiratory distress or shortness of breath. - Continue symbicort  and albuterol  as needed. - Ensure compliance with montelukast . - Start xyzal for allergy management.     Schedule an appt for your annual exam Angeline Laura, NP

## 2023-12-29 NOTE — Addendum Note (Signed)
 Addended by: ZELIA GAUZE D on: 12/29/2023 12:00 PM   Modules accepted: Orders

## 2024-01-06 ENCOUNTER — Encounter: Payer: Self-pay | Admitting: Internal Medicine

## 2024-01-06 ENCOUNTER — Other Ambulatory Visit: Payer: Self-pay | Admitting: Internal Medicine

## 2024-01-06 NOTE — Telephone Encounter (Signed)
 Requested Prescriptions  Pending Prescriptions Disp Refills   verapamil  (CALAN -SR) 180 MG CR tablet [Pharmacy Med Name: VERAPAMIL  ER 180MG  TABLETS] 90 tablet 0    Sig: TAKE 1 TABLET(180 MG) BY MOUTH DAILY     Cardiovascular: Calcium  Channel Blockers 3 Failed - 01/06/2024  3:44 PM      Failed - ALT in normal range and within 360 days    ALT  Date Value Ref Range Status  05/01/2022 30 (H) 6 - 29 U/L Final         Failed - AST in normal range and within 360 days    AST  Date Value Ref Range Status  05/01/2022 22 10 - 35 U/L Final         Passed - Cr in normal range and within 360 days    Creat  Date Value Ref Range Status  05/01/2022 0.85 0.50 - 1.05 mg/dL Final   Creatinine, Ser  Date Value Ref Range Status  02/24/2023 0.94 0.44 - 1.00 mg/dL Final         Passed - Last BP in normal range    BP Readings from Last 1 Encounters:  12/29/23 130/84         Passed - Last Heart Rate in normal range    Pulse Readings from Last 1 Encounters:  12/29/23 78         Passed - Valid encounter within last 6 months    Recent Outpatient Visits           1 week ago Viral URI   [facility] [place], Angeline ORN, NP   4 months ago Seasonal allergic rhinitis due to pollen   [facility] [place], Angeline ORN, NP   7 months ago Acute non-recurrent frontal sinusitis    [facility] [place], Marsa PARAS, [place]

## 2024-01-07 ENCOUNTER — Encounter: Admitting: Internal Medicine

## 2024-01-13 ENCOUNTER — Encounter: Payer: Self-pay | Admitting: Internal Medicine

## 2024-01-13 ENCOUNTER — Ambulatory Visit (INDEPENDENT_AMBULATORY_CARE_PROVIDER_SITE_OTHER): Admitting: Internal Medicine

## 2024-01-13 ENCOUNTER — Ambulatory Visit: Payer: Self-pay | Admitting: Internal Medicine

## 2024-01-13 VITALS — BP 128/78 | HR 72 | Ht 66.0 in | Wt 212.0 lb

## 2024-01-13 DIAGNOSIS — E6609 Other obesity due to excess calories: Secondary | ICD-10-CM

## 2024-01-13 DIAGNOSIS — R300 Dysuria: Secondary | ICD-10-CM

## 2024-01-13 DIAGNOSIS — E66811 Obesity, class 1: Secondary | ICD-10-CM

## 2024-01-13 DIAGNOSIS — E782 Mixed hyperlipidemia: Secondary | ICD-10-CM | POA: Diagnosis not present

## 2024-01-13 DIAGNOSIS — R739 Hyperglycemia, unspecified: Secondary | ICD-10-CM

## 2024-01-13 DIAGNOSIS — Z6833 Body mass index (BMI) 33.0-33.9, adult: Secondary | ICD-10-CM

## 2024-01-13 DIAGNOSIS — Z0001 Encounter for general adult medical examination with abnormal findings: Secondary | ICD-10-CM

## 2024-01-13 LAB — POCT URINE DIPSTICK
Bilirubin, UA: NEGATIVE
Glucose, UA: NEGATIVE mg/dL
Ketones, POC UA: NEGATIVE mg/dL
Nitrite, UA: NEGATIVE
POC PROTEIN,UA: NEGATIVE
Spec Grav, UA: 1.015 (ref 1.010–1.025)
Urobilinogen, UA: 0.2 U/dL
pH, UA: 6 (ref 5.0–8.0)

## 2024-01-13 MED ORDER — QUETIAPINE FUMARATE 200 MG PO TABS: 200.0000 mg | ORAL_TABLET | Freq: Every evening | ORAL | 1 refills | Status: AC

## 2024-01-13 MED ORDER — NITROFURANTOIN MONOHYD MACRO 100 MG PO CAPS: 100.0000 mg | ORAL_CAPSULE | Freq: Two times a day (BID) | ORAL | 0 refills | Status: DC

## 2024-01-13 MED ORDER — TRAZODONE HCL 50 MG PO TABS: 50.0000 mg | ORAL_TABLET | Freq: Every day | ORAL | 1 refills | Status: AC

## 2024-01-13 NOTE — Addendum Note (Signed)
 Addended by: ANTONETTE ANGELINE ORN on: 01/13/2024 09:08 AM   Modules accepted: Orders

## 2024-01-13 NOTE — Assessment & Plan Note (Signed)
 Encourage diet and exercise for weight loss

## 2024-01-13 NOTE — Addendum Note (Signed)
 Addended by: ZELIA GAUZE D on: 01/13/2024 09:03 AM   Modules accepted: Orders

## 2024-01-13 NOTE — Patient Instructions (Signed)
 Health Maintenance for Postmenopausal Women Menopause is a normal process in which your ability to get pregnant comes to an end. This process happens slowly over many months or years, usually between the ages of 76 and 38. Menopause is complete when you have missed your menstrual period for 12 months. It is important to talk with your health care provider about some of the most common conditions that affect women after menopause (postmenopausal women). These include heart disease, cancer, and bone loss (osteoporosis). Adopting a healthy lifestyle and getting preventive care can help to promote your health and wellness. The actions you take can also lower your chances of developing some of these common conditions. What are the signs and symptoms of menopause? During menopause, you may have the following symptoms: Hot flashes. These can be moderate or severe. Night sweats. Decrease in sex drive. Mood swings. Headaches. Tiredness (fatigue). Irritability. Memory problems. Problems falling asleep or staying asleep. Talk with your health care provider about treatment options for your symptoms. Do I need hormone replacement therapy? Hormone replacement therapy is effective in treating symptoms that are caused by menopause, such as hot flashes and night sweats. Hormone replacement carries certain risks, especially as you become older. If you are thinking about using estrogen or estrogen with progestin, discuss the benefits and risks with your health care provider. How can I reduce my risk for heart disease and stroke? The risk of heart disease, heart attack, and stroke increases as you age. One of the causes may be a change in the body's hormones during menopause. This can affect how your body uses dietary fats, triglycerides, and cholesterol. Heart attack and stroke are medical emergencies. There are many things that you can do to help prevent heart disease and stroke. Watch your blood pressure High  blood pressure causes heart disease and increases the risk of stroke. This is more likely to develop in people who have high blood pressure readings or are overweight. Have your blood pressure checked: Every 3-5 years if you are 32-23 years of age. Every year if you are 31 years old or older. Eat a healthy diet  Eat a diet that includes plenty of vegetables, fruits, low-fat dairy products, and lean protein. Do not eat a lot of foods that are high in solid fats, added sugars, or sodium. Get regular exercise Get regular exercise. This is one of the most important things you can do for your health. Most adults should: Try to exercise for at least 150 minutes each week. The exercise should increase your heart rate and make you sweat (moderate-intensity exercise). Try to do strengthening exercises at least twice each week. Do these in addition to the moderate-intensity exercise. Spend less time sitting. Even light physical activity can be beneficial. Other tips Work with your health care provider to achieve or maintain a healthy weight. Do not use any products that contain nicotine or tobacco. These products include cigarettes, chewing tobacco, and vaping devices, such as e-cigarettes. If you need help quitting, ask your health care provider. Know your numbers. Ask your health care provider to check your cholesterol and your blood sugar (glucose). Continue to have your blood tested as directed by your health care provider. Do I need screening for cancer? Depending on your health history and family history, you may need to have cancer screenings at different stages of your life. This may include screening for: Breast cancer. Cervical cancer. Lung cancer. Colorectal cancer. What is my risk for osteoporosis? After menopause, you may be  at increased risk for osteoporosis. Osteoporosis is a condition in which bone destruction happens more quickly than new bone creation. To help prevent osteoporosis or  the bone fractures that can happen because of osteoporosis, you may take the following actions: If you are 24-54 years old, get at least 1,000 mg of calcium and at least 600 international units (IU) of vitamin D  per day. If you are older than age 75 but younger than age 30, get at least 1,200 mg of calcium and at least 600 international units (IU) of vitamin D  per day. If you are older than age 8, get at least 1,200 mg of calcium and at least 800 international units (IU) of vitamin D  per day. Smoking and drinking excessive alcohol increase the risk of osteoporosis. Eat foods that are rich in calcium and vitamin D , and do weight-bearing exercises several times each week as directed by your health care provider. How does menopause affect my mental health? Depression may occur at any age, but it is more common as you become older. Common symptoms of depression include: Feeling depressed. Changes in sleep patterns. Changes in appetite or eating patterns. Feeling an overall lack of motivation or enjoyment of activities that you previously enjoyed. Frequent crying spells. Talk with your health care provider if you think that you are experiencing any of these symptoms. General instructions See your health care provider for regular wellness exams and vaccines. This may include: Scheduling regular health, dental, and eye exams. Getting and maintaining your vaccines. These include: Influenza vaccine. Get this vaccine each year before the flu season begins. Pneumonia vaccine. Shingles vaccine. Tetanus, diphtheria, and pertussis (Tdap) booster vaccine. Your health care provider may also recommend other immunizations. Tell your health care provider if you have ever been abused or do not feel safe at home. Summary Menopause is a normal process in which your ability to get pregnant comes to an end. This condition causes hot flashes, night sweats, decreased interest in sex, mood swings, headaches, or lack  of sleep. Treatment for this condition may include hormone replacement therapy. Take actions to keep yourself healthy, including exercising regularly, eating a healthy diet, watching your weight, and checking your blood pressure and blood sugar levels. Get screened for cancer and depression. Make sure that you are up to date with all your vaccines. This information is not intended to replace advice given to you by your health care provider. Make sure you discuss any questions you have with your health care provider. Document Revised: 09/11/2020 Document Reviewed: 09/11/2020 Elsevier Patient Education  2024 [employer].

## 2024-01-13 NOTE — Progress Notes (Addendum)
 Subjective:    Patient ID: Michele Newton, female    DOB: [birth year], 68 y.o.   MRN: [id number]  HPI  Patient presents to clinic today for her annual exam.  Flu: 04/2023 Tetanus: 01/2010 COVID: Pfizer x 2 Pneumovax: 04/2021 Prevnar: 06/2022 Shingrix: Never Pap smear: no longer screening Mammogram: > 2 years ago Bone density: never Colon Screening: 11/2021, Cologuard Vision screening: annually Dentist: biannually  Diet: She does eat meat. She consumes fruits and veggies. She does eat some fried foods. She drinks mostly water . Exercise: None  Review of Systems     Past Medical History:  Diagnosis Date   Allergy    Anxiety    Arthritis    Asthma    Depression    Frequent headaches    GERD (gastroesophageal reflux disease)    Hydrocephalus (HCC) 1995   Hypertension    Migraine    PONV (postoperative nausea and vomiting)    Psoriatic arthritis (HCC)     Current Outpatient Medications  Medication Sig Dispense Refill   albuterol  (VENTOLIN  HFA) 108 (90 Base) MCG/ACT inhaler INL 1 TO 2 PFS INTO THE LUNGS Q 6 H PRF SOB     budesonide -formoterol  (SYMBICORT ) 160-4.5 MCG/ACT inhaler Inhale 2 puffs into the lungs 2 (two) times daily. 1 each 5   butalbital-acetaminophen -caffeine (FIORICET) 50-325-40 MG tablet Take 1 tab at headache onset. Take another tablet in 2 hours if needed. Take no more than 2 tablets in a 24 hour period.     docusate sodium  (COLACE) 100 MG capsule Take 1 capsule (100 mg total) by mouth 2 (two) times daily. 10 capsule 0   fluticasone  (FLONASE ) 50 MCG/ACT nasal spray SMARTSIG:1-2 Spray(s) Both Nares Daily     levocetirizine (XYZAL) 5 MG tablet SMARTSIG:1 Tablet(s) By Mouth Every Evening     montelukast  (SINGULAIR ) 10 MG tablet TAKE 1 TABLET(10 MG) BY MOUTH AT BEDTIME 90 tablet 0   Nebulizers (COMPRESSOR/NEBULIZER) MISC 1 Units by Does not apply route every 4 (four) hours as needed. 1 each 0   nortriptyline (PAMELOR) 10 MG capsule Take 10 mg by mouth.      ofloxacin (OCUFLOX) 0.3 % ophthalmic solution INSTILL 4 DROPS IN LEFT EAR TWICE DAILY FOR 7 DAYS     pantoprazole  (PROTONIX ) 40 MG tablet Take 1 tablet (40 mg total) by mouth 2 (two) times daily. 180 tablet 0   PARoxetine  (PAXIL ) 40 MG tablet TAKE 1 AND 1/2 TABLETS(60 MG) BY MOUTH EVERY MORNING 135 tablet 0   QUEtiapine  (SEROQUEL ) 200 MG tablet TAKE 1 TABLET(200 MG) BY MOUTH AT BEDTIME 90 tablet 0   rizatriptan  (MAXALT -MLT) 10 MG disintegrating tablet DISSOLVE 1 TABLET ON THE TONGUE AT ONSET OF MIGRAINE HEADACHE. MAY REPEAT IN 2 HOURS AS NEEDED. MAX 2 IN 24 HOURS 10 tablet 0   traZODone  (DESYREL ) 50 MG tablet TAKE 1/2 TO 1 TABLET(25 TO 50 MG) BY MOUTH AT BEDTIME AS NEEDED FOR SLEEP 90 tablet 1   verapamil  (CALAN -SR) 180 MG CR tablet TAKE 1 TABLET(180 MG) BY MOUTH DAILY 90 tablet 0   No current facility-administered medications for this visit.    Allergies  Allergen Reactions   Amitriptyline Other (See Comments)    hallucinations   Effexor [Venlafaxine] Other (See Comments)    Amnesia, hospitalized x 2 weeks   Haldol [Haloperidol] Other (See Comments)    Paralyzed vocal cords     Family History  Problem Relation Age of Onset   Arthritis Mother    Hyperlipidemia Mother  Heart disease Mother 33       Died of heart failure   Hypertension Mother    Diabetes Mother    Depression Mother    Obesity Mother    Arthritis Father    Cancer Father        prostate cancer   Diabetes Brother    Breast cancer Maternal Grandmother    Diabetes Sister    Colon cancer Neg Hx     Social History   Socioeconomic History   Marital status: Married    Spouse name: Arley   Number of children: 2   Years of education: 14   Highest education level: Some college, no degree  Occupational History    Employer: OTHER  Tobacco Use   Smoking status: Never   Smokeless tobacco: Never  Vaping Use   Vaping status: Never Used  Substance and Sexual Activity   Alcohol use: No   Drug use: No   Sexual  activity: Not Currently    Birth control/protection: Post-menopausal  Other Topics Concern   Not on file  Social History Narrative   Charlyne lives at home with her husband, Arley, of 5 months and a cat named, Puff. She has 2 sons from a previous marriage. She has 4 grandchildren.    Social Drivers of Corporate investment banker Strain: Low Risk  (11/13/2023)   Overall Financial Resource Strain (CARDIA)    Difficulty of Paying Living Expenses: Not hard at all  Recent Concern: Financial Resource Strain - Medium Risk (11/12/2023)   Overall Financial Resource Strain (CARDIA)    Difficulty of Paying Living Expenses: Somewhat hard  Food Insecurity: No Food Insecurity (11/13/2023)   Hunger Vital Sign    Worried About Running Out of Food in the Last Year: Never true    Ran Out of Food in the Last Year: Never true  Transportation Needs: No Transportation Needs (11/13/2023)   PRAPARE - Administrator, Civil Service (Medical): No    Lack of Transportation (Non-Medical): No  Physical Activity: Inactive (11/13/2023)   Exercise Vital Sign    Days of Exercise per Week: 0 days    Minutes of Exercise per Session: 0 min  Stress: No Stress Concern Present (11/13/2023)   [employer] of Occupational Health - Occupational Stress Questionnaire    Feeling of Stress: Not at all  Social Connections: Socially Integrated (11/13/2023)   Social Connection and Isolation Panel    Frequency of Communication with Friends and Family: More than three times a week    Frequency of Social Gatherings with Friends and Family: More than three times a week    Attends Religious Services: More than 4 times per year    Active Member of [employer] or Organizations: Yes    Attends Engineer, structural: More than 4 times per year    Marital Status: Married  Catering manager Violence: Not At Risk (11/13/2023)   Humiliation, Afraid, Rape, and Kick questionnaire    Fear of Current or Ex-Partner: No    Emotionally  Abused: No    Physically Abused: No    Sexually Abused: No     Constitutional: Denies fever, malaise, fatigue, headache or abrupt weight changes.  HEENT: Denies eye pain, eye redness, ear pain, ringing in the ears, wax buildup, runny nose, nasal congestion, bloody nose, or sore throat. Respiratory: Denies difficulty breathing, shortness of breath, cough or sputum production.   Cardiovascular: Denies chest pain, chest tightness, palpitations or swelling in the hands  or feet.  Gastrointestinal: Patient reports alternating constipation and diarrhea.  Denies abdominal pain, bloating, or blood in the stool.  GU: Pt reports burning with urination. Denies urgency, frequency, pain with urination, blood in urine, odor or discharge. Musculoskeletal: Patient reports chronic back pain, left hip pain.  Denies decrease in range of motion, difficulty with gait, muscle pain or joint swelling.  Skin: Denies redness, rashes, lesions or ulcercations.  Neurological: Patient reports insomnia.  Denies dizziness, difficulty with memory, difficulty with speech or problems with balance and coordination.  Psych: Patient has a history of anxiety and depression.  Denies SI/HI.  No other specific complaints in a complete review of systems (except as listed in HPI above).  Objective:   Physical Exam  BP 128/78 (BP Location: Left Arm, Patient Position: Sitting, Cuff Size: Normal)   Pulse 72   Ht 5' 6 (1.676 m)   Wt 212 lb (96.2 kg)   SpO2 99%   BMI 34.22 kg/m   Wt Readings from Last 3 Encounters:  01/13/24 212 lb (96.2 kg)  12/29/23 217 lb 3.2 oz (98.5 kg)  11/13/23 214 lb (97.1 kg)    General: Appears her stated age, obese, in NAD. Skin: Warm, dry and intact.  HEENT: Head: normal shape and size; Eyes: sclera white, no icterus, conjunctiva pink, PERRLA and EOMs intact;  Neck:  Neck supple, trachea midline. No masses, lumps or thyromegaly present.  Shunt palpable on the left side of the  neck. Cardiovascular: Normal rate and rhythm. S1,S2 noted.  No murmur, rubs or gallops noted. No JVD or BLE edema. No carotid bruits noted. Pulmonary/Chest: Normal effort and positive vesicular breath sounds. No respiratory distress. No wheezes, rales or ronchi noted.  Abdomen: Normal bowel sounds.  Musculoskeletal: Strength 5/5 BUE/BLE.  No difficulty with gait.  Neurological: Alert and oriented. Cranial nerves II-XII grossly intact. Coordination normal.  Psychiatric: Mood and affect normal. Behavior is normal. Judgment and thought content normal.     BMET    Component Value Date/Time   NA 137 02/24/2023 1219   K 4.8 02/24/2023 1219   CL 103 02/24/2023 1219   CO2 27 02/24/2023 1219   GLUCOSE 102 (H) 02/24/2023 1219   BUN 14 02/24/2023 1219   CREATININE 0.94 02/24/2023 1219   CREATININE 0.85 05/01/2022 1046   CALCIUM  9.0 02/24/2023 1219   GFRNONAA >60 02/24/2023 1219   GFRNONAA 69 10/09/2017 1024   GFRAA >60 11/20/2018 0408   GFRAA 79 10/09/2017 1024    Lipid Panel     Component Value Date/Time   CHOL 307 (H) 05/02/2023 0936   TRIG 242 (H) 05/02/2023 0936   HDL 46 (L) 05/02/2023 0936   CHOLHDL 6.7 (H) 05/02/2023 0936   VLDL 33.0 02/20/2017 0841   LDLCALC 216 (H) 05/02/2023 0936    CBC    Component Value Date/Time   WBC 7.6 02/24/2023 1219   RBC 5.30 (H) 02/24/2023 1219   HGB 14.6 02/24/2023 1219   HCT 45.3 02/24/2023 1219   PLT 410 (H) 02/24/2023 1219   MCV 85.5 02/24/2023 1219   MCH 27.5 02/24/2023 1219   MCHC 32.2 02/24/2023 1219   RDW 13.3 02/24/2023 1219   LYMPHSABS 2.2 07/18/2020 1054   MONOABS 0.8 07/18/2020 1054   EOSABS 0.1 07/18/2020 1054   BASOSABS 0.1 07/18/2020 1054    Hgb A1C Lab Results  Component Value Date   HGBA1C 5.9 (H) 05/02/2023           Assessment & Plan:   Preventative  health maintenance:  Encouraged her to get a flu shot in the fall She declines tetanus for financial reasons, advised her if she gets bit or cut to go get  this done COVID-vaccine UTD Pneumovax and Prevnar UTD Discussed Shingrix vaccine, she will check coverage with her insurance company and schedule visit she would like to have this done She no longer needs screening for cervical cancer given her age She declines mammogram and bone density Colon screening UTD Encouraged her to consume a balanced diet and exercise regimen Advised her to see an eye doctor and dentist annually We will check CBC, c-Met, lipid, A1c  Burning with urination:  Push fluids Urinalysis: trace blood, small leuks Will send urine culture RX for Macrobid  100 mg BID x 5 days  RTC in 6 months, follow-up chronic conditions Angeline Laura, NP

## 2024-01-14 LAB — COMPREHENSIVE METABOLIC PANEL WITH GFR
AG Ratio: 1.7 (calc) (ref 1.0–2.5)
ALT: 26 U/L (ref 6–29)
AST: 15 U/L (ref 10–35)
Albumin: 4.3 g/dL (ref 3.6–5.1)
Alkaline phosphatase (APISO): 115 U/L (ref 37–153)
BUN: 21 mg/dL (ref 7–25)
CO2: 28 mmol/L (ref 20–32)
Calcium: 10 mg/dL (ref 8.6–10.4)
Chloride: 101 mmol/L (ref 98–110)
Creat: 0.96 mg/dL (ref 0.50–1.05)
Globulin: 2.5 g/dL (ref 1.9–3.7)
Glucose, Bld: 95 mg/dL (ref 65–99)
Potassium: 4.6 mmol/L (ref 3.5–5.3)
Sodium: 139 mmol/L (ref 135–146)
Total Bilirubin: 0.5 mg/dL (ref 0.2–1.2)
Total Protein: 6.8 g/dL (ref 6.1–8.1)
eGFR: 64 mL/min/1.73m2 (ref >=60)

## 2024-01-14 LAB — CBC
HCT: 46.3 % — ABNORMAL HIGH (ref 35.0–45.0)
Hemoglobin: 15.3 g/dL (ref 11.7–15.5)
MCH: 28.7 pg (ref 27.0–33.0)
MCHC: 33 g/dL (ref 32.0–36.0)
MCV: 86.9 fL (ref 80.0–100.0)
MPV: 9.4 fL (ref 7.5–12.5)
Platelets: 458 Thousand/uL — ABNORMAL HIGH (ref 140–400)
RBC: 5.33 Million/uL — ABNORMAL HIGH (ref 3.80–5.10)
RDW: 12.8 % (ref 11.0–15.0)
WBC: 10.6 Thousand/uL (ref 3.8–10.8)

## 2024-01-14 LAB — LIPID PANEL
Cholesterol: 272 mg/dL — ABNORMAL HIGH (ref <200)
HDL: 53 mg/dL (ref >=50)
LDL Cholesterol (Calc): 195 mg/dL — ABNORMAL HIGH
Non-HDL Cholesterol (Calc): 219 mg/dL — ABNORMAL HIGH (ref <130)
Total CHOL/HDL Ratio: 5.1 (calc) — ABNORMAL HIGH (ref <5.0)
Triglycerides: 112 mg/dL (ref <150)

## 2024-01-14 LAB — HEMOGLOBIN A1C
Hgb A1c MFr Bld: 5.8 % — ABNORMAL HIGH (ref <5.7)
Mean Plasma Glucose: 120 mg/dL
eAG (mmol/L): 6.6 mmol/L

## 2024-01-16 LAB — URINE CULTURE
MICRO NUMBER:: 16944383
SPECIMEN QUALITY:: ADEQUATE

## 2024-01-16 MED ORDER — SULFAMETHOXAZOLE-TRIMETHOPRIM 400-80 MG PO TABS
1.0000 | ORAL_TABLET | Freq: Two times a day (BID) | ORAL | 0 refills | Status: DC
Start: 2024-01-16 — End: 2024-02-12

## 2024-01-16 NOTE — Addendum Note (Signed)
 Addended by: ANTONETTE ANGELINE ORN on: 01/16/2024 08:37 AM   Modules accepted: Orders

## 2024-02-09 ENCOUNTER — Telehealth: Payer: Self-pay | Admitting: Neurosurgery

## 2024-02-09 DIAGNOSIS — G479 Sleep disorder, unspecified: Secondary | ICD-10-CM | POA: Diagnosis not present

## 2024-02-09 DIAGNOSIS — G919 Hydrocephalus, unspecified: Secondary | ICD-10-CM | POA: Diagnosis not present

## 2024-02-09 DIAGNOSIS — R519 Headache, unspecified: Secondary | ICD-10-CM | POA: Diagnosis not present

## 2024-02-09 DIAGNOSIS — M542 Cervicalgia: Secondary | ICD-10-CM | POA: Diagnosis not present

## 2024-02-09 NOTE — Telephone Encounter (Signed)
 Hi, patient is experiencing recurring headaches and back pain. She's wondering if this is something you can evaluate, or if she would need to be seen at the [place] office instead?

## 2024-02-10 ENCOUNTER — Encounter: Payer: Self-pay | Admitting: Internal Medicine

## 2024-02-12 ENCOUNTER — Ambulatory Visit: Admitting: Neurosurgery

## 2024-02-12 ENCOUNTER — Encounter: Payer: Self-pay | Admitting: Neurosurgery

## 2024-02-12 ENCOUNTER — Ambulatory Visit (INDEPENDENT_AMBULATORY_CARE_PROVIDER_SITE_OTHER)

## 2024-02-12 VITALS — BP 118/62 | Ht 66.0 in | Wt 215.2 lb

## 2024-02-12 DIAGNOSIS — G911 Obstructive hydrocephalus: Secondary | ICD-10-CM

## 2024-02-12 DIAGNOSIS — R519 Headache, unspecified: Secondary | ICD-10-CM

## 2024-02-12 DIAGNOSIS — Z9689 Presence of other specified functional implants: Secondary | ICD-10-CM | POA: Diagnosis not present

## 2024-02-12 DIAGNOSIS — M47812 Spondylosis without myelopathy or radiculopathy, cervical region: Secondary | ICD-10-CM | POA: Diagnosis not present

## 2024-02-12 DIAGNOSIS — Z982 Presence of cerebrospinal fluid drainage device: Secondary | ICD-10-CM | POA: Diagnosis not present

## 2024-02-12 DIAGNOSIS — M4802 Spinal stenosis, cervical region: Secondary | ICD-10-CM | POA: Diagnosis not present

## 2024-02-12 NOTE — Progress Notes (Signed)
   REFERRING PHYSICIAN:  Antonette Angeline LELON [place] [address] 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    She has been having some headaches.  These are worst later in the day.  In the mornings, and when she lies down, she feels well.     PHYSICAL EXAMINATION:  NEUROLOGICAL:  General: In no acute distress.   Awake, alert, .  Cranial nerves appear intact.  She moves all extremities well.  Strength:   Incisions c/d/I and healing well   Imaging:  No recent imaging Assessment / Plan: [name] is doing fair.  She has had a return of headaches that are worse in the afternoon.  I wonder if she is having over drainage.  Will get a CT scan and shunt series.  Will have her back in clinic for reprogramming after that.    I spent a total of 15 minutes in this patient's care today. This time was spent reviewing pertinent records including imaging studies, obtaining and confirming history, performing a directed evaluation, formulating and discussing my recommendations, and documenting the visit within the medical record.    Reeves Daisy MD Dept of Neurosurgery

## 2024-02-18 ENCOUNTER — Ambulatory Visit
Admission: RE | Admit: 2024-02-18 | Discharge: 2024-02-18 | Disposition: A | Discharge status: Home / Self Care | Source: Ambulatory Visit | Attending: Neurosurgery | Admitting: Neurosurgery

## 2024-02-18 DIAGNOSIS — G9389 Other specified disorders of brain: Secondary | ICD-10-CM | POA: Diagnosis not present

## 2024-02-18 DIAGNOSIS — G911 Obstructive hydrocephalus: Secondary | ICD-10-CM

## 2024-02-18 DIAGNOSIS — R519 Headache, unspecified: Secondary | ICD-10-CM

## 2024-02-23 ENCOUNTER — Ambulatory Visit (INDEPENDENT_AMBULATORY_CARE_PROVIDER_SITE_OTHER): Admitting: Audiology

## 2024-02-23 ENCOUNTER — Encounter (INDEPENDENT_AMBULATORY_CARE_PROVIDER_SITE_OTHER): Payer: Self-pay | Admitting: Otolaryngology

## 2024-02-23 ENCOUNTER — Ambulatory Visit (INDEPENDENT_AMBULATORY_CARE_PROVIDER_SITE_OTHER): Admitting: Otolaryngology

## 2024-02-23 VITALS — BP 152/60 | HR 80 | Temp 98.0°F | Ht 66.0 in | Wt 205.0 lb

## 2024-02-23 DIAGNOSIS — J310 Chronic rhinitis: Secondary | ICD-10-CM

## 2024-02-23 DIAGNOSIS — H903 Sensorineural hearing loss, bilateral: Secondary | ICD-10-CM | POA: Diagnosis not present

## 2024-02-23 DIAGNOSIS — H90A21 Sensorineural hearing loss, unilateral, right ear, with restricted hearing on the contralateral side: Secondary | ICD-10-CM

## 2024-02-23 DIAGNOSIS — H7203 Central perforation of tympanic membrane, bilateral: Secondary | ICD-10-CM

## 2024-02-23 DIAGNOSIS — H90A32 Mixed conductive and sensorineural hearing loss, unilateral, left ear with restricted hearing on the contralateral side: Secondary | ICD-10-CM | POA: Diagnosis not present

## 2024-02-23 DIAGNOSIS — R0981 Nasal congestion: Secondary | ICD-10-CM

## 2024-02-23 DIAGNOSIS — J343 Hypertrophy of nasal turbinates: Secondary | ICD-10-CM

## 2024-02-23 NOTE — Progress Notes (Signed)
  [address], [address] [place], [place] [address] [phone]  Audiological Evaluation    Name: Michele Newton     DOB:   1955/08/12      MRN:   [id number]                                                                                     Service Date: 02/23/2024     Accompanied by: unaccompanied   Patient comes today after Dr. Karis, ENT sent a referral for a hearing evaluation due to concerns with left eardrum perforation.   Symptoms Yes Details  Hearing loss  [x]  Reports hearing loss is worse int he left ear.  Tinnitus  []    Ear pain/ infections/pressure  [x]  Reports a small right eardrum perforation since she had tubes placed 10 years ago. Reports a recent left eardrum perforation after she had COVID -19  Balance problems  []    Noise exposure history  []    Previous ear surgeries  []    Family history of hearing loss  []    Amplification  [x]  Patient has a set of hearing aids that reports to have purchased about 6 months ago from [employer]  Other  []      Otoscopy: Right ear: Abnormal eardrum appearance. Left ear:  Abnormal eardrum appearance.  Tympanometry: Right ear: Type B- Large external ear canal volume with no middle ear pressure peak or tympanic membrane compliance. Left ear: Type B- Large external ear canal volume with no middle ear pressure peak or tympanic membrane compliance.  Pure tone Audiometry: Right ear- Normal to moderately severe sensorineural hearing loss from 250 Hz - 8000 Hz. 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  Speech Audiometry: Right ear- Speech Reception Threshold (SRT) was obtained at 45 dBHL. Left ear-Speech Reception Threshold (SRT) was obtained at 60 dBHL.   Word Recognition Score Tested using NU-6 (recorded) Right ear: 96% was obtained at a presentation level of 85 dBHL with contralateral masking which is deemed as  excellent. Left ear: 80% was obtained at a presentation level of 95 dBHL with  contralateral masking which is deemed as  good .   The hearing test results were completed under headphones and results are deemed to be of good reliability. 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   Recommendations: Follow up with ENT as scheduled for today. Return for a hearing evaluation if concerns with hearing changes arise or per MD recommendation.   [name], AUD

## 2024-02-24 ENCOUNTER — Encounter: Payer: Self-pay | Admitting: Neurosurgery

## 2024-02-24 ENCOUNTER — Encounter: Payer: Self-pay | Admitting: Internal Medicine

## 2024-02-24 ENCOUNTER — Ambulatory Visit (INDEPENDENT_AMBULATORY_CARE_PROVIDER_SITE_OTHER): Admitting: Internal Medicine

## 2024-02-24 VITALS — BP 152/90 | Ht 66.0 in | Wt 213.8 lb

## 2024-02-24 DIAGNOSIS — F32A Depression, unspecified: Secondary | ICD-10-CM

## 2024-02-24 DIAGNOSIS — R350 Frequency of micturition: Secondary | ICD-10-CM | POA: Diagnosis not present

## 2024-02-24 DIAGNOSIS — F419 Anxiety disorder, unspecified: Secondary | ICD-10-CM | POA: Diagnosis not present

## 2024-02-24 DIAGNOSIS — I10 Essential (primary) hypertension: Secondary | ICD-10-CM | POA: Diagnosis not present

## 2024-02-24 DIAGNOSIS — R300 Dysuria: Secondary | ICD-10-CM

## 2024-02-24 DIAGNOSIS — J310 Chronic rhinitis: Secondary | ICD-10-CM | POA: Insufficient documentation

## 2024-02-24 LAB — POCT URINE DIPSTICK
Bilirubin, UA: NEGATIVE
Blood, UA: NEGATIVE
Glucose, UA: NEGATIVE mg/dL
Ketones, POC UA: NEGATIVE mg/dL
Leukocytes, UA: NEGATIVE
Nitrite, UA: NEGATIVE
POC PROTEIN,UA: NEGATIVE
Spec Grav, UA: 1.02 (ref 1.010–1.025)
Urobilinogen, UA: 0.2 U/dL
pH, UA: 6 (ref 5.0–8.0)

## 2024-02-24 MED ORDER — VERAPAMIL HCL ER 240 MG PO TBCR: 240.0000 mg | EXTENDED_RELEASE_TABLET | Freq: Every day | ORAL | 0 refills | Status: DC

## 2024-02-24 NOTE — Progress Notes (Signed)
 Subjective:    Patient ID: Michele Newton, female    DOB: 31-Jul-1955, 68 y.o.   MRN: [id number]  HPI  Michele Newton is a 68 year old female with recurrent urinary symptoms and hypertension who presents with urinary frequency and elevated blood pressure.  She experiences urinary symptoms including a burning sensation, increased frequency, and episodes of incontinence. Her urine appears dark in color.  She was seen 01/13/2024 for the same.  These symptoms initially improved with treatment (Macrobid  subsequently changed to Septra  after urine culture grew Klebsiella aerogenes) but worsened approximately one week after completing her medication.   She has a history of hypertension, which has been elevated for about a month. Her blood pressure readings at home have been around 150/80 mmHg, with a recent reading of 150/91 mmHg. She is currently taking verapamil  180 mg, which she has been on since before 2019. She has previously tried lisinopril  and valsartan  but experienced adverse effects. She reports daily headaches, which she suspects may be related to her blood pressure.  She is in the process of weaning off Paxil , currently taking half a pill for two weeks without issues. She is also interested in discontinuing Seroquel . She has previously experienced difficulty when attempting to stop Paxil , describing feelings of irritability and aggression. She is not currently experiencing these symptoms.  Review of Systems     Past Medical History:  Diagnosis Date   Allergy    Anxiety    Arthritis    Asthma    Depression    Frequent headaches    GERD (gastroesophageal reflux disease)    Hydrocephalus (HCC) 1995   Hypertension    Migraine    PONV (postoperative nausea and vomiting)    Psoriatic arthritis (HCC)     Current Outpatient Medications  Medication Sig Dispense Refill   butalbital-acetaminophen -caffeine (FIORICET) 50-325-40 MG tablet Take 1 tab at headache onset. Take another tablet in 2 hours  if needed. Take no more than 2 tablets in a 24 hour period.     fluticasone  (FLONASE ) 50 MCG/ACT nasal spray SMARTSIG:1-2 Spray(s) Both Nares Daily     levocetirizine (XYZAL) 5 MG tablet SMARTSIG:1 Tablet(s) By Mouth Every Evening     montelukast  (SINGULAIR ) 10 MG tablet TAKE 1 TABLET(10 MG) BY MOUTH AT BEDTIME 90 tablet 0   nortriptyline (PAMELOR) 10 MG capsule Take 10 mg by mouth.     pantoprazole  (PROTONIX ) 40 MG tablet Take 1 tablet (40 mg total) by mouth 2 (two) times daily. 180 tablet 0   PARoxetine  (PAXIL ) 40 MG tablet TAKE 1 AND 1/2 TABLETS(60 MG) BY MOUTH EVERY MORNING 135 tablet 0   QUEtiapine  (SEROQUEL ) 200 MG tablet Take 1 tablet (200 mg total) by mouth at bedtime. 90 tablet 1   rizatriptan  (MAXALT -MLT) 10 MG disintegrating tablet DISSOLVE 1 TABLET ON THE TONGUE AT ONSET OF MIGRAINE HEADACHE. MAY REPEAT IN 2 HOURS AS NEEDED. MAX 2 IN 24 HOURS 10 tablet 0   traZODone  (DESYREL ) 50 MG tablet Take 1 tablet (50 mg total) by mouth at bedtime. 90 tablet 1   verapamil  (CALAN -SR) 180 MG CR tablet TAKE 1 TABLET(180 MG) BY MOUTH DAILY 90 tablet 0   No current facility-administered medications for this visit.    Allergies  Allergen Reactions   Amitriptyline Other (See Comments)    hallucinations   Effexor [Venlafaxine] Other (See Comments)    Amnesia, hospitalized x 2 weeks   Haldol [Haloperidol] Other (See Comments)    Paralyzed vocal cords  Family History  Problem Relation Age of Onset   Arthritis Mother    Hyperlipidemia Mother    Heart disease Mother 69       Died of heart failure   Hypertension Mother    Diabetes Mother    Depression Mother    Obesity Mother    Arthritis Father    Cancer Father        prostate cancer   Diabetes Brother    Breast cancer Maternal Grandmother    Diabetes Sister    Colon cancer Neg Hx     Social History   Socioeconomic History   Marital status: Married    Spouse name: Arley   Number of children: 2   Years of education: 14    Highest education level: Some college, no degree  Occupational History    Employer: OTHER  Tobacco Use   Smoking status: Never   Smokeless tobacco: Never  Vaping Use   Vaping status: Never Used  Substance and Sexual Activity   Alcohol use: No   Drug use: No   Sexual activity: Not Currently    Birth control/protection: Post-menopausal  Other Topics Concern   Not on file  Social History Narrative   Tatiana lives at home with her husband, Arley, of 5 months and a cat named, Puff. She has 2 sons from a previous marriage. She has 4 grandchildren.    Social Drivers of Corporate investment banker Strain: Low Risk  (11/13/2023)   Overall Financial Resource Strain (CARDIA)    Difficulty of Paying Living Expenses: Not hard at all  Recent Concern: Financial Resource Strain - Medium Risk (11/12/2023)   Overall Financial Resource Strain (CARDIA)    Difficulty of Paying Living Expenses: Somewhat hard  Food Insecurity: No Food Insecurity (11/13/2023)   Hunger Vital Sign    Worried About Running Out of Food in the Last Year: Never true    Ran Out of Food in the Last Year: Never true  Transportation Needs: No Transportation Needs (11/13/2023)   PRAPARE - Administrator, Civil Service (Medical): No    Lack of Transportation (Non-Medical): No  Physical Activity: Inactive (11/13/2023)   Exercise Vital Sign    Days of Exercise per Week: 0 days    Minutes of Exercise per Session: 0 min  Stress: No Stress Concern Present (11/13/2023)   [employer] of Occupational Health - Occupational Stress Questionnaire    Feeling of Stress: Not at all  Social Connections: Socially Integrated (11/13/2023)   Social Connection and Isolation Panel    Frequency of Communication with Friends and Family: More than three times a week    Frequency of Social Gatherings with Friends and Family: More than three times a week    Attends Religious Services: More than 4 times per year    Active Member of [employer] or  Organizations: Yes    Attends Banker Meetings: More than 4 times per year    Marital Status: Married  Catering manager Violence: Not At Risk (11/13/2023)   Humiliation, Afraid, Rape, and Kick questionnaire    Fear of Current or Ex-Partner: No    Emotionally Abused: No    Physically Abused: No    Sexually Abused: No     Constitutional: Pt reports headaches. Denies fever, malaise, fatigue, or abrupt weight changes.  HEENT: Denies eye pain, eye redness, ear pain, ringing in the ears, wax buildup, runny nose, nasal congestion, bloody nose, or sore throat. Respiratory: Denies difficulty  breathing, shortness of breath, cough or sputum production.   Cardiovascular: Denies chest pain, chest tightness, palpitations or swelling in the hands or feet.  Gastrointestinal: Patient reports alternating constipation and diarrhea.  Denies abdominal pain, bloating, or blood in the stool.  GU: Pt reports burning with urination, urinary frequency, incontinence and dark urine. Denies urgency, pain with urination, blood in urine, odor or discharge. Musculoskeletal: Patient reports chronic back pain, left hip pain.  Denies decrease in range of motion, difficulty with gait, muscle pain or joint swelling.  Skin: Denies redness, rashes, lesions or ulcercations.  Neurological: Patient reports insomnia.  Denies dizziness, difficulty with memory, difficulty with speech or problems with balance and coordination.  Psych: Patient has a history of anxiety and depression.  Denies SI/HI.  No other specific complaints in a complete review of systems (except as listed in HPI above).  Objective:   Physical Exam  BP (!) 152/90   Ht 5' 6 (1.676 m)   Wt 213 lb 12.8 oz (97 kg)   BMI 34.51 kg/m    Wt Readings from Last 3 Encounters:  02/23/24 205 lb (93 kg)  02/12/24 215 lb 4 oz (97.6 kg)  01/13/24 212 lb (96.2 kg)    General: Appears her stated age, obese, in NAD. Cardiovascular: Normal rate and rhythm.  S1,S2 noted.  No murmur, rubs or gallops noted. No JVD or BLE edema. No carotid bruits noted. Pulmonary/Chest: Normal effort and positive vesicular breath sounds. No respiratory distress. No wheezes, rales or ronchi noted.  Musculoskeletal: No difficulty with gait.  Neurological: Alert and oriented. Coordination normal.  Psychiatric: Mood and affect normal. Behavior is normal. Judgment and thought content normal.     BMET    Component Value Date/Time   NA 139 01/13/2024 0919   K 4.6 01/13/2024 0919   CL 101 01/13/2024 0919   CO2 28 01/13/2024 0919   GLUCOSE 95 01/13/2024 0919   BUN 21 01/13/2024 0919   CREATININE 0.96 01/13/2024 0919   CALCIUM  10.0 01/13/2024 0919   GFRNONAA >60 02/24/2023 1219   GFRNONAA 69 10/09/2017 1024   GFRAA >60 11/20/2018 0408   GFRAA 79 10/09/2017 1024    Lipid Panel     Component Value Date/Time   CHOL 272 (H) 01/13/2024 0919   TRIG 112 01/13/2024 0919   HDL 53 01/13/2024 0919   CHOLHDL 5.1 (H) 01/13/2024 0919   VLDL 33.0 02/20/2017 0841   LDLCALC 195 (H) 01/13/2024 0919    CBC    Component Value Date/Time   WBC 10.6 01/13/2024 0919   RBC 5.33 (H) 01/13/2024 0919   HGB 15.3 01/13/2024 0919   HCT 46.3 (H) 01/13/2024 0919   PLT 458 (H) 01/13/2024 0919   MCV 86.9 01/13/2024 0919   MCH 28.7 01/13/2024 0919   MCHC 33.0 01/13/2024 0919   RDW 12.8 01/13/2024 0919   LYMPHSABS 2.2 07/18/2020 1054   MONOABS 0.8 07/18/2020 1054   EOSABS 0.1 07/18/2020 1054   BASOSABS 0.1 07/18/2020 1054    Hgb A1C Lab Results  Component Value Date   HGBA1C 5.8 (H) 01/13/2024           Assessment & Plan:  Assessment and Plan  Recurrent urinary symptoms (dysuria, frequency, incontinence) Symptoms improved with antibiotics but recurred post-treatment. Normal urinalysis. Possible postmenopausal atrophic changes. - Send urine culture. - Advise increased water  intake. - Consider vaginal estrogen cream if symptoms persist.  Hypertension Blood  pressure elevated at 150/80 mmHg. On verapamil  180 mg. Headaches may be related  to hypertension. - Increase verapamil  to 240 mg daily. - Schedule follow-up in two weeks to recheck blood pressure.  Depression and anxiety Weaning off Paxil , currently taking half a pill without issues. - Continue tapering Paxil  to every other day for two weeks, then stop if no issues. - Reassess Seroquel  discontinuation after 2-3 months off Paxil .   RTC in 2 weeks, follow-up HTN 5 months, follow-up chronic conditions Angeline Laura, NP

## 2024-02-24 NOTE — Progress Notes (Signed)
 Patient ID: Michele Newton, female   DOB: 11-17-1955, 68 y.o.   MRN: [id number]  Follow-up: Bilateral tympanic membrane perforations, hearing loss, recurrent sinusitis  HPI: The patient is a 68 year old female who returns today for her follow-up evaluation.  The patient has a history of bilateral tympanic membrane perforations and bilateral hearing loss.  She was also seen for recurrent sinusitis.  The patient returns today complaining of increasing hearing difficulty, especially on the left side.  She currently wears bilateral hearing aids.  In addition, she also complains of frequent headaches.  She recently underwent a head CT scan.  The CT scan was negative for any sinus disease.  Currently she denies any fever or visual change.  She also denies any otalgia, otorrhea, or vertigo.  Exam: General: Communicates without difficulty, well nourished, no acute distress. Head: Normocephalic, no evidence injury, no tenderness, facial buttresses intact without stepoff. Face/sinus: No tenderness to palpation and percussion. Facial movement is normal and symmetric. Eyes: PERRL, EOMI. No scleral icterus, conjunctivae clear. Neuro: CN II exam reveals vision grossly intact.  No nystagmus at any point of gaze. Ears: Auricles well formed without lesions.  Ear canals are intact without mass or lesion.  No erythema or edema is appreciated.  The patient has a small right TM perforation.  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  Nose: External evaluation reveals normal support and skin without lesions.  Dorsum is intact.  Anterior rhinoscopy reveals congested mucosa over anterior aspect of inferior turbinates and intact septum.  No purulence noted. Oral:  Oral cavity and oropharynx are intact, symmetric, without erythema or edema.  Mucosa is moist without lesions. Neck: Full range of motion without pain.  There is no significant lymphadenopathy.  No masses palpable.  Thyroid  bed within normal limits to  palpation.  Parotid glands and submandibular glands equal bilaterally without mass.  Trachea is midline. Neuro:  CN 2-12 grossly intact.   Her hearing test shows bilateral high-frequency sensorineural hearing loss.  In addition, she also has significant conductive hearing loss on the left side.  Assessment: 1.  The patient has a small right tympanic membrane perforation.  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 2.  Bilateral high-frequency sensorineural hearing loss, secondary to presbycusis.  In addition, she also has left ear conductive hearing loss, secondary to her large tympanic membrane perforation. 3.  Chronic rhinitis with nasal mucosal congestion.  Her recent CT scan did not show any acute or chronic sinusitis.  Plan: 1.  The physical exam findings and the hearing test results are reviewed with the patient. 2.  Continue the use of her hearing aids. 3.  The treatment options of her large left ear tympanic membrane perforation and conductive hearing loss are extensively discussed.  The options include continuing conservative observation versus surgical intervention with left tympanoplasty and temporalis fascia graft.  The risk, benefits, alternatives, and details of the procedure are extensively discussed.  Questions are invited and answered. 4.  Continue with bilateral dry ear precautions. 5.  The patient would like to proceed with the tympanoplasty procedure.  We will schedule the procedure in accordance with the patient's schedule.

## 2024-02-24 NOTE — Patient Instructions (Signed)
 Hypertension, Adult Hypertension is another name for high blood pressure. High blood pressure forces your heart to work harder to pump blood. This can cause problems over time. There are two numbers in a blood pressure reading. There is a top number (systolic) over a bottom number (diastolic). It is best to have a blood pressure that is below 120/80. What are the causes? The cause of this condition is not known. Some other conditions can lead to high blood pressure. What increases the risk? Some lifestyle factors can make you more likely to develop high blood pressure: Smoking. Not getting enough exercise or physical activity. Being overweight. Having too much fat, sugar, calories, or salt (sodium) in your diet. Drinking too much alcohol. Other risk factors include: Having any of these conditions: Heart disease. Diabetes. High cholesterol. Kidney disease. Obstructive sleep apnea. Having a family history of high blood pressure and high cholesterol. Age. The risk increases with age. Stress. What are the signs or symptoms? High blood pressure may not cause symptoms. Very high blood pressure (hypertensive crisis) may cause: Headache. Fast or uneven heartbeats (palpitations). Shortness of breath. Nosebleed. Vomiting or feeling like you may vomit (nauseous). Changes in how you see. Very bad chest pain. Feeling dizzy. Seizures. How is this treated? This condition is treated by making healthy lifestyle changes, such as: Eating healthy foods. Exercising more. Drinking less alcohol. Your doctor may prescribe medicine if lifestyle changes do not help enough and if: Your top number is above 130. Your bottom number is above 80. Your personal target blood pressure may vary. Follow these instructions at home: Eating and drinking  If told, follow the DASH eating plan. To follow this plan: Fill one half of your plate at each meal with fruits and vegetables. Fill one fourth of your plate  at each meal with whole grains. Whole grains include whole-wheat pasta, brown rice, and whole-grain bread. Eat or drink low-fat dairy products, such as skim milk or low-fat yogurt. Fill one fourth of your plate at each meal with low-fat (lean) proteins. Low-fat proteins include fish, chicken without skin, eggs, beans, and tofu. Avoid fatty meat, cured and processed meat, or chicken with skin. Avoid pre-made or processed food. Limit the amount of salt in your diet to less than 1,500 mg each day. Do not drink alcohol if: Your doctor tells you not to drink. You are pregnant, may be pregnant, or are planning to become pregnant. If you drink alcohol: Limit how much you have to: 0-1 drink a day for women. 0-2 drinks a day for men. Know how much alcohol is in your drink. In the [place] one drink equals one 12 oz bottle of beer (355 mL), one 5 oz glass of wine (148 mL), or one 1 oz glass of hard liquor (44 mL). Lifestyle  Work with your doctor to stay at a healthy weight or to lose weight. Ask your doctor what the best weight is for you. Get at least 30 minutes of exercise that causes your heart to beat faster (aerobic exercise) most days of the week. This may include walking, swimming, or biking. Get at least 30 minutes of exercise that strengthens your muscles (resistance exercise) at least 3 days a week. This may include lifting weights or doing Pilates. Do not smoke or use any products that contain nicotine or tobacco. If you need help quitting, ask your doctor. Check your blood pressure at home as told by your doctor. Keep all follow-up visits. Medicines Take over-the-counter and prescription medicines  only as told by your doctor. Follow directions carefully. Do not skip doses of blood pressure medicine. The medicine does not work as well if you skip doses. Skipping doses also puts you at risk for problems. Ask your doctor about side effects or reactions to medicines that you should watch  for. Contact a doctor if: You think you are having a reaction to the medicine you are taking. You have headaches that keep coming back. You feel dizzy. You have swelling in your ankles. You have trouble with your vision. Get help right away if: You get a very bad headache. You start to feel mixed up (confused). You feel weak or numb. You feel faint. You have very bad pain in your: Chest. Belly (abdomen). You vomit more than once. You have trouble breathing. These symptoms may be an emergency. Get help right away. Call 911. Do not wait to see if the symptoms will go away. Do not drive yourself to the hospital. Summary Hypertension is another name for high blood pressure. High blood pressure forces your heart to work harder to pump blood. For most people, a normal blood pressure is less than 120/80. Making healthy choices can help lower blood pressure. If your blood pressure does not get lower with healthy choices, you may need to take medicine. This information is not intended to replace advice given to you by your health care provider. Make sure you discuss any questions you have with your health care provider. Document Revised: 02/08/2021 Document Reviewed: 02/08/2021 Elsevier Patient Education  2024 [employer].

## 2024-02-25 LAB — URINE CULTURE
MICRO NUMBER:: 17127748
Result:: NO GROWTH
SPECIMEN QUALITY:: ADEQUATE

## 2024-02-26 ENCOUNTER — Ambulatory Visit: Payer: Self-pay | Admitting: Internal Medicine

## 2024-03-10 ENCOUNTER — Encounter: Payer: Self-pay | Admitting: Internal Medicine

## 2024-03-10 ENCOUNTER — Ambulatory Visit (INDEPENDENT_AMBULATORY_CARE_PROVIDER_SITE_OTHER): Admitting: Internal Medicine

## 2024-03-10 VITALS — BP 150/80 | Ht 66.0 in | Wt 217.0 lb

## 2024-03-10 DIAGNOSIS — Z6835 Body mass index (BMI) 35.0-35.9, adult: Secondary | ICD-10-CM

## 2024-03-10 DIAGNOSIS — I10 Essential (primary) hypertension: Secondary | ICD-10-CM

## 2024-03-10 DIAGNOSIS — K582 Mixed irritable bowel syndrome: Secondary | ICD-10-CM | POA: Diagnosis not present

## 2024-03-10 DIAGNOSIS — E66812 Obesity, class 2: Secondary | ICD-10-CM | POA: Diagnosis not present

## 2024-03-10 MED ORDER — AMLODIPINE-OLMESARTAN 10-20 MG PO TABS: 1.0000 | ORAL_TABLET | Freq: Every day | ORAL | 0 refills | Status: DC

## 2024-03-10 NOTE — Patient Instructions (Signed)
 Hypertension, Adult Hypertension is another name for high blood pressure. High blood pressure forces your heart to work harder to pump blood. This can cause problems over time. There are two numbers in a blood pressure reading. There is a top number (systolic) over a bottom number (diastolic). It is best to have a blood pressure that is below 120/80. What are the causes? The cause of this condition is not known. Some other conditions can lead to high blood pressure. What increases the risk? Some lifestyle factors can make you more likely to develop high blood pressure: Smoking. Not getting enough exercise or physical activity. Being overweight. Having too much fat, sugar, calories, or salt (sodium) in your diet. Drinking too much alcohol. Other risk factors include: Having any of these conditions: Heart disease. Diabetes. High cholesterol. Kidney disease. Obstructive sleep apnea. Having a family history of high blood pressure and high cholesterol. Age. The risk increases with age. Stress. What are the signs or symptoms? High blood pressure may not cause symptoms. Very high blood pressure (hypertensive crisis) may cause: Headache. Fast or uneven heartbeats (palpitations). Shortness of breath. Nosebleed. Vomiting or feeling like you may vomit (nauseous). Changes in how you see. Very bad chest pain. Feeling dizzy. Seizures. How is this treated? This condition is treated by making healthy lifestyle changes, such as: Eating healthy foods. Exercising more. Drinking less alcohol. Your doctor may prescribe medicine if lifestyle changes do not help enough and if: Your top number is above 130. Your bottom number is above 80. Your personal target blood pressure may vary. Follow these instructions at home: Eating and drinking  If told, follow the DASH eating plan. To follow this plan: Fill one half of your plate at each meal with fruits and vegetables. Fill one fourth of your plate  at each meal with whole grains. Whole grains include whole-wheat pasta, brown rice, and whole-grain bread. Eat or drink low-fat dairy products, such as skim milk or low-fat yogurt. Fill one fourth of your plate at each meal with low-fat (lean) proteins. Low-fat proteins include fish, chicken without skin, eggs, beans, and tofu. Avoid fatty meat, cured and processed meat, or chicken with skin. Avoid pre-made or processed food. Limit the amount of salt in your diet to less than 1,500 mg each day. Do not drink alcohol if: Your doctor tells you not to drink. You are pregnant, may be pregnant, or are planning to become pregnant. If you drink alcohol: Limit how much you have to: 0-1 drink a day for women. 0-2 drinks a day for men. Know how much alcohol is in your drink. In the [place] one drink equals one 12 oz bottle of beer (355 mL), one 5 oz glass of wine (148 mL), or one 1 oz glass of hard liquor (44 mL). Lifestyle  Work with your doctor to stay at a healthy weight or to lose weight. Ask your doctor what the best weight is for you. Get at least 30 minutes of exercise that causes your heart to beat faster (aerobic exercise) most days of the week. This may include walking, swimming, or biking. Get at least 30 minutes of exercise that strengthens your muscles (resistance exercise) at least 3 days a week. This may include lifting weights or doing Pilates. Do not smoke or use any products that contain nicotine or tobacco. If you need help quitting, ask your doctor. Check your blood pressure at home as told by your doctor. Keep all follow-up visits. Medicines Take over-the-counter and prescription medicines  only as told by your doctor. Follow directions carefully. Do not skip doses of blood pressure medicine. The medicine does not work as well if you skip doses. Skipping doses also puts you at risk for problems. Ask your doctor about side effects or reactions to medicines that you should watch  for. Contact a doctor if: You think you are having a reaction to the medicine you are taking. You have headaches that keep coming back. You feel dizzy. You have swelling in your ankles. You have trouble with your vision. Get help right away if: You get a very bad headache. You start to feel mixed up (confused). You feel weak or numb. You feel faint. You have very bad pain in your: Chest. Belly (abdomen). You vomit more than once. You have trouble breathing. These symptoms may be an emergency. Get help right away. Call 911. Do not wait to see if the symptoms will go away. Do not drive yourself to the hospital. Summary Hypertension is another name for high blood pressure. High blood pressure forces your heart to work harder to pump blood. For most people, a normal blood pressure is less than 120/80. Making healthy choices can help lower blood pressure. If your blood pressure does not get lower with healthy choices, you may need to take medicine. This information is not intended to replace advice given to you by your health care provider. Make sure you discuss any questions you have with your health care provider. Document Revised: 02/08/2021 Document Reviewed: 02/08/2021 Elsevier Patient Education  2024 [employer].

## 2024-03-10 NOTE — Progress Notes (Signed)
 Subjective:    Patient ID: Michele Newton, female    DOB: 03-05-1956, 68 y.o.   MRN: [id number]  HPI  Discussed the use of AI scribe software for clinical note transcription with the patient, who gave verbal consent to proceed.  Michele Newton is a 68 year old female with hypertension who presents for blood pressure management.  She has been on verapamil  240 mg, increased from 120 mg, for hypertension management. Despite the increase, her blood pressure readings at home remain elevated, particularly in the mornings, with the lowest being 131/92 mmHg and the highest 170/98 mmHg. She takes her medication at night as it was originally prescribed that way. She has been on verapamil  since 2019, prescribed by a previous provider.  No history of heart problems and she has had EKGs in the past. No swelling in her legs or ankles.   Review of Systems     Past Medical History:  Diagnosis Date   Allergy    Anxiety    Arthritis    Asthma    Depression    Frequent headaches    GERD (gastroesophageal reflux disease)    Hydrocephalus (HCC) 1995   Hypertension    Migraine    PONV (postoperative nausea and vomiting)    Psoriatic arthritis (HCC)     Current Outpatient Medications  Medication Sig Dispense Refill   butalbital-acetaminophen -caffeine (FIORICET) 50-325-40 MG tablet Take 1 tab at headache onset. Take another tablet in 2 hours if needed. Take no more than 2 tablets in a 24 hour period.     fluticasone  (FLONASE ) 50 MCG/ACT nasal spray SMARTSIG:1-2 Spray(s) Both Nares Daily     levocetirizine (XYZAL) 5 MG tablet SMARTSIG:1 Tablet(s) By Mouth Every Evening     montelukast  (SINGULAIR ) 10 MG tablet TAKE 1 TABLET(10 MG) BY MOUTH AT BEDTIME 90 tablet 0   nortriptyline (PAMELOR) 10 MG capsule Take 10 mg by mouth.     pantoprazole  (PROTONIX ) 40 MG tablet Take 1 tablet (40 mg total) by mouth 2 (two) times daily. 180 tablet 0   PARoxetine  (PAXIL ) 40 MG tablet TAKE 1 AND 1/2 TABLETS(60 MG) BY MOUTH  EVERY MORNING (Patient taking differently: Half tablet) 135 tablet 0   QUEtiapine  (SEROQUEL ) 200 MG tablet Take 1 tablet (200 mg total) by mouth at bedtime. 90 tablet 1   rizatriptan  (MAXALT -MLT) 10 MG disintegrating tablet DISSOLVE 1 TABLET ON THE TONGUE AT ONSET OF MIGRAINE HEADACHE. MAY REPEAT IN 2 HOURS AS NEEDED. MAX 2 IN 24 HOURS 10 tablet 0   traZODone  (DESYREL ) 50 MG tablet Take 1 tablet (50 mg total) by mouth at bedtime. 90 tablet 1   verapamil  (CALAN -SR) 240 MG CR tablet Take 1 tablet (240 mg total) by mouth at bedtime. 30 tablet 0   No current facility-administered medications for this visit.    Allergies  Allergen Reactions   Amitriptyline Other (See Comments)    hallucinations   Effexor [Venlafaxine] Other (See Comments)    Amnesia, hospitalized x 2 weeks   Haldol [Haloperidol] Other (See Comments)    Paralyzed vocal cords     Family History  Problem Relation Age of Onset   Arthritis Mother    Hyperlipidemia Mother    Heart disease Mother 3       Died of heart failure   Hypertension Mother    Diabetes Mother    Depression Mother    Obesity Mother    Arthritis Father    Cancer Father  prostate cancer   Diabetes Brother    Breast cancer Maternal Grandmother    Diabetes Sister    Colon cancer Neg Hx     Social History   Socioeconomic History   Marital status: Married    Spouse name: Arley   Number of children: 2   Years of education: 14   Highest education level: Some college, no degree  Occupational History    Employer: OTHER  Tobacco Use   Smoking status: Never   Smokeless tobacco: Never  Vaping Use   Vaping status: Never Used  Substance and Sexual Activity   Alcohol use: No   Drug use: No   Sexual activity: Not Currently    Birth control/protection: Post-menopausal  Other Topics Concern   Not on file  Social History Narrative   Michele Newton lives at home with her husband, Arley, of 5 months and a cat named, Puff. She has 2 sons from a previous  marriage. She has 4 grandchildren.    Social Drivers of Corporate Investment Banker Strain: Low Risk  (11/13/2023)   Overall Financial Resource Strain (CARDIA)    Difficulty of Paying Living Expenses: Not hard at all  Recent Concern: Financial Resource Strain - Medium Risk (11/12/2023)   Overall Financial Resource Strain (CARDIA)    Difficulty of Paying Living Expenses: Somewhat hard  Food Insecurity: No Food Insecurity (11/13/2023)   Hunger Vital Sign    Worried About Running Out of Food in the Last Year: Never true    Ran Out of Food in the Last Year: Never true  Transportation Needs: No Transportation Needs (11/13/2023)   PRAPARE - Administrator, Civil Service (Medical): No    Lack of Transportation (Non-Medical): No  Physical Activity: Inactive (11/13/2023)   Exercise Vital Sign    Days of Exercise per Week: 0 days    Minutes of Exercise per Session: 0 min  Stress: No Stress Concern Present (11/13/2023)   [employer] of Occupational Health - Occupational Stress Questionnaire    Feeling of Stress: Not at all  Social Connections: Socially Integrated (11/13/2023)   Social Connection and Isolation Panel    Frequency of Communication with Friends and Family: More than three times a week    Frequency of Social Gatherings with Friends and Family: More than three times a week    Attends Religious Services: More than 4 times per year    Active Member of [employer] or Organizations: Yes    Attends Engineer, Structural: More than 4 times per year    Marital Status: Married  Catering Manager Violence: Not At Risk (11/13/2023)   Humiliation, Afraid, Rape, and Kick questionnaire    Fear of Current or Ex-Partner: No    Emotionally Abused: No    Physically Abused: No    Sexually Abused: No     Constitutional: Denies fever, malaise, fatigue, headache or abrupt weight changes.  HEENT: Denies eye pain, eye redness, ear pain, ringing in the ears, wax buildup, runny nose, nasal  congestion, bloody nose, or sore throat. Respiratory: Denies difficulty breathing, shortness of breath, cough or sputum production.   Cardiovascular: Denies chest pain, chest tightness, palpitations or swelling in the hands or feet.  Gastrointestinal: Patient reports alternating constipation and diarrhea.  Denies abdominal pain, bloating, or blood in the stool.  GU:  Denies urgency, frequency, pain with urination, burning with urination, blood in urine, odor or discharge. Musculoskeletal: Patient reports chronic back pain, left hip pain.  Denies decrease  in range of motion, difficulty with gait, muscle pain or joint swelling.  Skin: Denies redness, rashes, lesions or ulcercations.  Neurological: Patient reports insomnia.  Denies dizziness, difficulty with memory, difficulty with speech or problems with balance and coordination.  Psych: Patient has a history of anxiety and depression.  Denies SI/HI.  No other specific complaints in a complete review of systems (except as listed in HPI above).  Objective:   Physical Exam  BP (!) 150/80   Ht 5' 6 (1.676 m)   Wt 217 lb (98.4 kg)   BMI 35.02 kg/m    Wt Readings from Last 3 Encounters:  02/24/24 213 lb 12.8 oz (97 kg)  02/23/24 205 lb (93 kg)  02/12/24 215 lb 4 oz (97.6 kg)    General: Appears her stated age, obese, in NAD. Skin: Warm, dry and intact.  HEENT: Head: normal shape and size; Eyes: sclera white, no icterus, conjunctiva pink, PERRLA and EOMs intact;  Cardiovascular: Normal rate and rhythm. S1,S2 noted.  No murmur, rubs or gallops noted. No JVD or BLE edema. Pulmonary/Chest: Normal effort and positive vesicular breath sounds. No respiratory distress. No wheezes, rales or ronchi noted.  Neurological: Alert and oriented. Coordination normal.    BMET    Component Value Date/Time   NA 139 01/13/2024 0919   K 4.6 01/13/2024 0919   CL 101 01/13/2024 0919   CO2 28 01/13/2024 0919   GLUCOSE 95 01/13/2024 0919   BUN 21  01/13/2024 0919   CREATININE 0.96 01/13/2024 0919   CALCIUM  10.0 01/13/2024 0919   GFRNONAA >60 02/24/2023 1219   GFRNONAA 69 10/09/2017 1024   GFRAA >60 11/20/2018 0408   GFRAA 79 10/09/2017 1024    Lipid Panel     Component Value Date/Time   CHOL 272 (H) 01/13/2024 0919   TRIG 112 01/13/2024 0919   HDL 53 01/13/2024 0919   CHOLHDL 5.1 (H) 01/13/2024 0919   VLDL 33.0 02/20/2017 0841   LDLCALC 195 (H) 01/13/2024 0919    CBC    Component Value Date/Time   WBC 10.6 01/13/2024 0919   RBC 5.33 (H) 01/13/2024 0919   HGB 15.3 01/13/2024 0919   HCT 46.3 (H) 01/13/2024 0919   PLT 458 (H) 01/13/2024 0919   MCV 86.9 01/13/2024 0919   MCH 28.7 01/13/2024 0919   MCHC 33.0 01/13/2024 0919   RDW 12.8 01/13/2024 0919   LYMPHSABS 2.2 07/18/2020 1054   MONOABS 0.8 07/18/2020 1054   EOSABS 0.1 07/18/2020 1054   BASOSABS 0.1 07/18/2020 1054    Hgb A1C Lab Results  Component Value Date   HGBA1C 5.8 (H) 01/13/2024           Assessment & Plan:   Assessment and Plan    Hypertension Complicated by morbid obesity.  Blood pressure remains elevated despite verapamil  240 mg. Verapamil  deemed inappropriate and ineffective. Discussed risks of uncontrolled hypertension. Recommended amlodipine/olmesartan for better control. - Discontinue verapamil  240 mg. - Initiate amlodipine/olmesartan 10/20 mg in the morning. - Schedule follow-up in two weeks to assess blood pressure control. - Instruct her to monitor blood pressure once daily at home.        RTC in 4 months, follow-up chronic conditions Angeline Laura, NP

## 2024-03-15 ENCOUNTER — Observation Stay
Admission: EM | Admit: 2024-03-15 | Discharge: 2024-03-16 | Disposition: A | Discharge status: Home / Self Care | Attending: Internal Medicine | Admitting: Internal Medicine

## 2024-03-15 ENCOUNTER — Telehealth (INDEPENDENT_AMBULATORY_CARE_PROVIDER_SITE_OTHER): Payer: Self-pay | Admitting: Otolaryngology

## 2024-03-15 ENCOUNTER — Emergency Department

## 2024-03-15 ENCOUNTER — Encounter: Payer: Self-pay | Admitting: Internal Medicine

## 2024-03-15 ENCOUNTER — Telehealth: Payer: Self-pay | Admitting: Internal Medicine

## 2024-03-15 ENCOUNTER — Other Ambulatory Visit: Payer: Self-pay

## 2024-03-15 ENCOUNTER — Ambulatory Visit: Payer: Self-pay | Admitting: *Deleted

## 2024-03-15 DIAGNOSIS — M179 Osteoarthritis of knee, unspecified: Secondary | ICD-10-CM | POA: Insufficient documentation

## 2024-03-15 DIAGNOSIS — E860 Dehydration: Secondary | ICD-10-CM | POA: Diagnosis not present

## 2024-03-15 DIAGNOSIS — F419 Anxiety disorder, unspecified: Secondary | ICD-10-CM | POA: Diagnosis not present

## 2024-03-15 DIAGNOSIS — R079 Chest pain, unspecified: Secondary | ICD-10-CM | POA: Diagnosis not present

## 2024-03-15 DIAGNOSIS — Z982 Presence of cerebrospinal fluid drainage device: Secondary | ICD-10-CM | POA: Diagnosis not present

## 2024-03-15 DIAGNOSIS — R0789 Other chest pain: Secondary | ICD-10-CM | POA: Diagnosis not present

## 2024-03-15 DIAGNOSIS — J4520 Mild intermittent asthma, uncomplicated: Secondary | ICD-10-CM

## 2024-03-15 DIAGNOSIS — Z6835 Body mass index (BMI) 35.0-35.9, adult: Secondary | ICD-10-CM | POA: Diagnosis not present

## 2024-03-15 DIAGNOSIS — E669 Obesity, unspecified: Secondary | ICD-10-CM | POA: Diagnosis not present

## 2024-03-15 DIAGNOSIS — J9811 Atelectasis: Secondary | ICD-10-CM | POA: Diagnosis not present

## 2024-03-15 DIAGNOSIS — G44209 Tension-type headache, unspecified, not intractable: Secondary | ICD-10-CM | POA: Diagnosis not present

## 2024-03-15 DIAGNOSIS — I2510 Atherosclerotic heart disease of native coronary artery without angina pectoris: Secondary | ICD-10-CM | POA: Diagnosis not present

## 2024-03-15 DIAGNOSIS — Z7901 Long term (current) use of anticoagulants: Secondary | ICD-10-CM | POA: Insufficient documentation

## 2024-03-15 DIAGNOSIS — R030 Elevated blood-pressure reading, without diagnosis of hypertension: Secondary | ICD-10-CM | POA: Diagnosis present

## 2024-03-15 DIAGNOSIS — J45909 Unspecified asthma, uncomplicated: Secondary | ICD-10-CM | POA: Diagnosis not present

## 2024-03-15 DIAGNOSIS — E785 Hyperlipidemia, unspecified: Secondary | ICD-10-CM | POA: Diagnosis not present

## 2024-03-15 DIAGNOSIS — G43909 Migraine, unspecified, not intractable, without status migrainosus: Secondary | ICD-10-CM | POA: Diagnosis not present

## 2024-03-15 DIAGNOSIS — I672 Cerebral atherosclerosis: Secondary | ICD-10-CM | POA: Diagnosis not present

## 2024-03-15 DIAGNOSIS — F32A Depression, unspecified: Secondary | ICD-10-CM | POA: Diagnosis not present

## 2024-03-15 DIAGNOSIS — I10 Essential (primary) hypertension: Secondary | ICD-10-CM | POA: Diagnosis not present

## 2024-03-15 DIAGNOSIS — R0602 Shortness of breath: Secondary | ICD-10-CM | POA: Diagnosis present

## 2024-03-15 DIAGNOSIS — Z79899 Other long term (current) drug therapy: Secondary | ICD-10-CM | POA: Diagnosis not present

## 2024-03-15 DIAGNOSIS — E66812 Obesity, class 2: Secondary | ICD-10-CM | POA: Insufficient documentation

## 2024-03-15 DIAGNOSIS — N179 Acute kidney failure, unspecified: Secondary | ICD-10-CM | POA: Diagnosis not present

## 2024-03-15 DIAGNOSIS — I160 Hypertensive urgency: Secondary | ICD-10-CM | POA: Diagnosis not present

## 2024-03-15 DIAGNOSIS — H7292 Unspecified perforation of tympanic membrane, left ear: Secondary | ICD-10-CM | POA: Diagnosis present

## 2024-03-15 LAB — URINALYSIS, ROUTINE W REFLEX MICROSCOPIC
Bilirubin Urine: NEGATIVE
Glucose, UA: NEGATIVE mg/dL
Hgb urine dipstick: NEGATIVE
Ketones, ur: NEGATIVE mg/dL
Nitrite: NEGATIVE
Protein, ur: NEGATIVE mg/dL
RBC / HPF: 0 RBC/hpf (ref 0–5)
Specific Gravity, Urine: 1.019 (ref 1.005–1.030)
pH: 5 (ref 5.0–8.0)

## 2024-03-15 LAB — COMPREHENSIVE METABOLIC PANEL WITH GFR
ALT: 56 U/L — ABNORMAL HIGH (ref 0–44)
AST: 32 U/L (ref 15–41)
Albumin: 3.8 g/dL (ref 3.5–5.0)
Alkaline Phosphatase: 85 U/L (ref 38–126)
Anion gap: 12 (ref 5–15)
BUN: 14 mg/dL (ref 8–23)
CO2: 26 mmol/L (ref 22–32)
Calcium: 9.2 mg/dL (ref 8.9–10.3)
Chloride: 105 mmol/L (ref 98–111)
Creatinine, Ser: 1.03 mg/dL — ABNORMAL HIGH (ref 0.44–1.00)
GFR, Estimated: 59 mL/min — ABNORMAL LOW (ref >60)
Glucose, Bld: 129 mg/dL — ABNORMAL HIGH (ref 70–99)
Potassium: 4.3 mmol/L (ref 3.5–5.1)
Sodium: 143 mmol/L (ref 135–145)
Total Bilirubin: 0.7 mg/dL (ref 0.0–1.2)
Total Protein: 7.6 g/dL (ref 6.5–8.1)

## 2024-03-15 LAB — CBC
HCT: 46.4 % — ABNORMAL HIGH (ref 36.0–46.0)
Hemoglobin: 15.5 g/dL — ABNORMAL HIGH (ref 12.0–15.0)
MCH: 28.7 pg (ref 26.0–34.0)
MCHC: 33.4 g/dL (ref 30.0–36.0)
MCV: 85.8 fL (ref 80.0–100.0)
Platelets: 429 K/uL — ABNORMAL HIGH (ref 150–400)
RBC: 5.41 MIL/uL — ABNORMAL HIGH (ref 3.87–5.11)
RDW: 13.5 % (ref 11.5–15.5)
WBC: 9 K/uL (ref 4.0–10.5)
nRBC: 0 % (ref 0.0–0.2)

## 2024-03-15 LAB — URINE DRUG SCREEN, QUALITATIVE (ARMC ONLY)
Amphetamines, Ur Screen: NOT DETECTED
Barbiturates, Ur Screen: NOT DETECTED
Benzodiazepine, Ur Scrn: NOT DETECTED
Cannabinoid 50 Ng, Ur ~~LOC~~: NOT DETECTED
Cocaine Metabolite,Ur ~~LOC~~: NOT DETECTED
MDMA (Ecstasy)Ur Screen: NOT DETECTED
Methadone Scn, Ur: NOT DETECTED
Opiate, Ur Screen: NOT DETECTED
Phencyclidine (PCP) Ur S: NOT DETECTED
Tricyclic, Ur Screen: POSITIVE — AB

## 2024-03-15 LAB — TROPONIN I (HIGH SENSITIVITY): Troponin I (High Sensitivity): 3 ng/L (ref <18)

## 2024-03-15 LAB — BRAIN NATRIURETIC PEPTIDE: B Natriuretic Peptide: 5.9 pg/mL (ref 0.0–100.0)

## 2024-03-15 MED ORDER — ASPIRIN 81 MG PO CHEW
324.0000 mg | CHEWABLE_TABLET | Freq: Once | ORAL | Status: AC
  Administered 2024-03-15: 324 mg via ORAL
  Filled 2024-03-15: qty 4

## 2024-03-15 MED ORDER — SODIUM CHLORIDE 0.9 % IV SOLN: INTRAVENOUS | Status: AC

## 2024-03-15 MED ORDER — TRAZODONE HCL 50 MG PO TABS
50.0000 mg | ORAL_TABLET | Freq: Every day | ORAL | Status: DC
  Administered 2024-03-15: 50 mg via ORAL
  Filled 2024-03-15: qty 1

## 2024-03-15 MED ORDER — MONTELUKAST SODIUM 10 MG PO TABS
10.0000 mg | ORAL_TABLET | Freq: Every day | ORAL | Status: DC
  Administered 2024-03-15: 10 mg via ORAL
  Filled 2024-03-15: qty 1

## 2024-03-15 MED ORDER — QUETIAPINE FUMARATE 200 MG PO TABS
200.0000 mg | ORAL_TABLET | Freq: Every day | ORAL | Status: DC
  Administered 2024-03-15: 200 mg via ORAL
  Filled 2024-03-15: qty 1

## 2024-03-15 MED ORDER — PANTOPRAZOLE SODIUM 40 MG PO TBEC
40.0000 mg | DELAYED_RELEASE_TABLET | Freq: Two times a day (BID) | ORAL | Status: DC
  Administered 2024-03-15 – 2024-03-16 (×2): 40 mg via ORAL
  Filled 2024-03-15 (×2): qty 1

## 2024-03-15 MED ORDER — BUTALBITAL-APAP-CAFFEINE 50-325-40 MG PO TABS: 1.0000 | ORAL_TABLET | Freq: Four times a day (QID) | ORAL | Status: DC | PRN

## 2024-03-15 MED ORDER — LABETALOL HCL 5 MG/ML IV SOLN
10.0000 mg | Freq: Once | INTRAVENOUS | Status: AC
  Administered 2024-03-15: 10 mg via INTRAVENOUS
  Filled 2024-03-15: qty 4

## 2024-03-15 MED ORDER — NITROGLYCERIN 0.4 MG SL SUBL: 0.4000 mg | SUBLINGUAL_TABLET | SUBLINGUAL | Status: DC | PRN

## 2024-03-15 MED ORDER — MORPHINE SULFATE (PF) 2 MG/ML IV SOLN: 2.0000 mg | INTRAVENOUS | Status: DC | PRN

## 2024-03-15 MED ORDER — ALBUTEROL SULFATE (2.5 MG/3ML) 0.083% IN NEBU: 2.5000 mg | INHALATION_SOLUTION | RESPIRATORY_TRACT | Status: DC | PRN

## 2024-03-15 MED ORDER — ASPIRIN 81 MG PO CHEW
324.0000 mg | CHEWABLE_TABLET | Freq: Every day | ORAL | Status: DC
  Administered 2024-03-16: 324 mg via ORAL
  Filled 2024-03-15: qty 4

## 2024-03-15 MED ORDER — ENOXAPARIN SODIUM 60 MG/0.6ML IJ SOSY
0.5000 mg/kg | PREFILLED_SYRINGE | INTRAMUSCULAR | Status: DC
  Administered 2024-03-15: 50 mg via SUBCUTANEOUS
  Filled 2024-03-15: qty 0.6

## 2024-03-15 MED ORDER — DM-GUAIFENESIN ER 30-600 MG PO TB12: 1.0000 | ORAL_TABLET | Freq: Two times a day (BID) | ORAL | Status: DC | PRN

## 2024-03-15 MED ORDER — ACETAMINOPHEN 325 MG PO TABS: 650.0000 mg | ORAL_TABLET | Freq: Four times a day (QID) | ORAL | Status: DC | PRN

## 2024-03-15 MED ORDER — HYDRALAZINE HCL 20 MG/ML IJ SOLN: 10.0000 mg | INTRAMUSCULAR | Status: DC | PRN

## 2024-03-15 MED ORDER — VERAPAMIL HCL ER 240 MG PO TBCR
240.0000 mg | EXTENDED_RELEASE_TABLET | Freq: Every day | ORAL | Status: DC
  Administered 2024-03-16: 240 mg via ORAL
  Filled 2024-03-15 (×2): qty 1

## 2024-03-15 MED ORDER — ONDANSETRON HCL 4 MG/2ML IJ SOLN: 4.0000 mg | Freq: Three times a day (TID) | INTRAMUSCULAR | Status: DC | PRN

## 2024-03-15 NOTE — Telephone Encounter (Signed)
 FYI Only or Action Required?: FYI only for provider: ED advised.  Patient was last seen in primary care on 03/10/2024 by Antonette Angeline ORN, NP.  Called Nurse Triage reporting Palpitations.  Symptoms began several days ago.  Interventions attempted: Rest, hydration, or home remedies.  Symptoms are: unchanged.  Triage Disposition: Go to ED Now (Notify PCP)  Patient/caregiver understands and will follow disposition?: yes  Copied from CRM [id number]. Topic: Clinical - Red Word Triage >> Mar 15, 2024  8:42 AM Leonette SQUIBB wrote: Red Word that prompted transfer to Nurse Triage: pt is taking a new heart medication.  She is having some side effects, heart racing, dizziness, jaw pain, nausea Reason for Disposition  Feeling weak or lightheaded (e.g., woozy, feeling like they might faint)  Answer Assessment - Initial Assessment Questions 1. DESCRIPTION: Please describe your heart rate or heartbeat that you are having (e.g., fast/slow, regular/irregular, skipped or extra beats, palpitations)     Heart racing 2. ONSET: When did it start? (e.g., minutes, hours, days)      Symptoms started the day after starting Amlodipine- last dose-yesterday 3. DURATION: How long does it last (e.g., seconds, minutes, hours)     All day/all night 4. PATTERN Does it come and go, or has it been constant since it started?  Does it get worse with exertion?   Are you feeling it now?     constant 5. TAP: Using your hand, can you tap out what you are feeling on a chair or table in front of you, so that I can hear? Note: Not all patients can do this.       na 6. HEART RATE: Can you tell me your heart rate? How many beats in 15 seconds?  Note: Not all patients can do this.       128/74, 87 7. RECURRENT SYMPTOM: Have you ever had this before? If Yes, ask: When was the last time? and What happened that time?      no 8. CAUSE: What do you think is causing the palpitations?     medication 9. CARDIAC  HISTORY: Do you have any history of heart disease? (e.g., heart attack, angina, bypass surgery, angioplasty, arrhythmia)      no 10. OTHER SYMPTOMS: Do you have any other symptoms? (e.g., dizziness, chest pain, sweating, difficulty breathing)       Dizziness, jaw pain, nausea  Protocols used: Heart Rate and Heartbeat Questions-A-AH

## 2024-03-15 NOTE — Telephone Encounter (Signed)
 [facility] fax# [phone], phone # [phone].

## 2024-03-15 NOTE — Telephone Encounter (Signed)
 Spoke with Arley, he stated Shonette is still dizzy after taking the verapamil . Advised them again and per Angeline, NP she should go to the ED and get checked.

## 2024-03-15 NOTE — ED Triage Notes (Signed)
 C/O dizziness since Saturday.  Symptoms better when laying down.  Also c/o having trouble catching breath', left arm/hand pins and needles.   And c/O right sided chest pain.  Sate BP medications changed Friday. Changed from verapamil  to amlodipine.  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

## 2024-03-15 NOTE — Telephone Encounter (Signed)
 Does she have any of her verapamil  120 mg left?

## 2024-03-15 NOTE — Telephone Encounter (Signed)
 Recently taken off verapamil .  She had no prior history of tachyarrhythmia that she could recall.  Agree with advice given.  Will review ED note once available.

## 2024-03-15 NOTE — Telephone Encounter (Signed)
 Copied from CRM [id number]. Topic: Clinical - Red Word Triage >> Mar 15, 2024  8:45 AM Leonette SQUIBB wrote: Red Word that prompted transfer to Nurse Triage: pt called saying she was taking a new heart medication and since the she is having heart racing, nausea, jaw pain.

## 2024-03-15 NOTE — ED Notes (Signed)
 Pt made aware of need for urine sample, pt verbalized understanding.

## 2024-03-15 NOTE — Telephone Encounter (Signed)
 FYI Only or Action Required?: Action required by provider: clinical question for provider.  Patient was last seen in primary care on 03/10/2024 by Antonette Angeline ORN, NP.  Called Nurse Triage reporting Palpitations.  Triage Disposition: No disposition on file.  Patient/caregiver understands and will follow disposition?:   This RN attempted to contact patient for triage twice. No answer, voicemail left requesting return call to clinic.  Spoke with Spouse, Arley, whom is not with the patient at this time. States he will attempt to reach the patient and have her call the clinic.

## 2024-03-15 NOTE — ED Provider Notes (Signed)
.     Dicky Anes, MD 03/15/24 2051

## 2024-03-15 NOTE — ED Provider Notes (Signed)
 [facility] Provider Note    Event Date/Time   First MD Initiated Contact with Patient 03/15/24 1553     (approximate)   History   Dizziness   HPI  Michele Newton is a 68 y.o. female history of hypertension, has scheduled left ear surgery, previous shunt  No known history of cardiac disease.  Was having high blood pressure used to take verapamil  240 mg.  Her doctor's office just changed her off of verapamil  onto new medicine that she has been taking in the mornings.  Since stopping verapamil  she has noticed that she has not felt right she has been having some mild headaches and today she started developing more severe sort of throbbing headache without visual changes no numbness or weakness.  She also notes she is feeling slightly short of breath and a feeling of the chest pressure moderate across the middle of her chest.  No nausea or vomiting.  No back pain.  No pain in the abdomen.  No leg swelling.       Physical Exam   Triage Vital Signs: ED Triage Vitals  Encounter Vitals Group     BP 03/15/24 1405 (!) 184/100     Girls Systolic BP Percentile --      Girls Diastolic BP Percentile --      Boys Systolic BP Percentile --      Boys Diastolic BP Percentile --      Pulse Rate 03/15/24 1405 (!) 104     Resp 03/15/24 1405 16     Temp 03/15/24 1405 (!) 97.5 F (36.4 C)     Temp Source 03/15/24 1405 Oral     SpO2 03/15/24 1405 97 %     Weight 03/15/24 1403 216 lb 14.9 oz (98.4 kg)     Height --      Head Circumference --      Peak Flow --      Pain Score --      Pain Loc --      Pain Education --      Exclude from Growth Chart --     Most recent vital signs: Vitals:   03/15/24 1800 03/15/24 1920  BP: (!) 143/82 (!) 171/91  Pulse:  80  Resp:    Temp:    SpO2:  95%     General: Awake, no distress frankly but she reports feeling of dyspnea and that moderate chest pressure CV:  Good peripheral perfusion.  Slight tachycardia normal  tone Resp:  Normal effort.  No wheezing.  No rales. Abd:  No distention.  Soft nontender nondistended Other:  No edema.  Fully awake alert speech is normal and clear moves all extremities well.  Denies any numbness or weakness.  Reports a throbbing moderate to severe headache   ED Results / Procedures / Treatments   Labs (all labs ordered are listed, but only abnormal results are displayed) Labs Reviewed  COMPREHENSIVE METABOLIC PANEL WITH GFR - Abnormal; Notable for the following components:      Result Value   Glucose, Bld 129 (*)    Creatinine, Ser 1.03 (*)    ALT 56 (*)    GFR, Estimated 59 (*)    All other components within normal limits  CBC - Abnormal; Notable for the following components:   RBC 5.41 (*)    Hemoglobin 15.5 (*)    HCT 46.4 (*)    Platelets 429 (*)    All other components within normal limits  URINALYSIS, ROUTINE W REFLEX MICROSCOPIC - Abnormal; Notable for the following components:   Color, Urine YELLOW (*)    APPearance HAZY (*)    Leukocytes,Ua TRACE (*)    Bacteria, UA RARE (*)    All other components within normal limits  URINE CULTURE  BRAIN NATRIURETIC PEPTIDE  CBG MONITORING, ED  TROPONIN I (HIGH SENSITIVITY)   CBC without acute departure though slight elevated hemoglobin is noted.  Reassuring that initial troponin and now BNP are also normal  Comprehensive metabolic panel with minimally elevated ALT and slight elevation in creatinine very minimal  EKG  Inter by me at 1410 heart rate 100 QRS 80 QTc 450 Sinus tachycardia no evidence of frank ischemia.  Minimal perhaps very mild ST segment depressions in lateral precordial leads versus slight artifact   RADIOLOGY  CT Head Wo Contrast Result Date: 03/15/2024 EXAM: CT HEAD WITHOUT CONTRAST 03/15/2024 05:52:47 PM TECHNIQUE: CT of the head was performed without the administration of intravenous contrast. Automated exposure control, iterative reconstruction, and/or weight based adjustment of the  mA/kV was utilized to reduce the radiation dose to as low as reasonably achievable. COMPARISON: 02/18/2024 CLINICAL HISTORY: Headache, tension-type FINDINGS: BRAIN AND VENTRICLES: No acute hemorrhage. No evidence of acute infarct. No hydrocephalus. No extra-axial collection. No mass effect or midline shift. Similar position of right frontal approach shunt catheter with tip in the frontal horn of the left lateral ventricle. Similar encephalomalacia along the shunt catheter tract in right frontal lobe. Unchanged ventricular caliber. No hyperdense vessel. Calcific intracranial atherosclerosis. ORBITS: No acute abnormality. SINUSES: No acute abnormality. SOFT TISSUES AND SKULL: No acute soft tissue abnormality. No acute fracture. IMPRESSION: 1. No acute intracranial abnormality. 2. Similar position of right frontal approach shunt catheter. Similar ventricular caliber. Electronically signed by: Norman Gatlin MD 03/15/2024 06:20 PM EST RP Workstation: [id number]   DG Chest 2 View Result Date: 03/15/2024 CLINICAL DATA:  Chest pressure, shortness of breath, dizziness. EXAM: CHEST - 2 VIEW COMPARISON:  02/12/2024 and CT chest 03/07/2022. FINDINGS: Patient is slightly rotated. Trachea is midline. Heart is enlarged. Shunt catheters project over the right chest. Minimal right base atelectasis. Lungs are otherwise clear. No pleural fluid. IMPRESSION: Minimal right base atelectasis. Electronically Signed   By: Newell Eke M.D.   On: 03/15/2024 18:04    CT head negative for acute hemorrhage.  Chest x-ray inter by me as clear.  Radiologist noted mild atelectasis   PROCEDURES:  Critical Care performed: Yes, see critical care procedure note(s)  Procedures   MEDICATIONS ORDERED IN ED: Medications  hydrALAZINE (APRESOLINE) injection 10 mg (has no administration in time range)  albuterol  (VENTOLIN  HFA) 108 (90 Base) MCG/ACT inhaler 2 puff (has no administration in time range)  dextromethorphan-guaiFENesin   (MUCINEX  DM) 30-600 MG per 12 hr tablet 1 tablet (has no administration in time range)  ondansetron  (ZOFRAN ) injection 4 mg (has no administration in time range)  acetaminophen  (TYLENOL ) tablet 650 mg (has no administration in time range)  butalbital-acetaminophen -caffeine (FIORICET) 50-325-40 MG per tablet 1 tablet (has no administration in time range)  labetalol  (NORMODYNE ) injection 10 mg (10 mg Intravenous Given 03/15/24 1720)  aspirin chewable tablet 324 mg (324 mg Oral Given 03/15/24 2016)     IMPRESSION / MDM / ASSESSMENT AND PLAN / ED COURSE  I reviewed the triage vital signs and the nursing notes.  Differential diagnosis includes, but is not limited to, hypertensive emergency, intracranial hemorrhage, ACS, angina etc.  No back pain no ripping or tearing pain or component to be highly suggestive of acute aortic syndrome or dissection.  No pleuritic component and with her severely elevated blood pressure and her clinical history of stopping verapamil  rapidly I think unlikely to represent acute thromboembolism.  She does not have findings at this time of CHF her BNP is normal initial troponin normal.  Symptoms improving with blood pressure management stool and ongoing chest pressure, given her ongoing chest pressure age risk factors discussed with patient we will admit her for further cardiac workup and further evaluation of blood pressures and etiology.  Patient agreeable will initiate aspirin.  Informed her to notify her surgeon of today's visit as I would be very hesitant for her to walk-in to surgery on Thursday given today's events  Patient's presentation is most consistent with acute presentation with potential threat to life or bodily function.   The patient is on the cardiac monitor to evaluate for evidence of arrhythmia and/or significant heart rate changes.   Clinical Course as of 03/15/24 2036  Mon Mar 15, 2024  2025 Patient's blood pressure  improved currently 154 systolic at or close to objective goal lowering.  Heart rate improving.  She continues to have chest pressure no evidence to support obvious ACS will give aspirin.  His symptoms are improving but still present question if she may have Prinzmetal angina or other new anginal equivalent in the setting of rapid withdrawal of verapamil , etc.  She does have cardiac risk factors and given the persistence of chest pain as well as her presentation with severe hypertension and tachycardia I think she would benefit from further cardiac rule out and close observation.  She is supposed to have surgery on Thursday I advised her that I think that she should discuss today's presentation with her surgeon as well [MQ]  2026 Patient very agreeable with plan for admission [MQ]    Clinical Course User Index [MQ] Dicky Anes, MD   Patient accepted to hospitalist service after consultant with Dr. Hilma  FINAL CLINICAL IMPRESSION(S) / ED DIAGNOSES   Final diagnoses:  Moderate coronary artery risk chest pain  Malignant hypertension     Rx / DC Orders   ED Discharge Orders     None        Note:  This document was prepared using Dragon voice recognition software and may include unintentional dictation errors.   Dicky Anes, MD 03/15/24 2036

## 2024-03-15 NOTE — Telephone Encounter (Signed)
See NT encounter

## 2024-03-15 NOTE — ED Notes (Signed)
Provided snack box and water 

## 2024-03-15 NOTE — Telephone Encounter (Signed)
 Have her take verapamil  120 mg daily for 3 days in addition to new med and have her update me in 3 days with her symptoms.

## 2024-03-15 NOTE — H&P (Signed)
 History and Physical    Michele Newton FMW:[id number] DOB: [birth year] DOA: 03/15/2024  Referring MD/NP/PA:   PCP: Antonette Angeline ORN, NP   Patient coming from:  The patient is coming from home.     Chief Complaint: Elevated blood pressure, SOB, chest pressure, headache.   HPI: Michele Newton is a 68 y.o. female with medical history significant of HTN, HLD, asthma, GERD, depression with anxiety, hydrocephalus (s/p VP shunt), Psoriatec arthritis, migraine headache, IBS, hearing loss, perforated left eardrum, who presents with elevated blood pressure, SOB, chest pain, headache.  Pt states that her Bp  has been elevated recently.  She was taking verapamil  240 mg daily. Her PCP switched her from verapamil  to Azor (amlodipine 10 mg-olmesartan 20 mg) on 03/10/24.  Patient states that after she stopped verapamil  and started taking Azor, she has not been feeling well.  She developed SOB, palpitation, chest pain and pressure, dizziness, headache, with generalized bad feeling.  She has some nausea, no vomiting, diarrhea or abdominal pain.  No symptoms of UTI.  No fever or chills.  She reports severe headache which is located the top of her head, constant, pressure-like, nonradiating, not aggravated or alleviated by any known factors. Pt was instructed to hold Azor and take 120 mg of verapamil  without improvement today.   Pt was found to have elevated blood pressure 184/100, which improved to 141/85 after giving 10 mg of labetalol  in ED.  Her headache has also improved. Her SOB, chest pain and pressure has resolved when I saw pt in ED.  Of note, pt has eardrum perforation in left ear.  Patient is scheduled for surgery on Thursday.  Denies ear pain or ear drainage.  She has severe hearing loss in the left ear.   Data reviewed independently and ED Course: pt was found to have WBC 9.0, troponin 3, mild AKI with creatinine 1.03, BUN 14 and GFR 59 (recent baseline creatinine 0.96 and GFR> 60 on 01/13/2024).  Temperature  normal, heart rate 104 --> 80, RR 18, oxygen saturation 96% on room air.  CT of head negative for acute intracranial abnormalities.  Chest x-ray showed mild right basilar atelectasis.  Patient is placed in the PCU for outpatient.  CT-head: 1. No acute intracranial abnormality. 2. Similar position of right frontal approach shunt catheter. Similar ventricular caliber.   EKG: I have personally reviewed.  Sinus rhythm, QTc 458, LAE, nonspecific T wave change.   Review of Systems:   General: no fevers, chills, no body weight gain, has fatigue HEENT: no blurry vision, hearing changes or sore throat Respiratory: has dyspnea, no coughing, wheezing CV: has chest pain and pressure, palpitations GI: no nausea, vomiting, abdominal pain, diarrhea, constipation GU: no dysuria, burning on urination, increased urinary frequency, hematuria  Ext: no leg edema Neuro: no unilateral weakness, numbness, or tingling, no vision change or hearing loss Skin: no rash, no skin tear. MSK: No muscle spasm, no deformity, no limitation of range of movement in spin Heme: No easy bruising.  Travel history: No recent long distant travel.   Allergy:  Allergies  Allergen Reactions   Amitriptyline Other (See Comments)    hallucinations   Effexor [Venlafaxine] Other (See Comments)    Amnesia, hospitalized x 2 weeks   Haldol [Haloperidol] Other (See Comments)    Paralyzed vocal cords     Past Medical History:  Diagnosis Date   Allergy    Anxiety    Arthritis    Asthma    Depression    Frequent  headaches    GERD (gastroesophageal reflux disease)    Hydrocephalus (HCC) 1995   Hypertension    Migraine    PONV (postoperative nausea and vomiting)    Psoriatic arthritis (HCC)     Past Surgical History:  Procedure Laterality Date   BRAIN SURGERY     VA shunt   BREAST SURGERY  2000   biopsy   EYE SURGERY     JOINT REPLACEMENT     NASAL SINUS SURGERY  1998   SHUNT REVISION VENTRICULAR-PERITONEAL Right  02/26/2023   Procedure: VENTRICULOPERITONEAL SHUNT REVISION;  Surgeon: Clois Fret, MD;  Location: [facility];  Service: Neurosurgery;  Laterality: Right;   TENDON REPAIR Left    hand   TONSILLECTOMY AND ADENOIDECTOMY  1985   TOTAL HIP ARTHROPLASTY Left 11/19/2018   Procedure: TOTAL HIP ARTHROPLASTY ANTERIOR APPROACH;  Surgeon: Kathlynn Sharper, MD;  Location: [facility];  Service: Orthopedics;  Laterality: Left;   TOTAL HIP ARTHROPLASTY Right 07/27/2020   Procedure: TOTAL HIP ARTHROPLASTY ANTERIOR APPROACH;  Surgeon: Kathlynn Sharper, MD;  Location: [facility];  Service: Orthopedics;  Laterality: Right;   TOTAL HIP REVISION Right 12/25/2021   Procedure: Right hip revision, acetabular component & femoral head;  Surgeon: Kathlynn Sharper, MD;  Location: [facility];  Service: Orthopedics;  Laterality: Right;   TUBAL LIGATION     TYMPANOSTOMY TUBE PLACEMENT  1998   VENTRICULAR ATRIAL SHUNT Right 1995    Social History:  reports that she has never smoked. She has never used smokeless tobacco. She reports that she does not drink alcohol and does not use drugs.  Family History:  Family History  Problem Relation Age of Onset   Arthritis Mother    Hyperlipidemia Mother    Heart disease Mother 19       Died of heart failure   Hypertension Mother    Diabetes Mother    Depression Mother    Obesity Mother    Arthritis Father    Cancer Father        prostate cancer   Diabetes Brother    Breast cancer Maternal Grandmother    Diabetes Sister    Colon cancer Neg Hx      Prior to Admission medications   Medication Sig Start Date End Date Taking? Authorizing Provider  amlodipine-olmesartan (AZOR) 10-20 MG tablet Take 1 tablet by mouth daily. 03/10/24   Antonette Angeline ORN, NP  butalbital-acetaminophen -caffeine (FIORICET) 50-325-40 MG tablet Take 1 tab at headache onset. Take another tablet in 2 hours if needed. Take no more than 2 tablets in a 24 hour period. 12/17/23   [provider]  fluticasone   (FLONASE ) 50 MCG/ACT nasal spray SMARTSIG:1-2 Spray(s) Both Nares Daily    [provider]  levocetirizine (XYZAL) 5 MG tablet SMARTSIG:1 Tablet(s) By Mouth Every Evening 11/26/23   [provider]  montelukast  (SINGULAIR ) 10 MG tablet TAKE 1 TABLET(10 MG) BY MOUTH AT BEDTIME 12/01/23   Antonette Angeline ORN, NP  nortriptyline (PAMELOR) 10 MG capsule Take 10 mg by mouth. 04/23/23   [provider]  pantoprazole  (PROTONIX ) 40 MG tablet Take 1 tablet (40 mg total) by mouth 2 (two) times daily. 12/22/23   Antonette Angeline ORN, NP  PARoxetine  (PAXIL ) 40 MG tablet TAKE 1 AND 1/2 TABLETS(60 MG) BY MOUTH EVERY MORNING Patient not taking: Reported on 03/10/2024 12/01/23   Antonette Angeline ORN, NP  QUEtiapine  (SEROQUEL ) 200 MG tablet Take 1 tablet (200 mg total) by mouth at bedtime. 01/13/24   Antonette Angeline ORN, NP  rizatriptan  (MAXALT -MLT) 10 MG disintegrating tablet DISSOLVE 1 TABLET ON THE TONGUE AT ONSET OF MIGRAINE HEADACHE. MAY REPEAT IN 2 HOURS AS NEEDED. MAX 2 IN 24 HOURS 12/01/23   Antonette Angeline ORN, NP  traZODone  (DESYREL ) 50 MG tablet Take 1 tablet (50 mg total) by mouth at bedtime. 01/13/24   Antonette Angeline ORN, NP    Physical Exam: Vitals:   03/15/24 2030 03/15/24 2234 03/15/24 2330 03/16/24 0236  BP: (!) 148/85  135/71 (!) 140/80  Pulse: 83  85 81  Resp:   15 13  Temp:  97.9 F (36.6 C)  98.2 F (36.8 C)  TempSrc:  Oral  Oral  SpO2: 97%  96% 96%  Weight:       General: Not in acute distress HEENT:       Eyes: PERRL, EOMI, no jaundice       ENT: No discharge from the ears and nose, no pharynx injection, no tonsillar enlargement.        Neck: No JVD, no bruit, no mass felt. Heme: No neck lymph node enlargement. Cardiac: S1/S2, RRR, No murmurs, No gallops or rubs. Respiratory: No rales, wheezing, rhonchi or rubs. GI: Soft, nondistended, nontender, no rebound pain, no organomegaly, BS present. GU: No hematuria Ext: No pitting leg edema bilaterally. 1+DP/PT pulse  bilaterally. Musculoskeletal: No joint deformities, No joint redness or warmth, no limitation of ROM in spin. Skin: No rashes.  Neuro: Alert, oriented X3, cranial nerves II-XII grossly intact, moves all extremities normally.   Psych: Patient is not psychotic, no suicidal or hemocidal ideation.  Labs on Admission: I have personally reviewed following labs and imaging studies  CBC: Recent Labs  Lab 03/15/24 1406  WBC 9.0  HGB 15.5*  HCT 46.4*  MCV 85.8  PLT 429*   Basic Metabolic Panel: Recent Labs  Lab 03/15/24 1406  NA 143  K 4.3  CL 105  CO2 26  GLUCOSE 129*  BUN 14  CREATININE 1.03*  CALCIUM  9.2   GFR: Estimated Creatinine Clearance: 61.8 mL/min (A) (by C-G formula based on SCr of 1.03 mg/dL (H)). Liver Function Tests: Recent Labs  Lab 03/15/24 1406  AST 32  ALT 56*  ALKPHOS 85  BILITOT 0.7  PROT 7.6  ALBUMIN 3.8   No results for input(s): LIPASE, AMYLASE in the last 168 hours. No results for input(s): AMMONIA in the last 168 hours. Coagulation Profile: No results for input(s): INR, PROTIME in the last 168 hours. Cardiac Enzymes: No results for input(s): CKTOTAL, CKMB, CKMBINDEX, TROPONINI in the last 168 hours. BNP (last 3 results) No results for input(s): PROBNP in the last 8760 hours. HbA1C: No results for input(s): HGBA1C in the last 72 hours. CBG: No results for input(s): GLUCAP in the last 168 hours. Lipid Profile: No results for input(s): CHOL, HDL, LDLCALC, TRIG, CHOLHDL, LDLDIRECT in the last 72 hours. Thyroid  Function Tests: No results for input(s): TSH, T4TOTAL, FREET4, T3FREE, THYROIDAB in the last 72 hours. Anemia Panel: No results for input(s): VITAMINB12, FOLATE, FERRITIN, TIBC, IRON, RETICCTPCT in the last 72 hours. Urine analysis:    Component Value Date/Time   COLORURINE YELLOW (A) 03/15/2024 1406   APPEARANCEUR HAZY (A) 03/15/2024 1406   LABSPEC 1.019 03/15/2024 1406    PHURINE 5.0 03/15/2024 1406   GLUCOSEU NEGATIVE 03/15/2024 1406   HGBUR NEGATIVE 03/15/2024 1406   BILIRUBINUR NEGATIVE 03/15/2024 1406   BILIRUBINUR negative 02/24/2024 1408   BILIRUBINUR Negative 06/14/2022 1054   KETONESUR NEGATIVE 03/15/2024 1406   PROTEINUR NEGATIVE 03/15/2024  1406   UROBILINOGEN 0.2 02/24/2024 1408   NITRITE NEGATIVE 03/15/2024 1406   LEUKOCYTESUR TRACE (A) 03/15/2024 1406   Sepsis Labs: @LABRCNTIP (procalcitonin:4,lacticidven:4) )No results found for this or any previous visit (from the past 240 hours).   Radiological Exams on Admission:   Assessment/Plan Principal Problem:   Hypertensive urgency Active Problems:   Essential hypertension   Chest pain   HLD (hyperlipidemia)   Migraine   Asthma   AKI (acute kidney injury)   Perforated eardrum, left   Anxiety and depression   Obesity (BMI 30-39.9)   Assessment and Plan:   Hypertensive urgency: Blood pressure is up to 184/100 --> improved to 141//85 after giving 10 mg of labetalol .  -Please stay in PCU for observation - Will continue verapamil  240 mg daily - IV hydralazine as needed - Check UDS --> positive only for TCA  Essential hypertension -See above  Chest pain: Likely due to hypertensive urgency.  Troponin 3. -Aspirin - Trend troponin -.  Nitroglycerin and morphine  for chest pain  HLD (hyperlipidemia): Patient is not taking medications currently. - Follow-up with PCP  Migraine: Patient has severe headache which is likely due to hypertensive emergency.  Headache has improved along with improved hypertension. -Continue home Fioricet as needed - As needed Tylenol   Asthma: Stable -As needed Mucinex  and bronchodilators  AKI (acute kidney injury): Mild AKI.  Creatinine 1.03, BUN 14, GFR 59. -Normal saline 75 cc/h for 8-hour  Perforated eardrum, left -Patient is scheduled for surgery on on Thursday  Anxiety and depression: Patient states that she is not taking nortriptyline and  Paxil  -Continue home trazodone  and Seroquel   Obesity (BMI 30-39.9): Patient has Obesity Class II, with body weight 98.4 Kg and BMI 35.01 kg/m2.  - Encourage losing weight - Exercise and healthy diet        DVT ppx: SQ Lovenox   Code Status: Full code   Family Communication:     not done, no family member is at bed side.         Disposition Plan:  Anticipate discharge back to previous environment  Consults called:  none  Admission status and Level of care: Progressive:    for obs     Dispo: The patient is from: Home              Anticipated d/c is to: Home              Anticipated d/c date is: 1 day              Patient currently is not medically stable to d/c.    Severity of Illness:  The appropriate patient status for this patient is OBSERVATION. Observation status is judged to be reasonable and necessary in order to provide the required intensity of service to ensure the patient's safety. The patient's presenting symptoms, physical exam findings, and initial radiographic and laboratory data in the context of their medical condition is felt to place them at decreased risk for further clinical deterioration. Furthermore, it is anticipated that the patient will be medically stable for discharge from the hospital within 2 midnights of admission.        Date of Service 03/16/2024    Caleb Exon [facility]   If 7PM-7AM, please contact night-coverage [email or web address] 03/16/2024, 2:43 AM :

## 2024-03-15 NOTE — Telephone Encounter (Signed)
 Spoke with patient yes she does have verapamil  left

## 2024-03-15 NOTE — Telephone Encounter (Signed)
 Patient notified of instructions, verbal understanding, she will call back in 3 days for updates.

## 2024-03-15 NOTE — Telephone Encounter (Signed)
 Arley the spouse called in asking if they have to go to the ER as instructed by triage or if she can be worked in to see her provider. He states she would rather see if provider.if possible. I called in and spoke with Rosina who talked with the provider and then asked me to transfer Arley for further assistance.

## 2024-03-16 ENCOUNTER — Observation Stay

## 2024-03-16 DIAGNOSIS — E860 Dehydration: Secondary | ICD-10-CM | POA: Insufficient documentation

## 2024-03-16 DIAGNOSIS — K449 Diaphragmatic hernia without obstruction or gangrene: Secondary | ICD-10-CM | POA: Diagnosis not present

## 2024-03-16 DIAGNOSIS — I700 Atherosclerosis of aorta: Secondary | ICD-10-CM | POA: Diagnosis not present

## 2024-03-16 LAB — TROPONIN I (HIGH SENSITIVITY)
Troponin I (High Sensitivity): 2 ng/L (ref <18)
Troponin I (High Sensitivity): 3 ng/L (ref <18)

## 2024-03-16 LAB — CBC
HCT: 42.2 % (ref 36.0–46.0)
Hemoglobin: 13.8 g/dL (ref 12.0–15.0)
MCH: 28.3 pg (ref 26.0–34.0)
MCHC: 32.7 g/dL (ref 30.0–36.0)
MCV: 86.7 fL (ref 80.0–100.0)
Platelets: 354 K/uL (ref 150–400)
RBC: 4.87 MIL/uL (ref 3.87–5.11)
RDW: 13.5 % (ref 11.5–15.5)
WBC: 8 K/uL (ref 4.0–10.5)
nRBC: 0 % (ref 0.0–0.2)

## 2024-03-16 LAB — TROPONIN T, HIGH SENSITIVITY: Troponin T High Sensitivity: <15 ng/L (ref 0–19)

## 2024-03-16 LAB — BASIC METABOLIC PANEL WITH GFR
Anion gap: 10 (ref 5–15)
BUN: 14 mg/dL (ref 8–23)
CO2: 22 mmol/L (ref 22–32)
Calcium: 8.4 mg/dL — ABNORMAL LOW (ref 8.9–10.3)
Chloride: 109 mmol/L (ref 98–111)
Creatinine, Ser: 0.78 mg/dL (ref 0.44–1.00)
GFR, Estimated: >60 mL/min (ref >60)
Glucose, Bld: 120 mg/dL — ABNORMAL HIGH (ref 70–99)
Potassium: 3.7 mmol/L (ref 3.5–5.1)
Sodium: 141 mmol/L (ref 135–145)

## 2024-03-16 LAB — URINE CULTURE: Culture: NO GROWTH

## 2024-03-16 MED ORDER — VERAPAMIL HCL ER 240 MG PO TBCR: 240.0000 mg | EXTENDED_RELEASE_TABLET | Freq: Every day | ORAL | Status: DC

## 2024-03-16 MED ORDER — IOHEXOL 300 MG/ML  SOLN: 75.0000 mL | Freq: Once | INTRAMUSCULAR | Status: DC | PRN

## 2024-03-16 MED ORDER — IOHEXOL 350 MG/ML SOLN
75.0000 mL | Freq: Once | INTRAVENOUS | Status: AC | PRN
  Administered 2024-03-16: 75 mL via INTRAVENOUS

## 2024-03-16 MED ORDER — KETOROLAC TROMETHAMINE 15 MG/ML IJ SOLN
15.0000 mg | Freq: Once | INTRAMUSCULAR | Status: AC
  Administered 2024-03-16: 15 mg via INTRAVENOUS
  Filled 2024-03-16: qty 1

## 2024-03-16 NOTE — ED Notes (Signed)
 Patient transported to CT

## 2024-03-16 NOTE — Discharge Summary (Signed)
 Physician Discharge Summary   Patient: Michele Newton MRN: [id number] DOB: 05-Jan-1956  Admit date:     03/15/2024  Discharge date: 03/16/24  Discharge Physician: Charlie Patterson   PCP: Antonette Angeline ORN, NP   Recommendations at discharge:   Follow-up PCP 5 days  Discharge Diagnoses: Principal Problem:   Hypertensive urgency Active Problems:   Shortness of breath   Chest pain   HLD (hyperlipidemia)   Migraine   Asthma   Perforated eardrum, left   Anxiety and depression   Obesity (BMI 30-39.9)   Dehydration    Hospital Course: 68 y.o. female with medical history significant of HTN, HLD, asthma, GERD, depression with anxiety, hydrocephalus (s/p VP shunt), Psoriatec arthritis, migraine headache, IBS, hearing loss, perforated left eardrum, who presents with elevated blood pressure, SOB, chest pain, headache.   Pt states that her Bp  has been elevated recently.  She was taking verapamil  240 mg daily. Her PCP switched her from verapamil  to Azor (amlodipine 10 mg-olmesartan 20 mg) on 03/10/24.  Patient states that after she stopped verapamil  and started taking Azor, she has not been feeling well.  She developed SOB, palpitation, chest pain and pressure, dizziness, headache, with generalized bad feeling.  She has some nausea, no vomiting, diarrhea or abdominal pain.  No symptoms of UTI.  No fever or chills.  She reports severe headache which is located the top of her head, constant, pressure-like, nonradiating, not aggravated or alleviated by any known factors. Pt was instructed to hold Azor and take 120 mg of verapamil  without improvement today.    Pt was found to have elevated blood pressure 184/100, which improved to 141/85 after giving 10 mg of labetalol  in ED.  Her headache has also improved. Her SOB, chest pain and pressure has resolved when I saw pt in ED.   Of note, pt has eardrum perforation in left ear.  Patient is scheduled for surgery on Thursday.  Denies ear pain or ear drainage.   She has severe hearing loss in the left ear.  11/11.  Patient seen on 3 different occasions today.  Patient was placed back on her verapamil  and blood pressures have improved.  She states she did not do well with the amlodipine telmisartan and has been lying around for about a week.  CT scan of the chest negative for pulmonary embolism.  Patient did get a little short of breath with walking around with myself and nursing staff.  In the afternoon on the fourth time walking around she felt better.  She wanted to go home.  Will continue her verapamil  for now.  Blood pressure upon discharge 136/84.  Recommend close clinical follow-up as an outpatient    Assessment and Plan: * Hypertensive urgency Patient stated she does not do well with the Azor.  She was switched back to verapamil .  Blood pressure in a better range for discharge.  Patient not orthostatic.  Chest pain Cardiac enzymes x 3 negative.  Shortness of breath Could be secondary to deconditioning and lying around for a week.  Patient was short of breath walking around initially but improved throughout the day.  Pulse ox with ambulation normal.  CT scan of the chest negative for pulmonary embolism.  Migraine 1 dose of Toradol  given  Perforated eardrum, left Follow-up as outpatient  Anxiety and depression Continue chronic medications  Obesity (BMI 30-39.9) Class II with BMI 35.01  Dehydration Patient given IV fluids overnight.  Does not meet criteria for acute kidney injury.  Consultants: None Procedures performed: None Disposition: Home Diet recommendation:  Cardiac diet DISCHARGE MEDICATION: Allergies as of 03/16/2024       Reactions   Amitriptyline Other (See Comments)   hallucinations   Effexor [venlafaxine] Other (See Comments)   Amnesia, hospitalized x 2 weeks   Haldol [haloperidol] Other (See Comments)   Paralyzed vocal cords         Medication List     STOP taking these medications     amlodipine-olmesartan 10-20 MG tablet Commonly known as: AZOR   levocetirizine 5 MG tablet Commonly known as: XYZAL   nortriptyline 10 MG capsule Commonly known as: PAMELOR   PARoxetine  40 MG tablet Commonly known as: PAXIL        TAKE these medications    butalbital-acetaminophen -caffeine 50-325-40 MG tablet Commonly known as: FIORICET Take 1 tab at headache onset. Take another tablet in 2 hours if needed. Take no more than 2 tablets in a 24 hour period.   fluticasone  50 MCG/ACT nasal spray Commonly known as: FLONASE  SMARTSIG:1-2 Spray(s) Both Nares Daily   montelukast  10 MG tablet Commonly known as: SINGULAIR  TAKE 1 TABLET(10 MG) BY MOUTH AT BEDTIME   pantoprazole  40 MG tablet Commonly known as: PROTONIX  Take 1 tablet (40 mg total) by mouth 2 (two) times daily.   QUEtiapine  200 MG tablet Commonly known as: SEROQUEL  Take 1 tablet (200 mg total) by mouth at bedtime.   rizatriptan  10 MG disintegrating tablet Commonly known as: MAXALT -MLT DISSOLVE 1 TABLET ON THE TONGUE AT ONSET OF MIGRAINE HEADACHE. MAY REPEAT IN 2 HOURS AS NEEDED. MAX 2 IN 24 HOURS   traZODone  50 MG tablet Commonly known as: DESYREL  Take 1 tablet (50 mg total) by mouth at bedtime.   verapamil  240 MG CR tablet Commonly known as: CALAN -SR Take 1 tablet (240 mg total) by mouth daily. Start taking on: March 17, 2024 What changed:  medication strength how much to take        Follow-up Information     Antonette Angeline ORN, NP Follow up in 5 day(s).   Specialties: Internal Medicine, Emergency Medicine Contact information: [address] [place] [address] [phone]                Discharge Exam: Filed Weights   03/15/24 1403  Weight: 98.4 kg   Physical Exam HENT:     Head: Normocephalic.     Mouth/Throat:     Pharynx: No oropharyngeal exudate.  Eyes:     General: Lids are normal.     Conjunctiva/sclera: Conjunctivae normal.  Cardiovascular:     Rate and Rhythm: Normal  rate and regular rhythm.     Heart sounds: Normal heart sounds, S1 normal and S2 normal.  Pulmonary:     Breath sounds: No decreased breath sounds, wheezing, rhonchi or rales.  Abdominal:     Palpations: Abdomen is soft.     Tenderness: There is no abdominal tenderness.  Musculoskeletal:     Right lower leg: No swelling.     Left lower leg: No swelling.  Skin:    General: Skin is warm.     Findings: No rash.  Neurological:     Mental Status: She is alert and oriented to person, place, and time.      Condition at discharge: stable  The results of significant diagnostics from this hospitalization (including imaging, microbiology, ancillary and laboratory) are listed below for reference.   Imaging Studies: CT Angio Chest Pulmonary Embolism (PE) W or WO Contrast Result  Date: 03/16/2024 CLINICAL DATA:  Possible pulmonary embolism.  Negative D-dimer. EXAM: CT ANGIOGRAPHY CHEST WITH CONTRAST TECHNIQUE: Multidetector CT imaging of the chest was performed using the standard protocol during bolus administration of intravenous contrast. Multiplanar CT image reconstructions and MIPs were obtained to evaluate the vascular anatomy. RADIATION DOSE REDUCTION: This exam was performed according to the departmental dose-optimization program which includes automated exposure control, adjustment of the mA and/or kV according to patient size and/or use of iterative reconstruction technique. CONTRAST:  75mL OMNIPAQUE  IOHEXOL  350 MG/ML SOLN COMPARISON:  03/07/2022 FINDINGS: Cardiovascular: Mild cardiomegaly. Minimal calcified plaque over the left anterior descending coronary artery. Thoracic aorta is normal in caliber. Very minimal calcified plaque over the descending thoracic aorta. Pulmonary arterial system is adequately opacified without evidence of emboli. Remaining vascular structures are unremarkable. Mediastinum/Nodes: No significant mediastinal or hilar adenopathy. Moderate size hiatal hernia unchanged.  Lungs/Pleura: Lungs are adequately inflated without acute airspace consolidation or effusion. Airways are normal. Upper Abdomen: Visualized images over the upper abdomen demonstrate no acute findings. Musculoskeletal: No focal abnormality. Review of the MIP images confirms the above findings. IMPRESSION: 1. No acute cardiopulmonary disease and no evidence of pulmonary embolism. 2. Mild cardiomegaly. Minimal atherosclerotic coronary artery disease. 3. Moderate size hiatal hernia unchanged. 4. Aortic atherosclerosis. Aortic Atherosclerosis (ICD10-I70.0). Electronically Signed   By: Toribio Agreste M.D.   On: 03/16/2024 10:40   CT Head Wo Contrast Result Date: 03/15/2024 EXAM: CT HEAD WITHOUT CONTRAST 03/15/2024 05:52:47 PM TECHNIQUE: CT of the head was performed without the administration of intravenous contrast. Automated exposure control, iterative reconstruction, and/or weight based adjustment of the mA/kV was utilized to reduce the radiation dose to as low as reasonably achievable. COMPARISON: 02/18/2024 CLINICAL HISTORY: Headache, tension-type FINDINGS: BRAIN AND VENTRICLES: No acute hemorrhage. No evidence of acute infarct. No hydrocephalus. No extra-axial collection. No mass effect or midline shift. Similar position of right frontal approach shunt catheter with tip in the frontal horn of the left lateral ventricle. Similar encephalomalacia along the shunt catheter tract in right frontal lobe. Unchanged ventricular caliber. No hyperdense vessel. Calcific intracranial atherosclerosis. ORBITS: No acute abnormality. SINUSES: No acute abnormality. SOFT TISSUES AND SKULL: No acute soft tissue abnormality. No acute fracture. IMPRESSION: 1. No acute intracranial abnormality. 2. Similar position of right frontal approach shunt catheter. Similar ventricular caliber. Electronically signed by: Norman Gatlin MD 03/15/2024 06:20 PM EST RP Workstation: [id number]   DG Chest 2 View Result Date: 03/15/2024 CLINICAL DATA:   Chest pressure, shortness of breath, dizziness. EXAM: CHEST - 2 VIEW COMPARISON:  02/12/2024 and CT chest 03/07/2022. FINDINGS: Patient is slightly rotated. Trachea is midline. Heart is enlarged. Shunt catheters project over the right chest. Minimal right base atelectasis. Lungs are otherwise clear. No pleural fluid. IMPRESSION: Minimal right base atelectasis. Electronically Signed   By: Newell Eke M.D.   On: 03/15/2024 18:04   CT HEAD WO CONTRAST ( ) Result Date: 02/18/2024 EXAM: CT HEAD WITHOUT CONTRAST 02/18/2024 10:29:08 AM TECHNIQUE: CT of the head was performed without the administration of intravenous contrast. Automated exposure control, iterative reconstruction, and/or weight based adjustment of the mA/kV was utilized to reduce the radiation dose to as low as reasonably achievable. COMPARISON: head CT 04/18/2023 CLINICAL HISTORY: headache, shunt. FINDINGS: BRAIN AND VENTRICLES: No acute hemorrhage. No evidence of acute infarct. No hydrocephalus. No extra-axial collection. No mass effect or midline shift. Unchanged Right frontal approach shunt catheter with tip in the frontal horn of right lateral ventricle. Unchanged encephalomalacia along the shunt catheter tract. Stable  size of the shunted ventricles. ORBITS: No acute abnormality. SINUSES: No acute abnormality. SOFT TISSUES AND SKULL: No acute soft tissue abnormality. No skull fracture. Right frontal burr hole. IMPRESSION: 1. No acute intracranial abnormality. 2. Stable size of the shunted ventricles. Electronically signed by: Ryan Chess MD 02/18/2024 10:42 AM EDT RP Workstation: [id number]    Labs: CBC: Recent Labs  Lab 03/15/24 1406 03/16/24 0432  WBC 9.0 8.0  HGB 15.5* 13.8  HCT 46.4* 42.2  MCV 85.8 86.7  PLT 429* 354   Basic Metabolic Panel: Recent Labs  Lab 03/15/24 1406 03/16/24 0432  NA 143 141  K 4.3 3.7  CL 105 109  CO2 26 22  GLUCOSE 129* 120*  BUN 14 14  CREATININE 1.03* 0.78  CALCIUM  9.2 8.4*    Liver Function Tests: Recent Labs  Lab 03/15/24 1406  AST 32  ALT 56*  ALKPHOS 85  BILITOT 0.7  PROT 7.6  ALBUMIN 3.8   CBG: No results for input(s): GLUCAP in the last 168 hours.  Discharge time spent: greater than 30 minutes.  Signed: Charlie Patterson, MD [facility] 03/16/2024

## 2024-03-16 NOTE — Assessment & Plan Note (Signed)
 Follow up as outpatient

## 2024-03-16 NOTE — ED Notes (Signed)
 Patient ambulated per MD request.   Patient's heart rate 94 bpm prior to ambulation. Increased to 102-108 during ambulation. Decreased to 107 immediately post ambulation. Decreased to 93 10 minutes post ambulation.   Patient's blood pressure 124/85 prior to ambulation. Increased to 167/70 immediately post ambulation. Decreased to 115/80 10 minutes post ambulation.   Patient's respiratory rate 19-24 prior to ambulation. Increased to 44-55 during ambulation. Decreased to 20 immediately post ambulation. Decreased to 18 10 minutes post ambulation.   Patient's pulse oximetry level never dropped below 96 pre/post or during ambulation.

## 2024-03-16 NOTE — Assessment & Plan Note (Signed)
 Cardiac enzymes x 3 negative.

## 2024-03-16 NOTE — Assessment & Plan Note (Signed)
 Patient given IV fluids overnight.  Does not meet criteria for acute kidney injury.

## 2024-03-16 NOTE — Assessment & Plan Note (Signed)
 Could be secondary to deconditioning and lying around for a week.  Patient was short of breath walking around initially but improved throughout the day.  Pulse ox with ambulation normal.  CT scan of the chest negative for pulmonary embolism.

## 2024-03-16 NOTE — ED Notes (Signed)
 MD ambulating patient

## 2024-03-16 NOTE — ED Notes (Signed)
 Patient ambulated with pulse oximetry per MD order.  Patient's pulse oximetry level 100% prior to ambulation on RA. Patient's pulse oximetry level ranged from 95-98 during ambulation. Patient's pulse oximetry level 99 immediately post ambulation (when placed back in bed)..  Patient's respiratory rate 15-19 prior to ambulation. Patient walked 2 labs around the nursing station. Patient's respiratory rate increased 45-57 during first lap of ambulation. Patient's respiratory rate ranged 28-46 second lap of ambulation, predominantly in the mid 30s. Patient denied SOB, c/o hip pain. Denied dizziness/lightheadedness. Patient's respiratory rate 26 immediately post ambulation.   Patient's pulse 81 prior to ambulation. Patient's pulse increased to max 108 during ambulation. Patient's pulse 102 immediately post ambulation.

## 2024-03-16 NOTE — Assessment & Plan Note (Signed)
 Patient stated she does not do well with the Azor.  She was switched back to verapamil .  Blood pressure in a better range for discharge.  Patient not orthostatic.

## 2024-03-16 NOTE — Assessment & Plan Note (Signed)
 1 dose of Toradol  given

## 2024-03-16 NOTE — Hospital Course (Signed)
 68 y.o. female with medical history significant of HTN, HLD, asthma, GERD, depression with anxiety, hydrocephalus (s/p VP shunt), Psoriatec arthritis, migraine headache, IBS, hearing loss, perforated left eardrum, who presents with elevated blood pressure, SOB, chest pain, headache.   Pt states that her Bp  has been elevated recently.  She was taking verapamil  240 mg daily. Her PCP switched her from verapamil  to Azor (amlodipine 10 mg-olmesartan 20 mg) on 03/10/24.  Patient states that after she stopped verapamil  and started taking Azor, she has not been feeling well.  She developed SOB, palpitation, chest pain and pressure, dizziness, headache, with generalized bad feeling.  She has some nausea, no vomiting, diarrhea or abdominal pain.  No symptoms of UTI.  No fever or chills.  She reports severe headache which is located the top of her head, constant, pressure-like, nonradiating, not aggravated or alleviated by any known factors. Pt was instructed to hold Azor and take 120 mg of verapamil  without improvement today.    Pt was found to have elevated blood pressure 184/100, which improved to 141/85 after giving 10 mg of labetalol  in ED.  Her headache has also improved. Her SOB, chest pain and pressure has resolved when I saw pt in ED.   Of note, pt has eardrum perforation in left ear.  Patient is scheduled for surgery on Thursday.  Denies ear pain or ear drainage.  She has severe hearing loss in the left ear.  11/11.  Patient seen on 3 different occasions today.  Patient was placed back on her verapamil  and blood pressures have improved.  She states she did not do well with the amlodipine telmisartan and has been lying around for about a week.  CT scan of the chest negative for pulmonary embolism.  Patient did get a little short of breath with walking around with myself and nursing staff.  In the afternoon on the fourth time walking around she felt better.  She wanted to go home.  Will continue her verapamil   for now.  Blood pressure upon discharge 136/84.  Recommend close clinical follow-up as an outpatient

## 2024-03-16 NOTE — ED Notes (Signed)
Hospital Provider at bedside 

## 2024-03-16 NOTE — ED Notes (Signed)
 Hospital provider at bedside.

## 2024-03-16 NOTE — Assessment & Plan Note (Signed)
--   Continue chronic medications

## 2024-03-16 NOTE — ED Notes (Signed)
 Patient ambulated per MD request.   Patient's respirations 19-24 prior to amublation. Increased to 45-55 during ambulation. Decreased to 20 immediately post ambulation. Pulse oximetery level never decreased below 96% on RA.  Patients HR 94 prior to ambulation. Increased to

## 2024-03-16 NOTE — Assessment & Plan Note (Signed)
 Class II with BMI 35.01

## 2024-03-16 NOTE — ED Notes (Signed)
 Up to toilet. Ambulated w/steady gait.

## 2024-03-16 NOTE — ED Notes (Signed)
 Meal tray provided by dining. Patient eating meal.

## 2024-03-23 ENCOUNTER — Ambulatory Visit (INDEPENDENT_AMBULATORY_CARE_PROVIDER_SITE_OTHER): Admitting: Internal Medicine

## 2024-03-23 ENCOUNTER — Other Ambulatory Visit: Payer: Self-pay | Admitting: Internal Medicine

## 2024-03-23 ENCOUNTER — Encounter: Payer: Self-pay | Admitting: Internal Medicine

## 2024-03-23 VITALS — BP 138/82 | Ht 66.0 in | Wt 218.8 lb

## 2024-03-23 DIAGNOSIS — D354 Benign neoplasm of pineal gland: Secondary | ICD-10-CM | POA: Insufficient documentation

## 2024-03-23 DIAGNOSIS — R519 Headache, unspecified: Secondary | ICD-10-CM

## 2024-03-23 DIAGNOSIS — L4050 Arthropathic psoriasis, unspecified: Secondary | ICD-10-CM | POA: Diagnosis not present

## 2024-03-23 DIAGNOSIS — I160 Hypertensive urgency: Secondary | ICD-10-CM

## 2024-03-23 DIAGNOSIS — E860 Dehydration: Secondary | ICD-10-CM

## 2024-03-23 NOTE — Patient Instructions (Signed)

## 2024-03-23 NOTE — Progress Notes (Signed)
 Subjective:    Patient ID: Michele Newton, female    DOB: 1955-05-12, 68 y.o.   MRN: [id number]  HPI  Discussed the use of AI scribe software for clinical note transcription with the patient, who gave verbal consent to proceed.  Michele Newton is a 68 year old female with hypertension who presents for follow-up after an ER visit due to medication side effects.  She was seen in the emergency room on March 15, 2024, due to headaches, elevated blood pressure, shortness of breath, palpitations and chest pressure following a change in her blood pressure medication. She had been switched from verapamil  240 mg to amlodipine and olmesartan 10/20 mg, which led to these symptoms.  In the ER, her creatinine was slightly elevated, and GFR at 59. Her hemoglobin and hematocrit were also elevated. A comprehensive workup including troponins, urinalysis, drug screen, CT angiography of the chest, CT of the head, chest x-ray, and EKG were all normal.  She was given IV fluids for presumed dehydration.  She was taken off the Tapin-olmesartan and resumed verapamil  240 mg daily.  Her blood pressure is currently measuring 138/82 mmHg since resuming verapamil . She has verapamil  at home and did not receive a new prescription from the ER.    Review of Systems     Past Medical History:  Diagnosis Date   Allergy    Anxiety    Arthritis    Asthma    Depression    Frequent headaches    GERD (gastroesophageal reflux disease)    Hydrocephalus (HCC) 1995   Hypertension    Migraine    PONV (postoperative nausea and vomiting)    Psoriatic arthritis (HCC)     Current Outpatient Medications  Medication Sig Dispense Refill   butalbital-acetaminophen -caffeine (FIORICET) 50-325-40 MG tablet Take 1 tab at headache onset. Take another tablet in 2 hours if needed. Take no more than 2 tablets in a 24 hour period.     fluticasone  (FLONASE ) 50 MCG/ACT nasal spray SMARTSIG:1-2 Spray(s) Both Nares Daily     montelukast   (SINGULAIR ) 10 MG tablet TAKE 1 TABLET(10 MG) BY MOUTH AT BEDTIME 90 tablet 0   pantoprazole  (PROTONIX ) 40 MG tablet Take 1 tablet (40 mg total) by mouth 2 (two) times daily. 180 tablet 0   QUEtiapine  (SEROQUEL ) 200 MG tablet Take 1 tablet (200 mg total) by mouth at bedtime. 90 tablet 1   rizatriptan  (MAXALT -MLT) 10 MG disintegrating tablet DISSOLVE 1 TABLET ON THE TONGUE AT ONSET OF MIGRAINE HEADACHE. MAY REPEAT IN 2 HOURS AS NEEDED. MAX 2 IN 24 HOURS 10 tablet 0   traZODone  (DESYREL ) 50 MG tablet Take 1 tablet (50 mg total) by mouth at bedtime. 90 tablet 1   verapamil  (CALAN -SR) 240 MG CR tablet Take 1 tablet (240 mg total) by mouth daily.     No current facility-administered medications for this visit.    Allergies  Allergen Reactions   Amitriptyline Other (See Comments)    hallucinations   Effexor [Venlafaxine] Other (See Comments)    Amnesia, hospitalized x 2 weeks   Haldol [Haloperidol] Other (See Comments)    Paralyzed vocal cords     Family History  Problem Relation Age of Onset   Arthritis Mother    Hyperlipidemia Mother    Heart disease Mother 52       Died of heart failure   Hypertension Mother    Diabetes Mother    Depression Mother    Obesity Mother    Arthritis Father  Cancer Father        prostate cancer   Diabetes Brother    Breast cancer Maternal Grandmother    Diabetes Sister    Colon cancer Neg Hx     Social History   Socioeconomic History   Marital status: Married    Spouse name: Arley   Number of children: 2   Years of education: 14   Highest education level: Some college, no degree  Occupational History    Employer: OTHER  Tobacco Use   Smoking status: Never   Smokeless tobacco: Never  Vaping Use   Vaping status: Never Used  Substance and Sexual Activity   Alcohol use: No   Drug use: No   Sexual activity: Not Currently    Birth control/protection: Post-menopausal  Other Topics Concern   Not on file  Social History Narrative   [place]ani  lives at home with her husband, Arley, of 5 months and a cat named, Puff. She has 2 sons from a previous marriage. She has 4 grandchildren.    Social Drivers of Corporate Investment Banker Strain: Low Risk  (11/13/2023)   Overall Financial Resource Strain (CARDIA)    Difficulty of Paying Living Expenses: Not hard at all  Recent Concern: Financial Resource Strain - Medium Risk (11/12/2023)   Overall Financial Resource Strain (CARDIA)    Difficulty of Paying Living Expenses: Somewhat hard  Food Insecurity: No Food Insecurity (11/13/2023)   Hunger Vital Sign    Worried About Running Out of Food in the Last Year: Never true    Ran Out of Food in the Last Year: Never true  Transportation Needs: No Transportation Needs (11/13/2023)   PRAPARE - Administrator, Civil Service (Medical): No    Lack of Transportation (Non-Medical): No  Physical Activity: Inactive (11/13/2023)   Exercise Vital Sign    Days of Exercise per Week: 0 days    Minutes of Exercise per Session: 0 min  Stress: No Stress Concern Present (11/13/2023)   [employer] of Occupational Health - Occupational Stress Questionnaire    Feeling of Stress: Not at all  Social Connections: Socially Integrated (11/13/2023)   Social Connection and Isolation Panel    Frequency of Communication with Friends and Family: More than three times a week    Frequency of Social Gatherings with Friends and Family: More than three times a week    Attends Religious Services: More than 4 times per year    Active Member of [employer] or Organizations: Yes    Attends Engineer, Structural: More than 4 times per year    Marital Status: Married  Catering Manager Violence: Not At Risk (11/13/2023)   Humiliation, Afraid, Rape, and Kick questionnaire    Fear of Current or Ex-Partner: No    Emotionally Abused: No    Physically Abused: No    Sexually Abused: No     Constitutional: Denies fever, malaise, fatigue, headache or abrupt weight  changes.  HEENT: Denies eye pain, eye redness, ear pain, ringing in the ears, wax buildup, runny nose, nasal congestion, bloody nose, or sore throat. Respiratory: Denies difficulty breathing, shortness of breath, cough or sputum production.   Cardiovascular: Denies chest pain, chest tightness, palpitations or swelling in the hands or feet.  Gastrointestinal: Patient reports alternating constipation and diarrhea.  Denies abdominal pain, bloating, or blood in the stool.  GU:  Denies urgency, frequency, pain with urination, burning with urination, blood in urine, odor or discharge. Musculoskeletal: Patient reports  chronic back pain, left hip pain.  Denies decrease in range of motion, difficulty with gait, muscle pain or joint swelling.  Skin: Denies redness, rashes, lesions or ulcercations.  Neurological: Patient reports insomnia.  Denies dizziness, difficulty with memory, difficulty with speech or problems with balance and coordination.  Psych: Patient has a history of anxiety and depression.  Denies SI/HI.  No other specific complaints in a complete review of systems (except as listed in HPI above).  Objective:   Physical Exam  There were no vitals taken for this visit.   Wt Readings from Last 3 Encounters:  03/15/24 216 lb 14.9 oz (98.4 kg)  03/10/24 217 lb (98.4 kg)  02/24/24 213 lb 12.8 oz (97 kg)    General: Appears her stated age, obese, in NAD. Skin: Warm, dry and intact.  HEENT: Head: normal shape and size; Eyes: sclera white, no icterus, conjunctiva pink, PERRLA and EOMs intact;  Cardiovascular: Normal rate and rhythm. S1,S2 noted.  No murmur, rubs or gallops noted. No JVD or BLE edema. Pulmonary/Chest: Normal effort and positive vesicular breath sounds. No respiratory distress. No wheezes, rales or ronchi noted.  Neurological: Alert and oriented. Coordination normal.    BMET    Component Value Date/Time   NA 141 03/16/2024 0432   K 3.7 03/16/2024 0432   CL 109 03/16/2024  0432   CO2 22 03/16/2024 0432   GLUCOSE 120 (H) 03/16/2024 0432   BUN 14 03/16/2024 0432   CREATININE 0.78 03/16/2024 0432   CREATININE 0.96 01/13/2024 0919   CALCIUM  8.4 (L) 03/16/2024 0432   GFRNONAA >60 03/16/2024 0432   GFRNONAA 69 10/09/2017 1024   GFRAA >60 11/20/2018 0408   GFRAA 79 10/09/2017 1024    Lipid Panel     Component Value Date/Time   CHOL 272 (H) 01/13/2024 0919   TRIG 112 01/13/2024 0919   HDL 53 01/13/2024 0919   CHOLHDL 5.1 (H) 01/13/2024 0919   VLDL 33.0 02/20/2017 0841   LDLCALC 195 (H) 01/13/2024 0919    CBC    Component Value Date/Time   WBC 8.0 03/16/2024 0432   RBC 4.87 03/16/2024 0432   HGB 13.8 03/16/2024 0432   HCT 42.2 03/16/2024 0432   PLT 354 03/16/2024 0432   MCV 86.7 03/16/2024 0432   MCH 28.3 03/16/2024 0432   MCHC 32.7 03/16/2024 0432   RDW 13.5 03/16/2024 0432   LYMPHSABS 2.2 07/18/2020 1054   MONOABS 0.8 07/18/2020 1054   EOSABS 0.1 07/18/2020 1054   BASOSABS 0.1 07/18/2020 1054    Hgb A1C Lab Results  Component Value Date   HGBA1C 5.8 (H) 01/13/2024           Assessment & Plan:       Assessment and Plan    ER follow-up for hypertensive urgency, palpitations, dehydration ER notes, labs and imaging reviewed recent hypertensive urgency with improved control on verapamil . Previous medications may have contributed to symptoms. - Continue verapamil  240 mg for blood pressure management. - Follow-up in four months.  Dehydration Contributed to elevated creatinine and hemoglobin levels.  Headache Improved with blood pressure control.         RTC in 4 months, follow-up chronic conditions Angeline Laura, NP

## 2024-03-25 ENCOUNTER — Other Ambulatory Visit: Payer: Self-pay | Admitting: Internal Medicine

## 2024-03-25 ENCOUNTER — Encounter (INDEPENDENT_AMBULATORY_CARE_PROVIDER_SITE_OTHER): Admitting: Otolaryngology

## 2024-03-25 DIAGNOSIS — K219 Gastro-esophageal reflux disease without esophagitis: Secondary | ICD-10-CM

## 2024-03-25 NOTE — Telephone Encounter (Signed)
 Requested medication (s) are due for refill today: Yes  Requested medication (s) are on the active medication list: Yes  Last refill:  03/16/24  Future visit scheduled: Yes  Notes to clinic:  Unable to refill per protocol, last refill by another provider.      Requested Prescriptions  Pending Prescriptions Disp Refills   verapamil  (CALAN -SR) 240 MG CR tablet [Pharmacy Med Name: VERAPAMIL  ER 240MG  TABLETS] 30 tablet     Sig: TAKE 1 TABLET(240 MG) BY MOUTH AT BEDTIME     Cardiovascular: Calcium  Channel Blockers 3 Failed - 03/25/2024  3:41 PM      Failed - ALT in normal range and within 360 days    ALT  Date Value Ref Range Status  03/15/2024 56 (H) 0 - 44 U/L Final         Passed - AST in normal range and within 360 days    AST  Date Value Ref Range Status  03/15/2024 32 15 - 41 U/L Final         Passed - Cr in normal range and within 360 days    Creat  Date Value Ref Range Status  01/13/2024 0.96 0.50 - 1.05 mg/dL Final   Creatinine, Ser  Date Value Ref Range Status  03/16/2024 0.78 0.44 - 1.00 mg/dL Final         Passed - Last BP in normal range    BP Readings from Last 1 Encounters:  03/23/24 138/82         Passed - Last Heart Rate in normal range    Pulse Readings from Last 1 Encounters:  03/16/24 (!) 57         Passed - Valid encounter within last 6 months    Recent Outpatient Visits           2 days ago Hypertensive urgency   [place] [facility] [place], Angeline ORN, NP   2 weeks ago Essential hypertension   [place] [facility] [place], Angeline ORN, NP   1 month ago Burning with urination   [place] [facility] [place], Angeline ORN, NP   2 months ago Encounter for general adult medical examination with abnormal findings   [place] [facility] [place], Angeline ORN, NP   2 months ago Viral URI   [place] [facility] [place], Angeline ORN, [place]

## 2024-03-26 NOTE — Telephone Encounter (Signed)
 Requested Prescriptions  Pending Prescriptions Disp Refills   pantoprazole  (PROTONIX ) 40 MG tablet [Pharmacy Med Name: PANTOPRAZOLE  40MG  TABLETS] 180 tablet 3    Sig: TAKE 1 TABLET(40 MG) BY MOUTH TWICE DAILY     Gastroenterology: Proton Pump Inhibitors Passed - 03/26/2024  5:02 PM      Passed - Valid encounter within last 12 months    Recent Outpatient Visits           3 days ago Hypertensive urgency   [place] [facility] [place], Angeline ORN, NP   2 weeks ago Essential hypertension   [place] [facility] [place], Angeline ORN, NP   1 month ago Burning with urination   [place] [facility] [place], Angeline ORN, NP   2 months ago Encounter for general adult medical examination with abnormal findings   [place] [facility] [place], Angeline ORN, NP   2 months ago Viral URI   [place] [facility] [place], Angeline ORN, [place]

## 2024-04-07 ENCOUNTER — Other Ambulatory Visit: Payer: Self-pay | Admitting: Internal Medicine

## 2024-04-09 ENCOUNTER — Telehealth: Payer: Self-pay

## 2024-04-09 MED ORDER — VERAPAMIL HCL ER 180 MG PO TBCR: 180.0000 mg | EXTENDED_RELEASE_TABLET | Freq: Every day | ORAL | 1 refills | Status: AC

## 2024-04-09 NOTE — Telephone Encounter (Signed)
 Requested Prescriptions  Refused Prescriptions Disp Refills   verapamil  (CALAN -SR) 180 MG CR tablet [Pharmacy Med Name: VERAPAMIL  ER 180MG  TABLETS] 90 tablet 0    Sig: TAKE 1 TABLET(180 MG) BY MOUTH DAILY     Cardiovascular: Calcium  Channel Blockers 3 Failed - 04/09/2024  2:40 PM      Failed - ALT in normal range and within 360 days    ALT  Date Value Ref Range Status  03/15/2024 56 (H) 0 - 44 U/L Final         Passed - AST in normal range and within 360 days    AST  Date Value Ref Range Status  03/15/2024 32 15 - 41 U/L Final         Passed - Cr in normal range and within 360 days    Creat  Date Value Ref Range Status  01/13/2024 0.96 0.50 - 1.05 mg/dL Final   Creatinine, Ser  Date Value Ref Range Status  03/16/2024 0.78 0.44 - 1.00 mg/dL Final         Passed - Last BP in normal range    BP Readings from Last 1 Encounters:  03/23/24 138/82         Passed - Last Heart Rate in normal range    Pulse Readings from Last 1 Encounters:  03/16/24 (!) 57         Passed - Valid encounter within last 6 months    Recent Outpatient Visits           2 weeks ago Hypertensive urgency   [place] [facility] [place], Angeline ORN, NP   1 month ago Essential hypertension   [place] [facility] [place], Angeline ORN, NP   1 month ago Burning with urination   [place] [facility] [place], Angeline ORN, NP   2 months ago Encounter for general adult medical examination with abnormal findings   [place] [facility] [place], Angeline ORN, NP   3 months ago Viral URI   [place] [facility] [place], Angeline ORN, [place]

## 2024-04-09 NOTE — Telephone Encounter (Signed)
 Refill request

## 2024-04-09 NOTE — Telephone Encounter (Signed)
 Verapamil  180 mg sent to pharmacy

## 2024-04-09 NOTE — Addendum Note (Signed)
 Addended by: ANTONETTE ANGELINE ORN on: 04/09/2024 09:46 AM   Modules accepted: Orders

## 2024-04-22 NOTE — Progress Notes (Unsigned)
 Virtual Visit via Video Note  I connected with Shanica Castellanos on 04/22/2024 at 11:00 AM EST by a video enabled telemedicine application and verified that I am speaking with the correct person using two identifiers.  Location: Patient: Home Provider: Office  Person's participating in this video call: Angeline Laura, NP-C and Kiya Eno   I discussed the limitations of evaluation and management by telemedicine and the availability of in person appointments. The patient expressed understanding and agreed to proceed.  History of Present Illness:   Discussed the use of AI scribe software for clinical note transcription with the patient, who gave verbal consent to proceed.  Michele Newton is a 68 year old female who presents with symptoms of cystitis.  She has been experiencing symptoms of cystitis for the past three days, including burning with urination, urgency and urinary frequency. She has been using Azo, which has provided some relief.  No abdominal pain, nausea, vomiting, fever, or chills. Her past urinalysis results include a negative urine test on November 10th and October 21st, while a urine culture from September 9th showed growth of Klebsiella.  She has not submitted a recent urine sample due to her current use of Azo, which she believed might affect the test results.      Past Medical History:  Diagnosis Date   Allergy    Anxiety    Arthritis    Asthma    Depression    Frequent headaches    GERD (gastroesophageal reflux disease)    Hydrocephalus (HCC) 1995   Hypertension    Migraine    PONV (postoperative nausea and vomiting)    Psoriatic arthritis (HCC)     Current Outpatient Medications  Medication Sig Dispense Refill   butalbital -acetaminophen -caffeine  (FIORICET) 50-325-40 MG tablet Take 1 tab at headache onset. Take another tablet in 2 hours if needed. Take no more than 2 tablets in a 24 hour period.     fluticasone  (FLONASE ) 50 MCG/ACT nasal spray SMARTSIG:1-2  Spray(s) Both Nares Daily     montelukast  (SINGULAIR ) 10 MG tablet TAKE 1 TABLET(10 MG) BY MOUTH AT BEDTIME 90 tablet 0   pantoprazole  (PROTONIX ) 40 MG tablet TAKE 1 TABLET(40 MG) BY MOUTH TWICE DAILY 180 tablet 3   PARoxetine  (PAXIL ) 40 MG tablet Take 40 mg by mouth every morning.     QUEtiapine  (SEROQUEL ) 200 MG tablet Take 1 tablet (200 mg total) by mouth at bedtime. 90 tablet 1   rizatriptan  (MAXALT -MLT) 10 MG disintegrating tablet DISSOLVE 1 TABLET ON THE TONGUE AT ONSET OF MIGRAINE HEADACHE. MAY REPEAT IN 2 HOURS AS NEEDED. MAX 2 IN 24 HOURS 10 tablet 0   traZODone  (DESYREL ) 50 MG tablet Take 1 tablet (50 mg total) by mouth at bedtime. 90 tablet 1   verapamil  (CALAN -SR) 180 MG CR tablet Take 1 tablet (180 mg total) by mouth daily. 90 tablet 1   No current facility-administered medications for this visit.    Allergies[1]  Family History  Problem Relation Age of Onset   Arthritis Mother    Hyperlipidemia Mother    Heart disease Mother 95       Died of heart failure   Hypertension Mother    Diabetes Mother    Depression Mother    Obesity Mother    Arthritis Father    Cancer Father        prostate cancer   Diabetes Brother    Breast cancer Maternal Grandmother    Diabetes Sister    Colon cancer Neg Hx  Social History   Socioeconomic History   Marital status: Married    Spouse name: Arley   Number of children: 2   Years of education: 14   Highest education level: Some college, no degree  Occupational History    Employer: OTHER  Tobacco Use   Smoking status: Never   Smokeless tobacco: Never  Vaping Use   Vaping status: Never Used  Substance and Sexual Activity   Alcohol use: No   Drug use: No   Sexual activity: Not Currently    Birth control/protection: Post-menopausal  Other Topics Concern   Not on file  Social History Narrative   Mariene lives at home with her husband, Arley, of 5 months and a cat named, Puff. She has 2 sons from a previous marriage. She has 4  grandchildren.    Social Drivers of Health   Tobacco Use: Low Risk (03/23/2024)   Patient History    Smoking Tobacco Use: Never    Smokeless Tobacco Use: Never    Passive Exposure: Not on file  Financial Resource Strain: Low Risk (11/13/2023)   Overall Financial Resource Strain (CARDIA)    Difficulty of Paying Living Expenses: Not hard at all  Recent Concern: Financial Resource Strain - Medium Risk (11/12/2023)   Overall Financial Resource Strain (CARDIA)    Difficulty of Paying Living Expenses: Somewhat hard  Food Insecurity: No Food Insecurity (11/13/2023)   Epic    Worried About Radiation Protection Practitioner of Food in the Last Year: Never true    Ran Out of Food in the Last Year: Never true  Transportation Needs: No Transportation Needs (11/13/2023)   Epic    Lack of Transportation (Medical): No    Lack of Transportation (Non-Medical): No  Physical Activity: Inactive (11/13/2023)   Exercise Vital Sign    Days of Exercise per Week: 0 days    Minutes of Exercise per Session: 0 min  Stress: No Stress Concern Present (11/13/2023)   [employer] of Occupational Health - Occupational Stress Questionnaire    Feeling of Stress: Not at all  Social Connections: Socially Integrated (11/13/2023)   Social Connection and Isolation Panel    Frequency of Communication with Friends and Family: More than three times a week    Frequency of Social Gatherings with Friends and Family: More than three times a week    Attends Religious Services: More than 4 times per year    Active Member of Clubs or Organizations: Yes    Attends Banker Meetings: More than 4 times per year    Marital Status: Married  Catering Manager Violence: Not At Risk (11/13/2023)   Epic    Fear of Current or Ex-Partner: No    Emotionally Abused: No    Physically Abused: No    Sexually Abused: No  Depression (PHQ2-9): Low Risk (03/23/2024)   Depression (PHQ2-9)    PHQ-2 Score: 0  Alcohol Screen: Low Risk (02/23/2024)    Alcohol Screen    Last Alcohol Screening Score (AUDIT): 1  Housing: Unknown (02/23/2024)   Epic    Unable to Pay for Housing in the Last Year: No    Number of Times Moved in the Last Year: Not on file    Homeless in the Last Year: No  Utilities: Not At Risk (11/13/2023)   Epic    Threatened with loss of utilities: No  Health Literacy: Adequate Health Literacy (11/13/2023)   B1300 Health Literacy    Frequency of need for help with medical instructions: Never  Constitutional: Patient reports intermittent headaches.  Denies fever, malaise, fatigue, or abrupt weight changes.  Respiratory: Denies difficulty breathing, shortness of breath, cough or sputum production.   Cardiovascular: Denies chest pain, chest tightness, palpitations or swelling in the hands or feet.  Gastrointestinal: Denies abdominal pain, bloating, constipation, diarrhea or blood in the stool.  GU: Pt reports urinary urgency, frequency and burning with urination. Denies pain with urination, blood in urine, odor or discharge.  No other specific complaints in a complete review of systems (except as listed in HPI above).  Observations/Objective:  Wt Readings from Last 3 Encounters:  03/23/24 218 lb 12.8 oz (99.2 kg)  03/15/24 216 lb 14.9 oz (98.4 kg)  03/10/24 217 lb (98.4 kg)    General: Appears her stated age, obese, in NAD. Pulmonary/Chest: Normal effort. No respiratory distress.  Neurological: Alert and oriented.    BMET    Component Value Date/Time   NA 141 03/16/2024 0432   K 3.7 03/16/2024 0432   CL 109 03/16/2024 0432   CO2 22 03/16/2024 0432   GLUCOSE 120 (H) 03/16/2024 0432   BUN 14 03/16/2024 0432   CREATININE 0.78 03/16/2024 0432   CREATININE 0.96 01/13/2024 0919   CALCIUM  8.4 (L) 03/16/2024 0432   GFRNONAA >60 03/16/2024 0432   GFRNONAA 69 10/09/2017 1024   GFRAA >60 11/20/2018 0408   GFRAA 79 10/09/2017 1024    Lipid Panel     Component Value Date/Time   CHOL 272 (H) 01/13/2024 0919    TRIG 112 01/13/2024 0919   HDL 53 01/13/2024 0919   CHOLHDL 5.1 (H) 01/13/2024 0919   VLDL 33.0 02/20/2017 0841   LDLCALC 195 (H) 01/13/2024 0919    CBC    Component Value Date/Time   WBC 8.0 03/16/2024 0432   RBC 4.87 03/16/2024 0432   HGB 13.8 03/16/2024 0432   HCT 42.2 03/16/2024 0432   PLT 354 03/16/2024 0432   MCV 86.7 03/16/2024 0432   MCH 28.3 03/16/2024 0432   MCHC 32.7 03/16/2024 0432   RDW 13.5 03/16/2024 0432   LYMPHSABS 2.2 07/18/2020 1054   MONOABS 0.8 07/18/2020 1054   EOSABS 0.1 07/18/2020 1054   BASOSABS 0.1 07/18/2020 1054    Hgb A1C Lab Results  Component Value Date   HGBA1C 5.8 (H) 01/13/2024       Assessment and Plan:  Assessment and Plan    Acute cystitis Current symptoms suggestive of urinary tract infection. Previous cultures showed Klebsiella resistant to Macrobid  and amoxicillin . - Prescribed Septra  400-80 mg twice daily for 5 days. - Advised continuation of over-the-counter AZO as needed. - Encouraged increased fluid intake. - Instructed to report if symptoms do not improve for further evaluation with urine culture and urinalysis.       RTC in 3 months for follow-up of chronic conditions Follow Up Instructions:    I discussed the assessment and treatment plan with the patient. The patient was provided an opportunity to ask questions and all were answered. The patient agreed with the plan and demonstrated an understanding of the instructions.   The patient was advised to call back or seek an in-person evaluation if the symptoms worsen or if the condition fails to improve as anticipated.   Angeline Laura, NP     [1]  Allergies Allergen Reactions   Amitriptyline Other (See Comments)    hallucinations   Effexor [Venlafaxine] Other (See Comments)    Amnesia, hospitalized x 2 weeks   Haldol [Haloperidol] Other (See Comments)  Paralyzed vocal cords

## 2024-04-23 ENCOUNTER — Telehealth: Admitting: Internal Medicine

## 2024-04-23 ENCOUNTER — Encounter: Payer: Self-pay | Admitting: Internal Medicine

## 2024-04-23 DIAGNOSIS — R3915 Urgency of urination: Secondary | ICD-10-CM

## 2024-04-23 DIAGNOSIS — R350 Frequency of micturition: Secondary | ICD-10-CM

## 2024-04-23 DIAGNOSIS — R300 Dysuria: Secondary | ICD-10-CM | POA: Diagnosis not present

## 2024-04-23 MED ORDER — SULFAMETHOXAZOLE-TRIMETHOPRIM 400-80 MG PO TABS: 1.0000 | ORAL_TABLET | Freq: Two times a day (BID) | ORAL | 0 refills | Status: AC

## 2024-04-23 NOTE — Patient Instructions (Signed)

## 2024-05-03 ENCOUNTER — Encounter (HOSPITAL_BASED_OUTPATIENT_CLINIC_OR_DEPARTMENT_OTHER): Payer: Self-pay | Admitting: Otolaryngology

## 2024-05-03 ENCOUNTER — Other Ambulatory Visit: Payer: Self-pay

## 2024-05-10 ENCOUNTER — Ambulatory Visit (HOSPITAL_BASED_OUTPATIENT_CLINIC_OR_DEPARTMENT_OTHER)

## 2024-05-10 ENCOUNTER — Encounter (HOSPITAL_BASED_OUTPATIENT_CLINIC_OR_DEPARTMENT_OTHER)
Admission: RE | Disposition: A | Discharge status: Home / Self Care | Payer: Self-pay | Source: Home / Self Care | Attending: Otolaryngology

## 2024-05-10 ENCOUNTER — Encounter (HOSPITAL_BASED_OUTPATIENT_CLINIC_OR_DEPARTMENT_OTHER): Payer: Self-pay | Admitting: Otolaryngology

## 2024-05-10 ENCOUNTER — Telehealth (INDEPENDENT_AMBULATORY_CARE_PROVIDER_SITE_OTHER): Payer: Self-pay

## 2024-05-10 ENCOUNTER — Other Ambulatory Visit: Payer: Self-pay

## 2024-05-10 ENCOUNTER — Ambulatory Visit (HOSPITAL_BASED_OUTPATIENT_CLINIC_OR_DEPARTMENT_OTHER)
Admission: RE | Admit: 2024-05-10 | Discharge: 2024-05-10 | Disposition: A | Discharge status: Home / Self Care | Attending: Otolaryngology | Admitting: Otolaryngology

## 2024-05-10 DIAGNOSIS — H7293 Unspecified perforation of tympanic membrane, bilateral: Secondary | ICD-10-CM | POA: Diagnosis not present

## 2024-05-10 DIAGNOSIS — H9041 Sensorineural hearing loss, unilateral, right ear, with unrestricted hearing on the contralateral side: Secondary | ICD-10-CM | POA: Diagnosis not present

## 2024-05-10 DIAGNOSIS — Z982 Presence of cerebrospinal fluid drainage device: Secondary | ICD-10-CM | POA: Diagnosis not present

## 2024-05-10 DIAGNOSIS — G919 Hydrocephalus, unspecified: Secondary | ICD-10-CM | POA: Insufficient documentation

## 2024-05-10 DIAGNOSIS — Z974 Presence of external hearing-aid: Secondary | ICD-10-CM | POA: Insufficient documentation

## 2024-05-10 DIAGNOSIS — I10 Essential (primary) hypertension: Secondary | ICD-10-CM | POA: Insufficient documentation

## 2024-05-10 DIAGNOSIS — H9072 Mixed conductive and sensorineural hearing loss, unilateral, left ear, with unrestricted hearing on the contralateral side: Secondary | ICD-10-CM | POA: Insufficient documentation

## 2024-05-10 DIAGNOSIS — H7202 Central perforation of tympanic membrane, left ear: Secondary | ICD-10-CM

## 2024-05-10 DIAGNOSIS — H7292 Unspecified perforation of tympanic membrane, left ear: Secondary | ICD-10-CM | POA: Diagnosis not present

## 2024-05-10 DIAGNOSIS — J310 Chronic rhinitis: Secondary | ICD-10-CM | POA: Diagnosis not present

## 2024-05-10 DIAGNOSIS — Z01818 Encounter for other preprocedural examination: Secondary | ICD-10-CM

## 2024-05-10 DIAGNOSIS — Z79899 Other long term (current) drug therapy: Secondary | ICD-10-CM | POA: Diagnosis not present

## 2024-05-10 DIAGNOSIS — K219 Gastro-esophageal reflux disease without esophagitis: Secondary | ICD-10-CM | POA: Diagnosis not present

## 2024-05-10 HISTORY — PX: TYMPANOPLASTY WITH GRAFT: SHX6567

## 2024-05-10 SURGERY — TYMPANOPLASTY, USING GRAFT
Anesthesia: General | Site: Ear | Laterality: Left

## 2024-05-10 MED ORDER — CIPROFLOXACIN-DEXAMETHASONE 0.3-0.1 % OT SUSP
OTIC | Status: DC | PRN
  Administered 2024-05-10: 4 [drp] via OTIC

## 2024-05-10 MED ORDER — ACETAMINOPHEN 10 MG/ML IV SOLN
1000.0000 mg | Freq: Once | INTRAVENOUS | Status: DC | PRN
  Administered 2024-05-10: 1000 mg via INTRAVENOUS

## 2024-05-10 MED ORDER — PROPOFOL 10 MG/ML IV BOLUS
INTRAVENOUS | Status: DC | PRN
  Administered 2024-05-10: 140 mg via INTRAVENOUS

## 2024-05-10 MED ORDER — OXYCODONE HCL 5 MG PO TABS
ORAL_TABLET | ORAL | Status: AC
  Filled 2024-05-10: qty 1

## 2024-05-10 MED ORDER — EPHEDRINE SULFATE (PRESSORS) 25 MG/5ML IV SOSY
PREFILLED_SYRINGE | INTRAVENOUS | Status: DC | PRN
  Administered 2024-05-10 (×2): 5 mg via INTRAVENOUS

## 2024-05-10 MED ORDER — OXYCODONE HCL 5 MG/5ML PO SOLN: 5.0000 mg | Freq: Once | ORAL | Status: AC | PRN

## 2024-05-10 MED ORDER — LACTATED RINGERS IV SOLN: INTRAVENOUS | Status: DC

## 2024-05-10 MED ORDER — SUGAMMADEX SODIUM 200 MG/2ML IV SOLN
INTRAVENOUS | Status: DC | PRN
  Administered 2024-05-10: 200 mg via INTRAVENOUS

## 2024-05-10 MED ORDER — LIDOCAINE-EPINEPHRINE 1 %-1:100000 IJ SOLN
INTRAMUSCULAR | Status: DC | PRN
  Administered 2024-05-10: 3.5 mL

## 2024-05-10 MED ORDER — FENTANYL CITRATE (PF) 100 MCG/2ML IJ SOLN
25.0000 ug | INTRAMUSCULAR | Status: DC | PRN
  Administered 2024-05-10: 50 ug via INTRAVENOUS
  Administered 2024-05-10: 25 ug via INTRAVENOUS

## 2024-05-10 MED ORDER — LIDOCAINE-EPINEPHRINE 1 %-1:100000 IJ SOLN
INTRAMUSCULAR | Status: AC
  Filled 2024-05-10: qty 1

## 2024-05-10 MED ORDER — AMISULPRIDE (ANTIEMETIC) 5 MG/2ML IV SOLN: 10.0000 mg | Freq: Once | INTRAVENOUS | Status: DC | PRN

## 2024-05-10 MED ORDER — ONDANSETRON HCL 4 MG/2ML IJ SOLN: 4.0000 mg | Freq: Once | INTRAMUSCULAR | Status: DC | PRN

## 2024-05-10 MED ORDER — FENTANYL CITRATE (PF) 100 MCG/2ML IJ SOLN
INTRAMUSCULAR | Status: AC
  Filled 2024-05-10: qty 2

## 2024-05-10 MED ORDER — OXYCODONE HCL 5 MG PO TABS
5.0000 mg | ORAL_TABLET | Freq: Once | ORAL | Status: AC | PRN
  Administered 2024-05-10: 5 mg via ORAL

## 2024-05-10 MED ORDER — ACETAMINOPHEN 10 MG/ML IV SOLN
INTRAVENOUS | Status: AC
  Filled 2024-05-10: qty 100

## 2024-05-10 MED ORDER — DEXAMETHASONE SOD PHOSPHATE PF 10 MG/ML IJ SOLN
INTRAMUSCULAR | Status: DC | PRN
  Administered 2024-05-10: 10 mg via INTRAVENOUS

## 2024-05-10 MED ORDER — CEFAZOLIN SODIUM-DEXTROSE 2-3 GM-%(50ML) IV SOLR
INTRAVENOUS | Status: DC | PRN
  Administered 2024-05-10: 2 g via INTRAVENOUS

## 2024-05-10 MED ORDER — OXYCODONE-ACETAMINOPHEN 5-325 MG PO TABS: 1.0000 | ORAL_TABLET | ORAL | 0 refills | Status: AC | PRN

## 2024-05-10 MED ORDER — ONDANSETRON HCL 4 MG/2ML IJ SOLN
INTRAMUSCULAR | Status: DC | PRN
  Administered 2024-05-10: 4 mg via INTRAVENOUS

## 2024-05-10 MED ORDER — PROPOFOL 500 MG/50ML IV EMUL
INTRAVENOUS | Status: DC | PRN
  Administered 2024-05-10: 150 ug/kg/min via INTRAVENOUS

## 2024-05-10 MED ORDER — PHENYLEPHRINE HCL (PRESSORS) 10 MG/ML IV SOLN
INTRAVENOUS | Status: DC | PRN
  Administered 2024-05-10 (×3): 80 ug via INTRAVENOUS

## 2024-05-10 MED ORDER — ROCURONIUM BROMIDE 10 MG/ML (PF) SYRINGE
PREFILLED_SYRINGE | INTRAVENOUS | Status: DC | PRN
  Administered 2024-05-10: 70 mg via INTRAVENOUS

## 2024-05-10 MED ORDER — MIDAZOLAM HCL 2 MG/2ML IJ SOLN
INTRAMUSCULAR | Status: AC
  Filled 2024-05-10: qty 2

## 2024-05-10 MED ORDER — BACITRACIN ZINC 500 UNIT/GM EX OINT
TOPICAL_OINTMENT | CUTANEOUS | Status: DC | PRN
  Administered 2024-05-10: 1 via TOPICAL

## 2024-05-10 MED ORDER — EPINEPHRINE PF 1 MG/ML IJ SOLN
INTRAMUSCULAR | Status: AC
  Filled 2024-05-10: qty 1

## 2024-05-10 MED ORDER — DEXMEDETOMIDINE HCL IN NACL 80 MCG/20ML IV SOLN
INTRAVENOUS | Status: DC | PRN
  Administered 2024-05-10: 4 ug via INTRAVENOUS

## 2024-05-10 MED ORDER — LIDOCAINE 2% (20 MG/ML) 5 ML SYRINGE
INTRAMUSCULAR | Status: DC | PRN
  Administered 2024-05-10: 100 mg via INTRAVENOUS

## 2024-05-10 MED ORDER — FENTANYL CITRATE (PF) 100 MCG/2ML IJ SOLN
INTRAMUSCULAR | Status: DC | PRN
  Administered 2024-05-10: 100 ug via INTRAVENOUS

## 2024-05-10 SURGICAL SUPPLY — 39 items
BLADE CLIPPER SURG (BLADE) IMPLANT
BLADE NEEDLE 3 SS STRL (BLADE) IMPLANT
BLADE SURG 15 STRL LF DISP TIS (BLADE) IMPLANT
CANISTER SUCT 1200ML W/VALVE (MISCELLANEOUS) ×1 IMPLANT
CORD BIPOLAR FORCEPS 12FT (ELECTRODE) IMPLANT
COTTONBALL LRG STERILE PKG (GAUZE/BANDAGES/DRESSINGS) ×1 IMPLANT
DERMABOND ADVANCED .7 DNX12 (GAUZE/BANDAGES/DRESSINGS) IMPLANT
DRAPE MICROSCOPE WILD 40.5X102 (DRAPES) ×1 IMPLANT
DRAPE SURG 17X23 STRL (DRAPES) ×1 IMPLANT
DRSG GLASSCOCK MASTOID ADT (GAUZE/BANDAGES/DRESSINGS) IMPLANT
DRSG GLASSCOCK MASTOID PED (GAUZE/BANDAGES/DRESSINGS) IMPLANT
ELECT COATED BLADE 2.86 ST (ELECTRODE) ×1 IMPLANT
ELECTRODE REM PT RTRN 9FT ADLT (ELECTROSURGICAL) ×1 IMPLANT
GAUZE SPONGE 4X4 12PLY STRL (GAUZE/BANDAGES/DRESSINGS) IMPLANT
GAUZE SPONGE 4X4 12PLY STRL LF (GAUZE/BANDAGES/DRESSINGS) IMPLANT
GLOVE BIO SURGEON STRL SZ7.5 (GLOVE) ×1 IMPLANT
GOWN STRL REUS W/ TWL LRG LVL3 (GOWN DISPOSABLE) ×2 IMPLANT
IV CATH AUTO 14GX1.75 SAFE ORG (IV SOLUTION) ×1 IMPLANT
IV NS 500ML BAXH (IV SOLUTION) IMPLANT
IV SET EXT 30 76VOL 4 MALE LL (IV SETS) ×1 IMPLANT
NDL SAFETY ECLIP 18X1.5 (MISCELLANEOUS) ×1 IMPLANT
NEEDLE HYPO 25X1 1.5 SAFETY (NEEDLE) ×1 IMPLANT
PACK BASIN DAY SURGERY FS (CUSTOM PROCEDURE TRAY) ×1 IMPLANT
PACK ENT DAY SURGERY (CUSTOM PROCEDURE TRAY) ×1 IMPLANT
PENCIL SMOKE EVACUATOR (MISCELLANEOUS) ×1 IMPLANT
SLEEVE IRRIGATION ELITE 7 (MISCELLANEOUS) IMPLANT
SLEEVE SCD COMPRESS KNEE MED (STOCKING) IMPLANT
SOLN 0.9% NACL POUR BTL 1000ML (IV SOLUTION) ×1 IMPLANT
SPIKE FLUID TRANSFER (MISCELLANEOUS) ×1 IMPLANT
SPONGE SURGIFOAM ABS GEL 12-7 (HEMOSTASIS) ×1 IMPLANT
SUT VIC AB 3-0 SH 27X BRD (SUTURE) IMPLANT
SUT VIC AB 4-0 P-3 18XBRD (SUTURE) IMPLANT
SUT VIC AB 4-0 PS2 18 (SUTURE) IMPLANT
SYR 3ML 18GX1 1/2 (SYRINGE) ×1 IMPLANT
SYR 5ML LL (SYRINGE) IMPLANT
SYR BULB EAR ULCER 3OZ GRN STR (SYRINGE) IMPLANT
TOWEL GREEN STERILE FF (TOWEL DISPOSABLE) ×1 IMPLANT
TRAY DSU PREP LF (CUSTOM PROCEDURE TRAY) ×1 IMPLANT
TUBING IRRIGATION (MISCELLANEOUS) IMPLANT

## 2024-05-10 NOTE — Discharge Instructions (Addendum)
POSTOPERATIVE INSTRUCTIONS FOR PATIENTS HAVING A MYRINGOPLASTY AND TYMPANOPLASTY Avoid undue fatigue or exposure to colds or upper respiratory infections if possible. Do not blow your nose for approximately one week following surgery. Any accumulated secretions in the nose should be drawn back and expectorated through the mouth to avoid infecting the ear. If you sneeze, do so with your mouth open. Do not hold your nose to avoid sneezing. Do not play musical wind instruments for 3 weeks. Wash your hands with soap and water before treating the ear. A clean cloth moistened with warm water may be used to clean the outer ear as often as necessary for cleanliness and comfort. Do not allow water to enter the ear canal for at least three weeks. You may shampoo your hair 48 hours following surgery, provided that water is not allowed to enter your ear canal. Water can be kept out of your ear canal by placing a cotton ball in the ear opening and applying Vaseline over the cotton to form a water tight seal. If ear drops are to be instilled, position the head with the affected ear up during the instillation and remain in this position for five to ten minutes to facilitate the absorption of the drops. Then place a clean cotton ball in the ear for about an hour. The ear should be exposed to the air as much as possible. A cotton ball should be placed in the ear canal during the day while combing the hair, during exposure to a dusty environment, and at night to prevent drainage onto your pillow. At first, the drainage may be red-brown to brown in color, but the brown drainage usually becomes clear and disappears within a week or two. If drainage increases, call our office, [phone]. If your physician prescribes an antibiotic, fill the prescription promptly and take all of the medicine as directed until the entire supply is gone. If any of the following should occur, contact your physician: Persistent  bleeding Persistent fever Purulent drainage (pus) from the ear or incision Increasing redness around the suture line Persistent pain or dizziness Facial weakness Rash around the ear or incision Do not be overly concerned about your hearing until at least one month postoperatively. Your hearing may fluctuate as the ear heals. You may also experience some popping and cracking sounds in the ear for up to several weeks. It may sound like you are "talking in a barrel" or a tunnel. This is normal and should not cause concern. You may notice a metallic taste in your mouth for several weeks after ear surgery. The taste will usually go away spontaneously. Please ask your surgeon if any of the middle ear ossicles were replaced with metal parts. This may be important to know if you ever need to have a magnetic resonance imaging scan (MRI) in the future. It is important for you to return for your scheduled appointments.  

## 2024-05-10 NOTE — Anesthesia Procedure Notes (Addendum)
 Procedure Name: Intubation Date/Time: 05/10/2024 9:35 AM  Performed by: Leotha Andrez DEL, CRNAPre-anesthesia Checklist: Emergency Drugs available, Patient identified, Suction available, Patient being monitored and Timeout performed Patient Re-evaluated:Patient Re-evaluated prior to induction Oxygen Delivery Method: Circle system utilized Preoxygenation: Pre-oxygenation with 100% oxygen Induction Type: IV induction Ventilation: Mask ventilation without difficulty Laryngoscope Size: 3 and Glidescope Grade View: Grade I Tube type: Oral Tube size: 7.0 mm Number of attempts: 1 Airway Equipment and Method: Stylet Secured at: 22 cm Tube secured with: Tape Dental Injury: Teeth and Oropharynx as per pre-operative assessment  Comments: Elective glidescope go intubation due to patient's documented history of vocal cord paralysis.

## 2024-05-10 NOTE — Telephone Encounter (Signed)
 [facility] when she called into office today at 12:32pm. She stated she is about to let patient go from surgery and patient wants something for pain sent to her pharmacy. I informed Joann to tell patient that she can alternat Ibuprofen and Tylenol  ever 4 hours. She stated that she did but patient still wants something for pain.

## 2024-05-10 NOTE — Transfer of Care (Signed)
 Immediate Anesthesia Transfer of Care Note  Patient: Michele Newton  Procedure(s) Performed: LEFT TYMPANOPLASTY WITH TEMPORALIS FASCIA GRAFT (Left: Ear)  Patient Location: PACU  Anesthesia Type:General  Level of Consciousness: awake, alert , and oriented  Airway & Oxygen Therapy: Patient Spontanous Breathing and Patient connected to face mask oxygen  Post-op Assessment: Report given to RN and Post -op Vital signs reviewed and stable  Post vital signs: Reviewed and stable  Last Vitals:  Vitals Value Taken Time  BP 134/85 05/10/24 11:30  Temp 36.7 C 05/10/24 11:22  Pulse 76 05/10/24 11:35  Resp 22 05/10/24 11:35  SpO2 97 % 05/10/24 11:35  Vitals shown include unfiled device data.  Last Pain:  Vitals:   05/10/24 0710  TempSrc: Tympanic  PainSc: 0-No pain      Patients Stated Pain Goal: 5 (05/10/24 0710)  Complications: No notable events documented.

## 2024-05-10 NOTE — Anesthesia Preprocedure Evaluation (Addendum)
"                                    Anesthesia Evaluation  Patient identified by MRN, date of birth, ID band Patient awake    Reviewed: Allergy & Precautions, NPO status , Patient's Chart, lab work & pertinent test results  History of Anesthesia Complications (+) PONV and history of anesthetic complications  Airway Mallampati: I  TM Distance: <3 FB Neck ROM: Full    Dental  (+) Teeth Intact, Dental Advisory Given, Partial Upper, Partial Lower   Pulmonary asthma    breath sounds clear to auscultation       Cardiovascular hypertension, Pt. 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)    Abdominal   Peds  Hematology   Anesthesia Other Findings   Reproductive/Obstetrics                              Anesthesia Physical Anesthesia Plan  ASA: 2  Anesthesia Plan: General   Post-op Pain Management:    Induction: Intravenous  PONV Risk Score and Plan: 3 and Ondansetron , Dexamethasone , Treatment may vary due to age or medical condition and TIVA  Airway Management Planned: Oral ETT  Additional Equipment: None  Intra-op Plan:   Post-operative Plan: Extubation in OR  Informed Consent: I have reviewed the patients History and Physical, chart, labs and discussed the procedure including the risks, benefits and alternatives for the proposed anesthesia with the patient or authorized representative who has indicated his/her understanding and acceptance.     Dental advisory given  Plan Discussed with: CRNA  Anesthesia Plan Comments:          Anesthesia Quick Evaluation  "

## 2024-05-10 NOTE — H&P (Signed)
 Cc: Bilateral tympanic membrane perforations, hearing loss, recurrent sinusitis   HPI: The patient is a 69 year old female who returns today for her follow-up evaluation.  The patient has a history of bilateral tympanic membrane perforations and bilateral hearing loss.  She was also seen for recurrent sinusitis.  The patient returns today complaining of increasing hearing difficulty, especially on the left side.  She currently wears bilateral hearing aids.  In addition, she also complains of frequent headaches.  She recently underwent a head CT scan.  The CT scan was negative for any sinus disease.  Currently she denies any fever or visual change.  She also denies any otalgia, otorrhea, or vertigo.   Exam: General: Communicates without difficulty, well nourished, no acute distress. Head: Normocephalic, no evidence injury, no tenderness, facial buttresses intact without stepoff. Face/sinus: No tenderness to palpation and percussion. Facial movement is normal and symmetric. Eyes: PERRL, EOMI. No scleral icterus, conjunctivae clear. Neuro: CN II exam reveals vision grossly intact.  No nystagmus at any point of gaze. Ears: Auricles well formed without lesions.  Ear canals are intact without mass or lesion.  No erythema or edema is appreciated.  The patient has a small right TM perforation.  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  Nose: External evaluation reveals normal support and skin without lesions.  Dorsum is intact.  Anterior rhinoscopy reveals congested mucosa over anterior aspect of inferior turbinates and intact septum.  No purulence noted. Oral:  Oral cavity and oropharynx are intact, symmetric, without erythema or edema.  Mucosa is moist without lesions. Neck: Full range of motion without pain.  There is no significant lymphadenopathy.  No masses palpable.  Thyroid  bed within normal limits to palpation.  Parotid glands and submandibular glands equal bilaterally without mass.   Trachea is midline. Neuro:  CN 2-12 grossly intact.    Her hearing test shows bilateral high-frequency sensorineural hearing loss.  In addition, she also has significant conductive hearing loss on the left side.   Assessment: 1.  The patient has a small right tympanic membrane perforation.  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 2.  Bilateral high-frequency sensorineural hearing loss, secondary to presbycusis.  In addition, she also has left ear conductive hearing loss, secondary to her large tympanic membrane perforation. 3.  Chronic rhinitis with nasal mucosal congestion.  Her recent CT scan did not show any acute or chronic sinusitis.   Plan: 1.  The physical exam findings and the hearing test results are reviewed with the patient. 2.  Continue the use of her hearing aids. 3.  The treatment options of her large left ear tympanic membrane perforation and conductive hearing loss are extensively discussed.  The options include continuing conservative observation versus surgical intervention with left tympanoplasty and temporalis fascia graft.  The risk, benefits, alternatives, and details of the procedure are extensively discussed.  Questions are invited and answered. 4.  Continue with bilateral dry ear precautions. 5.  The patient would like to proceed with the tympanoplasty procedure.  We will schedule the procedure in accordance with the patient's schedule.

## 2024-05-10 NOTE — Op Note (Signed)
 DATE OF PROCEDURE: 05/10/2024  OPERATIVE REPORT    SURGEON:  [name] Moccasin, MD   PREOPERATIVE DIAGNOSIS:  Left tympanic membrane perforation.   POSTOPERATIVE DIAGNOSIS:  Left tympanic membrane perforation.   PROCEDURES PERFORMED: 1. Left transcanal tympanoplasty. 2. Left postauricular temporalis fascia graft harvesting.   ANESTHESIA:  General laryngeal mask anesthesia.   COMPLICATIONS:  None.   ESTIMATED BLOOD LOSS:  Minimal.   INDICATION FOR PROCEDURE:   Michele Newton is a 69 y.o. female with a histort of a large left tympanic membrane perforation and left ear conductive hearing loss.   At the last visit, a nearly 60% TM perforation was noted.  The patient was also noted to have conductive hearing loss secondary to the tympanic membrane perforation.  Based on the above findings, the decision was made for the patient to undergo the above-stated procedures.  The risks, benefits, alternatives, and details of the procedures were discussed with the mother.  Questions were invited and answered.  Informed consent was obtained.   DESCRIPTION OF PROCEDURE:  The patient was taken to the operating room and placed supine on the operating table.  General laryngeal mask anesthesia was induced by the anesthesiologist.  Under the operating microscope, the left ear canal was cleaned of all cerumen.  A rim of fibrotic tissue was removed circumferentially from the perforation.  A standard tympanomeatal flap was elevated in a standard fashion.  No other pathology was noted.   Attention was then focused on obtaining the temporalis fascia graft.  A separate postauricular incision was made.  Incision was carried down to the level of the temporalis fascia.  A 2 x 2 cm temporalis fascia graft was harvested in a standard fashion.  Hemostasis was achieved with Bovie electrocautery.  The surgical site was copiously irrigated.  The incision was closed in layers with 4-0 Vicryl and Dermabond.   Under the operating microscope,  the harvested graft was inserted via the ear canal into the middle ear space.  It was used to cover the TM perforation.  Gelfoam was used to pack the middle ear and ear canal, sandwiching the neotympanum.  Ciprodex  ear drops were applied. Antibiotic ointment was applied to the ear canal.  That concluded the procedure for the patient.  The care of the patient was turned over to the anesthesiologist.  The patient was awakened from anesthesia without difficulty.  She was extubated and transferred to the recovery room in good condition.   OPERATIVE FINDINGS: A 60% left TM perforation was noted.   SPECIMEN:  None.   FOLLOWUP CARE:  The patient will be discharged home once she is awake and alert.  She will follow up in my office in 1 week.

## 2024-05-10 NOTE — Anesthesia Postprocedure Evaluation (Signed)
"   Anesthesia Post Note  Patient: Michele Newton  Procedure(s) Performed: LEFT TYMPANOPLASTY WITH TEMPORALIS FASCIA GRAFT (Left: Ear)     Patient location during evaluation: Phase II Anesthesia Type: General Level of consciousness: awake and alert Pain management: pain level controlled Vital Signs Assessment: post-procedure vital signs reviewed and stable Respiratory status: spontaneous breathing Cardiovascular status: blood pressure returned to baseline Postop Assessment: no apparent nausea or vomiting and adequate PO intake Anesthetic complications: no   No notable events documented.  Last Vitals:  Vitals:   05/10/24 1200 05/10/24 1215  BP: 126/71 132/76  Pulse:  80  Resp:  16  Temp:  (!) 36.4 C  SpO2: 94% 92%    Last Pain:  Vitals:   05/10/24 1300  TempSrc:   PainSc: 2                  Dominick Morella T Colhoun      "

## 2024-05-11 ENCOUNTER — Encounter (HOSPITAL_BASED_OUTPATIENT_CLINIC_OR_DEPARTMENT_OTHER): Payer: Self-pay | Admitting: Otolaryngology

## 2024-05-13 ENCOUNTER — Other Ambulatory Visit: Payer: Self-pay | Admitting: Internal Medicine

## 2024-05-13 NOTE — Telephone Encounter (Signed)
 Requested medications are due for refill today.  unsure  Requested medications are on the active medications list.  yes  Last refill. 03/23/2024  Future visit scheduled.   yes  Notes to clinic.  Medication is historical.     Requested Prescriptions  Pending Prescriptions Disp Refills   PARoxetine  (PAXIL ) 40 MG tablet [Pharmacy Med Name: PAROXETINE  40MG  TABLETS] 135 tablet     Sig: TAKE 1 AND 1/2 TABLETS(60 MG) BY MOUTH EVERY MORNING     Psychiatry:  Antidepressants - SSRI Passed - 05/13/2024  4:19 PM      Passed - Completed PHQ-2 or PHQ-9 in the last 360 days      Passed - Valid encounter within last 6 months    Recent Outpatient Visits           2 weeks ago Urinary frequency   [place] [facility] [place], Angeline ORN, NP   1 month ago Hypertensive urgency   [place] [place], Angeline ORN, NP   2 months ago Essential hypertension   [place] [place], Angeline ORN, NP   2 months ago Burning with urination   [place] [facility] [place], Angeline ORN, NP   4 months ago Encounter for general adult medical examination with abnormal findings   [place] [facility] [place], Angeline ORN, NP

## 2024-05-17 ENCOUNTER — Ambulatory Visit (INDEPENDENT_AMBULATORY_CARE_PROVIDER_SITE_OTHER): Admitting: Otolaryngology

## 2024-05-17 ENCOUNTER — Encounter (INDEPENDENT_AMBULATORY_CARE_PROVIDER_SITE_OTHER): Payer: Self-pay | Admitting: Otolaryngology

## 2024-05-17 ENCOUNTER — Telehealth: Payer: Self-pay

## 2024-05-17 VITALS — BP 145/84 | HR 80 | Ht 66.0 in | Wt 205.0 lb

## 2024-05-17 DIAGNOSIS — H7202 Central perforation of tympanic membrane, left ear: Secondary | ICD-10-CM

## 2024-05-17 DIAGNOSIS — Z09 Encounter for follow-up examination after completed treatment for conditions other than malignant neoplasm: Secondary | ICD-10-CM

## 2024-05-17 NOTE — Progress Notes (Signed)
 Patient ID: Michele Newton, female   DOB: 04/17/1956, 69 y.o.   MRN: [id number]  No issues postop.  The incision is healing well.  The left neotympanum is intact.  Continue dry ear precautions on the left side.  Follow-up in 4 months.

## 2024-05-17 NOTE — Telephone Encounter (Signed)
 Copied from CRM [id number]. Topic: Clinical - Prescription Issue >> May 17, 2024  9:43 AM Treva T wrote: Reason for CRM: Pt calling to check status of medication for increased dose, of taking 60 mg daily. Per pt dose was increased to taking 60 mg daily per provider.   Per pt reports pharmacy has not received new prescription with new dosage indicated, and she needs medication today, as she is now out of medicine.   Medication: PARoxetine  (PAXIL )  Pt is requesting a follow up call with status update, pt is aware of same day follow up call.   Ph. [phone]    [id number] [place], [place] - [address] AT [facility] OF [address] [place] [address] Phone: [phone] Fax: [phone]

## 2024-05-17 NOTE — Telephone Encounter (Signed)
 Spoke with patient who states she has been taking 40 mg every evening. Advised her that the Rx was sent to her pharmacy this morning. No further action needed at this time.

## 2024-07-12 ENCOUNTER — Ambulatory Visit: Admitting: Internal Medicine

## 2024-09-14 ENCOUNTER — Ambulatory Visit (INDEPENDENT_AMBULATORY_CARE_PROVIDER_SITE_OTHER): Admitting: Otolaryngology

## 2024-11-19 ENCOUNTER — Ambulatory Visit

## 2024-11-24 ENCOUNTER — Ambulatory Visit
